# Patient Record
Sex: Male | Born: 1940 | Race: White | Hispanic: No | Marital: Married | Smoking: Former smoker
Health system: Southern US, Community
[De-identification: ages and names within clinical notes are randomized; demographics above are authoritative.]

## PROBLEM LIST (undated history)

## (undated) DIAGNOSIS — E119 Type 2 diabetes mellitus without complications: Secondary | ICD-10-CM

## (undated) DIAGNOSIS — N200 Calculus of kidney: Secondary | ICD-10-CM

## (undated) DIAGNOSIS — Z85828 Personal history of other malignant neoplasm of skin: Secondary | ICD-10-CM

## (undated) DIAGNOSIS — E785 Hyperlipidemia, unspecified: Secondary | ICD-10-CM

## (undated) DIAGNOSIS — G4730 Sleep apnea, unspecified: Secondary | ICD-10-CM

## (undated) DIAGNOSIS — I472 Ventricular tachycardia, unspecified: Secondary | ICD-10-CM

## (undated) DIAGNOSIS — M353 Polymyalgia rheumatica: Secondary | ICD-10-CM

## (undated) DIAGNOSIS — I4729 Other ventricular tachycardia: Secondary | ICD-10-CM

## (undated) DIAGNOSIS — I5022 Chronic systolic (congestive) heart failure: Secondary | ICD-10-CM

## (undated) DIAGNOSIS — Z95810 Presence of automatic (implantable) cardiac defibrillator: Secondary | ICD-10-CM

## (undated) DIAGNOSIS — I2510 Atherosclerotic heart disease of native coronary artery without angina pectoris: Secondary | ICD-10-CM

## (undated) DIAGNOSIS — I255 Ischemic cardiomyopathy: Secondary | ICD-10-CM

## (undated) DIAGNOSIS — I499 Cardiac arrhythmia, unspecified: Secondary | ICD-10-CM

## (undated) DIAGNOSIS — M1990 Unspecified osteoarthritis, unspecified site: Secondary | ICD-10-CM

## (undated) DIAGNOSIS — I4819 Other persistent atrial fibrillation: Secondary | ICD-10-CM

## (undated) DIAGNOSIS — L570 Actinic keratosis: Secondary | ICD-10-CM

## (undated) HISTORY — DX: Hyperlipidemia, unspecified: E78.5

## (undated) HISTORY — DX: Ventricular tachycardia, unspecified: I47.20

## (undated) HISTORY — DX: Personal history of other malignant neoplasm of skin: Z85.828

## (undated) HISTORY — PX: JOINT REPLACEMENT: SHX530

## (undated) HISTORY — DX: Actinic keratosis: L57.0

## (undated) HISTORY — PX: OTHER SURGICAL HISTORY: SHX169

## (undated) HISTORY — DX: Calculus of kidney: N20.0

## (undated) HISTORY — DX: Chronic systolic (congestive) heart failure: I50.22

## (undated) HISTORY — DX: Sleep apnea, unspecified: G47.30

## (undated) HISTORY — DX: Other persistent atrial fibrillation: I48.19

## (undated) HISTORY — DX: Ischemic cardiomyopathy: I25.5

## (undated) HISTORY — DX: Other ventricular tachycardia: I47.29

## (undated) HISTORY — DX: Atherosclerotic heart disease of native coronary artery without angina pectoris: I25.10

## (undated) HISTORY — DX: Polymyalgia rheumatica: M35.3

## (undated) HISTORY — DX: Ventricular tachycardia: I47.2

## (undated) SURGERY — LEFT HEART CATH AND CORONARY ANGIOGRAPHY
Anesthesia: Moderate Sedation

---

## 1969-07-28 DIAGNOSIS — N200 Calculus of kidney: Secondary | ICD-10-CM

## 1969-07-28 HISTORY — DX: Calculus of kidney: N20.0

## 1992-11-27 HISTORY — PX: CORONARY ARTERY BYPASS GRAFT: SHX141

## 2006-03-30 ENCOUNTER — Ambulatory Visit: Payer: Self-pay | Admitting: Unknown Physician Specialty

## 2008-07-29 ENCOUNTER — Ambulatory Visit: Payer: Self-pay

## 2008-08-28 ENCOUNTER — Other Ambulatory Visit: Payer: Self-pay

## 2008-08-28 ENCOUNTER — Ambulatory Visit: Payer: Self-pay | Admitting: General Practice

## 2008-09-02 ENCOUNTER — Ambulatory Visit: Payer: Self-pay | Admitting: General Practice

## 2009-11-27 HISTORY — PX: CATARACT EXTRACTION W/ INTRAOCULAR LENS  IMPLANT, BILATERAL: SHX1307

## 2009-11-27 HISTORY — PX: TOTAL KNEE ARTHROPLASTY: SHX125

## 2009-11-29 ENCOUNTER — Ambulatory Visit: Payer: Self-pay | Admitting: General Practice

## 2009-12-13 ENCOUNTER — Inpatient Hospital Stay: Payer: Self-pay | Admitting: General Practice

## 2010-06-23 ENCOUNTER — Ambulatory Visit: Payer: Self-pay

## 2010-08-08 ENCOUNTER — Ambulatory Visit: Payer: Self-pay | Admitting: General Practice

## 2010-11-27 HISTORY — PX: KNEE ARTHROSCOPY: SHX127

## 2012-02-06 ENCOUNTER — Encounter: Payer: Self-pay | Admitting: *Deleted

## 2012-02-06 ENCOUNTER — Telehealth: Payer: Self-pay | Admitting: Internal Medicine

## 2012-02-06 NOTE — Telephone Encounter (Signed)
New msg Pt called and wanted to know what records he needs to bring to appt on thursday

## 2012-02-06 NOTE — Telephone Encounter (Signed)
I spoke with the patient. He states Dr. Darrold Junker is referring him to Dr. Graciela Husbands. He states he has had stress testing and an echo within the last couple of months. I explained to the patient I would forward this message to Asher Muir to see if she has records on the patient and make sure these are included along with an office note and EKG.

## 2012-02-07 NOTE — Telephone Encounter (Signed)
We have records from [facility] already. He doesn't need to bring anything. If we need something more will get that while he is here.

## 2012-02-08 ENCOUNTER — Encounter: Payer: Self-pay | Admitting: Internal Medicine

## 2012-02-08 ENCOUNTER — Encounter: Payer: Self-pay | Admitting: *Deleted

## 2012-02-08 ENCOUNTER — Ambulatory Visit (INDEPENDENT_AMBULATORY_CARE_PROVIDER_SITE_OTHER): Payer: Medicare Other | Admitting: Internal Medicine

## 2012-02-08 VITALS — BP 110/68 | HR 78 | Ht 70.0 in | Wt 223.0 lb

## 2012-02-08 DIAGNOSIS — I5022 Chronic systolic (congestive) heart failure: Secondary | ICD-10-CM | POA: Insufficient documentation

## 2012-02-08 DIAGNOSIS — I509 Heart failure, unspecified: Secondary | ICD-10-CM

## 2012-02-08 DIAGNOSIS — I5023 Acute on chronic systolic (congestive) heart failure: Secondary | ICD-10-CM | POA: Insufficient documentation

## 2012-02-08 DIAGNOSIS — I255 Ischemic cardiomyopathy: Secondary | ICD-10-CM

## 2012-02-08 DIAGNOSIS — I2510 Atherosclerotic heart disease of native coronary artery without angina pectoris: Secondary | ICD-10-CM

## 2012-02-08 DIAGNOSIS — R002 Palpitations: Secondary | ICD-10-CM | POA: Insufficient documentation

## 2012-02-08 DIAGNOSIS — I2589 Other forms of chronic ischemic heart disease: Secondary | ICD-10-CM

## 2012-02-08 NOTE — Assessment & Plan Note (Signed)
As above   he describes these as regular they are not likely atrial fibrillation but they certainly could be. We will need to follow this.

## 2012-02-08 NOTE — Progress Notes (Signed)
History and Physical  Patient ID: Andres [name] MRN: [id number], SOB: September 25, 1941 71 y.o. Date of Encounter: 02/08/2012, 3:02 PM  Primary Physician: Alonna Buckler, MD, MD Primary Cardiologist: AP Primary Electrophysiologist:  SK  Chief Complaint: ?ICD  History of Present Illness: Andres [name] is a 71 y.o. male with a history of coronary artery disease with bypass surgery undertaken urgently in 1994 following dissection during PCI. He has not had a catheterization since.  He has moderate left ventricular dysfunction and has had ejection fraction of 30-35% over the last year or so. He had a Myoview 2 years ago demonstrating a large scar but no ischemia.  He has class II symptoms. He is short of breath climbing a flight half of stairs; he does not have nocturnal dyspnea orthopnea or peripheral edema. He denies chest pain.  He is to get obstructive sleep apnea.  He has had problems with tachycardia palpitations associated with some shortness of breath but not lightheadedness. These are abrupt in onset as well as offset. He has not had syncope. He does have orthostatic lightheadedness.  He has no history of atrial fibrillation. He denies prior stroke.  QRS duration is short.     Past Medical History  Diagnosis Date  . Hyperlipidemia   . Diabetes mellitus   . Sleep apnea   . Coronary artery disease      Past Surgical History  Procedure Date  . Coronary artery bypass graft     [facility] 1994  . Knee surgery 2011    left knee surgery       Current Outpatient Prescriptions  Medication Sig Dispense Refill  . acetaminophen (TYLENOL) 325 MG tablet Take 325 mg by mouth every 6 (six) hours as needed.      [name] aspirin 325 MG tablet Take 325 mg by mouth daily.      . Cinnamon 500 MG TABS Take by mouth.      . Coenzyme Q10 (CO Q 10 PO) Take 300 mg by mouth daily.      . folic acid (FOLVITE) 1 MG tablet Take 1 mg by mouth daily.      [name] GARLIC PO Take by mouth.      .  Glucosamine-Chondroit-Vit C-Mn (GLUCOSAMINE CHONDR 1500 COMPLX PO) Take by mouth.      [name] ibuprofen (ADVIL,MOTRIN) 200 MG tablet Take 200 mg by mouth every 6 (six) hours as needed.      . insulin regular (NOVOLIN R,HUMULIN R) 100 units/mL injection Inject into the skin 2 (two) times daily before a meal.      . loratadine (CLARITIN) 10 MG tablet Take 10 mg by mouth daily.      . metFORMIN (GLUCOPHAGE) 500 MG tablet Take 1,000 mg by mouth 2 (two) times daily with a meal.       . Multiple Vitamin (MULTIVITAMIN) capsule Take 1 capsule by mouth daily.      . Omega-3 Fatty Acids (FISH OIL) 1000 MG CAPS Take 1,000 mg by mouth 2 (two) times daily.      . predniSONE (STERAPRED UNI-PAK) 10 MG tablet 15 tablet taper      . ramipril (ALTACE) 10 MG capsule Take 10 mg by mouth daily.      . rosuvastatin (CRESTOR) 5 MG tablet Take 5 mg by mouth daily.         Allergies: No Known Allergies   History  Substance Use Topics  . Smoking status: Former Smoker -- 1.0 packs/day for 25 years  Types: Cigarettes    Quit date: 01/09/1988  . Smokeless tobacco: Never Used   Comment: quit 1994  . Alcohol Use: No      Family History  Problem Relation Age of Onset  . Heart attack Father       ROS:  Please see the history of present illness.     All other systems reviewed and negative.   Vital Signs: Blood pressure 110/68, pulse 78, height 5\' 10"  (1.778 m), weight 223 lb (101.152 kg).  PHYSICAL EXAM: General:  Well nourished, obese Caucasian male in no acute distress appearing his stated age  HEENT: normal Lymph: no adenopathy Neck: no JVD Endocrine:  No thryomegaly Vascular: No carotid bruits; FA pulses 2+ bilaterally without bruits Cardiac:  normal S1, S2; RRR; 2/6 murur LLSB Back: without kyphosis/scoliosis, no CVA tenderness Lungs:  clear to auscultation bilaterally, no wheezing, rhonchi or rales Abd: soft, nontender, no hepatomegaly Ext: no edema Musculoskeletal:  No deformities, BUE and BLE  strength normal and equal Skin: warm and dry Neuro:  CNs 2-12 intact, no focal abnormalities noted Psych:  Normal affect   EKG:  Sinus with narrow QRS  Labs:   No results found for this basename: WBC, HGB, HCT, MCV, PLT   No results found for this basename: NA,K,CL,CO2,BUN,CREATININE,CALCIUM,LABALBU,PROT,BILITOT,ALKPHOS,ALT,AST,GLUCOSE in the last 168 hours No results found for this basename: CKTOTAL:4,CKMB:4,TROPONINI:4 in the last 72 hours No results found for this basename: CHOL, HDL, LDLCALC, TRIG   No results found for this basename: DDIMER   BNP No results found for this basename: probnp       ASSESSMENT AND PLAN:

## 2012-02-08 NOTE — Assessment & Plan Note (Signed)
The patient has ischemic cardiomyopathy. He has not undergone catheterization since his bypass with a vein graft 20 years ago and he also had residual 20% disease at that time. I should note that until today, the patient was unaware of prior myocardial infarction as evidenced on his Myoview scan.  However, prior to ICD implantation, there are 3 other issues. The first is he has not been treated with beta blockers or Aldactone. Both of these would be appropriate guidelines directed therapies to see if there is interval improvement in heart muscle performance that might mitigate the need for primary prevention ICD. He knows that this will take 3-6 months wait  .  The second issue is the need to explore revascularization adequacy. To that end we'll pursue catheterization not withstanding a negative Myoview scan 2 years ago. We have discussed risks and benefits. The third issue is more troubling. He has a history of abrupt onset onset palpitations. In the context of his myocardial infarction which is described as a large scar the possibility that this represents ventricular tachycardia must be explored. A little physiological testing to see if he had inducible ventricular tachycardia is appropriate. Will plan to pursue this following catheterization. In the event that it is positive he'll undergo ICD implantation. In the event that it is negative he will continue on medical therapy with reassessment of left ventricular function and to assess later whether he would be appropriately considered for primary prevention ICD. We reviewed the above and extreme detail

## 2012-02-08 NOTE — Patient Instructions (Addendum)
Your physician has requested that you have a cardiac catheterization @ THE [facility] SAME DAY EP STUDY AS WELL PER DR. KLEIN. Cardiac catheterization is used to diagnose and/or treat various heart conditions. Doctors may recommend this procedure for a number of different reasons. The most common reason is to evaluate chest pain. Chest pain can be a symptom of coronary artery disease (CAD), and cardiac catheterization can show whether plaque is narrowing or blocking your heart's arteries. This procedure is also used to evaluate the valves, as well as measure the blood flow and oxygen levels in different parts of your heart. For further information please visit [email or web address] Please follow instruction sheet, as given.  Your physician has recommended you make the following change in your medication: COREG 3.25 MG 1 TABLET TWICE DAILY  Your physician recommends that you schedule a follow-up appointment in: WITH DR. Fidela Juneau    Your physician recommends that you return for lab work in: 02/22/12 FOR PRE CATH LABS TO BE DONE @ [facility] OFFICE

## 2012-02-08 NOTE — Assessment & Plan Note (Signed)
As above; is currently class II.

## 2012-02-12 ENCOUNTER — Telehealth: Payer: Self-pay | Admitting: Internal Medicine

## 2012-02-12 NOTE — Telephone Encounter (Signed)
Pt was to have a heart med , a beta blocker called into [facility] in [place], during office 02-08-12, pls call [phone] when done

## 2012-02-13 ENCOUNTER — Other Ambulatory Visit: Payer: Self-pay | Admitting: *Deleted

## 2012-02-13 MED ORDER — CARVEDILOL 3.125 MG PO TABS: 3.1250 mg | ORAL_TABLET | Freq: Two times a day (BID) | ORAL | Status: DC

## 2012-02-16 ENCOUNTER — Encounter (HOSPITAL_COMMUNITY): Payer: Self-pay | Admitting: Pharmacy Technician

## 2012-02-22 ENCOUNTER — Other Ambulatory Visit: Payer: Medicare Other

## 2012-02-22 DIAGNOSIS — I2510 Atherosclerotic heart disease of native coronary artery without angina pectoris: Secondary | ICD-10-CM

## 2012-02-23 LAB — CBC WITH DIFFERENTIAL/PLATELET
Basos: 1 % (ref 0–3)
Hemoglobin: 15 g/dL (ref 12.6–17.7)
Lymphs: 42 % (ref 14–46)
Monocytes: 8 % (ref 4–13)
Neutrophils Absolute: 4.6 10*3/uL (ref 1.8–7.8)
RBC: 4.8 x10E6/uL (ref 4.14–5.80)

## 2012-02-23 LAB — BASIC METABOLIC PANEL
BUN/Creatinine Ratio: 18 (ref 10–22)
BUN: 22 mg/dL (ref 8–27)
CO2: 20 mmol/L (ref 20–32)
Calcium: 9 mg/dL (ref 8.6–10.2)
Creatinine, Ser: 1.23 mg/dL (ref 0.76–1.27)

## 2012-02-23 LAB — PROTIME-INR
INR: 1 (ref 0.8–1.2)
Prothrombin Time: 10.7 s (ref 9.1–12.0)

## 2012-02-27 ENCOUNTER — Other Ambulatory Visit: Payer: Self-pay | Admitting: Internal Medicine

## 2012-02-27 DIAGNOSIS — I428 Other cardiomyopathies: Secondary | ICD-10-CM

## 2012-02-27 MED ORDER — SODIUM CHLORIDE 0.9 % IR SOLN
80.0000 mg | Status: DC
  Filled 2012-02-27 (×2): qty 2

## 2012-02-27 MED ORDER — CHLORHEXIDINE GLUCONATE 4 % EX LIQD
60.0000 mL | Freq: Once | CUTANEOUS | Status: DC
  Filled 2012-02-27: qty 60

## 2012-02-27 MED ORDER — CEFAZOLIN SODIUM-DEXTROSE 2-3 GM-% IV SOLR
2.0000 g | INTRAVENOUS | Status: DC
  Filled 2012-02-27 (×2): qty 50

## 2012-02-28 ENCOUNTER — Encounter (HOSPITAL_COMMUNITY)
Admission: RE | Disposition: A | Discharge status: Home / Self Care | Payer: Self-pay | Source: Ambulatory Visit | Attending: Cardiovascular Disease

## 2012-02-28 ENCOUNTER — Inpatient Hospital Stay (HOSPITAL_COMMUNITY)
Admission: RE | Admit: 2012-02-28 | Discharge: 2012-03-01 | DRG: 225 | Disposition: A | Discharge status: Home / Self Care | Payer: Medicare Other | Source: Ambulatory Visit | Attending: Cardiovascular Disease | Admitting: Cardiovascular Disease

## 2012-02-28 ENCOUNTER — Encounter (HOSPITAL_COMMUNITY): Payer: Self-pay | Admitting: General Practice

## 2012-02-28 DIAGNOSIS — I472 Ventricular tachycardia, unspecified: Secondary | ICD-10-CM

## 2012-02-28 DIAGNOSIS — I428 Other cardiomyopathies: Secondary | ICD-10-CM

## 2012-02-28 DIAGNOSIS — Z951 Presence of aortocoronary bypass graft: Secondary | ICD-10-CM

## 2012-02-28 DIAGNOSIS — I2510 Atherosclerotic heart disease of native coronary artery without angina pectoris: Secondary | ICD-10-CM

## 2012-02-28 DIAGNOSIS — I4729 Other ventricular tachycardia: Secondary | ICD-10-CM

## 2012-02-28 DIAGNOSIS — Z794 Long term (current) use of insulin: Secondary | ICD-10-CM

## 2012-02-28 DIAGNOSIS — Z8249 Family history of ischemic heart disease and other diseases of the circulatory system: Secondary | ICD-10-CM

## 2012-02-28 DIAGNOSIS — E119 Type 2 diabetes mellitus without complications: Secondary | ICD-10-CM | POA: Diagnosis present

## 2012-02-28 DIAGNOSIS — Z01812 Encounter for preprocedural laboratory examination: Secondary | ICD-10-CM

## 2012-02-28 DIAGNOSIS — Z87891 Personal history of nicotine dependence: Secondary | ICD-10-CM

## 2012-02-28 DIAGNOSIS — I5022 Chronic systolic (congestive) heart failure: Secondary | ICD-10-CM | POA: Diagnosis present

## 2012-02-28 DIAGNOSIS — I2589 Other forms of chronic ischemic heart disease: Principal | ICD-10-CM | POA: Diagnosis present

## 2012-02-28 DIAGNOSIS — G4733 Obstructive sleep apnea (adult) (pediatric): Secondary | ICD-10-CM | POA: Diagnosis present

## 2012-02-28 DIAGNOSIS — I4891 Unspecified atrial fibrillation: Secondary | ICD-10-CM | POA: Diagnosis present

## 2012-02-28 DIAGNOSIS — E785 Hyperlipidemia, unspecified: Secondary | ICD-10-CM | POA: Diagnosis present

## 2012-02-28 DIAGNOSIS — I509 Heart failure, unspecified: Secondary | ICD-10-CM | POA: Diagnosis present

## 2012-02-28 HISTORY — PX: LEFT HEART CATHETERIZATION WITH CORONARY/GRAFT ANGIOGRAM: SHX5450

## 2012-02-28 HISTORY — PX: CARDIAC CATHETERIZATION: SHX172

## 2012-02-28 HISTORY — DX: Presence of automatic (implantable) cardiac defibrillator: Z95.810

## 2012-02-28 HISTORY — PX: ELECTROPHYSIOLOGY STUDY: SHX5467

## 2012-02-28 HISTORY — PX: INSERT / REPLACE / REMOVE PACEMAKER: SUR710

## 2012-02-28 HISTORY — DX: Type 2 diabetes mellitus without complications: E11.9

## 2012-02-28 LAB — CBC
HCT: 42.1 % (ref 39.0–52.0)
RBC: 4.51 MIL/uL (ref 4.22–5.81)
RDW: 14.9 % (ref 11.5–15.5)
WBC: 9.3 10*3/uL (ref 4.0–10.5)

## 2012-02-28 LAB — GLUCOSE, CAPILLARY
Glucose-Capillary: 192 mg/dL — ABNORMAL HIGH (ref 70–99)
Glucose-Capillary: 197 mg/dL — ABNORMAL HIGH (ref 70–99)

## 2012-02-28 LAB — BASIC METABOLIC PANEL
BUN: 19 mg/dL (ref 6–23)
CO2: 25 mEq/L (ref 19–32)
Chloride: 103 mEq/L (ref 96–112)
GFR calc Af Amer: 71 mL/min — ABNORMAL LOW (ref >90)
Potassium: 4.3 mEq/L (ref 3.5–5.1)

## 2012-02-28 LAB — SURGICAL PCR SCREEN: MRSA, PCR: NEGATIVE

## 2012-02-28 SURGERY — ELECTROPHYSIOLOGY STUDY
Anesthesia: LOCAL

## 2012-02-28 SURGERY — LEFT HEART CATHETERIZATION WITH CORONARY/GRAFT ANGIOGRAM
Anesthesia: LOCAL

## 2012-02-28 MED ORDER — EZETIMIBE 10 MG PO TABS
10.0000 mg | ORAL_TABLET | Freq: Every day | ORAL | Status: DC
  Administered 2012-02-28 – 2012-03-01 (×3): 10 mg via ORAL
  Filled 2012-02-28 (×3): qty 1

## 2012-02-28 MED ORDER — FENTANYL CITRATE 0.05 MG/ML IJ SOLN
INTRAMUSCULAR | Status: AC
  Filled 2012-02-28: qty 2

## 2012-02-28 MED ORDER — ATORVASTATIN CALCIUM 40 MG PO TABS
40.0000 mg | ORAL_TABLET | Freq: Every day | ORAL | Status: DC
  Administered 2012-02-28 – 2012-02-29 (×2): 40 mg via ORAL
  Filled 2012-02-28 (×3): qty 1

## 2012-02-28 MED ORDER — LIDOCAINE HCL (PF) 1 % IJ SOLN
INTRAMUSCULAR | Status: AC
  Filled 2012-02-28: qty 30

## 2012-02-28 MED ORDER — INSULIN ASPART 100 UNIT/ML ~~LOC~~ SOLN
17.0000 [IU] | Freq: Every day | SUBCUTANEOUS | Status: DC
  Administered 2012-02-29 – 2012-03-01 (×2): 17 [IU] via SUBCUTANEOUS

## 2012-02-28 MED ORDER — PREDNISONE (PAK) 10 MG PO TABS: 15.0000 mg | ORAL_TABLET | Freq: Every day | ORAL | Status: DC

## 2012-02-28 MED ORDER — LIDOCAINE HCL (PF) 1 % IJ SOLN
INTRAMUSCULAR | Status: AC
  Filled 2012-02-28: qty 60

## 2012-02-28 MED ORDER — SODIUM CHLORIDE 0.9 % IV SOLN
INTRAVENOUS | Status: DC
  Administered 2012-02-28: 1000 mL via INTRAVENOUS

## 2012-02-28 MED ORDER — INSULIN REGULAR HUMAN 100 UNIT/ML IJ SOLN: 7.0000 [IU] | Freq: Two times a day (BID) | INTRAMUSCULAR | Status: DC

## 2012-02-28 MED ORDER — SODIUM CHLORIDE 0.9 % IV SOLN: INTRAVENOUS | Status: DC

## 2012-02-28 MED ORDER — ONDANSETRON HCL 4 MG/2ML IJ SOLN: 4.0000 mg | Freq: Four times a day (QID) | INTRAMUSCULAR | Status: DC | PRN

## 2012-02-28 MED ORDER — PREDNISONE 20 MG PO TABS
20.0000 mg | ORAL_TABLET | Freq: Every day | ORAL | Status: DC
  Filled 2012-02-28 (×2): qty 1

## 2012-02-28 MED ORDER — HEPARIN (PORCINE) IN NACL 2-0.9 UNIT/ML-% IJ SOLN
INTRAMUSCULAR | Status: AC
  Filled 2012-02-28: qty 2000

## 2012-02-28 MED ORDER — MUPIROCIN 2 % EX OINT
TOPICAL_OINTMENT | Freq: Two times a day (BID) | CUTANEOUS | Status: DC
  Administered 2012-02-28: 1 via NASAL
  Administered 2012-02-28: 23:00:00 via NASAL
  Administered 2012-02-29: 1 via NASAL
  Administered 2012-02-29 – 2012-03-01 (×2): via NASAL
  Filled 2012-02-28 (×2): qty 22

## 2012-02-28 MED ORDER — MUPIROCIN 2 % EX OINT
TOPICAL_OINTMENT | CUTANEOUS | Status: AC
  Administered 2012-02-28: 1 via NASAL
  Filled 2012-02-28: qty 22

## 2012-02-28 MED ORDER — CEFAZOLIN SODIUM 1-5 GM-% IV SOLN
1.0000 g | Freq: Four times a day (QID) | INTRAVENOUS | Status: AC
Start: 2012-02-28 — End: 2012-02-29
  Administered 2012-02-28 – 2012-02-29 (×3): 1 g via INTRAVENOUS
  Filled 2012-02-28 (×3): qty 50

## 2012-02-28 MED ORDER — FOLIC ACID 1 MG PO TABS
1.0000 mg | ORAL_TABLET | Freq: Every day | ORAL | Status: DC
  Administered 2012-02-28 – 2012-03-01 (×3): 1 mg via ORAL
  Filled 2012-02-28 (×3): qty 1

## 2012-02-28 MED ORDER — LORATADINE 10 MG PO TABS
10.0000 mg | ORAL_TABLET | Freq: Every day | ORAL | Status: DC
  Administered 2012-02-28 – 2012-03-01 (×3): 10 mg via ORAL
  Filled 2012-02-28 (×3): qty 1

## 2012-02-28 MED ORDER — SODIUM CHLORIDE 0.9 % IV SOLN: 250.0000 mL | INTRAVENOUS | Status: DC | PRN

## 2012-02-28 MED ORDER — MIDAZOLAM HCL 5 MG/5ML IJ SOLN
INTRAMUSCULAR | Status: AC
  Filled 2012-02-28: qty 5

## 2012-02-28 MED ORDER — NITROGLYCERIN 0.2 MG/ML ON CALL CATH LAB
INTRAVENOUS | Status: AC
  Filled 2012-02-28: qty 1

## 2012-02-28 MED ORDER — SODIUM CHLORIDE 0.9 % IJ SOLN: 3.0000 mL | INTRAMUSCULAR | Status: DC | PRN

## 2012-02-28 MED ORDER — CARVEDILOL 3.125 MG PO TABS
3.1250 mg | ORAL_TABLET | Freq: Two times a day (BID) | ORAL | Status: DC
  Administered 2012-02-28 – 2012-02-29 (×2): 3.125 mg via ORAL
  Filled 2012-02-28 (×4): qty 1

## 2012-02-28 MED ORDER — ACETAMINOPHEN 325 MG PO TABS: 650.0000 mg | ORAL_TABLET | ORAL | Status: DC | PRN

## 2012-02-28 MED ORDER — ASPIRIN 325 MG PO TABS
325.0000 mg | ORAL_TABLET | Freq: Every day | ORAL | Status: DC
  Administered 2012-02-28 – 2012-02-29 (×2): 325 mg via ORAL
  Filled 2012-02-28 (×2): qty 1

## 2012-02-28 MED ORDER — SODIUM CHLORIDE 0.9 % IJ SOLN: 3.0000 mL | Freq: Two times a day (BID) | INTRAMUSCULAR | Status: DC

## 2012-02-28 MED ORDER — INSULIN ASPART 100 UNIT/ML ~~LOC~~ SOLN
7.0000 [IU] | Freq: Every day | SUBCUTANEOUS | Status: DC
  Administered 2012-02-28 – 2012-02-29 (×2): 7 [IU] via SUBCUTANEOUS

## 2012-02-28 MED ORDER — PREDNISONE 10 MG PO TABS
10.0000 mg | ORAL_TABLET | Freq: Every day | ORAL | Status: DC
  Administered 2012-02-29 – 2012-03-01 (×2): 10 mg via ORAL
  Filled 2012-02-28 (×3): qty 1

## 2012-02-28 MED ORDER — MIDAZOLAM HCL 2 MG/2ML IJ SOLN
INTRAMUSCULAR | Status: AC
  Filled 2012-02-28: qty 2

## 2012-02-28 MED ORDER — RAMIPRIL 10 MG PO CAPS
10.0000 mg | ORAL_CAPSULE | Freq: Every day | ORAL | Status: DC
  Administered 2012-02-28 – 2012-03-01 (×3): 10 mg via ORAL
  Filled 2012-02-28 (×3): qty 1

## 2012-02-28 MED ORDER — ASPIRIN 81 MG PO CHEW: 324.0000 mg | CHEWABLE_TABLET | ORAL | Status: DC

## 2012-02-28 MED ORDER — ACETAMINOPHEN 325 MG PO TABS: 325.0000 mg | ORAL_TABLET | ORAL | Status: DC | PRN

## 2012-02-28 MED ORDER — SODIUM CHLORIDE 0.9 % IV SOLN
INTRAVENOUS | Status: AC
  Administered 2012-02-28: 15:00:00 via INTRAVENOUS

## 2012-02-28 NOTE — CV Procedure (Signed)
    Cardiac Catheterization Procedure Note  Name: Andres [name] MRN: [id number] DOB: [birth year]  Procedure: Left Heart Cath, Selective Coronary Angiography, LV angiography  Indication: Dyspnea, cardiomyopathy and congestive heart failure.   Medications:  Sedation:  1 mg IV Versed, 25 mcg IV Fentanyl  Contrast:  80 ML Omnipaque  Procedural details: The right groin was prepped, draped, and anesthetized with 1% lidocaine. Using modified Seldinger technique, a 5 French sheath was introduced into the right femoral artery. Standard Judkins catheters were used for coronary angiography and left ventriculography. Catheter exchanges were performed over a guidewire. There were no immediate procedural complications. The patient was transferred to the post catheterization recovery area for further monitoring.   Procedural Findings:  Hemodynamics: AO:  110/59   mmHg LV:  113/7    mmHg LVEDP: 15  mmHg  Coronary angiography: Coronary dominance: Right   Left Main:  Normal in size and short. It is free of significant disease.  Left Anterior Descending (LAD):  Normal in size with minor irregularities throughout its course without evidence of obstructive disease.  1st diagonal (D1):  Large in size and gives a second branch that supplies a second diagonal distribution. In the lower branch there is a 40% ostial lesion.*  Circumflex (LCx):  Normal in size and nondominant. There is mild atherosclerosis proximally. OM1 is normal in size with minor irregularities. Right after OM1 is a 70% stenosis. The distal circumflex appears to be occluded but is supplied by a patent SVG graft.  Right Coronary Artery: Normal in size and dominant. It has minor irregularities without evidence of obstructive disease.  posterior descending artery: Normal in size with minor irregularities.  posterior lateral branch:  No obstructive disease.  SVG to OM 3: Is patent with mild 20% proximal disease. There is a 40%  stenosis in the mid segment of the graft but otherwise no evidence of obstructive disease. This graft also supplies OM 2 and retrograde fashion.  Left ventriculography: Left ventricular systolic function is severely reduced , LVEF is estimated at 25 %, there is no significant mitral regurgitation   Final Conclusions:  1. Significant 1 vessel coronary artery disease with patent SVG to OM. 2. 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 Ejection fraction is estimated to be 25%. 3. Mildly elevated left ventricular end-diastolic pressure.  Recommendations:  Continue medical therapy for heart failure and CAD. Proceed with evaluation for an ICD placement.  Lorine Bears MD, [facility] 02/28/2012, 11:07 AM

## 2012-02-28 NOTE — H&P (View-Only) (Signed)
History and Physical  Patient ID: Andres [name] MRN: [id number], SOB: [birth year] 71 y.o. Date of Encounter: 02/08/2012, 3:02 PM  Primary Physician: Alonna Buckler, MD, MD Primary Cardiologist: AP Primary Electrophysiologist:  SK  Chief Complaint: ?ICD  History of Present Illness: Andres [name] is a 71 y.o. male with a history of coronary artery disease with bypass surgery undertaken urgently in 1994 following dissection during PCI. He has not had a catheterization since.  He has moderate left ventricular dysfunction and has had ejection fraction of 30-35% over the last year or so. He had a Myoview 2 years ago demonstrating a large scar but no ischemia.  He has class II symptoms. He is short of breath climbing a flight half of stairs; he does not have nocturnal dyspnea orthopnea or peripheral edema. He denies chest pain.  He is to get obstructive sleep apnea.  He has had problems with tachycardia palpitations associated with some shortness of breath but not lightheadedness. These are abrupt in onset as well as offset. He has not had syncope. He does have orthostatic lightheadedness.  He has no history of atrial fibrillation. He denies prior stroke.  QRS duration is short.     Past Medical History  Diagnosis Date  . Hyperlipidemia   . Diabetes mellitus   . Sleep apnea   . Coronary artery disease      Past Surgical History  Procedure Date  . Coronary artery bypass graft     [facility] 1994  . Knee surgery 2011    left knee surgery       Current Outpatient Prescriptions  Medication Sig Dispense Refill  . acetaminophen (TYLENOL) 325 MG tablet Take 325 mg by mouth every 6 (six) hours as needed.      [name] aspirin 325 MG tablet Take 325 mg by mouth daily.      . Cinnamon 500 MG TABS Take by mouth.      . Coenzyme Q10 (CO Q 10 PO) Take 300 mg by mouth daily.      . folic acid (FOLVITE) 1 MG tablet Take 1 mg by mouth daily.      [name] GARLIC PO Take by mouth.      .  Glucosamine-Chondroit-Vit C-Mn (GLUCOSAMINE CHONDR 1500 COMPLX PO) Take by mouth.      [name] ibuprofen (ADVIL,MOTRIN) 200 MG tablet Take 200 mg by mouth every 6 (six) hours as needed.      . insulin regular (NOVOLIN R,HUMULIN R) 100 units/mL injection Inject into the skin 2 (two) times daily before a meal.      . loratadine (CLARITIN) 10 MG tablet Take 10 mg by mouth daily.      . metFORMIN (GLUCOPHAGE) 500 MG tablet Take 1,000 mg by mouth 2 (two) times daily with a meal.       . Multiple Vitamin (MULTIVITAMIN) capsule Take 1 capsule by mouth daily.      . Omega-3 Fatty Acids (FISH OIL) 1000 MG CAPS Take 1,000 mg by mouth 2 (two) times daily.      . predniSONE (STERAPRED UNI-PAK) 10 MG tablet 15 tablet taper      . ramipril (ALTACE) 10 MG capsule Take 10 mg by mouth daily.      . rosuvastatin (CRESTOR) 5 MG tablet Take 5 mg by mouth daily.         Allergies: No Known Allergies   History  Substance Use Topics  . Smoking status: Former Smoker -- 1.0 packs/day for 25 years  Types: Cigarettes    Quit date: 01/09/1988  . Smokeless tobacco: Never Used   Comment: quit 1994  . Alcohol Use: No      Family History  Problem Relation Age of Onset  . Heart attack Father       ROS:  Please see the history of present illness.     All other systems reviewed and negative.   Vital Signs: Blood pressure 110/68, pulse 78, height 5\' 10"  (1.778 m), weight 223 lb (101.152 kg).  PHYSICAL EXAM: General:  Well nourished, obese Caucasian male in no acute distress appearing his stated age  HEENT: normal Lymph: no adenopathy Neck: no JVD Endocrine:  No thryomegaly Vascular: No carotid bruits; FA pulses 2+ bilaterally without bruits Cardiac:  normal S1, S2; RRR; 2/6 murur LLSB Back: without kyphosis/scoliosis, no CVA tenderness Lungs:  clear to auscultation bilaterally, no wheezing, rhonchi or rales Abd: soft, nontender, no hepatomegaly Ext: no edema Musculoskeletal:  No deformities, BUE and BLE  strength normal and equal Skin: warm and dry Neuro:  CNs 2-12 intact, no focal abnormalities noted Psych:  Normal affect   EKG:  Sinus with narrow QRS  Labs:   No results found for this basename: WBC, HGB, HCT, MCV, PLT   No results found for this basename: NA,K,CL,CO2,BUN,CREATININE,CALCIUM,LABALBU,PROT,BILITOT,ALKPHOS,ALT,AST,GLUCOSE in the last 168 hours No results found for this basename: CKTOTAL:4,CKMB:4,TROPONINI:4 in the last 72 hours No results found for this basename: CHOL, HDL, LDLCALC, TRIG   No results found for this basename: DDIMER   BNP No results found for this basename: probnp       ASSESSMENT AND PLAN:

## 2012-02-28 NOTE — Interval H&P Note (Signed)
History and Physical Interval Note:  02/28/2012 11:52 AM  Andres [name]  has presented today for surgery, with the diagnosis of cad  The various methods of treatment have been discussed with the patient and family. After consideration of risks, benefits and other options for treatment, the patient has consented to  Procedure(s) (LRB): ELECTROPHYSIOLOGY STUDY (N/A) as a surgical intervention .  The patients' history has been reviewed, patient examined, no change in status, stable for surgery.  I have reviewed the patients' chart and labs.  Questions were answered to the patient's satisfaction.     Sherryl Manges  Cath non obstructive  EF 25 % Foe EPS and +/- ICD

## 2012-02-28 NOTE — Interval H&P Note (Signed)
History and Physical Interval Note:  02/28/2012 10:38 AM  Andres [name]  has presented today for surgery, with the diagnosis of Chest pain  The various methods of treatment have been discussed with the patient and family. After consideration of risks, benefits and other options for treatment, the patient has consented to  Procedure(s) (LRB): LEFT HEART CATHETERIZATION WITH CORONARY/GRAFT ANGIOGRAM (N/A) as a surgical intervention .  The patients' history has been reviewed, patient examined, no change in status, stable for surgery.  I have reviewed the patients' chart and labs.  Questions were answered to the patient's satisfaction.     Lorine Bears

## 2012-02-28 NOTE — CV Procedure (Signed)
EPS dictation number [id number]  ICD dictation number [phone]

## 2012-02-29 ENCOUNTER — Other Ambulatory Visit: Payer: Self-pay

## 2012-02-29 ENCOUNTER — Ambulatory Visit (HOSPITAL_COMMUNITY): Payer: Medicare Other

## 2012-02-29 ENCOUNTER — Encounter (HOSPITAL_COMMUNITY): Payer: Self-pay | Admitting: Internal Medicine

## 2012-02-29 DIAGNOSIS — I428 Other cardiomyopathies: Secondary | ICD-10-CM

## 2012-02-29 LAB — GLUCOSE, CAPILLARY
Glucose-Capillary: 210 mg/dL — ABNORMAL HIGH (ref 70–99)
Glucose-Capillary: 336 mg/dL — ABNORMAL HIGH (ref 70–99)

## 2012-02-29 MED ORDER — ASPIRIN 325 MG PO TABS
162.0000 mg | ORAL_TABLET | Freq: Every day | ORAL | Status: DC
  Administered 2012-03-01: 162 mg via ORAL
  Filled 2012-02-29: qty 0.5

## 2012-02-29 MED ORDER — CARVEDILOL 6.25 MG PO TABS
6.2500 mg | ORAL_TABLET | Freq: Two times a day (BID) | ORAL | Status: DC
  Administered 2012-02-29 – 2012-03-01 (×2): 6.25 mg via ORAL
  Filled 2012-02-29 (×4): qty 1

## 2012-02-29 MED ORDER — METOPROLOL TARTRATE 1 MG/ML IV SOLN
5.0000 mg | INTRAVENOUS | Status: AC
  Administered 2012-02-29 (×2): 5 mg via INTRAVENOUS

## 2012-02-29 MED ORDER — METOPROLOL TARTRATE 1 MG/ML IV SOLN
INTRAVENOUS | Status: AC
  Administered 2012-02-29: 5 mg via INTRAVENOUS
  Filled 2012-02-29: qty 10

## 2012-02-29 NOTE — Op Note (Signed)
NAME:  Andres [name], Andres [name] NO.:  [id number]  MEDICAL RECORD NO.:  [id number]  LOCATION:  2020                         FACILITY:  [facility]  PHYSICIAN:  [name], MD, FACCDATE OF BIRTH:  Oct 28, 1941  DATE OF PROCEDURE: DATE OF DISCHARGE:                              OPERATIVE REPORT   PREOPERATIVE DIAGNOSES:  Ischemic cardiomyopathy.  Symptomatic tachy- palpitations.  POSTOPERATIVE DIAGNOSIS:  Inducible ventricular tachycardia.  PROCEDURE:  Invasive electrophysiological study.  DESCRIPTION OF PROCEDURE:  Following obtaining informed consent, the patient was brought to the electrophysiology laboratory and placed on the fluoroscopic table in a supine position.  After routine prep and drape, cardiac catheterization was performed via the left femoral vein. Following the procedure, the catheters were removed,  sheaths were removed, and the patient was prepared for ICD implantation in stable condition.  CATHETERS:  A 5-French Quadripolar catheter was inserted via the left femoral vein to the AV junction. A 5-French Quadripolar catheter was inserted via the left femoral vein to the right ventricular apex and the high right atrium.  Following insertion of the catheters, a stimulation protocol included: Incremental atrial pacing. Incremental ventricular pacing. Single ventricular extrastimuli at paced cycle length of 400:600 msec. Double and triple extrastimuli at paced cycle length of 550 msec and double extrastimuli paced cycle length of 400 msec from the right ventricular apex.  Surface leads I, aVF, and V1 were monitored continuously throughout the procedure.  RESULTS:  Rhythm:  Sinus.  The basic intervals are not available at the time of this dictation, I do recall that the H interval was 150 msec and the HV interval was 35 msec.  AV Wenckebach was 480 msec.  A-V nodal effective refractory period was 600 msec and 380 msec.  VA conduction was dissociated  at 600 msec.  Ventricular response to programed stimulation:  The ventricular effective refractory period was 550 msec, 640 msec, 400 msec, was 400 msec.  The closest coupling interval at the right ventricular apex of 550 msec was 260:210:210.  Arrhythmias induced the patient was induced in ventricular tachycardia that required shock termination.  It was accomplished at the aforementioned closest coupling interval, had a right bundle northwest axis morphology.  Cycle length was 220-240 msec.  IMPRESSION: 1. Normal sinus function. 2. Normal atrial function. 3. Normal atrioventricular nodal function. 4. Normal His-Purkinje system function. 5. No accessory pathway. 6. Easily inducible ventricular tachycardia in the setting of ischemic     cardiomyopathy.  Based on the fact the patient has symptomatic tachy-palpitations and inducible ventricular tachycardia, he will be admitted for ICD implantation.     [name], MD, [facility]     SCK/MEDQ  D:  02/28/2012  T:  02/29/2012  Job:  [id number]  cc:   Marcina Millard, M.D.

## 2012-02-29 NOTE — Progress Notes (Addendum)
Received a call from RN stating the patient went into atrial fibrillation. Reviewed telemetry and EKG which show A.Fib with rates 90-130s. Currently 100-120 with stable BP. No ICD shocks. Patient denies chest pain, sob, or palpitations, but states he feels somewhat hot. This is similar to what he has felt in the past. Discussed with Dr. Graciela Husbands. Will give 5mg  IV metoprolol now and repeat in 15 min for rate control. If he does not spontaneously convert overnight he will need cardioversion tomorrow. Will make NPO after midnight. Discussed with patient and family who are agreeable to the plan. Updated RN.  Ryleigh Esqueda PA-C 02/29/2012 5:15 PM

## 2012-02-29 NOTE — Op Note (Signed)
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  PROCEDURE:  Implantation of defibrillator with intraoperative defibrillation threshold testing.  Following EP study as dictated under separate cover, the patient was then prepped for ICD implantation given the patient's inducible ventricular tachycardia.  After routine prep and drape of the left upper chest, lidocaine was infiltrated in prepectoral subclavicular region.  An incision was made and carried down to the layer of the prepectoral fascia.  Using electrocautery and sharp dissection, a pocket was formed similarly. Hemostasis was obtained.  Thereafter, attention was turned to gain access to the extrathoracic left subclavian vein, which was accomplished without difficulty without the aspiration or puncture of the artery.  A venipuncture was accomplished.  An 8-French sheath was placed through which was then passed a [employer] Durata [id number] defibrillator lead, serial number [id number].  Under fluoroscopic guidance, it was manipulated to the right ventricular apex where the bipolar R-wave was 12.7 with a pace impedance of 887 and threshold of 1.1 V at 0.5 msec.  Current of threshold was 1.3 mA.  There was no diaphragmatic pacing at 10 V.  The current of injury was moderate.  The lead was then attached to a [employer] Fortify Assura device, model number [id number], serial number [id number].  Through the device, the bipolar R-wave was 11.7 with a pace impedance of 830, a threshold of 0.75 at 0.5 with  high-voltage impedance of 75 ohms.  The device was implanted.  The pocket was copiously irrigated with antibiotic-containing saline solution.  Hemostasis was assured.  The leads and pulse generator were placed in the pocket and secured to the prepectoral fascia.  The wound was closed in 3 layers in normal fashion. The wound was washed, dried, and a benzoin and Steri-Strip dressing was applied.  At this point, I stepped away and we then undertook defibrillation threshold testing.  Ventricular fibrillation was induced via the T-wave shock.  After total duration of 6 seconds, a 16.1 joule shock was delivered through measured resistance of 79 ohms, terminating ventricular fibrillation and restoring sinus rhythm.  The patient tolerated the procedure well without apparent complications.     [name], MD, [facility]     SCK/MEDQ  D:  02/28/2012  T:  02/29/2012  Job:  [id number]

## 2012-02-29 NOTE — Progress Notes (Signed)
   ELECTROPHYSIOLOGY ROUNDING NOTE    Patient Name: Andres [name] Date of Encounter: 02-29-2012    SUBJECTIVE:Patient feels well.  No chest pain or shortness of breath.  No incisional pain.  Status post EPS and ICD implantation 02-28-2012.  TELEMETRY: Reviewed telemetry pt in sinus rhythm Filed Vitals:   02/28/12 1430 02/28/12 1648 02/28/12 2031 02/29/12 0434  BP: 112/68 110/69 117/71 113/75  Pulse: 70 78 98 70  Temp: 98.2 F (36.8 C)  98.2 F (36.8 C) 97.2 F (36.2 C)  TempSrc: Oral  Oral Axillary  Resp: 16  18 18   Height:      Weight:      SpO2: 98%  93% 93%   Well developed and nourished in no acute distress HENT normal Neck supple with JVP-flat Clear Pocket without hematoma Regular rate and rhythm, no murmurs or rubs Abd-soft with active BS No Clubbing cyanosis edema Skin-warm and dry A & Oriented  Grossly normal sensory and motor function   Intake/Output Summary (Last 24 hours) at 02/29/12 0746 Last data filed at 02/28/12 2200  Gross per 24 hour  Intake    720 ml  Output   1201 ml  Net   -481 ml    LABS: Basic Metabolic Panel:  Basename 02/28/12 0728  NA 137  K 4.3  CL 103  CO2 25  GLUCOSE 199*  BUN 19  CREATININE 1.16  CALCIUM 8.9  MG --  PHOS --   CBC:  Basename 02/28/12 0729  WBC 9.3  NEUTROABS --  HGB 14.0  HCT 42.1  MCV 93.3  PLT 240    Radiology/Studies:   Tel: VT NS   CXR not yet done    DEVICE INTERROGATION: Device interrogated by industry.  Lead values including impedence, sensing, threshold within normal values.    Wound care, arm mobility, shock plan reviewed with patient.  Plan wound check and 3 month check with Dr Graciela Husbands in [place].  VTNS will follow for a few hours, may be related to lead position    Discharge plans  Increase coreg>>6.25 Decrease ASA 162

## 2012-02-29 NOTE — Progress Notes (Signed)
Inpatient Diabetes Program Recommendations  AACE/ADA: New Consensus Statement on Inpatient Glycemic Control  Target Ranges:  Prepandial:   less than 140 mg/dL      Peak postprandial:   less than 180 mg/dL (1-2 hours)      Critically ill patients:  140 - 180 mg/dL  Pager:  [phone] Hours:  8 am-10pm   Reason for Visit: Elevated glucose:  Results for Andres [name], Andres [name] (MRN [id number]) as of 02/29/2012 11:00  Ref. Range 02/28/2012 06:44 02/28/2012 11:11 02/29/2012 06:58  Glucose-Capillary Latest Range: 70-99 mg/dL 147 (H) 829 (H) 562 (H)    Inpatient Diabetes Program Recommendations Insulin - Basal: May consider basal insulin if fasting glucose remains elevated Correction (SSI): Add Novolog Correction

## 2012-03-01 ENCOUNTER — Encounter (HOSPITAL_COMMUNITY): Payer: Self-pay | Admitting: Anesthesiology

## 2012-03-01 ENCOUNTER — Encounter (HOSPITAL_COMMUNITY)
Admission: RE | Disposition: A | Discharge status: Home / Self Care | Payer: Self-pay | Source: Ambulatory Visit | Attending: Cardiovascular Disease

## 2012-03-01 ENCOUNTER — Other Ambulatory Visit: Payer: Self-pay

## 2012-03-01 ENCOUNTER — Inpatient Hospital Stay (HOSPITAL_COMMUNITY): Payer: Medicare Other | Admitting: Anesthesiology

## 2012-03-01 DIAGNOSIS — I4891 Unspecified atrial fibrillation: Secondary | ICD-10-CM

## 2012-03-01 HISTORY — PX: CARDIOVERSION: SHX1299

## 2012-03-01 LAB — GLUCOSE, CAPILLARY: Glucose-Capillary: 216 mg/dL — ABNORMAL HIGH (ref 70–99)

## 2012-03-01 SURGERY — CARDIOVERSION
Anesthesia: General | Wound class: Clean

## 2012-03-01 MED ORDER — SODIUM CHLORIDE 0.9 % IJ SOLN
3.0000 mL | Freq: Two times a day (BID) | INTRAMUSCULAR | Status: DC
  Administered 2012-03-01: 3 mL via INTRAVENOUS

## 2012-03-01 MED ORDER — SODIUM CHLORIDE 0.9 % IV SOLN
250.0000 mL | INTRAVENOUS | Status: DC
  Administered 2012-03-01: 250 mL via INTRAVENOUS

## 2012-03-01 MED ORDER — HYDROCORTISONE 1 % EX CREA
1.0000 "application " | TOPICAL_CREAM | Freq: Three times a day (TID) | CUTANEOUS | Status: DC | PRN
  Filled 2012-03-01: qty 28

## 2012-03-01 MED ORDER — CARVEDILOL 3.125 MG PO TABS: 6.2500 mg | ORAL_TABLET | Freq: Two times a day (BID) | ORAL | Status: DC

## 2012-03-01 MED ORDER — METFORMIN HCL 1000 MG PO TABS: 1000.0000 mg | ORAL_TABLET | Freq: Two times a day (BID) | ORAL | Status: DC

## 2012-03-01 MED ORDER — SODIUM CHLORIDE 0.9 % IJ SOLN: 3.0000 mL | INTRAMUSCULAR | Status: DC | PRN

## 2012-03-01 MED ORDER — PROPOFOL 10 MG/ML IV EMUL
INTRAVENOUS | Status: DC | PRN
  Administered 2012-03-01: 50 mg via INTRAVENOUS

## 2012-03-01 MED ORDER — ASPIRIN 81 MG PO TABS: 162.0000 mg | ORAL_TABLET | Freq: Every day | ORAL | Status: DC

## 2012-03-01 MED ORDER — SODIUM CHLORIDE 0.9 % IV SOLN
INTRAVENOUS | Status: DC | PRN
  Administered 2012-03-01 (×2): via INTRAVENOUS

## 2012-03-01 NOTE — Progress Notes (Signed)
   ELECTROPHYSIOLOGY ROUNDING NOTE    Patient Name: Andres [name] Date of Encounter: 03-01-2012    SUBJECTIVE: Patient feels well.  No chest pain or shortness of breath.  Not aware of afib.  Status post ICD implantation 02-28-2012  TELEMETRY: Reviewed telemetry pt in atrial fib with controlled ventricular response.  Filed Vitals:   02/29/12 1607 02/29/12 2020 03/01/12 0416 03/01/12 0638  BP: 123/72 97/60 107/64 103/62  Pulse:  80 72 73  Temp:  98 F (36.7 C) 97.9 F (36.6 C)   TempSrc:  Oral Oral   Resp: 20 20 20    Height:      Weight:      SpO2:  95% 93%     Intake/Output Summary (Last 24 hours) at 03/01/12 0831 Last data filed at 03/01/12 0700  Gross per 24 hour  Intake    960 ml  Output   2550 ml  Net  -1590 ml    LABS: Basic Metabolic Panel:  Basename 02/28/12 0728  NA 137  K 4.3  CL 103  CO2 25  GLUCOSE 199*  BUN 19  CREATININE 1.16  CALCIUM 8.9  MG --  PHOS --   CBC:  Basename 02/28/12 0729  WBC 9.3  NEUTROABS --  HGB 14.0  HCT 42.1  MCV 93.3  PLT 240     PHYSICAL EXAM Left chest without hematoma/ecchymosis.  Well developed and nourished in no acute distress HENT normal Neck supple with JVP-flat Clear Irregularly irregular rate and rhythm, no murmurs or gallops Abd-soft with active BS No Clubbing cyanosis edema Skin-warm and dry A & Oriented  Grossly normal sensory and motor function   A&P  Plan for DCCV  He is unaware of his afib, and so have instructed him in taking his pulse, both he and his wife can distinguish afib from NSR  With CHADSVASc score of 4 (age,vascular disease, dm. Cardiomyopathy) he will need long term anticoagulation  And will begin at outpt wound check  \

## 2012-03-01 NOTE — Anesthesia Postprocedure Evaluation (Signed)
  Anesthesia Post-op Note  Patient: Andres [name]  Procedure(s) Performed: Procedure(s) (LRB): CARDIOVERSION (N/A)  Patient Location: 2020  Anesthesia Type: General  Level of Consciousness: awake, alert , oriented and patient cooperative  Airway and Oxygen Therapy: Patient Spontanous Breathing and Patient connected to nasal cannula oxygen  Post-op Pain: none  Post-op Assessment: Post-op Vital signs reviewed, Patient's Cardiovascular Status Stable, Respiratory Function Stable, Patent Airway, No signs of Nausea or vomiting and Pain level controlled  Post-op Vital Signs: stable  Complications: No apparent anesthesia complications

## 2012-03-01 NOTE — Interval H&P Note (Signed)
History and Physical Interval Note:  03/01/2012 10:43 AM  Andres [name]  has presented today for surgery, with the diagnosis of afib  The various methods of treatment have been discussed with the patient and family. After consideration of risks, benefits and other options for treatment, the patient has consented to  Procedure(s) (LRB): CARDIOVERSION (N/A) as a surgical intervention .  The patients' history has been reviewed, patient examined, no change in status, stable for surgery.  I have reviewed the patients' chart and labs.  Questions were answered to the patient's satisfaction.     Sherryl Manges

## 2012-03-01 NOTE — Progress Notes (Signed)
Pt. Discharged 03/01/2012  1:30 PM Discharge instructions reviewed with patient/family. Patient/family verbalized understanding. All Rx's given. Questions answered as needed. Pt. Discharged to home with family/self.  Andres [name]

## 2012-03-01 NOTE — Progress Notes (Signed)
UR Completed. Simmons, Sun Kihn F [phone]  

## 2012-03-01 NOTE — Anesthesia Preprocedure Evaluation (Signed)
Anesthesia Evaluation Anesthesia Physical Anesthesia Plan  ASA: III  Anesthesia Plan: General   Post-op Pain Management:    Induction: Intravenous  Airway Management Planned: Mask  Additional Equipment:   Intra-op Plan:   Post-operative Plan:   Informed Consent: I have reviewed the patients History and Physical, chart, labs and discussed the procedure including the risks, benefits and alternatives for the proposed anesthesia with the patient or authorized representative who has indicated his/her understanding and acceptance.     Plan Discussed with: CRNA and Anesthesiologist  Anesthesia Plan Comments:         Anesthesia Quick Evaluation

## 2012-03-01 NOTE — Transfer of Care (Signed)
Immediate Anesthesia Transfer of Care Note  Patient: Andres [name]  Procedure(s) Performed: Procedure(s) (LRB): CARDIOVERSION (N/A)  Patient Location: 2020  Anesthesia Type: General  Level of Consciousness: awake, alert , oriented and patient cooperative  Airway & Oxygen Therapy: Patient Spontanous Breathing and Patient connected to nasal cannula oxygen  Post-op Assessment: Report given to PACU RN  Post vital signs: stable  Complications: No apparent anesthesia complications

## 2012-03-01 NOTE — H&P (View-Only) (Signed)
   ELECTROPHYSIOLOGY ROUNDING NOTE    Patient Name: Andres [name] Date of Encounter: 03-01-2012    SUBJECTIVE: Patient feels well.  No chest pain or shortness of breath.  Not aware of afib.  Status post ICD implantation 02-28-2012  TELEMETRY: Reviewed telemetry pt in atrial fib with controlled ventricular response.  Filed Vitals:   02/29/12 1607 02/29/12 2020 03/01/12 0416 03/01/12 0638  BP: 123/72 97/60 107/64 103/62  Pulse:  80 72 73  Temp:  98 F (36.7 C) 97.9 F (36.6 C)   TempSrc:  Oral Oral   Resp: 20 20 20   Height:      Weight:      SpO2:  95% 93%     Intake/Output Summary (Last 24 hours) at 03/01/12 0831 Last data filed at 03/01/12 0700  Gross per 24 hour  Intake    960 ml  Output   2550 ml  Net  -1590 ml    LABS: Basic Metabolic Panel:  Basename 02/28/12 0728  NA 137  K 4.3  CL 103  CO2 25  GLUCOSE 199*  BUN 19  CREATININE 1.16  CALCIUM 8.9  MG --  PHOS --   CBC:  Basename 02/28/12 0729  WBC 9.3  NEUTROABS --  HGB 14.0  HCT 42.1  MCV 93.3  PLT 240     PHYSICAL EXAM Left chest without hematoma/ecchymosis.  Well developed and nourished in no acute distress HENT normal Neck supple with JVP-flat Clear Irregularly irregular rate and rhythm, no murmurs or gallops Abd-soft with active BS No Clubbing cyanosis edema Skin-warm and dry A & Oriented  Grossly normal sensory and motor function   A&P  Plan for DCCV  He is unaware of his afib, and so have instructed him in taking his pulse, both he and his wife can distinguish afib from NSR  With CHADSVASc score of 4 (age,vascular disease, dm. Cardiomyopathy) he will need long term anticoagulation  And will begin at outpt wound check  \      

## 2012-03-01 NOTE — Discharge Instructions (Signed)
   Supplemental Discharge Instructions for  Pacemaker/Defibrillator Patients  Activity No heavy lifting or vigorous activity with your left/right arm for 6 to 8 weeks.  Do not raise your left/right arm above your head for one week.  Gradually raise your affected arm as drawn below.            03/03/12                             03/04/12                          03/05/12                 03/06/12  NO DRIVING for 1 week; you may begin driving on 07/07/90 WOUND CARE   Keep the wound area clean and dry.  Do not get this area wet for one week. No showers for one week; you may shower on 03/06/12   The tape/steri-strips on your wound will fall off; do not pull them off.  No bandage is needed on the site.  DO  NOT apply any creams, oils, or ointments to the wound area.   If you notice any drainage or discharge from the wound, any swelling or bruising at the site, or you develop a fever > 101? F after you are discharged home, call the office at once.  Special Instructions   You are still able to use cellular telephones; use the ear opposite the side where you have your pacemaker/defibrillator.  Avoid carrying your cellular phone near your device.   When traveling through airports, show security personnel your identification card to avoid being screened in the metal detectors.  Ask the security personnel to use the hand wand.   Avoid arc welding equipment, MRI testing (magnetic resonance imaging), TENS units (transcutaneous nerve stimulators).  Call the office for questions about other devices.   Avoid electrical appliances that are in poor condition or are not properly grounded.   Microwave ovens are safe to be near or to operate.  Additional information for defibrillator patients should your device go off:   If your device goes off ONCE and you feel fine afterward, notify the device clinic nurses.   If your device goes off ONCE and you do not feel well afterward, call 911.   If your device goes off TWICE,  call 911.   If your device goes off THREE times in one day, call 911.  DO NOT DRIVE YOURSELF OR A FAMILY MEMBER WITH A DEFIBRILLATOR TO THE HOSPITAL--CALL 911.

## 2012-03-01 NOTE — Discharge Summary (Signed)
ELECTROPHYSIOLOGY PROCEDURE DISCHARGE SUMMARY    Patient ID: Andres [name],  MRN: [id number], DOB/AGE: 12/24/1940 71 y.o.  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   Secondary Discharge Diagnosis:  1.  Atrial fibrillation- new diagnosis this admission 2.  Sleep apnea 3.  Diabetes 4.  CAD- status post CABG in 1994 following dissection during PCI.  Procedures This Admission:  1.  Cardiac catheterization on 4-3 by Dr Kirke Corin which demonstrated 1 vessel disease with patent SVG to OM.  Severely reduced LV function with EF of 25%. 2.  Electrophysiology study on 4-3 by Dr Graciela Husbands which demonstrated inducible ventricular tachycardia 3.  Implantation of a single chamber ICD on 4-3 by Dr Graciela Husbands.  The patient received a STJ model number 1257 ICD with model number 7122Q RV lead.  DFT's at time of implant were less than or equal to 16J.  There were no early apparent complications.  4.  Cardioversion on 4-5 of atrial fibrillation by Dr Graciela Husbands.   Brief HPI: Andres [name] is a 71 y.o. male with a history of coronary artery disease with bypass surgery undertaken urgently in 1994 following dissection during PCI. He has not had a catheterization since.  He has moderate left ventricular dysfunction and has had ejection fraction of 30-35% over the last year or so. He had a Myoview 2 years ago demonstrating a large scar but no ischemia.  He has class II symptoms. He is short of breath climbing a flight half of stairs; he does not have nocturnal dyspnea orthopnea or peripheral edema. He denies chest pain.  He is to get obstructive sleep apnea.  He has had problems with tachycardia palpitations associated with some shortness of breath but not lightheadedness. These are  abrupt in onset as well as offset. He has not had syncope. He does have orthostatic lightheadedness.  He has no history of atrial fibrillation. He denies prior stroke.  QRS duration is short.  Because of these things, he was referred to Dr Graciela Husbands in the outpatient setting for treatment options.  Repeat catheterization was recommended followed by EPS and ICD implantation of positive.  Risks, benefits, and alternatives were reviewed with the patient who wished to proceed.   Hospital Course:  The patient was admitted on 4-3 and underwent catheterization which demonstrated no new blockage disease.  This was followed by EPS which demonstrated inducible VT and therefore he underwent ICD implantation.  He was monitored on telemetry overnight and had one run of polymorphic VT that was self terminating at 6 beats.  Because of the concern for lead proarrythmia, he was kept until the afternoon.  In the afternoon of 4-4, he went into atrial fibrillation which is a new diagnosis for the patient.  He was kept overnight and underwent cardioversion on 4-5 by Dr Graciela Husbands.  After cardioversion, Dr Graciela Husbands considered the patient stable for discharge to home. At discharge, his Coreg dose was increased, and his Aspirin dose was decreased to 162mg  daily.  Plans for oral anti-coagulation will be discussed at office visit on 4-18.    Discharge Vitals: Blood pressure 103/62, pulse 73, temperature 97.9 F (36.6 C), temperature source Oral, resp. rate 20, height 5\' 10"  (1.778 m), weight 219 lb (99.338 kg), SpO2 93.00%.   Labs:   Lab Results  Component Value Date  WBC 9.3 02/28/2012   HGB 14.0 02/28/2012   HCT 42.1 02/28/2012   MCV 93.3 02/28/2012   PLT 240 02/28/2012    Lab 02/28/12 0728  NA 137  K 4.3  CL 103  CO2 25  BUN 19  CREATININE 1.16  CALCIUM 8.9  PROT --  BILITOT --  ALKPHOS --  ALT --  AST --  GLUCOSE 199*     Discharge Medications:  Medication List  As of 03/01/2012 11:21 AM   STOP taking these medications          ibuprofen 200 MG tablet         TAKE these medications         acetaminophen 325 MG tablet   Commonly known as: TYLENOL   Take 325 mg by mouth every 6 (six) hours as needed. For pain      aspirin 81 MG tablet   Take 2 tablets (162 mg total) by mouth daily.      carvedilol 3.125 MG tablet   Commonly known as: COREG   Take 2 tablets (6.25 mg total) by mouth 2 (two) times daily.      Cinnamon 500 MG Tabs   Take 1,000 mg by mouth 2 (two) times daily.      CO Q 10 PO   Take 300 mg by mouth daily.      ezetimibe 10 MG tablet   Commonly known as: ZETIA   Take 10 mg by mouth daily.      Fish Oil 1000 MG Caps   Take 1,000 mg by mouth 2 (two) times daily.      folic acid 1 MG tablet   Commonly known as: FOLVITE   Take 1 mg by mouth daily.      GARLIC PO   Take 1 capsule by mouth 2 (two) times daily.      GLUCOSAMINE CHONDR 1500 COMPLX PO   Take 1,500 mg by mouth daily.      insulin regular 100 units/mL injection   Commonly known as: NOVOLIN R,HUMULIN R   Inject 7-17 Units into the skin 2 (two) times daily before a meal. 17 units in am and 7 units in pm      loratadine 10 MG tablet   Commonly known as: CLARITIN   Take 10 mg by mouth daily.      metFORMIN 1000 MG tablet   Commonly known as: GLUCOPHAGE   Take 1 tablet (1,000 mg total) by mouth 2 (two) times daily with a meal.      multivitamin capsule   Take 1 capsule by mouth daily.      predniSONE 10 MG tablet   Commonly known as: STERAPRED UNI-PAK   Take 15 mg by mouth daily.      ramipril 10 MG capsule   Commonly known as: ALTACE   Take 10 mg by mouth daily.      rosuvastatin 5 MG tablet   Commonly known as: CRESTOR   Take 5 mg by mouth daily.            Disposition:  Discharge Orders    Future Appointments: Provider: Department: Dept Phone: Center:   03/14/2012 1:15 PM [facility] [phone] [facility]   03/18/2012 10:30 AM [name], MD  [facility] [phone] [facility]     Future Orders Please Complete By Expires   Diet - low sodium heart healthy      Comments:   Diabetic Diet   Increase activity slowly  Comments:   See attached sheet regarding activity and wound care. Keep cath site clean & dry. If you notice increased pain, swelling, bleeding or pus, call/return!  No soaking baths/hot tubs/pools for 1 week.       Follow-up Information    Follow up with [facility]. (03/14/12 at 1:15 pm for wound check)    Contact information:   [address] [place] [address] [phone]      Follow up with Lorine Bears, MD. (03/18/12 at 10:30am)    Contact information:   [place] [facility] [phone]          Duration of Discharge Encounter: Greater than 30 minutes including physician time.  Signed, Gypsy Balsam, RN, BSN 03/01/2012, 11:21 AM

## 2012-03-01 NOTE — Progress Notes (Signed)
Pt had successful DCCV at bedside. Pt  Now in SR at 80. Pt. Awake, follows commands, can swallow and cough. Denies pain. Discharge expected later today. Andres [name]

## 2012-03-04 ENCOUNTER — Encounter (HOSPITAL_COMMUNITY): Payer: Self-pay | Admitting: Internal Medicine

## 2012-03-14 ENCOUNTER — Ambulatory Visit (INDEPENDENT_AMBULATORY_CARE_PROVIDER_SITE_OTHER): Payer: Medicare Other | Admitting: *Deleted

## 2012-03-14 ENCOUNTER — Encounter: Payer: Self-pay | Admitting: Internal Medicine

## 2012-03-14 DIAGNOSIS — I5022 Chronic systolic (congestive) heart failure: Secondary | ICD-10-CM

## 2012-03-14 DIAGNOSIS — I509 Heart failure, unspecified: Secondary | ICD-10-CM

## 2012-03-14 DIAGNOSIS — I2589 Other forms of chronic ischemic heart disease: Secondary | ICD-10-CM

## 2012-03-14 DIAGNOSIS — I255 Ischemic cardiomyopathy: Secondary | ICD-10-CM

## 2012-03-14 LAB — ICD DEVICE OBSERVATION
DEVICE MODEL ICD: 7026078
FVT: 0
PACEART VT: 0
RV LEAD IMPEDENCE ICD: 487.5 Ohm
RV LEAD THRESHOLD: 0.5 V
TOT-0008: 0
TZAT-0001SLOWVT: 1
TZAT-0013SLOWVT: 2
TZAT-0019SLOWVT: 7.5 V
TZAT-0020SLOWVT: 1 ms
TZON-0003SLOWVT: 300 ms
TZON-0010SLOWVT: 40 ms
TZST-0001SLOWVT: 2
TZST-0001SLOWVT: 4
TZST-0003SLOWVT: 890 V
TZST-0003SLOWVT: 890 V
VENTRICULAR PACING ICD: 0.08 pct

## 2012-03-14 NOTE — Progress Notes (Signed)
Wound check-ICD 

## 2012-03-18 ENCOUNTER — Ambulatory Visit (INDEPENDENT_AMBULATORY_CARE_PROVIDER_SITE_OTHER): Payer: Medicare Other | Admitting: Cardiovascular Disease

## 2012-03-18 ENCOUNTER — Encounter: Payer: Self-pay | Admitting: Cardiovascular Disease

## 2012-03-18 VITALS — BP 106/68 | HR 68 | Ht 70.0 in | Wt 218.8 lb

## 2012-03-18 DIAGNOSIS — E785 Hyperlipidemia, unspecified: Secondary | ICD-10-CM | POA: Insufficient documentation

## 2012-03-18 DIAGNOSIS — I2510 Atherosclerotic heart disease of native coronary artery without angina pectoris: Secondary | ICD-10-CM | POA: Insufficient documentation

## 2012-03-18 DIAGNOSIS — I5022 Chronic systolic (congestive) heart failure: Secondary | ICD-10-CM

## 2012-03-18 DIAGNOSIS — I509 Heart failure, unspecified: Secondary | ICD-10-CM

## 2012-03-18 MED ORDER — CARVEDILOL 6.25 MG PO TABS: 6.2500 mg | ORAL_TABLET | Freq: Two times a day (BID) | ORAL | Status: DC

## 2012-03-18 NOTE — Progress Notes (Addendum)
HPI  This is a 71 year old male who is here today for a followup visit. He recently underwent evaluation for ICD placement. He had cardiac catheterization done which showed patent SVG to OM with severely reduced LV systolic function which was felt to be due to nonischemic cardiomyopathy. He had an EP study done by Dr. Graciela Husbands which showed induced ventricular tachycardia. He had an ICD placement done without complications. He is doing well. He denies any chest pain or dyspnea. He does have localized tingling sensation around his ICD site. He is able to do his activities of daily living without significant limitations. There is no orthopnea, PND or lower extremity edema. The patient requested transferring his cardiac care to our clinic.  No Known Allergies   Current Outpatient Prescriptions on File Prior to Visit  Medication Sig Dispense Refill  . aspirin 81 MG tablet Take 2 tablets (162 mg total) by mouth daily.      . Cinnamon 500 MG TABS Take 1,000 mg by mouth 2 (two) times daily.       . Coenzyme Q10 (CO Q 10 PO) Take 300 mg by mouth daily.      [name] ezetimibe (ZETIA) 10 MG tablet Take 10 mg by mouth daily.      . folic acid (FOLVITE) 1 MG tablet Take 1 mg by mouth daily.      [name] GARLIC PO Take 1 capsule by mouth 2 (two) times daily.       . Glucosamine-Chondroit-Vit C-Mn (GLUCOSAMINE CHONDR 1500 COMPLX PO) Take 1,500 mg by mouth daily.       . insulin regular (NOVOLIN R,HUMULIN R) 100 units/mL injection Inject 7-17 Units into the skin 2 (two) times daily before a meal. 17 units in am and 7 units in pm      . loratadine (CLARITIN) 10 MG tablet Take 10 mg by mouth daily.      . metFORMIN (GLUCOPHAGE) 1000 MG tablet Take 1 tablet (1,000 mg total) by mouth 2 (two) times daily with a meal.      . Multiple Vitamin (MULTIVITAMIN) capsule Take 1 capsule by mouth daily.      . Omega-3 Fatty Acids (FISH OIL) 1000 MG CAPS Take 1,000 mg by mouth 2 (two) times daily.      . predniSONE (STERAPRED UNI-PAK)  10 MG tablet Take 5 mg by mouth daily.       . ramipril (ALTACE) 10 MG capsule Take 10 mg by mouth daily.      . rosuvastatin (CRESTOR) 5 MG tablet Take 5 mg by mouth daily.         Past Medical History  Diagnosis Date  . Sleep apnea     On CPAP  . Ischemic cardiomyopathy     Status post CABG in 1994; echo/Myoview 2011-12 ejection fraction 30-35%  . Palpitations     inducible VT  . 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 Chronic systolic congestive heart failure   . ICD (implantable cardiac defibrillator) in place   . Pacemaker   . CHF (congestive heart failure)   . Type II diabetes mellitus   . Nephrolithiasis 1970's  . Ventricular tachycardia     inducible at EPS  . Coronary artery disease   . Hyperlipidemia      Past Surgical History  Procedure Date  . Coronary artery bypass graft 1994    [facility] ; CABG X1  . Cataract extraction w/ intraocular lens  implant, bilateral 2011  .  Total knee arthroplasty 2011    left  . Joint replacement   . Knee arthroscopy 2012    right  . Insert / replace / remove pacemaker 02/28/12    "w/defibrillator"  . Cardioversion 03/01/2012    Procedure: CARDIOVERSION;  Surgeon: [name], MD;  Location: [facility] OR;  Service: Cardiovascular;  Laterality: N/A;  . Cardiac catheterization 02/28/2012    Minor disease in LAD and RCA, LCX: 70% mid and occluded distally. Patent SVG to OM3     Family History  Problem Relation Age of Onset  . Heart attack Father      History   Social History  . Marital Status: Married    Spouse Name: N/A    Number of Children: 3  . Years of Education: N/A   Occupational History  . Not on file.   Social History Main Topics  . Smoking status: Former Smoker -- 1.0 packs/day for 25 years    Types: Cigarettes    Quit date: 01/09/1988  . Smokeless tobacco: Never Used  . Alcohol Use: No  . Drug Use: No  . Sexually Active: No   Other Topics Concern  . Not on file   Social History Narrative  . No narrative  on file     PHYSICAL EXAM   BP 106/68  Pulse 68  Ht 5\' 10"  (1.778 m)  Wt 218 lb 12 oz (99.224 kg)  BMI 31.39 kg/m2  Constitutional: He is oriented to person, place, and time. He appears well-developed and well-nourished. No distress.  HENT: No nasal discharge.  Head: Normocephalic and atraumatic.  Eyes: Pupils are equal and round. Right eye exhibits no discharge. Left eye exhibits no discharge.  Neck: Normal range of motion. Neck supple. No JVD present. No thyromegaly present.  Cardiovascular: Normal rate, regular rhythm, normal heart sounds and. Exam reveals no gallop and no friction rub. No murmur heard.  Pulmonary/Chest: Effort normal and breath sounds normal. No stridor. No respiratory distress. He has no wheezes. He has no rales. He exhibits no tenderness.  Abdominal: Soft. Bowel sounds are normal. He exhibits no distension. There is no tenderness. There is no rebound and no guarding.  Musculoskeletal: Normal range of motion. He exhibits no edema and no tenderness.  Neurological: He is alert and oriented to person, place, and time. Coordination normal.  Skin: Skin is warm and dry. No rash noted. He is not diaphoretic. No erythema. No pallor.  Psychiatric: He has a normal mood and affect. His behavior is normal. Judgment and thought content normal.      ASSESSMENT AND PLAN

## 2012-03-18 NOTE — Assessment & Plan Note (Signed)
Patient is not having any symptoms suggestive of angina. His recent cardiac catheterization showed patent SVG graft to OM.

## 2012-03-18 NOTE — Assessment & Plan Note (Signed)
I recommend a target LDL of less than 70. 

## 2012-03-18 NOTE — Patient Instructions (Signed)
-  follow up in 6 months

## 2012-03-18 NOTE — Assessment & Plan Note (Signed)
The patient seems to be doing well at this time. He is in [place] Heart Association class II. There is no evidence of fluid overload. Continue treatment with carvedilol and ramipril. The patient might benefit from treatment with an Aldosterone blocker. This can be considered during subsequent visits.

## 2012-03-28 ENCOUNTER — Encounter: Payer: Self-pay | Admitting: Internal Medicine

## 2012-04-09 ENCOUNTER — Encounter: Payer: Self-pay | Admitting: Internal Medicine

## 2012-06-18 ENCOUNTER — Ambulatory Visit (INDEPENDENT_AMBULATORY_CARE_PROVIDER_SITE_OTHER): Payer: Medicare Other | Admitting: Internal Medicine

## 2012-06-18 ENCOUNTER — Encounter: Payer: Self-pay | Admitting: Internal Medicine

## 2012-06-18 VITALS — BP 106/70 | HR 80 | Ht 70.0 in | Wt 224.2 lb

## 2012-06-18 DIAGNOSIS — I509 Heart failure, unspecified: Secondary | ICD-10-CM

## 2012-06-18 DIAGNOSIS — I4729 Other ventricular tachycardia: Secondary | ICD-10-CM

## 2012-06-18 DIAGNOSIS — I2589 Other forms of chronic ischemic heart disease: Secondary | ICD-10-CM

## 2012-06-18 DIAGNOSIS — I472 Ventricular tachycardia, unspecified: Secondary | ICD-10-CM

## 2012-06-18 DIAGNOSIS — I255 Ischemic cardiomyopathy: Secondary | ICD-10-CM

## 2012-06-18 DIAGNOSIS — Z95810 Presence of automatic (implantable) cardiac defibrillator: Secondary | ICD-10-CM

## 2012-06-18 DIAGNOSIS — I5022 Chronic systolic (congestive) heart failure: Secondary | ICD-10-CM

## 2012-06-18 LAB — ICD DEVICE OBSERVATION
BRDY-0002RV: 40 {beats}/min
DEVICE MODEL ICD: 7026078
RV LEAD AMPLITUDE: 11.7 mv
RV LEAD THRESHOLD: 0.75 V
TZAT-0013SLOWVT: 2
TZAT-0020SLOWVT: 1 ms
TZON-0005SLOWVT: 6
TZON-0010SLOWVT: 40 ms
TZST-0001SLOWVT: 2
TZST-0001SLOWVT: 4
TZST-0001SLOWVT: 5
TZST-0003SLOWVT: 890 V
TZST-0003SLOWVT: 890 V
VENTRICULAR PACING ICD: 1 pct

## 2012-06-18 NOTE — Patient Instructions (Addendum)
Your physician wants you to follow-up in: April with Dr Klein You will receive a reminder letter in the mail two months in advance. If you don't receive a letter, please call our office to schedule the follow-up appointment.  

## 2012-06-18 NOTE — Progress Notes (Signed)
HPI  Andres [name] is a 71 y.o. male Seen in followup for ICD implanted for ischemic cardiomyopathy palpitations and inducible ventricular tachycardia -April 2013  The patient denies chest pain, shortness of breath, nocturnal dyspnea, orthopnea or peripheral edema.  There have been no palpitations, lightheadedness or syncope.    Past Medical History  Diagnosis Date  . Sleep apnea     On CPAP  . Ischemic cardiomyopathy     Status post CABG in 1994; echo/Myoview 2011-12 ejection fraction 30-35%  . Palpitations     inducible VT  . 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 Chronic systolic congestive heart failure   . ICD (implantable cardiac defibrillator) in place   . Pacemaker   . CHF (congestive heart failure)   . Type II diabetes mellitus   . Nephrolithiasis 1970's  . Ventricular tachycardia     inducible at EPS  . Coronary artery disease   . Hyperlipidemia     Past Surgical History  Procedure Date  . Coronary artery bypass graft 1994    [facility] ; CABG X1  . Cataract extraction w/ intraocular lens  implant, bilateral 2011  . Total knee arthroplasty 2011    left  . Joint replacement   . Knee arthroscopy 2012    right  . Insert / replace / remove pacemaker 02/28/12    "w/defibrillator"  . Cardioversion 03/01/2012    Procedure: CARDIOVERSION;  Surgeon: [name], MD;  Location: [facility] OR;  Service: Cardiovascular;  Laterality: N/A;  . Cardiac catheterization 02/28/2012    Minor disease in LAD and RCA, LCX: 70% mid and occluded distally. Patent SVG to OM3    Current Outpatient Prescriptions  Medication Sig Dispense Refill  . aspirin 81 MG tablet Take 2 tablets (162 mg total) by mouth daily.      . carvedilol (COREG) 6.25 MG tablet Take 1 tablet (6.25 mg total) by mouth 2 (two) times daily.  60 tablet  11  . Cinnamon 500 MG TABS Take 1,000 mg by mouth 2 (two) times daily.       . Coenzyme Q10 (CO Q 10 PO) Take 300 mg by mouth daily.      [name] ezetimibe (ZETIA) 10 MG tablet  Take 10 mg by mouth daily.      . folic acid (FOLVITE) 1 MG tablet Take 1 mg by mouth daily.      [name] GARLIC PO Take 1 capsule by mouth 2 (two) times daily.       . Glucosamine-Chondroit-Vit C-Mn (GLUCOSAMINE CHONDR 1500 COMPLX PO) Take 1,500 mg by mouth daily.       . insulin regular (NOVOLIN R,HUMULIN R) 100 units/mL injection Inject 7-17 Units into the skin 2 (two) times daily before a meal. 17 units in am and 7 units in pm      . insulin regular human CONCENTRATED (HUMULIN R) 500 UNIT/ML SOLN injection 8 units in the am & 18 units in the pm      . loratadine (CLARITIN) 10 MG tablet Take 10 mg by mouth daily.      . Metformin HCl 500 MG/5ML SOLN Take 1,000 mg by mouth daily.      . methotrexate (RHEUMATREX) 2.5 MG tablet Take four tablets weekly.      . Multiple Vitamin (MULTIVITAMIN) capsule Take 1 capsule by mouth daily.      . Omega-3 Fatty Acids (FISH OIL) 1000 MG CAPS Take 1,000 mg by mouth 2 (two) times daily.      [name]  predniSONE (STERAPRED UNI-PAK) 10 MG tablet Take 2.5 mg by mouth daily.       . ramipril (ALTACE) 10 MG capsule Take 10 mg by mouth daily.      . rosuvastatin (CRESTOR) 5 MG tablet Take 5 mg by mouth daily.        No Known Allergies  Review of Systems negative except from HPI and PMH  Physical Exam BP 106/70  Pulse 80  Ht 5\' 10"  (1.778 m)  Wt 224 lb 4 oz (101.719 kg)  BMI 32.18 kg/m2 Well developed and well nourished in no acute distress HENT normal E scleral and icterus clear Neck Supple JVP flat; Device pocket well healed; without hematoma or erythema Clear to ausculation Regular rate and rhythm, no murmurs gallops or rub Soft with active bowel sounds No clubbing cyanosis none Edema Alert and oriented, grossly normal motor and sensory function Skin Warm and Dry    Assessment and  Plan

## 2012-06-18 NOTE — Assessment & Plan Note (Signed)
The patient's device was interrogated.  The information was reviewed. No changes were made in the programming.    

## 2012-06-18 NOTE — Assessment & Plan Note (Signed)
stable °

## 2012-06-18 NOTE — Assessment & Plan Note (Signed)
No intercurrent Ventricular tachycardia  

## 2012-09-17 ENCOUNTER — Encounter: Payer: Self-pay | Admitting: Cardiovascular Disease

## 2012-09-17 ENCOUNTER — Ambulatory Visit (INDEPENDENT_AMBULATORY_CARE_PROVIDER_SITE_OTHER): Payer: Medicare Other | Admitting: Cardiovascular Disease

## 2012-09-17 VITALS — BP 112/62 | HR 76 | Ht 70.0 in | Wt 225.0 lb

## 2012-09-17 DIAGNOSIS — I2510 Atherosclerotic heart disease of native coronary artery without angina pectoris: Secondary | ICD-10-CM

## 2012-09-17 DIAGNOSIS — E785 Hyperlipidemia, unspecified: Secondary | ICD-10-CM

## 2012-09-17 DIAGNOSIS — I4821 Permanent atrial fibrillation: Secondary | ICD-10-CM | POA: Insufficient documentation

## 2012-09-17 DIAGNOSIS — I509 Heart failure, unspecified: Secondary | ICD-10-CM

## 2012-09-17 DIAGNOSIS — I5022 Chronic systolic (congestive) heart failure: Secondary | ICD-10-CM

## 2012-09-17 DIAGNOSIS — I4891 Unspecified atrial fibrillation: Secondary | ICD-10-CM | POA: Insufficient documentation

## 2012-09-17 MED ORDER — ROSUVASTATIN CALCIUM 5 MG PO TABS: 10.0000 mg | ORAL_TABLET | Freq: Every day | ORAL | Status: DC

## 2012-09-17 MED ORDER — RIVAROXABAN 20 MG PO TABS: 20.0000 mg | ORAL_TABLET | Freq: Every day | ORAL | Status: DC

## 2012-09-17 NOTE — Assessment & Plan Note (Signed)
In order to simplify his medications, I will stop Zetia and increase Crestor 10 mg daily.

## 2012-09-17 NOTE — Assessment & Plan Note (Signed)
He is not having symptoms suggestive of angina. Given that he had no recent revascularization, I will stop aspirin to decrease bleeding risk given that he would be on Xarelto.

## 2012-09-17 NOTE — Patient Instructions (Addendum)
Labs today.  Stop Aspirin.  Start Zetia.  Start Xarelto 20 mg once daily for A-fib.  Increase Crestor to 10 mg once daily.   Follow up in 2 months.

## 2012-09-17 NOTE — Assessment & Plan Note (Signed)
He seems to be euvolemic. He is currently New [place] Heart Association class II. Continue current treatment.

## 2012-09-17 NOTE — Progress Notes (Signed)
HPI  This is a 71 year old male who is here today for a followup visit. He has known history of coronary artery disease with nonischemic cardiomyopathy.  He had cardiac catheterization done in April of this year which showed patent SVG to OM with severely reduced LV systolic function(EF of 25-30%).  He had an EP study done by Dr. Graciela Husbands which showed induced ventricular tachycardia. He had an ICD placement done. One day after placement, he had atrial fibrillation which was a new diagnosis. He underwent cardioversion.  He is overall doing reasonably well. He denies chest pain, dyspnea, palpitations or dizziness.  No Known Allergies   Current Outpatient Prescriptions on File Prior to Visit  Medication Sig Dispense Refill  . carvedilol (COREG) 6.25 MG tablet Take 1 tablet (6.25 mg total) by mouth 2 (two) times daily.  60 tablet  11  . Cinnamon 500 MG TABS Take 1,000 mg by mouth 2 (two) times daily.       . Coenzyme Q10 (CO Q 10 PO) Take 300 mg by mouth daily.      . folic acid (FOLVITE) 1 MG tablet Take 1 mg by mouth daily.      [name] GARLIC PO Take 1 capsule by mouth 2 (two) times daily.       . Glucosamine-Chondroit-Vit C-Mn (GLUCOSAMINE CHONDR 1500 COMPLX PO) Take 1,500 mg by mouth daily.       . insulin regular (NOVOLIN R,HUMULIN R) 100 units/mL injection Inject 7-17 Units into the skin 2 (two) times daily before a meal. 17 units in am and 7 units in pm      . insulin regular human CONCENTRATED (HUMULIN R) 500 UNIT/ML SOLN injection 8 units in the am & 18 units in the pm      . loratadine (CLARITIN) 10 MG tablet Take 10 mg by mouth daily.      . Metformin HCl 500 MG/5ML SOLN Take 1,000 mg by mouth daily.      . methotrexate (RHEUMATREX) 2.5 MG tablet Take four tablets weekly.      . Multiple Vitamin (MULTIVITAMIN) capsule Take 1 capsule by mouth daily.      . Omega-3 Fatty Acids (FISH OIL) 1000 MG CAPS Take 1,000 mg by mouth 2 (two) times daily.      . predniSONE (STERAPRED UNI-PAK) 10 MG  tablet Take 2.5 mg by mouth daily.       . ramipril (ALTACE) 10 MG capsule Take 10 mg by mouth daily.      [name] DISCONTD: rosuvastatin (CRESTOR) 5 MG tablet Take 5 mg by mouth daily.      . Rivaroxaban (XARELTO) 20 MG TABS Take 1 tablet (20 mg total) by mouth daily.  30 tablet  6     Past Medical History  Diagnosis Date  . Sleep apnea     On CPAP  . Ischemic cardiomyopathy     Status post CABG in 1994; echo/Myoview 2011-12 ejection fraction 30-35%  . Ventricular tachycardia -inducible at EP testing     ppalpitations  . 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 Chronic systolic congestive heart failure   . ICD (implantable cardiac defibrillator) in place   . Type II diabetes mellitus   . Nephrolithiasis 1970's  . Coronary artery disease   . Hyperlipidemia      Past Surgical History  Procedure Date  . Coronary artery bypass graft 1994    [facility] ; CABG X1  . Cataract extraction w/ intraocular lens  implant, bilateral 2011  . Total knee arthroplasty 2011    left  . Joint replacement   . Knee arthroscopy 2012    right  . Insert / replace / remove pacemaker 02/28/12    "w/defibrillator"  . Cardioversion 03/01/2012    Procedure: CARDIOVERSION;  Surgeon: [name], MD;  Location: [facility] OR;  Service: Cardiovascular;  Laterality: N/A;  . Cardiac catheterization 02/28/2012    Minor disease in LAD and RCA, LCX: 70% mid and occluded distally. Patent SVG to OM3  . Basel cell removal     left arm     Family History  Problem Relation Age of Onset  . Heart attack Father      History   Social History  . Marital Status: Married    Spouse Name: N/A    Number of Children: 3  . Years of Education: N/A   Occupational History  . Not on file.   Social History Main Topics  . Smoking status: Former Smoker -- 1.0 packs/day for 25 years    Types: Cigarettes    Quit date: 01/09/1988  . Smokeless tobacco: Never Used  . Alcohol Use: No  . Drug Use: No  . Sexually Active: No   Other  Topics Concern  . Not on file   Social History Narrative  . No narrative on file     PHYSICAL EXAM   BP 112/62  Pulse 76  Ht 5\' 10"  (1.778 m)  Wt 225 lb (102.059 kg)  BMI 32.28 kg/m2  Constitutional: He is oriented to person, place, and time. He appears well-developed and well-nourished. No distress.  HENT: No nasal discharge.  Head: Normocephalic and atraumatic.  Eyes: Pupils are equal and round. Right eye exhibits no discharge. Left eye exhibits no discharge.  Neck: Normal range of motion. Neck supple. No JVD present. No thyromegaly present.  Cardiovascular: Normal rate, irregular rhythm, normal heart sounds and. Exam reveals no gallop and no friction rub. No murmur heard.  Pulmonary/Chest: Effort normal and breath sounds normal. No stridor. No respiratory distress. He has no wheezes. He has no rales. He exhibits no tenderness.  Abdominal: Soft. Bowel sounds are normal. He exhibits no distension. There is no tenderness. There is no rebound and no guarding.  Musculoskeletal: Normal range of motion. He exhibits no edema and no tenderness.  Neurological: He is alert and oriented to person, place, and time. Coordination normal.  Skin: Skin is warm and dry. No rash noted. He is not diaphoretic. No erythema. No pallor.  Psychiatric: He has a normal mood and affect. His behavior is normal. Judgment and thought content normal.    AOZ:HYQMVH fibrillation  Low voltage -possible pulmonary disease.   ABNORMAL   ASSESSMENT AND PLAN

## 2012-09-17 NOTE — Assessment & Plan Note (Signed)
The patient is noted today to be in atrial fibrillation. This is his second documented episode of atrial fibrillation. His [place] VASc score is 3 with an overall low bleeding risk. Thus, he should benefit from long-term oral anticoagulation. I discussed different options with and we decided to start Xarelto 20 mg once daily. Side effects were explained to him. I was check routine labs today. His ventricular rate is controlled and he does not seem to be symptomatic from this.

## 2012-09-18 ENCOUNTER — Telehealth: Payer: Self-pay | Admitting: Cardiovascular Disease

## 2012-09-18 ENCOUNTER — Other Ambulatory Visit: Payer: Self-pay

## 2012-09-18 LAB — HEPATIC FUNCTION PANEL
ALT: 17 IU/L (ref 0–44)
Albumin: 4 g/dL (ref 3.5–4.8)
Bilirubin, Direct: 0.17 mg/dL (ref 0.00–0.40)
Total Bilirubin: 0.5 mg/dL (ref 0.0–1.2)
Total Protein: 6.3 g/dL (ref 6.0–8.5)

## 2012-09-18 LAB — PROTIME-INR
INR: 1.1 (ref 0.8–1.2)
Prothrombin Time: 11.2 s (ref 9.1–12.0)

## 2012-09-18 LAB — BASIC METABOLIC PANEL
BUN/Creatinine Ratio: 16 (ref 10–22)
Calcium: 9.3 mg/dL (ref 8.6–10.2)
Creatinine, Ser: 1.24 mg/dL (ref 0.76–1.27)
GFR calc Af Amer: 67 mL/min/{1.73_m2} (ref >59)
GFR calc non Af Amer: 58 mL/min/{1.73_m2} — ABNORMAL LOW (ref >59)
Sodium: 137 mmol/L (ref 134–144)

## 2012-09-18 LAB — CBC WITH DIFFERENTIAL/PLATELET
Basos: 0 % (ref 0–3)
Eos: 2 % (ref 0–5)
HCT: 42.4 % (ref 37.5–51.0)
Hemoglobin: 13.7 g/dL (ref 12.6–17.7)
Lymphocytes Absolute: 1.6 10*3/uL (ref 0.7–3.1)
Lymphs: 20 % (ref 14–46)
Monocytes: 8 % (ref 4–12)
Neutrophils Absolute: 5.5 10*3/uL (ref 1.4–7.0)
RBC: 4.2 x10E6/uL (ref 4.14–5.80)

## 2012-09-18 MED ORDER — METHOTREXATE (ANTI-RHEUMATIC) 2.5 MG PO TABS: ORAL_TABLET | ORAL | Status: DC

## 2012-09-18 MED ORDER — METFORMIN HCL 500 MG/5ML PO SOLN: 1000.0000 mg | Freq: Two times a day (BID) | ORAL | Status: DC

## 2012-09-18 NOTE — Telephone Encounter (Signed)
Pt wife called stating that after they got home and reviewed his med list there are things on there that shouldn't be from other mds

## 2012-09-18 NOTE — Telephone Encounter (Signed)
updated

## 2012-09-19 ENCOUNTER — Telehealth: Payer: Self-pay

## 2012-09-19 NOTE — Telephone Encounter (Signed)
Notified [name] with [facility] the xarelto has been approved for one year start 09/19/2012 to 09/19/2013 per [place] with [employer].

## 2012-09-23 ENCOUNTER — Encounter: Payer: Medicare Other | Admitting: *Deleted

## 2012-09-24 ENCOUNTER — Encounter: Payer: Self-pay | Admitting: *Deleted

## 2012-10-01 ENCOUNTER — Ambulatory Visit (INDEPENDENT_AMBULATORY_CARE_PROVIDER_SITE_OTHER): Payer: Medicare Other | Admitting: *Deleted

## 2012-10-01 ENCOUNTER — Encounter: Payer: Self-pay | Admitting: Internal Medicine

## 2012-10-01 ENCOUNTER — Telehealth: Payer: Self-pay | Admitting: Internal Medicine

## 2012-10-01 DIAGNOSIS — Z95810 Presence of automatic (implantable) cardiac defibrillator: Secondary | ICD-10-CM

## 2012-10-01 DIAGNOSIS — I255 Ischemic cardiomyopathy: Secondary | ICD-10-CM

## 2012-10-01 DIAGNOSIS — I2589 Other forms of chronic ischemic heart disease: Secondary | ICD-10-CM

## 2012-10-01 NOTE — Telephone Encounter (Signed)
plz return call to pt hm# needs help with remote transmission.

## 2012-10-01 NOTE — Telephone Encounter (Signed)
Pt was out of town when scheduled for remote check. Pt instructed to press white button x 2 and transmission should be sent to [place]. Pt understands and will do this afternoon.

## 2012-10-02 ENCOUNTER — Encounter: Payer: Self-pay | Admitting: *Deleted

## 2012-10-02 LAB — REMOTE ICD DEVICE
DEV-0020ICD: NEGATIVE
RV LEAD IMPEDENCE ICD: 450 Ohm
TZAT-0001SLOWVT: 1
TZAT-0004SLOWVT: 8
TZAT-0012SLOWVT: 200 ms
TZAT-0019SLOWVT: 7.5 V
TZON-0004SLOWVT: 45
TZST-0001SLOWVT: 3
TZST-0001SLOWVT: 5
TZST-0003SLOWVT: 800 V
TZST-0003SLOWVT: 890 V

## 2012-10-29 ENCOUNTER — Encounter: Payer: Self-pay | Admitting: Cardiovascular Disease

## 2012-10-29 ENCOUNTER — Ambulatory Visit (INDEPENDENT_AMBULATORY_CARE_PROVIDER_SITE_OTHER): Payer: Medicare Other | Admitting: Cardiovascular Disease

## 2012-10-29 ENCOUNTER — Telehealth: Payer: Self-pay | Admitting: Cardiovascular Disease

## 2012-10-29 VITALS — BP 128/60 | HR 78 | Ht 70.0 in | Wt 231.0 lb

## 2012-10-29 DIAGNOSIS — R0989 Other specified symptoms and signs involving the circulatory and respiratory systems: Secondary | ICD-10-CM

## 2012-10-29 DIAGNOSIS — I2510 Atherosclerotic heart disease of native coronary artery without angina pectoris: Secondary | ICD-10-CM

## 2012-10-29 DIAGNOSIS — R0609 Other forms of dyspnea: Secondary | ICD-10-CM

## 2012-10-29 DIAGNOSIS — I509 Heart failure, unspecified: Secondary | ICD-10-CM

## 2012-10-29 DIAGNOSIS — I4891 Unspecified atrial fibrillation: Secondary | ICD-10-CM

## 2012-10-29 DIAGNOSIS — I5022 Chronic systolic (congestive) heart failure: Secondary | ICD-10-CM

## 2012-10-29 MED ORDER — FUROSEMIDE 40 MG PO TABS: 40.0000 mg | ORAL_TABLET | Freq: Every day | ORAL | Status: DC

## 2012-10-29 NOTE — Patient Instructions (Addendum)
Labs today.  Start Furosemide (Lasix) 40 mg once daily for 1 week then decrease to 20 mg (1/2 tablet) once daily after that.  Follow low sodium (salt) diet.  Schedule a consult with Dr. Graciela Husbands for A-fib.  Follow up in 3 months.

## 2012-10-29 NOTE — Assessment & Plan Note (Addendum)
The patient is in atrial fibrillation with controlled ventricular rate. He did have cardioversion done once before by Dr. Graciela Husbands after his ICD placement. I will have him followup with Dr. Graciela Husbands for a second opinion regarding the utility of converting him again to sinus rhythm. If we decide to go that route, he will likely require an antiarrhythmic medication such as amiodarone or dofetilide. Continue anticoagulation with Xarelto.

## 2012-10-29 NOTE — Assessment & Plan Note (Signed)
The patient appears to be mildly decompensated with evidence of fluid overload. He has not required diuretics in the past. I suspect that the current presentation is likely triggered by excessive salt intake. It is possible that the atrial fibrillation might be contributing. However, there is less likely given that he was in A. fib in October and was asymptomatic at that time. Also his ventricular rate has been well-controlled. I will request routine labs today including CBC, basic metabolic profile and BNP. I asked him to follow a low-sodium diet. I will start him on furosemide 40 mg once daily for one week and then decrease the dose to 20 mg once daily. I will consider starting him on spironolactone once the acute episode is treated.

## 2012-10-29 NOTE — Telephone Encounter (Signed)
Pt c/o new onset DOE Says this has been going on x 2 days and he states, "I cannot catch my breath" Says he wears a CPAP at hs and sob is "waking him up from sleep" He thought this was associated with CPAP but no adjustments he makes helps He denies any other associated symptoms such as chest discomfort, edema, weight gain or dizziness Sleeps on 1 pillow at hs I had him hold while I discussed with Dr. Kirke Corin  Per Dr. Kirke Corin, pt should come in to be seen appt made for today at 1030 Pt verb. understanding

## 2012-10-29 NOTE — Assessment & Plan Note (Signed)
He has no symptoms suggestive of angina. 

## 2012-10-29 NOTE — Telephone Encounter (Signed)
Pt calling c/o SOB wants to know what he should do

## 2012-10-29 NOTE — Progress Notes (Signed)
Primary care physician: Dr. Randa Lynn Electrophysiologist: Dr. Graciela Husbands.  HPI  This is a 71 year old male who is here today for a followup visit. He has known history of coronary artery disease with nonischemic cardiomyopathy.  He had cardiac catheterization done in April of this year which showed patent SVG to OM with severely reduced LV systolic function(EF of 25-30%).  He had an EP study done by Dr. Graciela Husbands which showed induced ventricular tachycardia. He had an ICD placement done. One day after placement, he had atrial fibrillation which was a new diagnosis at that time. He underwent successful cardioversion. He maintained in sinus rhythm but was noted to be in atrial fibrillation during his most recent visit in October. His ventricular rate was controlled and he was asymptomatic. I started him on anticoagulation with Xarelto. He reports no bleeding side effects with it.  However, he called our office yesterday due to worsening dyspnea over the last few days. The patient had excessive salt intake around Thanksgiving with [place] and ham. He noticed increased dyspnea, dry cough and orthopnea. He denies any recent sick contacts. He also noticed increased lower extremity edema. His weight is up 6 pounds from his baseline. He denies chest pain or palpitations.   No Known Allergies   Current Outpatient Prescriptions on File Prior to Visit  Medication Sig Dispense Refill  . carvedilol (COREG) 6.25 MG tablet Take 1 tablet (6.25 mg total) by mouth 2 (two) times daily.  60 tablet  11  . Cinnamon 500 MG TABS Take 1,000 mg by mouth 2 (two) times daily.       . Coenzyme Q10 (CO Q 10 PO) Take 300 mg by mouth daily.      . folic acid (FOLVITE) 1 MG tablet Take 1 mg by mouth daily.      [name] GARLIC PO Take 1 capsule by mouth 2 (two) times daily.       . Glucosamine-Chondroit-Vit C-Mn (GLUCOSAMINE CHONDR 1500 COMPLX PO) Take 1,500 mg by mouth daily.       . insulin regular human CONCENTRATED (HUMULIN R) 500 UNIT/ML  SOLN injection 8 units in the am & 18 units in the pm      . Metformin HCl 500 MG/5ML SOLN Take 10 mLs (1,000 mg total) by mouth 2 (two) times daily.  120 mL  3  . methotrexate (RHEUMATREX) 2.5 MG tablet Takesixtablets weekly.  12 tablet  0  . Multiple Vitamin (MULTIVITAMIN) capsule Take 1 capsule by mouth daily.      . Omega-3 Fatty Acids (FISH OIL) 1000 MG CAPS Take 1,000 mg by mouth 2 (two) times daily.      . ramipril (ALTACE) 10 MG capsule Take 10 mg by mouth daily.      . Rivaroxaban (XARELTO) 20 MG TABS Take 1 tablet (20 mg total) by mouth daily.  30 tablet  6  . rosuvastatin (CRESTOR) 5 MG tablet Take 2 tablets (10 mg total) by mouth daily.  30 tablet  6  . furosemide (LASIX) 40 MG tablet Take 1 tablet (40 mg total) by mouth daily.  30 tablet  6     Past Medical History  Diagnosis Date  . Sleep apnea     On CPAP  . Ischemic cardiomyopathy     Status post CABG in 1994; echo/Myoview 2011-12 ejection fraction 30-35%  . Ventricular tachycardia -inducible at EP testing     ppalpitations  . 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 Chronic systolic congestive heart  failure   . ICD (implantable cardiac defibrillator) in place   . Type II diabetes mellitus   . Nephrolithiasis 1970's  . Coronary artery disease   . Hyperlipidemia      Past Surgical History  Procedure Date  . Coronary artery bypass graft 1994    [facility] ; CABG X1  . Cataract extraction w/ intraocular lens  implant, bilateral 2011  . Total knee arthroplasty 2011    left  . Joint replacement   . Knee arthroscopy 2012    right  . Insert / replace / remove pacemaker 02/28/12    "w/defibrillator"  . Cardioversion 03/01/2012    Procedure: CARDIOVERSION;  Surgeon: [name], MD;  Location: [facility] OR;  Service: Cardiovascular;  Laterality: N/A;  . Cardiac catheterization 02/28/2012    Minor disease in LAD and RCA, LCX: 70% mid and occluded distally. Patent SVG to OM3  . Basel cell removal     left arm     Family  History  Problem Relation Age of Onset  . Heart attack Father      History   Social History  . Marital Status: Married    Spouse Name: N/A    Number of Children: 3  . Years of Education: N/A   Occupational History  . Not on file.   Social History Main Topics  . Smoking status: Former Smoker -- 1.0 packs/day for 25 years    Types: Cigarettes    Quit date: 01/09/1988  . Smokeless tobacco: Never Used  . Alcohol Use: No  . Drug Use: No  . Sexually Active: No   Other Topics Concern  . Not on file   Social History Narrative  . No narrative on file     PHYSICAL EXAM   BP 128/60  Pulse 78  Ht 5\' 10"  (1.778 m)  Wt 231 lb (104.781 kg)  BMI 33.15 kg/m2  SpO2 90%  Constitutional: He is oriented to person, place, and time. He appears well-developed and well-nourished. No distress.  HENT: No nasal discharge.  Head: Normocephalic and atraumatic.  Eyes: Pupils are equal and round. Right eye exhibits no discharge. Left eye exhibits no discharge.  Neck: Normal range of motion. Neck supple. No JVD present. No thyromegaly present.  Cardiovascular: Normal rate, irregular rhythm, normal heart sounds and. Exam reveals no gallop and no friction rub. No murmur heard.  Pulmonary/Chest: Effort normal. No stridor. No respiratory distress. He has no wheezes. He has bibasilar crackles more at the right base.  Abdominal: Soft. Bowel sounds are normal. He exhibits no distension. There is no tenderness. There is no rebound and no guarding.  Musculoskeletal: Normal range of motion. He exhibits +1 edema and no tenderness.  Neurological: He is alert and oriented to person, place, and time. Coordination normal.  Skin: Skin is warm and dry. No rash noted. He is not diaphoretic. No erythema. No pallor.  Psychiatric: He has a normal mood and affect. His behavior is normal. Judgment and thought content normal.    ZOX:WRUEAV fibrillation with controlled ventricular rate and heart rate of 78 beats per  minute.   ASSESSMENT AND PLAN

## 2012-10-30 LAB — CBC WITH DIFFERENTIAL/PLATELET
Basos: 0 % (ref 0–3)
Eosinophils Absolute: 0.1 10*3/uL (ref 0.0–0.4)
Immature Grans (Abs): 0 10*3/uL (ref 0.0–0.1)
Immature Granulocytes: 0 % (ref 0–2)
MCH: 32.9 pg (ref 26.6–33.0)
MCV: 100 fL — ABNORMAL HIGH (ref 79–97)
Monocytes Absolute: 0.6 10*3/uL (ref 0.1–0.9)
Neutrophils Relative %: 70 % (ref 40–74)
RDW: 16.8 % — ABNORMAL HIGH (ref 12.3–15.4)

## 2012-10-30 LAB — BASIC METABOLIC PANEL
Calcium: 8.9 mg/dL (ref 8.6–10.2)
GFR calc non Af Amer: 52 mL/min/{1.73_m2} — ABNORMAL LOW (ref >59)
Potassium: 5.1 mmol/L (ref 3.5–5.2)
Sodium: 142 mmol/L (ref 134–144)

## 2012-10-31 ENCOUNTER — Other Ambulatory Visit: Payer: Self-pay

## 2012-10-31 DIAGNOSIS — R609 Edema, unspecified: Secondary | ICD-10-CM

## 2012-11-04 ENCOUNTER — Encounter: Payer: Self-pay | Admitting: *Deleted

## 2012-11-05 ENCOUNTER — Ambulatory Visit (INDEPENDENT_AMBULATORY_CARE_PROVIDER_SITE_OTHER): Payer: Medicare Other

## 2012-11-05 DIAGNOSIS — R609 Edema, unspecified: Secondary | ICD-10-CM

## 2012-11-06 LAB — BASIC METABOLIC PANEL
BUN/Creatinine Ratio: 14 (ref 10–22)
CO2: 24 mmol/L (ref 19–28)
Creatinine, Ser: 1.43 mg/dL — ABNORMAL HIGH (ref 0.76–1.27)
Sodium: 140 mmol/L (ref 134–144)

## 2012-11-07 ENCOUNTER — Other Ambulatory Visit: Payer: Self-pay

## 2012-11-07 DIAGNOSIS — I4891 Unspecified atrial fibrillation: Secondary | ICD-10-CM

## 2012-12-03 ENCOUNTER — Encounter: Payer: Medicare Other | Admitting: Internal Medicine

## 2012-12-05 ENCOUNTER — Ambulatory Visit (INDEPENDENT_AMBULATORY_CARE_PROVIDER_SITE_OTHER): Payer: Medicare Other

## 2012-12-05 ENCOUNTER — Ambulatory Visit: Payer: Medicare Other | Admitting: Cardiovascular Disease

## 2012-12-05 DIAGNOSIS — I4891 Unspecified atrial fibrillation: Secondary | ICD-10-CM

## 2012-12-06 LAB — BASIC METABOLIC PANEL
BUN/Creatinine Ratio: 18 (ref 10–22)
CO2: 23 mmol/L (ref 19–28)
Creatinine, Ser: 1.36 mg/dL — ABNORMAL HIGH (ref 0.76–1.27)
GFR calc Af Amer: 60 mL/min/{1.73_m2} (ref >59)
GFR calc non Af Amer: 52 mL/min/{1.73_m2} — ABNORMAL LOW (ref >59)
Sodium: 138 mmol/L (ref 134–144)

## 2012-12-31 ENCOUNTER — Encounter: Payer: Medicare Other | Admitting: Internal Medicine

## 2012-12-31 ENCOUNTER — Ambulatory Visit: Payer: Self-pay | Admitting: General Practice

## 2012-12-31 LAB — URINALYSIS, COMPLETE
Bacteria: NONE SEEN
Glucose,UR: 50 mg/dL (ref 0–75)
Hyaline Cast: 3
Ketone: NEGATIVE
Nitrite: NEGATIVE
Ph: 5 (ref 4.5–8.0)
Specific Gravity: 1.01 (ref 1.003–1.030)
Squamous Epithelial: <1
WBC UR: <1 /HPF (ref 0–5)

## 2012-12-31 LAB — CBC
HGB: 15.8 g/dL (ref 13.0–18.0)
MCH: 32.3 pg (ref 26.0–34.0)
MCV: 98 fL (ref 80–100)
RDW: 17 % — ABNORMAL HIGH (ref 11.5–14.5)
WBC: 10 10*3/uL (ref 3.8–10.6)

## 2012-12-31 LAB — BASIC METABOLIC PANEL
Calcium, Total: 9.4 mg/dL (ref 8.5–10.1)
Chloride: 102 mmol/L (ref 98–107)
Co2: 26 mmol/L (ref 21–32)
Creatinine: 1.4 mg/dL — ABNORMAL HIGH (ref 0.60–1.30)
EGFR (African American): 58 — ABNORMAL LOW
EGFR (Non-African Amer.): 50 — ABNORMAL LOW

## 2012-12-31 LAB — PROTIME-INR: INR: 2

## 2012-12-31 LAB — APTT: Activated PTT: 42.6 secs — ABNORMAL HIGH (ref 23.6–35.9)

## 2013-01-01 ENCOUNTER — Telehealth: Payer: Self-pay | Admitting: *Deleted

## 2013-01-01 NOTE — Telephone Encounter (Signed)
Ca n you schedule this please?

## 2013-01-01 NOTE — Telephone Encounter (Signed)
Pt coming tomorrow for surgical clearance appt/ pt aware

## 2013-01-01 NOTE — Telephone Encounter (Signed)
Needs to be seen in office. Last visit,  he was having issues with heart failure.

## 2013-01-01 NOTE — Telephone Encounter (Signed)
Cleared for surg

## 2013-01-01 NOTE — Telephone Encounter (Signed)
Pt to have procedure on 2/19 and needs clearance for procedure. Fax [phone]

## 2013-01-02 ENCOUNTER — Encounter: Payer: Self-pay | Admitting: Cardiovascular Disease

## 2013-01-02 ENCOUNTER — Ambulatory Visit (INDEPENDENT_AMBULATORY_CARE_PROVIDER_SITE_OTHER): Payer: Medicare Other | Admitting: Cardiovascular Disease

## 2013-01-02 VITALS — BP 108/72 | HR 78 | Ht 70.0 in | Wt 221.8 lb

## 2013-01-02 DIAGNOSIS — Z01810 Encounter for preprocedural cardiovascular examination: Secondary | ICD-10-CM

## 2013-01-02 DIAGNOSIS — I509 Heart failure, unspecified: Secondary | ICD-10-CM

## 2013-01-02 DIAGNOSIS — I4891 Unspecified atrial fibrillation: Secondary | ICD-10-CM

## 2013-01-02 DIAGNOSIS — I5022 Chronic systolic (congestive) heart failure: Secondary | ICD-10-CM

## 2013-01-02 DIAGNOSIS — I2510 Atherosclerotic heart disease of native coronary artery without angina pectoris: Secondary | ICD-10-CM

## 2013-01-02 NOTE — Assessment & Plan Note (Signed)
He appears to be euvolemic on a small dose of Lasix 20 mg daily. He did have slight worsening of renal function which improved after decreasing the dose. Due to that, I don't plan on adding spironolactone at this time especially that his blood pressure is somewhat on the low side. Continue treatment with carvedilol and ramipril.

## 2013-01-02 NOTE — Assessment & Plan Note (Signed)
He has no symptoms suggestive of angina. Cardiac catheterization last year showed no obstructive disease. He does have congestive heart failure and cardiomyopathy with an ejection fraction of 30%. Thus, he is at an overall moderate risk for knee replacement. No further cardiac workup is indicated at this time. I asked him to hold Xarelto 2 days before the surgery. This should be resumed postoperatively when deemed safe. This should help with DVT prophylaxis as well. I asked him to hold Lasix on the day of surgery. Other cardiac medications should be continued.

## 2013-01-02 NOTE — Progress Notes (Signed)
Primary care physician: Dr. Randa Lynn Electrophysiologist: Dr. Graciela Husbands.  HPI  This is a 72 year old male who is here today for a followup visit regarding preoperative cardiovascular evaluation befo a right knee replacement. He has known history of coronary artery disease with nonischemic cardiomyopathy.  He had cardiac catheterization done in April of  last year  which showed patent SVG to OM with severely reduced LV systolic function(EF of 25-30%).  He had an EP study done by Dr. Graciela Husbands which showed induced ventricular tachycardia. He had an ICD placement done. One day after placement, he had atrial fibrillation which was a new diagnosis at that time. He underwent successful cardioversion. He maintained in sinus rhythm but was noted to be in atrial fibrillation in October. His ventricular rate was controlled and he was asymptomatic. I started him on anticoagulation with Xarelto.   during last visit, he was fluid overloaded with increased dyspnea, cough and orthopnea. I started him on Lasix 40 mg once daily which subsequently was decreased to 20 mg once daily. Since then, he reports complete resolution of symptoms. He lost 10 pounds since last visit. He denies any chest pain or dyspnea at this time.   No Known Allergies   Current Outpatient Prescriptions on File Prior to Visit  Medication Sig Dispense Refill  . carvedilol (COREG) 6.25 MG tablet Take 1 tablet (6.25 mg total) by mouth 2 (two) times daily.  60 tablet  11  . Cinnamon 500 MG TABS Take 1,000 mg by mouth 2 (two) times daily.       . Coenzyme Q10 (CO Q 10 PO) Take 300 mg by mouth daily.      . folic acid (FOLVITE) 1 MG tablet Take 1 mg by mouth daily.      . furosemide (LASIX) 40 MG tablet Take 20 mg by mouth daily.      [name] GARLIC PO Take 1 capsule by mouth 2 (two) times daily.       . Glucosamine-Chondroit-Vit C-Mn (GLUCOSAMINE CHONDR 1500 COMPLX PO) Take 1,500 mg by mouth daily.       . insulin regular human CONCENTRATED (HUMULIN R) 500  UNIT/ML SOLN injection 6 Units 3 (three) times daily.       . Metformin HCl 500 MG/5ML SOLN Take 10 mLs (1,000 mg total) by mouth 2 (two) times daily.  120 mL  3  . methotrexate (RHEUMATREX) 2.5 MG tablet Takesixtablets weekly.  12 tablet  0  . Multiple Vitamin (MULTIVITAMIN) capsule Take 1 capsule by mouth daily.      . Omega-3 Fatty Acids (FISH OIL) 1000 MG CAPS Take 1,000 mg by mouth 2 (two) times daily.      . ramipril (ALTACE) 10 MG capsule Take 10 mg by mouth daily.      . Rivaroxaban (XARELTO) 20 MG TABS Take 1 tablet (20 mg total) by mouth daily.  30 tablet  6  . rosuvastatin (CRESTOR) 5 MG tablet Take 2 tablets (10 mg total) by mouth daily.  30 tablet  6     Past Medical History  Diagnosis Date  . Sleep apnea     On CPAP  . Ischemic cardiomyopathy     Status post CABG in 1994; echo/Myoview 2011-12 ejection fraction 30-35%  . Ventricular tachycardia -inducible at EP testing     ppalpitations  . 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 Chronic systolic congestive heart failure   . ICD (implantable cardiac defibrillator) in place   . Type II diabetes  mellitus   . Nephrolithiasis 1970's  . Coronary artery disease   . Hyperlipidemia      Past Surgical History  Procedure Date  . Coronary artery bypass graft 1994    [facility] ; CABG X1  . Cataract extraction w/ intraocular lens  implant, bilateral 2011  . Total knee arthroplasty 2011    left  . Joint replacement   . Knee arthroscopy 2012    right  . Insert / replace / remove pacemaker 02/28/12    "w/defibrillator"  . Cardioversion 03/01/2012    Procedure: CARDIOVERSION;  Surgeon: [name], MD;  Location: [facility] OR;  Service: Cardiovascular;  Laterality: N/A;  . Cardiac catheterization 02/28/2012    Minor disease in LAD and RCA, LCX: 70% mid and occluded distally. Patent SVG to OM3  . Basel cell removal     left arm     Family History  Problem Relation Age of Onset  . Heart attack Father      History   Social  History  . Marital Status: Married    Spouse Name: N/A    Number of Children: 3  . Years of Education: N/A   Occupational History  . Not on file.   Social History Main Topics  . Smoking status: Former Smoker -- 1.0 packs/day for 25 years    Types: Cigarettes    Quit date: 01/09/1988  . Smokeless tobacco: Never Used  . Alcohol Use: No  . Drug Use: No  . Sexually Active: No   Other Topics Concern  . Not on file   Social History Narrative  . No narrative on file     PHYSICAL EXAM   BP 108/72  Pulse 78  Ht 5\' 10"  (1.778 m)  Wt 221 lb 12 oz (100.585 kg)  BMI 31.82 kg/m2  Constitutional: He is oriented to person, place, and time. He appears well-developed and well-nourished. No distress.  HENT: No nasal discharge.  Head: Normocephalic and atraumatic.  Eyes: Pupils are equal and round. Right eye exhibits no discharge. Left eye exhibits no discharge.  Neck: Normal range of motion. Neck supple. No JVD present. No thyromegaly present.  Cardiovascular: Normal rate, irregular rhythm, normal heart sounds and. Exam reveals no gallop and no friction rub. No murmur heard.  Pulmonary/Chest: Effort normal. No stridor. No respiratory distress. He has no wheezes. He has bibasilar crackles more at the right base.  Abdominal: Soft. Bowel sounds are normal. He exhibits no distension. There is no tenderness. There is no rebound and no guarding.  Musculoskeletal: Normal range of motion. He exhibits +1 edema and no tenderness.  Neurological: He is alert and oriented to person, place, and time. Coordination normal.  Skin: Skin is warm and dry. No rash noted. He is not diaphoretic. No erythema. No pallor.  Psychiatric: He has a normal mood and affect. His behavior is normal. Judgment and thought content normal.    EKG: Atrial fibrillation  -RSR(V1) -nondiagnostic.   -  Nonspecific T-abnormality.   Low voltage -possible pulmonary disease.   ABNORMAL    ASSESSMENT AND PLAN

## 2013-01-02 NOTE — Assessment & Plan Note (Signed)
His ventricular rate is controlled and he is overall asymptomatic. Continue current management.

## 2013-01-02 NOTE — Patient Instructions (Addendum)
Continue same medications.  Stop Xarelto 2 days before the surgery.  Do not take Furosemide the day of surgery.  Follow up in 6 months.

## 2013-01-02 NOTE — Assessment & Plan Note (Signed)
No symptoms suggestive of angina. 

## 2013-01-06 ENCOUNTER — Encounter: Payer: Self-pay | Admitting: Internal Medicine

## 2013-01-06 ENCOUNTER — Other Ambulatory Visit: Payer: Self-pay

## 2013-01-06 ENCOUNTER — Ambulatory Visit (INDEPENDENT_AMBULATORY_CARE_PROVIDER_SITE_OTHER): Payer: Medicare Other | Admitting: *Deleted

## 2013-01-06 DIAGNOSIS — I255 Ischemic cardiomyopathy: Secondary | ICD-10-CM

## 2013-01-06 DIAGNOSIS — I2589 Other forms of chronic ischemic heart disease: Secondary | ICD-10-CM

## 2013-01-06 DIAGNOSIS — Z95810 Presence of automatic (implantable) cardiac defibrillator: Secondary | ICD-10-CM

## 2013-01-06 DIAGNOSIS — I5022 Chronic systolic (congestive) heart failure: Secondary | ICD-10-CM

## 2013-01-06 DIAGNOSIS — I509 Heart failure, unspecified: Secondary | ICD-10-CM

## 2013-01-11 LAB — REMOTE ICD DEVICE
BRDY-0002RV: 40 {beats}/min
DEVICE MODEL ICD: 7026078
HV IMPEDENCE: 80 Ohm
TZAT-0001SLOWVT: 1
TZAT-0020SLOWVT: 1 ms
TZON-0005SLOWVT: 6
TZON-0010SLOWVT: 40 ms
TZST-0001SLOWVT: 2
TZST-0001SLOWVT: 4
TZST-0001SLOWVT: 5
TZST-0003SLOWVT: 800 V
TZST-0003SLOWVT: 890 V
TZST-0003SLOWVT: 890 V
VENTRICULAR PACING ICD: 1 pct

## 2013-01-15 ENCOUNTER — Inpatient Hospital Stay: Payer: Self-pay | Admitting: General Practice

## 2013-01-15 LAB — PROTIME-INR: Prothrombin Time: 13.7 secs (ref 11.5–14.7)

## 2013-01-16 ENCOUNTER — Encounter: Payer: Medicare Other | Admitting: Internal Medicine

## 2013-01-16 LAB — BASIC METABOLIC PANEL
Anion Gap: 6 — ABNORMAL LOW (ref 7–16)
BUN: 15 mg/dL (ref 7–18)
Calcium, Total: 7.8 mg/dL — ABNORMAL LOW (ref 8.5–10.1)
Chloride: 104 mmol/L (ref 98–107)
Co2: 27 mmol/L (ref 21–32)
EGFR (African American): 54 — ABNORMAL LOW
EGFR (Non-African Amer.): 47 — ABNORMAL LOW
Osmolality: 284 (ref 275–301)
Potassium: 4.7 mmol/L (ref 3.5–5.1)
Sodium: 137 mmol/L (ref 136–145)

## 2013-01-16 LAB — HEMOGLOBIN: HGB: 12.2 g/dL — ABNORMAL LOW (ref 13.0–18.0)

## 2013-01-16 LAB — PLATELET COUNT: Platelet: 186 10*3/uL (ref 150–440)

## 2013-01-17 LAB — BASIC METABOLIC PANEL
Anion Gap: 10 (ref 7–16)
BUN: 15 mg/dL (ref 7–18)
Chloride: 102 mmol/L (ref 98–107)
EGFR (African American): >60
EGFR (Non-African Amer.): 56 — ABNORMAL LOW
Glucose: 205 mg/dL — ABNORMAL HIGH (ref 65–99)
Osmolality: 281 (ref 275–301)
Potassium: 4.3 mmol/L (ref 3.5–5.1)
Sodium: 137 mmol/L (ref 136–145)

## 2013-01-17 LAB — PLATELET COUNT: Platelet: 205 10*3/uL (ref 150–440)

## 2013-01-17 LAB — HEMOGLOBIN: HGB: 13.4 g/dL (ref 13.0–18.0)

## 2013-01-21 ENCOUNTER — Encounter: Payer: Self-pay | Admitting: *Deleted

## 2013-01-27 ENCOUNTER — Telehealth: Payer: Self-pay | Admitting: *Deleted

## 2013-01-27 ENCOUNTER — Ambulatory Visit: Payer: Medicare Other | Admitting: Cardiovascular Disease

## 2013-01-27 NOTE — Telephone Encounter (Signed)
Please see below what I told pt Is this ok?

## 2013-01-27 NOTE — Telephone Encounter (Signed)
Pt wife calling concerned about pt BP. States that its been running low

## 2013-01-27 NOTE — Telephone Encounter (Signed)
Ok. That's fine.

## 2013-01-27 NOTE — Telephone Encounter (Signed)
Wife says pt had knee replacement 2 weeks ago Says they had to observe him " a little longer than usual" post op d/t hypotension and had to "give him medicine for this" He remains on same medications as pre op Taking lasix 20 mg daily Coreg 6.25 mg BID Ramipril 10 mg daily  C/o low BPs over the last week Is receiving PT in home and PT checks BP each time BP this am=85/51,108/76, unsure of HR Pt c/o dizziness Denies cp or sob Confirms he is drinking plenty of fluids Denies nausea, vomiting or diarrhea Denies bleeding from urine or BM Says knee incision looks well and denies redness, warmth or drainage from incision  I advised wife to have pt decrease coreg to 3.125 mg BID and also may want to try decreasing lasix to 10 mg daily Wife says they have appt with [place] tomm (Dr. Graciela Husbands) for ICD check They will change coreg dose first, will make sure pt stays well hydrated and lay pt flat with legs elevated should BP drop further  Otherwise he will keep appt with [place] to[facility]

## 2013-01-28 ENCOUNTER — Ambulatory Visit (INDEPENDENT_AMBULATORY_CARE_PROVIDER_SITE_OTHER): Payer: Medicare Other | Admitting: Internal Medicine

## 2013-01-28 ENCOUNTER — Encounter: Payer: Self-pay | Admitting: Internal Medicine

## 2013-01-28 VITALS — BP 102/60 | HR 84 | Ht 70.0 in | Wt 210.8 lb

## 2013-01-28 DIAGNOSIS — I255 Ischemic cardiomyopathy: Secondary | ICD-10-CM

## 2013-01-28 DIAGNOSIS — I4819 Other persistent atrial fibrillation: Secondary | ICD-10-CM | POA: Insufficient documentation

## 2013-01-28 DIAGNOSIS — I509 Heart failure, unspecified: Secondary | ICD-10-CM

## 2013-01-28 DIAGNOSIS — I2589 Other forms of chronic ischemic heart disease: Secondary | ICD-10-CM

## 2013-01-28 DIAGNOSIS — I472 Ventricular tachycardia: Secondary | ICD-10-CM

## 2013-01-28 DIAGNOSIS — I4729 Other ventricular tachycardia: Secondary | ICD-10-CM

## 2013-01-28 DIAGNOSIS — I5022 Chronic systolic (congestive) heart failure: Secondary | ICD-10-CM

## 2013-01-28 DIAGNOSIS — I2510 Atherosclerotic heart disease of native coronary artery without angina pectoris: Secondary | ICD-10-CM

## 2013-01-28 DIAGNOSIS — J40 Bronchitis, not specified as acute or chronic: Secondary | ICD-10-CM | POA: Insufficient documentation

## 2013-01-28 LAB — ICD DEVICE OBSERVATION
DEV-0020ICD: NEGATIVE
DEVICE MODEL ICD: 7026078
PACEART VT: 0
RV LEAD IMPEDENCE ICD: 512.5 Ohm
RV LEAD THRESHOLD: 0.75 V
TOT-0008: 0
TOT-0009: 1
TOT-0010: 4
TZAT-0004SLOWVT: 8
TZAT-0012SLOWVT: 200 ms
TZAT-0018SLOWVT: NEGATIVE
TZAT-0019SLOWVT: 7.5 V
TZON-0003SLOWVT: 300 ms
TZON-0004SLOWVT: 45
TZON-0005SLOWVT: 6
TZON-0010SLOWVT: 40 ms
TZST-0001SLOWVT: 3
TZST-0003SLOWVT: 890 V
TZST-0003SLOWVT: 890 V

## 2013-01-28 MED ORDER — AZITHROMYCIN 250 MG PO TABS: ORAL_TABLET | ORAL | Status: DC

## 2013-01-28 MED ORDER — RAMIPRIL 5 MG PO CAPS: 5.0000 mg | ORAL_CAPSULE | Freq: Every day | ORAL | Status: DC

## 2013-01-28 NOTE — Patient Instructions (Addendum)
Your physician wants you to follow-up in: 12 months with Dr. Graciela Husbands. Transmission 04/28/13. You will receive a reminder letter in the mail two months in advance. If you don't receive a letter, please call our office to schedule the follow-up appointment.  Your physician has recommended you make the following change in your medication:  -decrease ramipril to 5 mg every evening -start Zpack

## 2013-01-28 NOTE — Assessment & Plan Note (Signed)
Will treat him with a Z-Pak. I have alerted his PCP

## 2013-01-28 NOTE — Assessment & Plan Note (Signed)
No intercurrent ventricular tachycardia 

## 2013-01-28 NOTE — Assessment & Plan Note (Signed)
Long term. He failed cardioversion about a year ago.left atrial dimension 2012 was 3.9. Consideration could be given to antiarrhythmic therapy if we think this is contributing to his symptoms

## 2013-01-28 NOTE — Assessment & Plan Note (Signed)
Stable in euvolemic

## 2013-01-28 NOTE — Assessment & Plan Note (Signed)
The patient's device was interrogated.  The information was reviewed. No changes were made in the programming.    

## 2013-01-28 NOTE — Progress Notes (Signed)
Patient Care Team: Andres Cagey, MD as PCP - General (Internal Medicine)   HPI  Andres [name] is a 72 y.o. male Seen in followup for ICD implanted for ischemic cardiomyopathy palpitations and inducible ventricular tachycardia -April 2013  The patient denies chest pain, shortness of breath, nocturnal dyspnea, orthopnea or peripheral edema. There have been no palpitations, lightheadedness or syncope.  1  Past Medical History  Diagnosis Date  . Sleep apnea     On CPAP  . Ischemic cardiomyopathy     Status post CABG in 1994; echo/Myoview 2011-12 ejection fraction 30-35%  . Ventricular tachycardia -inducible at EP testing     ppalpitations  . 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 Chronic systolic congestive heart failure   . ICD (implantable cardiac defibrillator) in place   . Type II diabetes mellitus   . Nephrolithiasis 1970's  . Coronary artery disease   . Hyperlipidemia     Past Surgical History  Procedure Laterality Date  . Coronary artery bypass graft  1994    [facility] ; CABG X1  . Cataract extraction w/ intraocular lens  implant, bilateral  2011  . Total knee arthroplasty  2011    left  . Joint replacement    . Knee arthroscopy  2012    right  . Insert / replace / remove pacemaker  02/28/12    "w/defibrillator"  . Cardioversion  03/01/2012    Procedure: CARDIOVERSION;  Surgeon: [name], MD;  Location: [facility] OR;  Service: Cardiovascular;  Laterality: N/A;  . Cardiac catheterization  02/28/2012    Minor disease in LAD and RCA, LCX: 70% mid and occluded distally. Patent SVG to OM3  . Basel cell removal      left arm    Current Outpatient Prescriptions  Medication Sig Dispense Refill  . carvedilol (COREG) 6.25 MG tablet Take 3.125 mg by mouth 2 (two) times daily.      . Cinnamon 500 MG TABS Take 1,000 mg by mouth 2 (two) times daily.       . Coenzyme Q10 (CO Q 10 PO) Take 300 mg by mouth daily.      . folic acid (FOLVITE) 1 MG tablet Take 1 mg by mouth daily.       . furosemide (LASIX) 40 MG tablet Take 20 mg by mouth daily.      [name] GARLIC PO Take 1 capsule by mouth 2 (two) times daily.       . Glucosamine-Chondroit-Vit C-Mn (GLUCOSAMINE CHONDR 1500 COMPLX PO) Take 1,500 mg by mouth daily.       . insulin regular human CONCENTRATED (HUMULIN R) 500 UNIT/ML SOLN injection 6 Units 3 (three) times daily.       . Metformin HCl 500 MG/5ML SOLN Take 10 mLs (1,000 mg total) by mouth 2 (two) times daily.  120 mL  3  . methotrexate (RHEUMATREX) 2.5 MG tablet Takesixtablets weekly.  12 tablet  0  . Multiple Vitamin (MULTIVITAMIN) capsule Take 1 capsule by mouth daily.      . Omega-3 Fatty Acids (FISH OIL) 1000 MG CAPS Take 1,000 mg by mouth 2 (two) times daily.      . ramipril (ALTACE) 10 MG capsule Take 10 mg by mouth daily.      . Rivaroxaban (XARELTO) 20 MG TABS Take 1 tablet (20 mg total) by mouth daily.  30 tablet  6  . rosuvastatin (CRESTOR) 5 MG tablet Take 2 tablets (10 mg total) by mouth daily.  30 tablet  6  . traMADol (ULTRAM) 50 MG tablet Take 50 mg by mouth every 6 (six) hours as needed for pain.      . fluticasone (FLONASE) 50 MCG/ACT nasal spray        No current facility-administered medications for this visit.    No Known Allergies  Review of Systems negative except from HPI and PMH  Physical Exam BP 102/60  Pulse 84  Ht 5\' 10"  (1.778 m)  Wt 210 lb 12 oz (95.596 kg)  BMI 30.24 kg/m2 Well developed and well nourished in no acute distress HENT normal E scleral and icterus clear Neck Supple JVP flat; carotids brisk and full Clear to ausculation Irregularly irregular rate and rhythm, no murmurs gallops or rub Soft with active bowel sounds No clubbing cyanosis none Edema Alert and oriented, grossly normal motor and sensory function Skin Warm and Dry   ECG demonstrates atrial fibrillation 8 Intervals-/10-37 Axis XLII Nonspecific ST changes  Assessment and  Plan  .

## 2013-01-30 ENCOUNTER — Telehealth: Payer: Self-pay

## 2013-01-30 NOTE — Telephone Encounter (Signed)
Pts wife says pt was sitting in recliner and began to c/o "seeing spots".  She checked blood sugar. It was 138. BP=85/54. He was not dizzy, just "seeing spots" He got out of recliner and began to ambulate around home, BP came up to 101/64 Not seeing spots right now  Continues to take coreg 3.125 mg BID, ramipril 5 mg qam and lasix 20 mg qam I advised her to have pt hold ramipril for now and I will discuss with Dr. Kirke Corin and call her back  Denies blood in urine/stool, denies tachycardia

## 2013-01-30 NOTE — Telephone Encounter (Signed)
Pt wife called , [name], and states pt is started seeing spots (just now) bp is 85/54. Blood sugar is 138. Wanted to know if this was coming from the change in medication dosage. Please advise.

## 2013-01-31 NOTE — Telephone Encounter (Signed)
Hold Lasix and Ramipril until BP improves. Check BMP to make sure he is not dehydrated.

## 2013-02-03 NOTE — Telephone Encounter (Signed)
No answer

## 2013-02-05 ENCOUNTER — Telehealth: Payer: Self-pay | Admitting: *Deleted

## 2013-02-05 ENCOUNTER — Other Ambulatory Visit: Payer: Self-pay

## 2013-02-05 DIAGNOSIS — I959 Hypotension, unspecified: Secondary | ICD-10-CM

## 2013-02-05 NOTE — Telephone Encounter (Signed)
Pt wife called stating that pt BP is running low. He has stopped his lopressor

## 2013-02-05 NOTE — Telephone Encounter (Signed)
Wife says pt's BP is running "low" 113/75,110/69, 111/73, 127/83, 81/60, 113/75 Wife is questioning the validity of his wrist BP cuff He continues to hold lisinopril Not holding lopressor I reassured wife these BP readings are actually really good She says he has had no further episodes of "seeing spots"  I informed her of Dr. Jari Sportsman suggestion to get BMP He will come in after PT session Friday 3/14 for BMP and BP check to compare BP machines

## 2013-02-05 NOTE — Telephone Encounter (Signed)
Pt wife called stating that pt BP is running low. He has stopped his lopressor 

## 2013-02-06 ENCOUNTER — Other Ambulatory Visit: Payer: Medicare Other

## 2013-02-07 ENCOUNTER — Ambulatory Visit (INDEPENDENT_AMBULATORY_CARE_PROVIDER_SITE_OTHER): Payer: Medicare Other

## 2013-02-07 VITALS — BP 109/67

## 2013-02-07 DIAGNOSIS — I2510 Atherosclerotic heart disease of native coronary artery without angina pectoris: Secondary | ICD-10-CM

## 2013-02-07 DIAGNOSIS — I4891 Unspecified atrial fibrillation: Secondary | ICD-10-CM

## 2013-02-07 DIAGNOSIS — I4819 Other persistent atrial fibrillation: Secondary | ICD-10-CM

## 2013-02-07 DIAGNOSIS — I959 Hypotension, unspecified: Secondary | ICD-10-CM

## 2013-02-07 NOTE — Progress Notes (Signed)
pts wife informed Understanding verb 

## 2013-02-07 NOTE — Patient Instructions (Addendum)
We will call you with BMP results

## 2013-02-07 NOTE — Progress Notes (Signed)
Pt here for BP check to compare home BP monitor with our manual cuff Still c/o occassional dizziness Remains on lasix 20 mg daily Ramipril was stopped  Home Bp monitor=109/67 Our cuff=100/62  Will check BMP today and have Dr. Kirke Corin review numbers

## 2013-02-08 LAB — BASIC METABOLIC PANEL
Calcium: 9.3 mg/dL (ref 8.6–10.2)
GFR calc non Af Amer: 45 mL/min/{1.73_m2} — ABNORMAL LOW (ref >59)
Potassium: 4.6 mmol/L (ref 3.5–5.2)
Sodium: 134 mmol/L (ref 134–144)

## 2013-02-27 ENCOUNTER — Ambulatory Visit: Payer: Medicare Other | Admitting: Cardiovascular Disease

## 2013-03-03 ENCOUNTER — Ambulatory Visit (INDEPENDENT_AMBULATORY_CARE_PROVIDER_SITE_OTHER): Payer: Medicare Other | Admitting: Cardiovascular Disease

## 2013-03-03 ENCOUNTER — Encounter: Payer: Self-pay | Admitting: Cardiovascular Disease

## 2013-03-03 VITALS — BP 120/70 | HR 88 | Ht 70.0 in | Wt 213.8 lb

## 2013-03-03 DIAGNOSIS — I2510 Atherosclerotic heart disease of native coronary artery without angina pectoris: Secondary | ICD-10-CM

## 2013-03-03 DIAGNOSIS — I509 Heart failure, unspecified: Secondary | ICD-10-CM

## 2013-03-03 DIAGNOSIS — I5022 Chronic systolic (congestive) heart failure: Secondary | ICD-10-CM

## 2013-03-03 DIAGNOSIS — I4819 Other persistent atrial fibrillation: Secondary | ICD-10-CM

## 2013-03-03 DIAGNOSIS — I4891 Unspecified atrial fibrillation: Secondary | ICD-10-CM

## 2013-03-03 NOTE — Assessment & Plan Note (Signed)
He seems to be euvolemic at this time. Hypotension resolved. I asked him to resume taking ramipril and monitor his blood pressure over the next few days. He is supposed to be on small dose Lasix 20 mg once daily. He seems to have some confusion about his medication and he was provided with a list of his medications that he is supposed to be on. He had labs done this morning at [facility] clinic. I will request these results. If renal function has not been checked, he will need basic metabolic profile.

## 2013-03-03 NOTE — Progress Notes (Signed)
Primary care physician: Dr. Randa Lynn Electrophysiologist: Dr. Graciela Husbands.  HPI  This is a 72 year old male who is here today for a followup . He has known history of coronary artery disease with nonischemic cardiomyopathy.  He had cardiac catheterization done in April of 2013 which showed patent SVG to OM with severely reduced LV systolic function(EF of 25-30%).  He had an EP study done by Dr. Graciela Husbands which showed induced ventricular tachycardia. He had an ICD placement done. One day after placement, he had atrial fibrillation which was a new diagnosis at that time. He underwent successful cardioversion. He maintained in sinus rhythm but was noted to be in atrial fibrillation in October. His ventricular rate was controlled and he was asymptomatic. I started him on anticoagulation with Xarelto.  He underwent recent right knee replacement. Postoperatively, he had issues with low blood pressure and fatigue. We decreased the dose of carvedilol and held ramipril. His labs showed slight worsening of renal function. Over the last few weeks, he has been feeling well with no further hypotension. He denies any chest pain or significant dyspnea.  No Known Allergies   Current Outpatient Prescriptions on File Prior to Visit  Medication Sig Dispense Refill  . azithromycin (ZITHROMAX Z-PAK) 250 MG tablet Take 2 tablets first day then 1 tablet daily  6 each  0  . carvedilol (COREG) 6.25 MG tablet Take 3.125 mg by mouth 2 (two) times daily.      . Cinnamon 500 MG TABS Take 1,000 mg by mouth 2 (two) times daily.       . Coenzyme Q10 (CO Q 10 PO) Take 300 mg by mouth daily.      . fluticasone (FLONASE) 50 MCG/ACT nasal spray       . folic acid (FOLVITE) 1 MG tablet Take 1 mg by mouth daily.      . furosemide (LASIX) 40 MG tablet Take 20 mg by mouth daily.      [name] GARLIC PO Take 1 capsule by mouth 2 (two) times daily.       . Glucosamine-Chondroit-Vit C-Mn (GLUCOSAMINE CHONDR 1500 COMPLX PO) Take 1,500 mg by mouth daily.        . insulin regular human CONCENTRATED (HUMULIN R) 500 UNIT/ML SOLN injection 6 Units 3 (three) times daily.       . Metformin HCl 500 MG/5ML SOLN Take 10 mLs (1,000 mg total) by mouth 2 (two) times daily.  120 mL  3  . methotrexate (RHEUMATREX) 2.5 MG tablet Takesixtablets weekly.  12 tablet  0  . Multiple Vitamin (MULTIVITAMIN) capsule Take 1 capsule by mouth daily.      . Omega-3 Fatty Acids (FISH OIL) 1000 MG CAPS Take 1,000 mg by mouth 2 (two) times daily.      . Rivaroxaban (XARELTO) 20 MG TABS Take 1 tablet (20 mg total) by mouth daily.  30 tablet  6  . rosuvastatin (CRESTOR) 5 MG tablet Take 2 tablets (10 mg total) by mouth daily.  30 tablet  6   No current facility-administered medications on file prior to visit.     Past Medical History  Diagnosis Date  . Sleep apnea     On CPAP  . Ischemic cardiomyopathy     Status post CABG in 1994; echo/Myoview 2011-12 ejection fraction 30-35%  . Ventricular tachycardia -inducible at EP testing     ppalpitations  . 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 Chronic systolic congestive heart failure   . ICD (implantable  cardiac defibrillator) in place   . Type II diabetes mellitus   . Nephrolithiasis 1970's  . Coronary artery disease   . Hyperlipidemia   . Atrial fibrillation, persistent-long-term      Past Surgical History  Procedure Laterality Date  . Coronary artery bypass graft  1994    [facility] ; CABG X1  . Cataract extraction w/ intraocular lens  implant, bilateral  2011  . Total knee arthroplasty  2011    left  . Joint replacement    . Knee arthroscopy  2012    right  . Insert / replace / remove pacemaker  02/28/12    "w/defibrillator"  . Cardioversion  03/01/2012    Procedure: CARDIOVERSION;  Surgeon: [name], MD;  Location: [facility] OR;  Service: Cardiovascular;  Laterality: N/A;  . Cardiac catheterization  02/28/2012    Minor disease in LAD and RCA, LCX: 70% mid and occluded distally. Patent SVG to OM3  . Basel cell  removal      left arm     Family History  Problem Relation Age of Onset  . Heart attack Father      History   Social History  . Marital Status: Married    Spouse Name: N/A    Number of Children: 3  . Years of Education: N/A   Occupational History  . Not on file.   Social History Main Topics  . Smoking status: Former Smoker -- 1.00 packs/day for 25 years    Types: Cigarettes    Quit date: 01/09/1988  . Smokeless tobacco: Never Used  . Alcohol Use: No  . Drug Use: No  . Sexually Active: No   Other Topics Concern  . Not on file   Social History Narrative  . No narrative on file     PHYSICAL EXAM   BP 120/70  Pulse 88  Ht 5\' 10"  (1.778 m)  Wt 213 lb 12.8 oz (96.979 kg)  BMI 30.68 kg/m2  Constitutional: He is oriented to person, place, and time. He appears well-developed and well-nourished. No distress.  HENT: No nasal discharge.  Head: Normocephalic and atraumatic.  Eyes: Pupils are equal and round. Right eye exhibits no discharge. Left eye exhibits no discharge.  Neck: Normal range of motion. Neck supple. No JVD present. No thyromegaly present.  Cardiovascular: Normal rate, irregular rhythm, normal heart sounds and. Exam reveals no gallop and no friction rub. No murmur heard.  Pulmonary/Chest: Effort normal. No stridor. No respiratory distress. He has no wheezes. He has bibasilar crackles more at the right base.  Abdominal: Soft. Bowel sounds are normal. He exhibits no distension. There is no tenderness. There is no rebound and no guarding.  Musculoskeletal: Normal range of motion. He exhibits +1 edema and no tenderness.  Neurological: He is alert and oriented to person, place, and time. Coordination normal.  Skin: Skin is warm and dry. No rash noted. He is not diaphoretic. No erythema. No pallor.  Psychiatric: He has a normal mood and affect. His behavior is normal. Judgment and thought content normal.    ASSESSMENT AND PLAN

## 2013-03-03 NOTE — Assessment & Plan Note (Signed)
He has no symptoms of angina. Continue medical therapy. 

## 2013-03-03 NOTE — Assessment & Plan Note (Signed)
Continue long-term anticoagulation and rate control with carvedilol. I might consider adding small dose digoxin if additional rate control is needed given that his blood pressure has been borderline low.

## 2013-03-03 NOTE — Patient Instructions (Addendum)
Obtain results of blood work from [place] [facility] (it was done this morning) Continue medications listed on your list.  Follow up in 3 months.

## 2013-03-10 ENCOUNTER — Other Ambulatory Visit: Payer: Self-pay

## 2013-03-10 ENCOUNTER — Telehealth: Payer: Self-pay

## 2013-03-10 DIAGNOSIS — I255 Ischemic cardiomyopathy: Secondary | ICD-10-CM

## 2013-03-10 NOTE — Telephone Encounter (Signed)
Pt informed He will come in tomm for BMP

## 2013-03-10 NOTE — Telephone Encounter (Signed)
Check BMP .  

## 2013-03-10 NOTE — Telephone Encounter (Signed)
I called to get labs from Dr. Renae Fickle at [facility] from 4/7 I was told only labs he had drawn were HgbA1c and albumin. Do we need to get further lab work?

## 2013-03-11 ENCOUNTER — Ambulatory Visit (INDEPENDENT_AMBULATORY_CARE_PROVIDER_SITE_OTHER): Payer: Medicare Other

## 2013-03-11 DIAGNOSIS — I255 Ischemic cardiomyopathy: Secondary | ICD-10-CM

## 2013-03-11 DIAGNOSIS — I2589 Other forms of chronic ischemic heart disease: Secondary | ICD-10-CM

## 2013-03-12 LAB — BASIC METABOLIC PANEL
BUN/Creatinine Ratio: 11 (ref 10–22)
Calcium: 8.9 mg/dL (ref 8.6–10.2)
GFR calc Af Amer: 53 mL/min/{1.73_m2} — ABNORMAL LOW (ref >59)
GFR calc non Af Amer: 46 mL/min/{1.73_m2} — ABNORMAL LOW (ref >59)
Potassium: 5.3 mmol/L — ABNORMAL HIGH (ref 3.5–5.2)
Sodium: 140 mmol/L (ref 134–144)

## 2013-04-15 ENCOUNTER — Telehealth: Payer: Self-pay

## 2013-04-15 NOTE — Telephone Encounter (Signed)
Needs to confirm CHF dx and ejection fraction. Please call

## 2013-04-16 NOTE — Telephone Encounter (Signed)
Returning you call again, states you can leave info in voicemail.

## 2013-04-16 NOTE — Telephone Encounter (Signed)
lmtcb

## 2013-04-16 NOTE — Telephone Encounter (Signed)
LMOM [id number] requested

## 2013-04-28 ENCOUNTER — Encounter: Payer: Self-pay | Admitting: Internal Medicine

## 2013-04-28 ENCOUNTER — Ambulatory Visit (INDEPENDENT_AMBULATORY_CARE_PROVIDER_SITE_OTHER): Payer: Medicare Other | Admitting: *Deleted

## 2013-04-28 DIAGNOSIS — Z95810 Presence of automatic (implantable) cardiac defibrillator: Secondary | ICD-10-CM

## 2013-04-28 DIAGNOSIS — I2589 Other forms of chronic ischemic heart disease: Secondary | ICD-10-CM

## 2013-04-28 DIAGNOSIS — I255 Ischemic cardiomyopathy: Secondary | ICD-10-CM

## 2013-04-28 DIAGNOSIS — I509 Heart failure, unspecified: Secondary | ICD-10-CM

## 2013-04-28 DIAGNOSIS — I5022 Chronic systolic (congestive) heart failure: Secondary | ICD-10-CM

## 2013-04-28 LAB — REMOTE ICD DEVICE
DEV-0020ICD: NEGATIVE
HV IMPEDENCE: 79 Ohm
RV LEAD IMPEDENCE ICD: 440 Ohm
TZAT-0004SLOWVT: 8
TZAT-0012SLOWVT: 200 ms
TZAT-0013SLOWVT: 2
TZAT-0019SLOWVT: 7.5 V
TZAT-0020SLOWVT: 1 ms
TZON-0003SLOWVT: 300 ms
TZON-0004SLOWVT: 45
TZON-0005SLOWVT: 6
TZST-0001SLOWVT: 3
TZST-0001SLOWVT: 4
TZST-0003SLOWVT: 890 V
TZST-0003SLOWVT: 890 V

## 2013-04-29 ENCOUNTER — Other Ambulatory Visit: Payer: Self-pay | Admitting: *Deleted

## 2013-04-29 MED ORDER — CARVEDILOL 6.25 MG PO TABS: 3.1250 mg | ORAL_TABLET | Freq: Two times a day (BID) | ORAL | Status: DC

## 2013-04-29 NOTE — Telephone Encounter (Signed)
Refilled Carvedilol #60 Refill#3 sent to [facility].

## 2013-05-12 ENCOUNTER — Other Ambulatory Visit: Payer: Self-pay

## 2013-05-12 DIAGNOSIS — I4891 Unspecified atrial fibrillation: Secondary | ICD-10-CM

## 2013-05-12 MED ORDER — RIVAROXABAN 20 MG PO TABS: 20.0000 mg | ORAL_TABLET | Freq: Every day | ORAL | Status: DC

## 2013-05-12 NOTE — Telephone Encounter (Signed)
Refill sent xarelto 20 mg

## 2013-05-19 ENCOUNTER — Encounter: Payer: Self-pay | Admitting: *Deleted

## 2013-06-04 DIAGNOSIS — Z85828 Personal history of other malignant neoplasm of skin: Secondary | ICD-10-CM

## 2013-06-04 DIAGNOSIS — C4492 Squamous cell carcinoma of skin, unspecified: Secondary | ICD-10-CM

## 2013-06-04 HISTORY — DX: Personal history of other malignant neoplasm of skin: Z85.828

## 2013-06-04 HISTORY — DX: Squamous cell carcinoma of skin, unspecified: C44.92

## 2013-08-04 ENCOUNTER — Ambulatory Visit (INDEPENDENT_AMBULATORY_CARE_PROVIDER_SITE_OTHER): Payer: Medicare Other | Admitting: *Deleted

## 2013-08-04 DIAGNOSIS — I5022 Chronic systolic (congestive) heart failure: Secondary | ICD-10-CM

## 2013-08-04 DIAGNOSIS — I2589 Other forms of chronic ischemic heart disease: Secondary | ICD-10-CM

## 2013-08-04 DIAGNOSIS — I509 Heart failure, unspecified: Secondary | ICD-10-CM

## 2013-08-04 DIAGNOSIS — I255 Ischemic cardiomyopathy: Secondary | ICD-10-CM

## 2013-08-05 LAB — REMOTE ICD DEVICE
DEV-0020ICD: NEGATIVE
HV IMPEDENCE: 80 Ohm
RV LEAD IMPEDENCE ICD: 440 Ohm
TZAT-0001SLOWVT: 1
TZAT-0012SLOWVT: 200 ms
TZAT-0013SLOWVT: 2
TZAT-0019SLOWVT: 7.5 V
TZAT-0020SLOWVT: 1 ms
TZON-0004SLOWVT: 45
TZON-0005SLOWVT: 6
TZST-0001SLOWVT: 4
TZST-0003SLOWVT: 890 V
TZST-0003SLOWVT: 890 V

## 2013-08-12 ENCOUNTER — Telehealth: Payer: Self-pay | Admitting: *Deleted

## 2013-08-12 NOTE — Telephone Encounter (Signed)
Lmom to call our office its time to schedule an appt with Dr. Kirke Corin

## 2013-08-28 ENCOUNTER — Ambulatory Visit: Payer: Medicare Other | Admitting: Cardiovascular Disease

## 2013-09-01 ENCOUNTER — Encounter: Payer: Self-pay | Admitting: *Deleted

## 2013-09-04 ENCOUNTER — Encounter: Payer: Self-pay | Admitting: Cardiovascular Disease

## 2013-09-04 ENCOUNTER — Ambulatory Visit (INDEPENDENT_AMBULATORY_CARE_PROVIDER_SITE_OTHER): Payer: Medicare Other | Admitting: Cardiovascular Disease

## 2013-09-04 VITALS — BP 109/73 | HR 78 | Ht 70.0 in | Wt 212.2 lb

## 2013-09-04 DIAGNOSIS — E785 Hyperlipidemia, unspecified: Secondary | ICD-10-CM

## 2013-09-04 DIAGNOSIS — I2510 Atherosclerotic heart disease of native coronary artery without angina pectoris: Secondary | ICD-10-CM

## 2013-09-04 DIAGNOSIS — I4891 Unspecified atrial fibrillation: Secondary | ICD-10-CM

## 2013-09-04 DIAGNOSIS — I4819 Other persistent atrial fibrillation: Secondary | ICD-10-CM

## 2013-09-04 DIAGNOSIS — R000 Tachycardia, unspecified: Secondary | ICD-10-CM

## 2013-09-04 DIAGNOSIS — I5022 Chronic systolic (congestive) heart failure: Secondary | ICD-10-CM

## 2013-09-04 DIAGNOSIS — I509 Heart failure, unspecified: Secondary | ICD-10-CM

## 2013-09-04 NOTE — Assessment & Plan Note (Signed)
Continue medical therapy. No symptoms of angina.

## 2013-09-04 NOTE — Assessment & Plan Note (Signed)
Ventricular rate is controlled. Continue long-term anticoagulation which is currently well-tolerated.

## 2013-09-04 NOTE — Patient Instructions (Signed)
Continue same medications.   Your physician wants you to follow-up in: 6 months.  You will receive a reminder letter in the mail two months in advance. If you don't receive a letter, please call our office to schedule the follow-up appointment.  

## 2013-09-04 NOTE — Progress Notes (Signed)
Electrophysiologist: Dr. Graciela Husbands.  HPI  This is a 72 year old male who is here today for a followup . He has known history of coronary artery disease with nonischemic cardiomyopathy and chronic atrial fibrillation.  He had cardiac catheterization done in April of 2013 which showed patent SVG to OM with severely reduced LV systolic function(EF of 25-30%).  He had an EP study done by Dr. Graciela Husbands which showed induced ventricular tachycardia. He had an ICD placement done. One day after placement, he had atrial fibrillation which was a new diagnosis at that time. He underwent successful cardioversion. He maintained in sinus rhythm but was noted to be in atrial fibrillation in October. His ventricular rate was controlled and he was asymptomatic. I started him on anticoagulation with Xarelto and continued with rate control. He had problems with hypotension after knee replacement but that has resolved. He is back to his baseline. He is feeling very well and denies any chest pain. Dyspnea is stable. He continues to enjoy golf.  No Known Allergies   Current Outpatient Prescriptions on File Prior to Visit  Medication Sig Dispense Refill  . carvedilol (COREG) 6.25 MG tablet Take 0.5 tablets (3.125 mg total) by mouth 2 (two) times daily with a meal.  60 tablet  3  . Cinnamon 500 MG TABS Take 1,000 mg by mouth 2 (two) times daily.       . Coenzyme Q10 (CO Q 10 PO) Take 300 mg by mouth daily.      . fluticasone (FLONASE) 50 MCG/ACT nasal spray       . folic acid (FOLVITE) 1 MG tablet Take 1 mg by mouth daily.      . furosemide (LASIX) 40 MG tablet Take 20 mg by mouth daily.      [name] GARLIC PO Take 1 capsule by mouth 2 (two) times daily.       . Glucosamine-Chondroit-Vit C-Mn (GLUCOSAMINE CHONDR 1500 COMPLX PO) Take 1,500 mg by mouth daily.       . insulin regular human CONCENTRATED (HUMULIN R) 500 UNIT/ML SOLN injection 6 Units 3 (three) times daily.       . Metformin HCl 500 MG/5ML SOLN Take 10 mLs (1,000 mg  total) by mouth 2 (two) times daily.  120 mL  3  . Multiple Vitamin (MULTIVITAMIN) capsule Take 1 capsule by mouth daily.      . Omega-3 Fatty Acids (FISH OIL) 1000 MG CAPS Take 1,000 mg by mouth 2 (two) times daily.      . ramipril (ALTACE) 5 MG capsule Take 1 capsule by mouth daily.      . Rivaroxaban (XARELTO) 20 MG TABS Take 1 tablet (20 mg total) by mouth daily.  30 tablet  6  . rosuvastatin (CRESTOR) 5 MG tablet Take 2 tablets (10 mg total) by mouth daily.  30 tablet  6  . ZETIA 10 MG tablet Take 1 tablet by mouth daily.       No current facility-administered medications on file prior to visit.     Past Medical History  Diagnosis Date  . Sleep apnea     On CPAP  . Ischemic cardiomyopathy     Status post CABG in 1994; echo/Myoview 2011-12 ejection fraction 30-35%  . Ventricular tachycardia -inducible at EP testing     ppalpitations  . 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 Chronic systolic congestive heart failure   . ICD (implantable cardiac defibrillator) in place   . Type II diabetes  mellitus   . Nephrolithiasis 1970's  . Coronary artery disease   . Hyperlipidemia   . Atrial fibrillation, persistent-long-term      Past Surgical History  Procedure Laterality Date  . Coronary artery bypass graft  1994    [facility] ; CABG X1  . Cataract extraction w/ intraocular lens  implant, bilateral  2011  . Total knee arthroplasty  2011    left  . Joint replacement    . Knee arthroscopy  2012    right  . Insert / replace / remove pacemaker  02/28/12    "w/defibrillator"  . Cardioversion  03/01/2012    Procedure: CARDIOVERSION;  Surgeon: [name], MD;  Location: [facility] OR;  Service: Cardiovascular;  Laterality: N/A;  . Cardiac catheterization  02/28/2012    Minor disease in LAD and RCA, LCX: 70% mid and occluded distally. Patent SVG to OM3  . Basel cell removal      left arm     Family History  Problem Relation Age of Onset  . Heart attack Father      History   Social  History  . Marital Status: Married    Spouse Name: N/A    Number of Children: 3  . Years of Education: N/A   Occupational History  . Not on file.   Social History Main Topics  . Smoking status: Former Smoker -- 1.00 packs/day for 25 years    Types: Cigarettes    Quit date: 01/09/1988  . Smokeless tobacco: Never Used  . Alcohol Use: No  . Drug Use: No  . Sexual Activity: No   Other Topics Concern  . Not on file   Social History Narrative  . No narrative on file     PHYSICAL EXAM   BP 109/73  Pulse 78  Ht 5\' 10"  (1.778 m)  Wt 212 lb 4 oz (96.276 kg)  BMI 30.45 kg/m2  Constitutional: He is oriented to person, place, and time. He appears well-developed and well-nourished. No distress.  HENT: No nasal discharge.  Head: Normocephalic and atraumatic.  Eyes: Pupils are equal and round. Right eye exhibits no discharge. Left eye exhibits no discharge.  Neck: Normal range of motion. Neck supple. No JVD present. No thyromegaly present.  Cardiovascular: Normal rate, irregular rhythm, normal heart sounds and. Exam reveals no gallop and no friction rub. No murmur heard.  Pulmonary/Chest: Effort normal. No stridor. No respiratory distress. He has no wheezes. He has bibasilar crackles more at the right base.  Abdominal: Soft. Bowel sounds are normal. He exhibits no distension. There is no tenderness. There is no rebound and no guarding.  Musculoskeletal: Normal range of motion. He exhibits +1 edema and no tenderness.  Neurological: He is alert and oriented to person, place, and time. Coordination normal.  Skin: Skin is warm and dry. No rash noted. He is not diaphoretic. No erythema. No pallor.  Psychiatric: He has a normal mood and affect. His behavior is normal. Judgment and thought content normal.   MWU:XLKGMW flutter-fibrillation  Low voltage in precordial leads.   -Nonspecific QRS widening.   -  Nonspecific T-abnormality.   ABNORMAL     ASSESSMENT AND PLAN

## 2013-09-04 NOTE — Assessment & Plan Note (Signed)
He seems to be euvolemic at this time. Hypotension resolved. Continue treatment with Ramipril and carvedilol. He is on Lasix 20 mg once daily.

## 2013-09-04 NOTE — Assessment & Plan Note (Signed)
Continue treatment with rosuvastatin. This is being managed by his primary care physician. I'm not sure why he is on Zetia.

## 2013-10-06 ENCOUNTER — Other Ambulatory Visit: Payer: Self-pay

## 2013-10-06 ENCOUNTER — Encounter: Payer: Self-pay | Admitting: Internal Medicine

## 2013-10-06 MED ORDER — FUROSEMIDE 40 MG PO TABS: 20.0000 mg | ORAL_TABLET | Freq: Every day | ORAL | Status: DC

## 2013-10-06 NOTE — Telephone Encounter (Signed)
Refill sent for Lasix 20 mg take one tablet.

## 2013-11-03 ENCOUNTER — Encounter: Payer: Self-pay | Admitting: Internal Medicine

## 2013-11-03 ENCOUNTER — Ambulatory Visit (INDEPENDENT_AMBULATORY_CARE_PROVIDER_SITE_OTHER): Payer: Medicare Other | Admitting: *Deleted

## 2013-11-03 DIAGNOSIS — I255 Ischemic cardiomyopathy: Secondary | ICD-10-CM

## 2013-11-03 DIAGNOSIS — I2589 Other forms of chronic ischemic heart disease: Secondary | ICD-10-CM

## 2013-11-03 DIAGNOSIS — I5022 Chronic systolic (congestive) heart failure: Secondary | ICD-10-CM

## 2013-11-03 DIAGNOSIS — I509 Heart failure, unspecified: Secondary | ICD-10-CM

## 2013-11-10 LAB — MDC_IDC_ENUM_SESS_TYPE_REMOTE
Brady Statistic RV Percent Paced: 1 %
HighPow Impedance: 79 Ohm
Lead Channel Impedance Value: 480 Ohm
Lead Channel Impedance Value: 480 Ohm
Lead Channel Setting Pacing Pulse Width: 0.5 ms
Zone Setting Detection Interval: 250 ms

## 2013-11-25 ENCOUNTER — Encounter: Payer: Self-pay | Admitting: *Deleted

## 2013-12-02 ENCOUNTER — Other Ambulatory Visit: Payer: Self-pay | Admitting: *Deleted

## 2013-12-02 DIAGNOSIS — I4891 Unspecified atrial fibrillation: Secondary | ICD-10-CM

## 2013-12-02 MED ORDER — RIVAROXABAN 20 MG PO TABS: 20.0000 mg | ORAL_TABLET | Freq: Every day | ORAL | Status: DC

## 2013-12-02 NOTE — Telephone Encounter (Signed)
Requested Prescriptions   Signed Prescriptions Disp Refills  . Rivaroxaban (XARELTO) 20 MG TABS tablet 30 tablet 6    Sig: Take 1 tablet (20 mg total) by mouth daily.    Authorizing Provider: Kathlyn Sacramento A    Ordering User: Britt Bottom

## 2014-01-02 ENCOUNTER — Other Ambulatory Visit: Payer: Self-pay | Admitting: *Deleted

## 2014-01-02 MED ORDER — CARVEDILOL 6.25 MG PO TABS: 3.1250 mg | ORAL_TABLET | Freq: Two times a day (BID) | ORAL | Status: DC

## 2014-01-02 NOTE — Telephone Encounter (Signed)
Requested Prescriptions   Signed Prescriptions Disp Refills  . carvedilol (COREG) 6.25 MG tablet 60 tablet 3    Sig: Take 0.5 tablets (3.125 mg total) by mouth 2 (two) times daily with a meal.    Authorizing Provider: Kathlyn Sacramento A    Ordering User: Britt Bottom

## 2014-02-03 ENCOUNTER — Other Ambulatory Visit: Payer: Self-pay

## 2014-02-03 MED ORDER — RAMIPRIL 5 MG PO CAPS: 5.0000 mg | ORAL_CAPSULE | Freq: Every day | ORAL | Status: DC

## 2014-02-17 ENCOUNTER — Encounter: Payer: Self-pay | Admitting: Internal Medicine

## 2014-02-17 ENCOUNTER — Ambulatory Visit (INDEPENDENT_AMBULATORY_CARE_PROVIDER_SITE_OTHER): Payer: Medicare Other | Admitting: Internal Medicine

## 2014-02-17 VITALS — BP 120/82 | HR 76 | Ht 70.0 in | Wt 217.0 lb

## 2014-02-17 DIAGNOSIS — R002 Palpitations: Secondary | ICD-10-CM

## 2014-02-17 LAB — MDC_IDC_ENUM_SESS_TYPE_INCLINIC
Brady Statistic RV Percent Paced: 0.58 %
Date Time Interrogation Session: 20150324110104
HighPow Impedance: 87.75 Ohm
Lead Channel Impedance Value: 437.5 Ohm
Lead Channel Setting Pacing Pulse Width: 0.5 ms
Lead Channel Setting Sensing Sensitivity: 0.5 mV
MDC IDC MSMT BATTERY REMAINING LONGEVITY: 87.6 mo
MDC IDC MSMT LEADCHNL RV PACING THRESHOLD AMPLITUDE: 1 V
MDC IDC MSMT LEADCHNL RV PACING THRESHOLD PULSEWIDTH: 0.5 ms
MDC IDC MSMT LEADCHNL RV SENSING INTR AMPL: 11.7 mV
MDC IDC PG SERIAL: 7026078
MDC IDC SET LEADCHNL RV PACING AMPLITUDE: 2.5 V
Zone Setting Detection Interval: 250 ms
Zone Setting Detection Interval: 300 ms

## 2014-02-17 NOTE — Progress Notes (Signed)
skf      Patient Care Team: [name], MD as PCP - General (Family Medicine)   HPI  Andres [name] is a 73 y.o. male Seen in followup for ICD implanted for ischemic/nonischemic cardiomyopathy  and inducible ventricular tachycardia -April 2013 catheterization April 2013 demonstrating patent single vein graft to the OM.bypass was done emergently in 1994  EF was 25%  He has been in atrial fibrillation since April 2013. He has infrequent days are much worse than others. He is largely following days a significant exertion    The patient denies chest pain, shortness of breath, nocturnal dyspnea, orthopnea or peripheral edema. There have been no palpitations, lightheadedness or syncope.  1   Past Medical History  Diagnosis Date  . Sleep apnea     On CPAP  . Ischemic cardiomyopathy     Status post CABG in 1994; echo/Myoview 2011-12 ejection fraction 30-35%  . Ventricular tachycardia -inducible at EP testing     ppalpitations  . 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 Chronic systolic congestive heart failure   . ICD (implantable cardiac defibrillator) in place   . Type II diabetes mellitus   . Nephrolithiasis 1970's  . Coronary artery disease   . Hyperlipidemia   . Atrial fibrillation, persistent-long-term     Past Surgical History  Procedure Laterality Date  . Coronary artery bypass graft  1994    [facility] ; CABG X1  . Cataract extraction w/ intraocular lens  implant, bilateral  2011  . Total knee arthroplasty  2011    left  . Joint replacement    . Knee arthroscopy  2012    right  . Insert / replace / remove pacemaker  02/28/12    "w/defibrillator"  . Cardioversion  03/01/2012    Procedure: CARDIOVERSION;  Surgeon: Deboraha Sprang, MD;  Location: [facility] OR;  Service: Cardiovascular;  Laterality: N/A;  . Cardiac catheterization  02/28/2012    Minor disease in LAD and RCA, LCX: 70% mid and occluded distally. Patent SVG to OM3  . Basel cell removal      left arm     Current Outpatient Prescriptions  Medication Sig Dispense Refill  . carvedilol (COREG) 6.25 MG tablet Take 0.5 tablets (3.125 mg total) by mouth 2 (two) times daily with a meal.  60 tablet  3  . Cinnamon 500 MG TABS Take 1,000 mg by mouth 2 (two) times daily.       . Coenzyme Q10 (CO Q 10 PO) Take 300 mg by mouth daily.      . fluticasone (FLONASE) 50 MCG/ACT nasal spray 1 spray as needed.       . folic acid (FOLVITE) 1 MG tablet Take 1 mg by mouth daily.      . furosemide (LASIX) 40 MG tablet Take 0.5 tablets (20 mg total) by mouth daily.  30 tablet  6  . GARLIC PO Take 1 capsule by mouth 2 (two) times daily.       . Glucosamine-Chondroit-Vit C-Mn (GLUCOSAMINE CHONDR 1500 COMPLX PO) Take 1,500 mg by mouth daily.       . insulin regular human CONCENTRATED (HUMULIN R) 500 UNIT/ML SOLN injection 8 Units 3 (three) times daily.       . metFORMIN (GLUCOPHAGE) 1000 MG tablet Take 1,000 mg by mouth daily with breakfast.      . Multiple Vitamin (MULTIVITAMIN) capsule Take 1 capsule by mouth daily.      . Omega-3 Fatty Acids (FISH OIL)  1000 MG CAPS Take 1,000 mg by mouth 2 (two) times daily.      . ramipril (ALTACE) 5 MG capsule Take 1 capsule (5 mg total) by mouth daily.  30 capsule  5  . Rivaroxaban (XARELTO) 20 MG TABS tablet Take 1 tablet (20 mg total) by mouth daily.  30 tablet  6  . rosuvastatin (CRESTOR) 10 MG tablet Take 10 mg by mouth daily.      [name] ZETIA 10 MG tablet Take 1 tablet by mouth daily.       No current facility-administered medications for this visit.    No Known Allergies  Review of Systems negative except from HPI and PMH  Physical Exam BP 120/82  Pulse 76  Ht 5\' 10"  (1.778 m)  Wt 217 lb (98.431 kg)  BMI 31.14 kg/m2 Well developed and nourished in no acute distress HENT normal Neck supple with JVP-flat Clear Device pocket well healed; without hematoma or erythema.  There is no tethering  Regular rate and rhythm, no murmurs or gallops Abd-soft with active  BS No Clubbing cyanosis edema Skin-warm and dry A & Oriented  Grossly normal sensory and motor function  Atrial fibrillation at 76 Intervals-/10/42 Essentially unchanged from 2014  Assessment and  Plan  Congestive heart failure-chronic-systolic  Cardiomyopathy ischemic/nonischemic  Ventricular tachycardia-inducible  ICD single chamber-[employer] The patient's device was interrogated.  The information was reviewed. No changes were made in the programming.    The patient is doing well. I suspect that the fatigue is related to his heart failure.  It is bothersome to me that his beta blockers have been discontinued. He should be on both beta blockers and Aldactone. I asked that he discuss the former with his PCP with we will be meeting in the next few weeks as it was he can stop it. I've asked that he discuss Aldactone when he follows up with Dr. N/A N/A.

## 2014-02-17 NOTE — Patient Instructions (Signed)
Remote monitoring is used to monitor your Pacemaker of ICD from home. This monitoring reduces the number of office visits required to check your device to one time per year. It allows [place] to keep an eye on the functioning of your device to ensure it is working properly. You are scheduled for a device check from home on 05/21/14. You may send your transmission at any time that day. If you have a wireless device, the transmission will be sent automatically. After your physician reviews your transmission, you will receive a postcard with your next transmission date.   Your physician wants you to follow-up in: 12 months. You will receive a reminder letter in the mail two months in advance. If you don't receive a letter, please call our office to schedule the follow-up appointment.

## 2014-02-27 DIAGNOSIS — M1990 Unspecified osteoarthritis, unspecified site: Secondary | ICD-10-CM | POA: Insufficient documentation

## 2014-02-27 DIAGNOSIS — M353 Polymyalgia rheumatica: Secondary | ICD-10-CM | POA: Insufficient documentation

## 2014-03-05 ENCOUNTER — Ambulatory Visit: Payer: Medicare Other | Admitting: Cardiovascular Disease

## 2014-03-27 ENCOUNTER — Encounter: Payer: Self-pay | Admitting: Cardiovascular Disease

## 2014-03-27 ENCOUNTER — Ambulatory Visit (INDEPENDENT_AMBULATORY_CARE_PROVIDER_SITE_OTHER): Payer: Medicare Other | Admitting: Cardiovascular Disease

## 2014-03-27 VITALS — BP 120/70 | HR 75 | Ht 70.0 in | Wt 221.2 lb

## 2014-03-27 DIAGNOSIS — I2510 Atherosclerotic heart disease of native coronary artery without angina pectoris: Secondary | ICD-10-CM

## 2014-03-27 DIAGNOSIS — E785 Hyperlipidemia, unspecified: Secondary | ICD-10-CM

## 2014-03-27 DIAGNOSIS — I509 Heart failure, unspecified: Secondary | ICD-10-CM

## 2014-03-27 DIAGNOSIS — I4891 Unspecified atrial fibrillation: Secondary | ICD-10-CM

## 2014-03-27 DIAGNOSIS — I4819 Other persistent atrial fibrillation: Secondary | ICD-10-CM

## 2014-03-27 DIAGNOSIS — I5022 Chronic systolic (congestive) heart failure: Secondary | ICD-10-CM

## 2014-03-27 MED ORDER — CARVEDILOL 3.125 MG PO TABS: 3.1250 mg | ORAL_TABLET | Freq: Two times a day (BID) | ORAL | Status: DC

## 2014-03-27 MED ORDER — FUROSEMIDE 20 MG PO TABS
20.0000 mg | ORAL_TABLET | Freq: Every day | ORAL | Status: DC
Start: 2014-03-27 — End: 2015-03-01

## 2014-03-27 NOTE — Assessment & Plan Note (Signed)
He has not tolerated a higher dose statin.

## 2014-03-27 NOTE — Assessment & Plan Note (Signed)
He is doing very well at this time and can't be in [place] class II. He appears to be euvolemic and he has been using only 20 mg once daily of Lasix. There is a strong indication for being on a beta blocker due to reduced ejection fraction. Thus, I presume carvedilol 3.125 mg twice daily. Continue treatment with Ramipril.  He has not been on spironolactone due to chronic kidney disease with hyperkalemia in the past. Also he had issues with no blood pressure. This can be considered if renal function allows and LV systolic function is still reduced. His EF has not been checked since 2013.

## 2014-03-27 NOTE — Patient Instructions (Signed)
Your physician recommends that you have lab work today: CBC  BMP  Your physician has recommended you make the following change in your medication:  Decrease Coreg to 3.125 mg twice daily  Decrease lasix to 20 mg daily   Your physician wants you to follow-up in: 6 months. You will receive a reminder letter in the mail two months in advance. If you don't receive a letter, please call our office to schedule the follow-up appointment.

## 2014-03-27 NOTE — Assessment & Plan Note (Signed)
Continue rate control and long-term anticoagulation. I will check CBC and basic metabolic profile today given that he is on Xarelto.

## 2014-03-27 NOTE — Assessment & Plan Note (Signed)
He has no symptoms of angina. Continue medical therapy. 

## 2014-03-27 NOTE — Progress Notes (Signed)
Electrophysiologist: Dr. Caryl Comes.  HPI  This is a 73 year old male who is here today for a followup . He has known history of coronary artery disease with nonischemic cardiomyopathy and chronic atrial fibrillation.  He had cardiac catheterization done in April of 2013 which showed patent SVG to OM with severely reduced LV systolic function(EF of 40-98%).  He had an EP study done by Dr. Caryl Comes which showed induced ventricular tachycardia. He had an ICD placement done.  He has been doing well since last visit. Carvedilol was discontinued by primary care physician likely due to hypotension. He did not tolerate higher dose of rosuvastatin due to myalgia. He denies chest pain or significant dyspnea.  No Known Allergies   Current Outpatient Prescriptions on File Prior to Visit  Medication Sig Dispense Refill  . Cinnamon 500 MG TABS Take 1,000 mg by mouth 2 (two) times daily.       . Coenzyme Q10 (CO Q 10 PO) Take 300 mg by mouth daily.      . fluticasone (FLONASE) 50 MCG/ACT nasal spray 1 spray as needed.       . folic acid (FOLVITE) 1 MG tablet Take 1 mg by mouth daily.      [name] GARLIC PO Take 1 capsule by mouth 2 (two) times daily.       . Glucosamine-Chondroit-Vit C-Mn (GLUCOSAMINE CHONDR 1500 COMPLX PO) Take 1,500 mg by mouth daily.       . insulin regular human CONCENTRATED (HUMULIN R) 500 UNIT/ML SOLN injection 8 Units 3 (three) times daily.       . metFORMIN (GLUCOPHAGE) 1000 MG tablet Take 1,000 mg by mouth daily with breakfast.      . Multiple Vitamin (MULTIVITAMIN) capsule Take 1 capsule by mouth daily.      . Omega-3 Fatty Acids (FISH OIL) 1000 MG CAPS Take 1,000 mg by mouth 2 (two) times daily.      . ramipril (ALTACE) 5 MG capsule Take 1 capsule (5 mg total) by mouth daily.  30 capsule  5  . Rivaroxaban (XARELTO) 20 MG TABS tablet Take 1 tablet (20 mg total) by mouth daily.  30 tablet  6  . rosuvastatin (CRESTOR) 10 MG tablet Take 10 mg by mouth daily.      [name] ZETIA 10 MG tablet  Take 1 tablet by mouth daily.       No current facility-administered medications on file prior to visit.     Past Medical History  Diagnosis Date  . Sleep apnea     On CPAP  . Ischemic cardiomyopathy     Status post CABG in 1994; echo/Myoview 2011-12 ejection fraction 30-35%  . Ventricular tachycardia -inducible at EP testing     ppalpitations  . 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 Chronic systolic congestive heart failure   . ICD (implantable cardiac defibrillator) in place   . Type II diabetes mellitus   . Nephrolithiasis 1970's  . Coronary artery disease   . Hyperlipidemia   . Atrial fibrillation, persistent-long-term      Past Surgical History  Procedure Laterality Date  . Coronary artery bypass graft  1994    [facility] ; CABG X1  . Cataract extraction w/ intraocular lens  implant, bilateral  2011  . Total knee arthroplasty  2011    left  . Joint replacement    . Knee arthroscopy  2012    right  . Insert / replace / remove pacemaker  02/28/12    "  w/defibrillator"  . Cardioversion  03/01/2012    Procedure: CARDIOVERSION;  Surgeon: Deboraha Sprang, MD;  Location: [facility] OR;  Service: Cardiovascular;  Laterality: N/A;  . Cardiac catheterization  02/28/2012    Minor disease in LAD and RCA, LCX: 70% mid and occluded distally. Patent SVG to OM3  . Basel cell removal      left arm     Family History  Problem Relation Age of Onset  . Heart attack Father      History   Social History  . Marital Status: Married    Spouse Name: N/A    Number of Children: 3  . Years of Education: N/A   Occupational History  . Not on file.   Social History Main Topics  . Smoking status: Former Smoker -- 1.00 packs/day for 25 years    Types: Cigarettes    Quit date: 01/09/1988  . Smokeless tobacco: Never Used  . Alcohol Use: No  . Drug Use: No  . Sexual Activity: No   Other Topics Concern  . Not on file   Social History Narrative  . No narrative on file     PHYSICAL  EXAM   BP 120/70  Pulse 75  Ht 5\' 10"  (1.778 m)  Wt 221 lb 4 oz (100.358 kg)  BMI 31.75 kg/m2  Constitutional: He is oriented to person, place, and time. He appears well-developed and well-nourished. No distress.  HENT: No nasal discharge.  Head: Normocephalic and atraumatic.  Eyes: Pupils are equal and round. Right eye exhibits no discharge. Left eye exhibits no discharge.  Neck: Normal range of motion. Neck supple. No JVD present. No thyromegaly present.  Cardiovascular: Normal rate, irregular rhythm, normal heart sounds and. Exam reveals no gallop and no friction rub. No murmur heard.  Pulmonary/Chest: Effort normal. No stridor. No respiratory distress. He has no wheezes. He has bibasilar crackles more at the right base.  Abdominal: Soft. Bowel sounds are normal. He exhibits no distension. There is no tenderness. There is no rebound and no guarding.  Musculoskeletal: Normal range of motion. He exhibits no edema and no tenderness.  Neurological: He is alert and oriented to person, place, and time. Coordination normal.  Skin: Skin is warm and dry. No rash noted. He is not diaphoretic. No erythema. No pallor.  Psychiatric: He has a normal mood and affect. His behavior is normal. Judgment and thought content normal.   ATF:TDDUKG flutter-fibrillation  Low voltage in precordial leads.   -Nonspecific QRS widening.   -  Nonspecific T-abnormality.   ABNORMAL     ASSESSMENT AND PLAN

## 2014-03-28 LAB — CBC WITH DIFFERENTIAL
Basophils Absolute: 0 10*3/uL (ref 0.0–0.2)
Basos: 0 %
EOS ABS: 0.1 10*3/uL (ref 0.0–0.4)
EOS: 2 %
HCT: 45.1 % (ref 37.5–51.0)
Hemoglobin: 15.1 g/dL (ref 12.6–17.7)
IMMATURE GRANULOCYTES: 0 %
Immature Grans (Abs): 0 10*3/uL (ref 0.0–0.1)
LYMPHS ABS: 2 10*3/uL (ref 0.7–3.1)
Lymphs: 39 %
MCH: 34.6 pg — AB (ref 26.6–33.0)
MCHC: 33.5 g/dL (ref 31.5–35.7)
MCV: 103 fL — AB (ref 79–97)
MONOS ABS: 0.6 10*3/uL (ref 0.1–0.9)
Monocytes: 11 %
NEUTROS PCT: 48 %
Neutrophils Absolute: 2.5 10*3/uL (ref 1.4–7.0)
Platelets: 223 10*3/uL (ref 150–379)
RBC: 4.37 x10E6/uL (ref 4.14–5.80)
RDW: 15.4 % (ref 12.3–15.4)
WBC: 5.2 10*3/uL (ref 3.4–10.8)

## 2014-03-28 LAB — BASIC METABOLIC PANEL
BUN/Creatinine Ratio: 13 (ref 10–22)
BUN: 18 mg/dL (ref 8–27)
CALCIUM: 8.7 mg/dL (ref 8.6–10.2)
CO2: 22 mmol/L (ref 18–29)
CREATININE: 1.41 mg/dL — AB (ref 0.76–1.27)
Chloride: 100 mmol/L (ref 97–108)
GFR calc Af Amer: 57 mL/min/{1.73_m2} — ABNORMAL LOW (ref >59)
GFR calc non Af Amer: 49 mL/min/{1.73_m2} — ABNORMAL LOW (ref >59)
Glucose: 324 mg/dL — ABNORMAL HIGH (ref 65–99)
POTASSIUM: 4.9 mmol/L (ref 3.5–5.2)
SODIUM: 136 mmol/L (ref 134–144)

## 2014-04-22 DIAGNOSIS — I10 Essential (primary) hypertension: Secondary | ICD-10-CM | POA: Insufficient documentation

## 2014-05-21 ENCOUNTER — Encounter: Payer: Medicare Other | Admitting: *Deleted

## 2014-05-21 ENCOUNTER — Telehealth: Payer: Self-pay | Admitting: Cardiology

## 2014-05-21 NOTE — Telephone Encounter (Signed)
Spoke with pt and reminded pt of remote transmission that is due today. Pt verbalized understanding.   

## 2014-05-22 ENCOUNTER — Encounter: Payer: Self-pay | Admitting: Cardiology

## 2014-05-27 NOTE — Telephone Encounter (Signed)
This encounter was created in error - please disregard.

## 2014-06-02 ENCOUNTER — Ambulatory Visit (INDEPENDENT_AMBULATORY_CARE_PROVIDER_SITE_OTHER): Payer: Medicare Other | Admitting: *Deleted

## 2014-06-02 ENCOUNTER — Encounter: Payer: Self-pay | Admitting: Internal Medicine

## 2014-06-02 DIAGNOSIS — I5022 Chronic systolic (congestive) heart failure: Secondary | ICD-10-CM

## 2014-06-02 DIAGNOSIS — I509 Heart failure, unspecified: Secondary | ICD-10-CM

## 2014-06-02 DIAGNOSIS — I255 Ischemic cardiomyopathy: Secondary | ICD-10-CM

## 2014-06-02 DIAGNOSIS — I2589 Other forms of chronic ischemic heart disease: Secondary | ICD-10-CM

## 2014-06-03 NOTE — Progress Notes (Signed)
Remote ICD transmission.   

## 2014-06-05 LAB — MDC_IDC_ENUM_SESS_TYPE_REMOTE
Battery Remaining Longevity: 85 mo
Battery Remaining Percentage: 78 %
Battery Voltage: 2.99 V
Brady Statistic RV Percent Paced: 1.3 %
Date Time Interrogation Session: 20150707203000
HIGH POWER IMPEDANCE MEASURED VALUE: 83 Ohm
HighPow Impedance: 83 Ohm
Implantable Pulse Generator Serial Number: 7026078
Lead Channel Impedance Value: 460 Ohm
Lead Channel Pacing Threshold Pulse Width: 0.5 ms
Lead Channel Sensing Intrinsic Amplitude: 11.7 mV
Lead Channel Setting Pacing Amplitude: 2.5 V
Lead Channel Setting Sensing Sensitivity: 0.5 mV
MDC IDC MSMT LEADCHNL RV PACING THRESHOLD AMPLITUDE: 1 V
MDC IDC SET LEADCHNL RV PACING PULSEWIDTH: 0.5 ms
Zone Setting Detection Interval: 250 ms
Zone Setting Detection Interval: 300 ms

## 2014-07-01 ENCOUNTER — Encounter: Payer: Self-pay | Admitting: Cardiology

## 2014-07-02 DIAGNOSIS — I4729 Other ventricular tachycardia: Secondary | ICD-10-CM | POA: Insufficient documentation

## 2014-07-02 DIAGNOSIS — R809 Proteinuria, unspecified: Secondary | ICD-10-CM | POA: Insufficient documentation

## 2014-07-02 DIAGNOSIS — Z794 Long term (current) use of insulin: Secondary | ICD-10-CM | POA: Insufficient documentation

## 2014-07-02 DIAGNOSIS — I2589 Other forms of chronic ischemic heart disease: Secondary | ICD-10-CM | POA: Insufficient documentation

## 2014-07-02 DIAGNOSIS — I472 Ventricular tachycardia, unspecified: Secondary | ICD-10-CM | POA: Insufficient documentation

## 2014-07-02 DIAGNOSIS — I2510 Atherosclerotic heart disease of native coronary artery without angina pectoris: Secondary | ICD-10-CM | POA: Insufficient documentation

## 2014-07-31 ENCOUNTER — Other Ambulatory Visit: Payer: Self-pay

## 2014-07-31 MED ORDER — RAMIPRIL 5 MG PO CAPS: 5.0000 mg | ORAL_CAPSULE | Freq: Every day | ORAL | Status: DC

## 2014-07-31 NOTE — Telephone Encounter (Signed)
Refill sent for ramipril  

## 2014-08-07 ENCOUNTER — Other Ambulatory Visit: Payer: Self-pay

## 2014-08-07 MED ORDER — RIVAROXABAN 20 MG PO TABS: 20.0000 mg | ORAL_TABLET | Freq: Every day | ORAL | Status: DC

## 2014-08-07 NOTE — Telephone Encounter (Signed)
Refill sent for xarelto  

## 2014-08-20 DIAGNOSIS — E88819 Insulin resistance, unspecified: Secondary | ICD-10-CM | POA: Insufficient documentation

## 2014-09-03 ENCOUNTER — Encounter: Payer: Self-pay | Admitting: Internal Medicine

## 2014-09-03 ENCOUNTER — Ambulatory Visit (INDEPENDENT_AMBULATORY_CARE_PROVIDER_SITE_OTHER): Payer: Medicare Other | Admitting: *Deleted

## 2014-09-03 DIAGNOSIS — I255 Ischemic cardiomyopathy: Secondary | ICD-10-CM

## 2014-09-03 DIAGNOSIS — I5022 Chronic systolic (congestive) heart failure: Secondary | ICD-10-CM

## 2014-09-03 LAB — MDC_IDC_ENUM_SESS_TYPE_REMOTE
Battery Remaining Longevity: 83 mo
Battery Voltage: 2.98 V
Brady Statistic RV Percent Paced: 1.2 %
HighPow Impedance: 81 Ohm
HighPow Impedance: 81 Ohm
Implantable Pulse Generator Serial Number: 7026078
Lead Channel Impedance Value: 460 Ohm
Lead Channel Pacing Threshold Amplitude: 1 V
Lead Channel Pacing Threshold Pulse Width: 0.5 ms
Lead Channel Sensing Intrinsic Amplitude: 11.7 mV
Lead Channel Setting Pacing Amplitude: 2.5 V
Lead Channel Setting Pacing Pulse Width: 0.5 ms
Lead Channel Setting Sensing Sensitivity: 0.5 mV
MDC IDC MSMT BATTERY REMAINING PERCENTAGE: 76 %
MDC IDC SESS DTM: 20151008060018
MDC IDC SET ZONE DETECTION INTERVAL: 250 ms
MDC IDC SET ZONE DETECTION INTERVAL: 300 ms

## 2014-09-03 NOTE — Progress Notes (Signed)
Remote ICD transmission.   

## 2014-09-09 ENCOUNTER — Encounter: Payer: Self-pay | Admitting: Cardiology

## 2014-09-12 ENCOUNTER — Emergency Department: Payer: Self-pay | Admitting: Emergency Medicine

## 2014-09-24 ENCOUNTER — Ambulatory Visit (INDEPENDENT_AMBULATORY_CARE_PROVIDER_SITE_OTHER): Payer: Medicare Other | Admitting: Cardiovascular Disease

## 2014-09-24 ENCOUNTER — Encounter: Payer: Self-pay | Admitting: Cardiovascular Disease

## 2014-09-24 VITALS — BP 123/78 | HR 73 | Ht 70.0 in | Wt 222.2 lb

## 2014-09-24 DIAGNOSIS — E785 Hyperlipidemia, unspecified: Secondary | ICD-10-CM

## 2014-09-24 DIAGNOSIS — I481 Persistent atrial fibrillation: Secondary | ICD-10-CM

## 2014-09-24 DIAGNOSIS — I5022 Chronic systolic (congestive) heart failure: Secondary | ICD-10-CM

## 2014-09-24 DIAGNOSIS — I701 Atherosclerosis of renal artery: Secondary | ICD-10-CM

## 2014-09-24 DIAGNOSIS — I2510 Atherosclerotic heart disease of native coronary artery without angina pectoris: Secondary | ICD-10-CM

## 2014-09-24 DIAGNOSIS — I4819 Other persistent atrial fibrillation: Secondary | ICD-10-CM

## 2014-09-24 NOTE — Assessment & Plan Note (Signed)
Continue treatment with rosuvastatin. He is not able to tolerate higher doses due to myalgia.   

## 2014-09-24 NOTE — Assessment & Plan Note (Signed)
He is doing well with rate control. Continue anticoagulation with Xarelto.

## 2014-09-24 NOTE — Progress Notes (Signed)
Electrophysiologist: Dr. Caryl Comes.  HPI  This is a 73 year old male who is here today for a followup . He has known history of coronary artery disease with nonischemic cardiomyopathy and chronic atrial fibrillation.  He had cardiac catheterization done in April of 2013 which showed patent SVG to OM with severely reduced LV systolic function(EF of 42-68%).  He had an EP study done by Dr. Caryl Comes which showed induced ventricular tachycardia. He had an ICD placement done. He has chronic atrial fibrillation treated with rate control. He has been doing well since last visit. He did not tolerate higher dose of rosuvastatin due to myalgia. He denies chest pain or significant dyspnea. Overall, he has been doing very well.  No Known Allergies   Current Outpatient Prescriptions on File Prior to Visit  Medication Sig Dispense Refill  . carvedilol (COREG) 3.125 MG tablet Take 1 tablet (3.125 mg total) by mouth 2 (two) times daily.  180 tablet  3  . Cinnamon 500 MG TABS Take 1,000 mg by mouth 2 (two) times daily.       . Coenzyme Q10 (CO Q 10 PO) Take 300 mg by mouth daily.      . furosemide (LASIX) 20 MG tablet Take 1 tablet (20 mg total) by mouth daily.  90 tablet  3  . GARLIC PO Take 1 capsule by mouth daily.       . Glucosamine-Chondroit-Vit C-Mn (GLUCOSAMINE CHONDR 1500 COMPLX PO) Take 1,500 mg by mouth 2 (two) times daily.       . Multiple Vitamin (MULTIVITAMIN) capsule Take 1 capsule by mouth daily.      . ramipril (ALTACE) 5 MG capsule Take 1 capsule (5 mg total) by mouth daily.  30 capsule  6  . rivaroxaban (XARELTO) 20 MG TABS tablet Take 1 tablet (20 mg total) by mouth daily.  30 tablet  6  . rosuvastatin (CRESTOR) 10 MG tablet Take 10 mg by mouth daily.       No current facility-administered medications on file prior to visit.     Past Medical History  Diagnosis Date  . Sleep apnea     On CPAP  . Ischemic cardiomyopathy     Status post CABG in 1994; echo/Myoview 2011-12 ejection  fraction 30-35%  . Ventricular tachycardia -inducible at EP testing     ppalpitations  . 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 Chronic systolic congestive heart failure   . ICD (implantable cardiac defibrillator) in place   . Type II diabetes mellitus   . Nephrolithiasis 1970's  . Coronary artery disease   . Hyperlipidemia   . Atrial fibrillation, persistent-long-term      Past Surgical History  Procedure Laterality Date  . Coronary artery bypass graft  1994    [facility] ; CABG X1  . Cataract extraction w/ intraocular lens  implant, bilateral  2011  . Total knee arthroplasty  2011    left  . Joint replacement    . Knee arthroscopy  2012    right  . Insert / replace / remove pacemaker  02/28/12    "w/defibrillator"  . Cardioversion  03/01/2012    Procedure: CARDIOVERSION;  Surgeon: Deboraha Sprang, MD;  Location: [facility] OR;  Service: Cardiovascular;  Laterality: N/A;  . Cardiac catheterization  02/28/2012    Minor disease in LAD and RCA, LCX: 70% mid and occluded distally. Patent SVG to OM3  . Basel cell removal      left arm  Family History  Problem Relation Age of Onset  . Heart attack Father      History   Social History  . Marital Status: Married    Spouse Name: N/A    Number of Children: 3  . Years of Education: N/A   Occupational History  . Not on file.   Social History Main Topics  . Smoking status: Former Smoker -- 1.00 packs/day for 25 years    Types: Cigarettes    Quit date: 01/09/1988  . Smokeless tobacco: Never Used  . Alcohol Use: No  . Drug Use: No  . Sexual Activity: No   Other Topics Concern  . Not on file   Social History Narrative  . No narrative on file     PHYSICAL EXAM   BP 123/78  Pulse 73  Ht 5\' 10"  (1.778 m)  Wt 222 lb 4 oz (100.812 kg)  BMI 31.89 kg/m2  Constitutional: He is oriented to person, place, and time. He appears well-developed and well-nourished. No distress.  HENT: No nasal discharge.  Head: Normocephalic and  atraumatic.  Eyes: Pupils are equal and round. Right eye exhibits no discharge. Left eye exhibits no discharge.  Neck: Normal range of motion. Neck supple. No JVD present. No thyromegaly present.  Cardiovascular: Normal rate, irregular rhythm, normal heart sounds and. Exam reveals no gallop and no friction rub. No murmur heard.  Pulmonary/Chest: Effort normal. No stridor. No respiratory distress. He has no wheezes. He has bibasilar crackles more at the right base.  Abdominal: Soft. Bowel sounds are normal. He exhibits no distension. There is no tenderness. There is no rebound and no guarding.  Musculoskeletal: Normal range of motion. He exhibits no edema and no tenderness.  Neurological: He is alert and oriented to person, place, and time. Coordination normal.  Skin: Skin is warm and dry. No rash noted. He is not diaphoretic. No erythema. No pallor.  Psychiatric: He has a normal mood and affect. His behavior is normal. Judgment and thought content normal.   DBZ:MCEYEM flutter-fibrillation  Low voltage in precordial leads.   -Nonspecific QRS widening.   -Poor R-wave progression -may be secondary to pulmonary disease   consider old anterior infarct.   ABNORMAL       ASSESSMENT AND PLAN

## 2014-09-24 NOTE — Assessment & Plan Note (Signed)
He appears to be euvolemic on current medications. Currently New [place] Heart Association class II.

## 2014-09-24 NOTE — Assessment & Plan Note (Signed)
He has no symptoms of angina. Continue medical therapy. 

## 2014-09-24 NOTE — Patient Instructions (Signed)
Continue same medications.   Your physician wants you to follow-up in: 6 months.  You will receive a reminder letter in the mail two months in advance. If you don't receive a letter, please call our office to schedule the follow-up appointment.  Your next appointment will be scheduled in our new office located at :  [facility]  [address], [address]  [place], [place] [address]  

## 2014-10-01 DIAGNOSIS — E1121 Type 2 diabetes mellitus with diabetic nephropathy: Secondary | ICD-10-CM | POA: Insufficient documentation

## 2014-10-05 DIAGNOSIS — IMO0002 Reserved for concepts with insufficient information to code with codable children: Secondary | ICD-10-CM | POA: Insufficient documentation

## 2014-11-05 ENCOUNTER — Encounter (HOSPITAL_COMMUNITY): Payer: Self-pay | Admitting: Internal Medicine

## 2014-12-07 ENCOUNTER — Encounter: Payer: Medicare Other | Admitting: *Deleted

## 2014-12-31 ENCOUNTER — Telehealth: Payer: Self-pay

## 2014-12-31 NOTE — Telephone Encounter (Signed)
Pt states his BP is low, today, 9:30am 103/61, 1:00pm 105/67

## 2014-12-31 NOTE — Telephone Encounter (Signed)
Pt states his BP is low, today, 9:30am 103/61, 1:00pm 105/67 HR in the 60's Patient says he just does not feel well "he feels like he just cant do anything His average blood pressure is 120/70 No cardiac meds have been changed since his last visit  The only med change has been his night dose of insulin  His blood sugar is currently 150 He has been staying well hydrated and no changes have been made to his diet

## 2015-01-01 NOTE — Telephone Encounter (Signed)
LVM  2/5

## 2015-01-01 NOTE — Telephone Encounter (Signed)
These readings are still in normal range. Continue to monitor.

## 2015-01-15 NOTE — Telephone Encounter (Signed)
LVM 2/19

## 2015-02-23 ENCOUNTER — Encounter: Payer: Medicare Other | Admitting: Internal Medicine

## 2015-03-01 ENCOUNTER — Other Ambulatory Visit: Payer: Self-pay | Admitting: *Deleted

## 2015-03-01 MED ORDER — FUROSEMIDE 20 MG PO TABS: 20.0000 mg | ORAL_TABLET | Freq: Every day | ORAL | Status: DC

## 2015-03-02 ENCOUNTER — Encounter: Payer: Self-pay | Admitting: Internal Medicine

## 2015-03-02 ENCOUNTER — Telehealth: Payer: Self-pay | Admitting: *Deleted

## 2015-03-02 ENCOUNTER — Ambulatory Visit (INDEPENDENT_AMBULATORY_CARE_PROVIDER_SITE_OTHER): Payer: Medicare Other | Admitting: Internal Medicine

## 2015-03-02 ENCOUNTER — Other Ambulatory Visit: Payer: Self-pay

## 2015-03-02 VITALS — BP 110/80 | HR 95 | Ht 70.0 in | Wt 230.5 lb

## 2015-03-02 DIAGNOSIS — I5022 Chronic systolic (congestive) heart failure: Secondary | ICD-10-CM | POA: Diagnosis not present

## 2015-03-02 DIAGNOSIS — Z95810 Presence of automatic (implantable) cardiac defibrillator: Secondary | ICD-10-CM

## 2015-03-02 DIAGNOSIS — I481 Persistent atrial fibrillation: Secondary | ICD-10-CM

## 2015-03-02 DIAGNOSIS — I4819 Other persistent atrial fibrillation: Secondary | ICD-10-CM

## 2015-03-02 DIAGNOSIS — I255 Ischemic cardiomyopathy: Secondary | ICD-10-CM

## 2015-03-02 LAB — MDC_IDC_ENUM_SESS_TYPE_INCLINIC
Brady Statistic RV Percent Paced: 1.5 %
Date Time Interrogation Session: 20160405100711
HighPow Impedance: 80 Ohm
Implantable Pulse Generator Serial Number: 7026078
Lead Channel Impedance Value: 450 Ohm
Lead Channel Pacing Threshold Amplitude: 1 V
Lead Channel Pacing Threshold Pulse Width: 0.5 ms
Lead Channel Pacing Threshold Pulse Width: 0.5 ms
Lead Channel Setting Pacing Amplitude: 2.5 V
MDC IDC MSMT BATTERY REMAINING LONGEVITY: 80.4 mo
MDC IDC MSMT LEADCHNL RV PACING THRESHOLD AMPLITUDE: 1 V
MDC IDC MSMT LEADCHNL RV SENSING INTR AMPL: 11.7 mV
MDC IDC SET LEADCHNL RV PACING PULSEWIDTH: 0.5 ms
MDC IDC SET LEADCHNL RV SENSING SENSITIVITY: 0.5 mV
MDC IDC SET ZONE DETECTION INTERVAL: 250 ms
Zone Setting Detection Interval: 300 ms

## 2015-03-02 MED ORDER — RAMIPRIL 5 MG PO CAPS: 5.0000 mg | ORAL_CAPSULE | Freq: Every day | ORAL | Status: DC

## 2015-03-02 MED ORDER — FUROSEMIDE 20 MG PO TABS
20.0000 mg | ORAL_TABLET | Freq: Every day | ORAL | Status: DC
Start: 2015-03-02 — End: 2015-04-09

## 2015-03-02 NOTE — Patient Instructions (Addendum)
Remote monitoring is used to monitor your Pacemaker of ICD from home. This monitoring reduces the number of office visits required to check your device to one time per year. It allows [place] to keep an eye on the functioning of your device to ensure it is working properly. You are scheduled for a device check from home on July 5h. You may send your transmission at any time that day. If you have a wireless device, the transmission will be sent automatically. After your physician reviews your transmission, you will receive a postcard with your next transmission date.  Your physician wants you to follow-up in: 1 year with Dr. Caryl Comes. You will receive a reminder letter in the mail two months in advance. If you don't receive a letter, please call our office to schedule the follow-up appointment. The number is listed at the top of this page.  [place]  Your caregiver has ordered a Stress Test with nuclear imaging. The purpose of this test is to evaluate the blood supply to your heart muscle. This procedure is referred to as a "Non-Invasive Stress Test." This is because other than having an IV started in your vein, nothing is inserted or "invades" your body. Cardiac stress tests are done to find areas of poor blood flow to the heart by determining the extent of coronary artery disease (CAD). Some patients exercise on a treadmill, which naturally increases the blood flow to your heart, while others who are  unable to walk on a treadmill due to physical limitations have a pharmacologic/chemical stress agent called Lexiscan . This medicine will mimic walking on a treadmill by temporarily increasing your coronary blood flow.   Please note: these test may take anywhere between 2-4 hours to complete  PLEASE REPORT TO [place] AT THE FIRST DESK WILL DIRECT YOU WHERE TO GO  Date of Procedure:_______May __4th____________________________  Arrival Time for Procedure:______7:45  am________________________  Instructions regarding medication:   ___ : Take half dose of  diabetes medication morning of procedure and hold insulin night before     ________________________________________________________________________________________________________________________________________________________________________________________________________________________________________________________________________________________  PLEASE NOTIFY THE OFFICE AT LEAST 24 HOURS IN ADVANCE IF YOU ARE UNABLE TO [place].  [phone] AND  PLEASE NOTIFY NUCLEAR MEDICINE AT [facility] AT LEAST 24 HOURS IN ADVANCE IF YOU ARE UNABLE TO KEEP YOUR APPOINTMENT. [phone]  How to prepare for your Myoview test:  1. Do not eat or drink after midnight 2. No caffeine for 24 hours prior to test 3. No smoking 24 hours prior to test. 4. Your medication may be taken with water.  If your doctor stopped a medication because of this test, do not take that medication. 5. Ladies, please do not wear dresses.  Skirts or pants are appropriate. Please wear a short sleeve shirt. 6. No perfume, cologne or lotion. 7. Wear comfortable walking shoes. No heels!       Your physician has requested that you have an echocardiogram. Echocardiography is a painless test that uses sound waves to create images of your heart. It provides your doctor with information about the size and shape of your heart and how well your heart's chambers and valves are working. This procedure takes approximately one hour. There are no restrictions for this procedure.   Your physician has recommended you make the following change in your medication: Take a extra dose of lasix ( 40 mg ) twice a week till follow up with [name]

## 2015-03-02 NOTE — Progress Notes (Signed)
skf      Patient Care Team: [name], MD as PCP - General (Family Medicine)   HPI  Andres Gandy Sr. is a 74 y.o. male Seen in followup for ICD implanted for ischemic/nonischemic cardiomyopathy  and inducible ventricular tachycardia -April 2013 catheterization April 2013 demonstrating patent single vein graft to the OM. bypass had been  done emergently in 1994  EF was 25%  echo 2011 at [facility] was 14%  He has permanent atrial fibrillation since April 2013. He has infrequent days are much worse than others.   He has increasing dyspnea exertion over the last year. He denies chest discomfort. He has not noted peripheral edema. He does have some orthopnea. He says that his wife has noted that he has stopped bleeding even with CPAP. It has been years since his CPAP card was read.  He has noted an increase in weight.  Past Medical History  Diagnosis Date  . Sleep apnea     On CPAP  . Ischemic cardiomyopathy     Status post CABG in 1994; echo/Myoview 2011-12 ejection fraction 30-35%  . Ventricular tachycardia -inducible at EP testing     ppalpitations  . 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 Chronic systolic congestive heart failure   . ICD (implantable cardiac defibrillator) in place   . Type II diabetes mellitus   . Nephrolithiasis 1970's  . Coronary artery disease   . Hyperlipidemia   . Atrial fibrillation, persistent-long-term     Past Surgical History  Procedure Laterality Date  . Coronary artery bypass graft  1994    [facility] ; CABG X1  . Cataract extraction w/ intraocular lens  implant, bilateral  2011  . Total knee arthroplasty  2011    left  . Joint replacement    . Knee arthroscopy  2012    right  . Insert / replace / remove pacemaker  02/28/12    "w/defibrillator"  . Cardioversion  03/01/2012    Procedure: CARDIOVERSION;  Surgeon: Deboraha Sprang, MD;  Location: [facility] OR;  Service: Cardiovascular;  Laterality: N/A;  . Cardiac catheterization  02/28/2012    Minor  disease in LAD and RCA, LCX: 70% mid and occluded distally. Patent SVG to OM3  . Basel cell removal      left arm  . Electrophysiology study N/A 02/28/2012    Procedure: ELECTROPHYSIOLOGY STUDY;  Surgeon: Deboraha Sprang, MD;  Location: [facility] CATH LAB;  Service: Cardiovascular;  Laterality: N/A;  . Left heart catheterization with coronary/graft angiogram N/A 02/28/2012    Procedure: LEFT HEART CATHETERIZATION WITH [name];  Surgeon: [name], MD;  Location: [facility] CATH LAB;  Service: Cardiovascular;  Laterality: N/A;    Current Outpatient Prescriptions  Medication Sig Dispense Refill  . carvedilol (COREG) 3.125 MG tablet Take 1 tablet (3.125 mg total) by mouth 2 (two) times daily. 180 tablet 3  . Cinnamon 500 MG TABS Take 1,000 mg by mouth 2 (two) times daily.     . Coenzyme Q10 (CO Q 10 PO) Take 300 mg by mouth daily.    . furosemide (LASIX) 20 MG tablet Take 1 tablet (20 mg total) by mouth daily. 90 tablet 3  . GARLIC PO Take 1 capsule by mouth daily.     . Glucosamine-Chondroit-Vit C-Mn (GLUCOSAMINE CHONDR 1500 COMPLX PO) Take 1,500 mg by mouth 2 (two) times daily.     . insulin NPH Human (HUMULIN N,NOVOLIN N) 100 UNIT/ML injection Inject 75 Units into the  skin at bedtime.    . insulin regular (NOVOLIN R,HUMULIN R) 100 units/mL injection Inject 30 Units into the skin 3 (three) times daily before meals.     . metFORMIN (GLUCOPHAGE) 500 MG tablet Take 500 mg by mouth 2 (two) times daily with a meal.    . methotrexate (RHEUMATREX) 2.5 MG tablet Take by mouth as directed. Caution:Chemotherapy. Protect from light.    . Multiple Vitamin (MULTIVITAMIN) capsule Take 1 capsule by mouth daily.    . Omega-3 Fatty Acids (FISH OIL PO) Take by mouth 2 (two) times daily.     . pioglitazone (ACTOS) 15 MG tablet Take 15 mg by mouth daily.    . ramipril (ALTACE) 5 MG capsule Take 1 capsule (5 mg total) by mouth daily. 30 capsule 6  . rivaroxaban (XARELTO) 20 MG TABS tablet Take 1 tablet (20 mg  total) by mouth daily. 30 tablet 6  . rosuvastatin (CRESTOR) 10 MG tablet Take 10 mg by mouth daily.     No current facility-administered medications for this visit.    No Known Allergies  Review of Systems negative except from HPI and PMH  Physical Exam BP 110/80 mmHg  Pulse 95  Ht 5\' 10"  (1.778 m)  Wt 230 lb 8 oz (104.554 kg)  BMI 33.07 kg/m2 Well developed and nourished in no acute distress HENT normal Neck supple with JVP-8-9 Clear Device pocket well healed; without hematoma or erythema.  There is no tethering  Regular rate and rhythm, no murmurs or gallops Abd-soft with active BS No Clubbing cyanosis tr edema Skin-warm and dry A & Oriented  Grossly normal sensory and motor function  Atrial fibrillation at 73 Intervals-/12/42 Essentially unchanged from 2014  Assessment and  Plan  Congestive heart failure-chronic-systolic  Cardiomyopathy ischemic/nonischemic  Orthostatic intolerance  Obstructive sleep apnea  Ventricular tachycardia-inducible  No intercurrent Ventricular tachycardia   ICD single chamber-St. Jude The patient's device was interrogated.  The information was reviewed. No changes were made in the programming.    He has increasing problems with dyspnea on exertion. This is unaccompanied by chest discomfort. However, in the context of his diabetes that is not particularly reassuring. I think the differential includes Progressive LV dysfunction and heart failure given his ejection fraction last measured  2011 was about 15%.  His blood pressure mitigates up titration of his medications as well as precludes almost certainly the addition of Aldactone.  He is having orthostatic intolerance. We will keep him on his afterload his medications, but I have encouraged him to increase the level of the head of his bed 4-6 inches.  His been years since his CPAP has been adjusted. According to him, his wife says that he continues to have apneic episodes. He will  discuss this with his PCP and I will be in touch with him also  We spent more than 50% of our >45 min visit in face to face counseling regarding the above  Need to get outside labs as Cr and K have been elevated

## 2015-03-02 NOTE — Telephone Encounter (Signed)
S/w Santiago Glad at [facility] to schedule a lexi on May 4 @ 7:45. Pt aware. [place] QDIY#[id number] EXP 04-16-15 . Placed in [place].

## 2015-03-03 ENCOUNTER — Telehealth: Payer: Self-pay | Admitting: *Deleted

## 2015-03-03 NOTE — Telephone Encounter (Signed)
Dr. Caryl Comes requested recent lab work be faxed from Dr. Baldemar Lenis office.  S/w [place] @ [phone] and will fax over results in [place] to Dr. Caryl Comes.

## 2015-03-03 NOTE — Telephone Encounter (Signed)
S/w pt to verify insulin dose for stress test, 1/2 dose of insulin the night before test and no insulin the day of test.  Pt stated verbal understanding.

## 2015-03-03 NOTE — Telephone Encounter (Signed)
Also let pt know to hold metformin stated takes medication the night before so pt does not have to hold.  Pt stated verbal understanding.

## 2015-03-12 ENCOUNTER — Other Ambulatory Visit: Payer: Self-pay | Admitting: Internal Medicine

## 2015-03-12 DIAGNOSIS — R0600 Dyspnea, unspecified: Secondary | ICD-10-CM

## 2015-03-19 NOTE — Discharge Summary (Signed)
PATIENT NAME:  Andres [name], Andres [name] MR#:  [id number] DATE OF BIRTH:  [birth year]  DATE OF ADMISSION:  01/15/2013 DATE OF DISCHARGE:  01/18/2013  ADMITTING DIAGNOSIS: Degenerative arthrosis of the right knee.   DISCHARGE DIAGNOSIS: Degenerative arthrosis of the right knee.   HISTORY: The patient is 74 year old white who has been followed at [facility] for progression of right knee pain for quite some time. The patient underwent a right knee arthroscopy for a partial medial meniscectomy as well as chondroplasty in September 2011. 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  He has tried to remain active with exercise program. At the time of surgery, he was not using any ambulatory aid. The patient states that his pain had been gradually increasing in intensity. He had localized most of the pain along the medial aspect of the knee. His pain was aggravated with weight-bearing activities. He was also having some anterior knee pain that was aggravated with ascending and descending stairs as well as walking on an uneven surface. The patient had reported occasional swelling as well as near giving way of the knee, but denied any gross locking. The pain had progressed to the point that it was significantly interfering with his activities of daily living. X-rays taken in [facility] showed narrowing of the medial compartment space with associated varus alignment. He was noted to have osteophyte as well as subchondral sclerosis. After discussion of the risks and benefits of surgical intervention, the patient expressed his understanding of the risks and benefits and agreed with plans for surgical intervention.   PROCEDURE:  Right total knee arthroplasty using computer-assisted navigation.   ANESTHESIA: Femoral nerve block with spinal.   SOFT  TISSUE RELEASED;  Anterior cruciate  ligament, posterior cruciate ligament, deep and superficial medial collateral ligaments, as well as the patellofemoral ligament.   IMPLANTS UTILIZED: DePuy PFC Sigma size 4 posterior stabilized femoral component (cemented), size 5 MBT tibial component (cemented), 38 mm 3-pegged oval dome patella (cemented), and a 10 mm stabilized rotating platform polyethylene insert.   HOSPITAL COURSE: The patient tolerated the procedure very well. He had no complications. He was then taken to PAC-U where he was started on Xarelto 20 mg daily. He was fitted with TED stockings bilaterally. These were allowed to be removed 1 hour per 8 hour shift. The right one was applied on day 2 following removal of the Hemovac and dressing change. The patient's heels were elevated off the bed using rolled towels. No tissue breakdown was noted.   The patient has denied any chest pain or shortness of breath. Vital signs have been stable. He has been afebrile. Hemodynamically he was stable and no transfusions were given other than the Autovac transfusions given the first 6 hours postoperatively.   Physical therapy was initiated on day 1 for gait training and transfers. The patient has done very well. Upon being discharged was ambulating greater than 200 feet. He was able go up and down 4 sets of steps. He was independent with bed to chair transfers. Occupational therapy was also initiated on day 1 for ADL and assistive devices. Overall he has done extremely well and has progressed very nicely, according to his schedule.   The patient's IV, Foley and Hemovac were discontinued on day 2 along with a dressing change. The wound was free of any drainage or signs of infection. There was very minimal swelling.  The Polar Care was reapplied to the surgical leg maintaining a temperature of 40 to 50 degrees Fahrenheit.   DISPOSITION: The patient is being discharged to home in improved stable condition.   DISCHARGE INSTRUCTIONS:  1.  He may weight  bear as tolerated. Continue using a walker until cleared by physical therapy to go to a quad cane.  2.  Home health patient.  3.  Continue TED stockings. These are to be worn during the day, but may be removed at night.  4.  Polar Care to the surgical leg maintaining a temperature of 40 to 50 degrees Fahrenheit.  5.  He is placed on an ADA diet.  6.  Resume regular medication that he was on prior to admission.  7.  A prescription for Roxicodone 5 to 10 mg every 4 to 6 hours p.r.n. for pain was given. 8.  Tramadol 50 to 100 mg q. 4 to 6 hours p.r.n. for pain was given.  9.  He has a followup appointment in the Clinic on March 6th. He is to call the Clinic sooner if any temperatures of 101.5 or greater or excessive bleeding.   PAST MEDICAL HISTORY: 1.  Hyperlipidemia.  2.  Diabetes. 3.  Coronary artery disease.  4.  Sleep apnea.  5.  Prostatitis. ____________________________ Vance Peper, PA jrw:sb D: 01/17/2013 07:34:00 ET T: 01/17/2013 11:22:09 ET JOB#: [id number]  cc: Vance Peper, PA, <Dictator> Mathilda Maguire PA ELECTRONICALLY SIGNED 01/19/2013 18:26

## 2015-03-19 NOTE — Op Note (Signed)
PATIENT NAME:  Andres [name], Andres [name] MR#:  [id number] DATE OF BIRTH:  1941/09/20  DATE OF PROCEDURE:  01/15/2013  PREOPERATIVE DIAGNOSIS:  Degenerative arthrosis of the right knee.   POSTOPERATIVE DIAGNOSIS:  Degenerative arthrosis of the right knee.   PROCEDURE PERFORMED:  Right total knee arthroplasty using computer-assisted navigation.   SURGEON:  Dr. Laurice Record. Hooten.   ASSISTANT:  Vance Peper, PA.   ANESTHESIA:  Femoral nerve block and spinal.   ESTIMATED BLOOD LOSS:  50 mL.   FLUIDS REPLACED:  1000 mL of crystalloid.   TOURNIQUET TIME:  80 minutes.   DRAINS:  Two medium drains to reinfusion system.   SOFT TISSUE RELEASES:  Anterior cruciate ligament, posterior cruciate ligament, deep and superficial medial collateral ligament, and patellofemoral ligament.   IMPLANTS UTILIZED:  DePuy PFC Sigma size 4 posterior stabilized femoral component (cemented), size 5 MBT tibial component (cemented), 38 mm 3 peg oval dome patella (cemented), and a 10 mm stabilized rotating platform polyethylene insert.   INDICATIONS FOR SURGERY:  The patient is a 74 year old gentleman who has been seen for complaints of progressive right knee pain.  X-rays demonstrated severe degenerative changes in tricompartmental fashion with relative varus deformity.  After discussion of risks and benefits of surgical intervention, the patient expressed understanding of the risks and benefits and agreed with plans for surgical intervention.   PROCEDURE IN DETAIL:  The patient was brought into the operating room and, after adequate femoral nerve block and spinal anesthesia was achieved, a tourniquet was placed on the patient's upper right thigh.  The patient's right knee and leg were cleaned and prepped with alcohol and DuraPrep draped in the usual sterile fashion.  A "timeout" was performed as per usual protocol.  The right lower extremity was exsanguinated using an Esmarch, tourniquet was inflated to 300 mmHg.  An anterior  longitudinal incision was made followed by a standard mid vastus approach.  A moderate effusion was evacuated.  The deep fibers of the medial collateral ligament were elevated in a subperiosteal fashion off the medial flare of the tibia so as to maintain a continuous soft tissue sleeve.  The patella was subluxed laterally and the patellofemoral ligament was incised.  Inspection of the knee demonstrated severe degenerative changes in tricompartmental fashion.  Prominent osteophytes were debrided using a rongeur.  Anterior and posterior cruciate ligaments were excised.  Two 4.0 mm Schanz pins were inserted into the femur and into the tibia for attachment of the array of trackers used for computer-assisted navigation.  Hip center was identified using circumduction technique.  Distal landmarks were mapped using the computer.  The distal femur and proximal tibia were mapped using the computer.  Distal femoral cutting guide was positioned using computer-assisted navigation so as to achieve a 5 degree distal valgus cut.  Cut was performed and verified using the computer.  The distal femur was sized and it was felt that a size 4 femoral component was appropriate.  A size 4 cutting guide was positioned and the anterior cut was performed and verified using the computer.  This was followed by completion of posterior and chamfer cuts.  Femoral cutting guide for the central box was then positioned and the central box cut was performed.    Attention was then directed to the proximal tibia.  Medial and lateral menisci were excised.  The extramedullary tibial cutting guide was positioned using computer-assisted navigation so as to achieve 0 degree of varus valgus alignment and 0 degree posterior slope.  Cut  was performed and verified using the computer.  The proximal tibia was sized and it was felt that a size 5 tibial tray was appropriate.  Tibial and femoral trials were inserted followed by insertion of a 10 mm polyethylene  insert.  The knee was felt to be tight medially.  A Cobb elevator was used to elevate the superficial fibers of the medial collateral ligament.  This allowed for excellent mediolateral soft tissue balancing in extension and in flexion.  Finally, the patella was cut and prepared so as to accommodate a 38 mm 3 peg oval dome patella.  Patellar trial was placed and the knee was placed through a range of motion with excellent patellar tracking appreciated.   Femoral trial was removed after debridement of posterior osteophytes.  Central post hole for the tibial component was reamed followed by insertion of a keel punch.  Tibial tray was then removed.  Cut surfaces of bone were irrigated with copious amounts of normal saline with antibiotic solution using pulsatile lavage and then suctioned dry.  Polymethyl methacrylate cement with gentamicin was prepared in the usual fashion using a vacuum mixer.  Cement was applied to the cut surface of the proximal tibia as well as along the undersurface of a size 5 MBT tibial component.  The tibial component was positioned and impacted into place.  Excess cement was removed using freer elevators.  Cement was then applied to the cut surface of the femur as well as along the posterior flanges of a size 4 posterior stabilized femoral component.  Femoral component was positioned and impacted into place.  Excess cement was removed using freer elevators.  A 10 mm polyethylene trial was inserted and the knee was brought in full extension with steady axial compression applied.  Finally, cement was applied to backside of a 38 mm 3 peg oval dome patella and the patellar component was positioned and patellar clamp applied.  Excess cement was removed using freer elevators.   After adequate curing of cement, the tourniquet was deflated after a total tourniquet time of 80 minutes.  Hemostasis was achieved using electrocautery.  The knee was irrigated with copious amounts of normal saline with  antibiotic solution using pulsatile lavage and then suctioned dry.  The knee was inspected for any residual cement debris.  30 mL of 0.25% Marcaine with epinephrine was injected along the posterior capsule.  A 10 mm stabilized rotating platform polyethylene insert was inserted and the knee was placed through a range of motion with excellent patellar tracking appreciated and excellent mediolateral soft tissue balancing both in full extension and in flexion.  Two medium drains were placed in the wound bed and brought out through a separate stab incision to be attached to reinfusion system.  The medial parapatellar portion of the incision was reapproximated using interrupted sutures of #1 Vicryl.  The subcutaneous tissue was approximated in layers using first #0 Vicryl followed by #2-0 Vicryl.  Skin was closed with skin staples.  A sterile dressing was applied.    The patient tolerated procedure well.  He was transported to the recovery room in stable condition.      ____________________________ Laurice Record. Holley Bouche., MD jph:ea D: 01/15/2013 22:36:05 ET T: 01/16/2013 06:58:12 ET JOB#: [id number]  cc: Laurice Record. Holley Bouche., MD, <Dictator> Laurice Record Holley Bouche MD ELECTRONICALLY SIGNED 01/19/2013 15:13

## 2015-03-25 ENCOUNTER — Ambulatory Visit: Payer: Medicare Other | Admitting: Cardiovascular Disease

## 2015-03-29 ENCOUNTER — Encounter: Payer: Self-pay | Admitting: Cardiovascular Disease

## 2015-03-29 ENCOUNTER — Ambulatory Visit (INDEPENDENT_AMBULATORY_CARE_PROVIDER_SITE_OTHER): Payer: Medicare Other | Admitting: Cardiovascular Disease

## 2015-03-29 VITALS — BP 100/68 | HR 76 | Ht 70.0 in | Wt 224.5 lb

## 2015-03-29 DIAGNOSIS — E785 Hyperlipidemia, unspecified: Secondary | ICD-10-CM

## 2015-03-29 DIAGNOSIS — I4819 Other persistent atrial fibrillation: Secondary | ICD-10-CM

## 2015-03-29 DIAGNOSIS — I5022 Chronic systolic (congestive) heart failure: Secondary | ICD-10-CM | POA: Diagnosis not present

## 2015-03-29 DIAGNOSIS — I481 Persistent atrial fibrillation: Secondary | ICD-10-CM

## 2015-03-29 DIAGNOSIS — I25118 Atherosclerotic heart disease of native coronary artery with other forms of angina pectoris: Secondary | ICD-10-CM

## 2015-03-29 NOTE — Assessment & Plan Note (Signed)
Continue treatment with rosuvastatin. He is not able to tolerate higher doses due to myalgia.

## 2015-03-29 NOTE — Assessment & Plan Note (Signed)
He appears to be euvolemic on current medications. Currently New [place] Heart Association class II. He had labs done in March with his primary care physician which showed normal renal function and electrolytes.

## 2015-03-29 NOTE — Progress Notes (Signed)
Electrophysiologist: Dr. Caryl Comes.  HPI  This is a 74 year old male who is here today for a followup . He has known history of coronary artery disease with nonischemic cardiomyopathy and chronic atrial fibrillation.  He had cardiac catheterization done in April of 2013 which showed patent SVG to OM with severely reduced LV systolic function(EF of 19-14%).  He had an EP study done by Dr. Caryl Comes which showed induced ventricular tachycardia. He had an ICD placement done. He has chronic atrial fibrillation treated with rate control. He has been doing well since last visit. He did not tolerate higher dose of rosuvastatin due to myalgia. He denies chest pain or significant dyspnea. Overall, he has been doing very well. He was on vacation in [place] recently and played  golf  for 10 days.  No Known Allergies   Current Outpatient Prescriptions on File Prior to Visit  Medication Sig Dispense Refill  . carvedilol (COREG) 3.125 MG tablet Take 1 tablet (3.125 mg total) by mouth 2 (two) times daily. 180 tablet 3  . Cinnamon 500 MG TABS Take 1,000 mg by mouth 2 (two) times daily.     . Coenzyme Q10 (CO Q 10 PO) Take 300 mg by mouth daily.    . furosemide (LASIX) 20 MG tablet Take 1 tablet (20 mg total) by mouth daily. Take an extra does of lasix ( 20 mg ) twice a week till office follow up 90 tablet 3  . GARLIC PO Take 1 capsule by mouth daily.     . Glucosamine-Chondroit-Vit C-Mn (GLUCOSAMINE CHONDR 1500 COMPLX PO) Take 1,500 mg by mouth 2 (two) times daily.     . insulin NPH Human (HUMULIN N,NOVOLIN N) 100 UNIT/ML injection Inject 75 Units into the skin at bedtime.    . insulin regular (NOVOLIN R,HUMULIN R) 100 units/mL injection Inject 30 Units into the skin 3 (three) times daily before meals.     . metFORMIN (GLUCOPHAGE) 500 MG tablet Take 500 mg by mouth daily with breakfast.     . methotrexate (RHEUMATREX) 2.5 MG tablet Take by mouth as directed. Caution:Chemotherapy. Protect from light.    . Multiple  Vitamin (MULTIVITAMIN) capsule Take 1 capsule by mouth daily.    . Omega-3 Fatty Acids (FISH OIL PO) Take by mouth 2 (two) times daily.     . pioglitazone (ACTOS) 15 MG tablet Take 15 mg by mouth daily.    . ramipril (ALTACE) 5 MG capsule Take 1 capsule (5 mg total) by mouth daily. 30 capsule 6  . rivaroxaban (XARELTO) 20 MG TABS tablet Take 1 tablet (20 mg total) by mouth daily. 30 tablet 6  . rosuvastatin (CRESTOR) 10 MG tablet Take 10 mg by mouth daily.     No current facility-administered medications on file prior to visit.     Past Medical History  Diagnosis Date  . Sleep apnea     On CPAP  . Ischemic cardiomyopathy     Status post CABG in 1994; echo/Myoview 2011-12 ejection fraction 30-35%  . Ventricular tachycardia -inducible at EP testing     ppalpitations  . 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 Chronic systolic congestive heart failure   . ICD (implantable cardiac defibrillator) in place   . Type II diabetes mellitus   . Nephrolithiasis 1970's  . Coronary artery disease   . Hyperlipidemia   . Atrial fibrillation, persistent-long-term      Past Surgical History  Procedure Laterality Date  . Coronary artery bypass graft  [place] ; CABG X1  . Cataract extraction w/ intraocular lens  implant, bilateral  2011  . Total knee arthroplasty  2011    left  . Joint replacement    . Knee arthroscopy  2012    right  . Insert / replace / remove pacemaker  02/28/12    "w/defibrillator"  . Cardioversion  03/01/2012    Procedure: CARDIOVERSION;  Surgeon: Deboraha Sprang, MD;  Location: [facility] OR;  Service: Cardiovascular;  Laterality: N/A;  . Cardiac catheterization  02/28/2012    Minor disease in LAD and RCA, LCX: 70% mid and occluded distally. Patent SVG to OM3  . Basel cell removal      left arm  . Electrophysiology study N/A 02/28/2012    Procedure: ELECTROPHYSIOLOGY STUDY;  Surgeon: Deboraha Sprang, MD;  Location: [facility] CATH LAB;  Service: Cardiovascular;  Laterality: N/A;  .  Left heart catheterization with coronary/graft angiogram N/A 02/28/2012    Procedure: LEFT HEART CATHETERIZATION WITH [name];  Surgeon: [name], MD;  Location: [facility] CATH LAB;  Service: Cardiovascular;  Laterality: N/A;     Family History  Problem Relation Age of Onset  . Heart attack Father      History   Social History  . Marital Status: Married    Spouse Name: N/A  . Number of Children: 3  . Years of Education: N/A   Occupational History  . Not on file.   Social History Main Topics  . Smoking status: Former Smoker -- 1.00 packs/day for 25 years    Types: Cigarettes    Quit date: 01/09/1988  . Smokeless tobacco: Never Used  . Alcohol Use: No  . Drug Use: No  . Sexual Activity: No   Other Topics Concern  . Not on file   Social History Narrative     PHYSICAL EXAM   BP 100/68 mmHg  Pulse 76  Ht 5\' 10"  (1.778 m)  Wt 224 lb 8 oz (101.833 kg)  BMI 32.21 kg/m2  Constitutional: He is oriented to person, place, and time. He appears well-developed and well-nourished. No distress.  HENT: No nasal discharge.  Head: Normocephalic and atraumatic.  Eyes: Pupils are equal and round. Right eye exhibits no discharge. Left eye exhibits no discharge.  Neck: Normal range of motion. Neck supple. No JVD present. No thyromegaly present.  Cardiovascular: Normal rate, irregular rhythm, normal heart sounds and. Exam reveals no gallop and no friction rub. No murmur heard.  Pulmonary/Chest: Effort normal. No stridor. No respiratory distress. He has no wheezes. He has bibasilar crackles more at the right base.  Abdominal: Soft. Bowel sounds are normal. He exhibits no distension. There is no tenderness. There is no rebound and no guarding.  Musculoskeletal: Normal range of motion. He exhibits no edema and no tenderness.  Neurological: He is alert and oriented to person, place, and time. Coordination normal.  Skin: Skin is warm and dry. No rash noted. He is not  diaphoretic. No erythema. No pallor.  Psychiatric: He has a normal mood and affect. His behavior is normal. Judgment and thought content normal.   XVE:ZBMZTA flutter-fibrillation  Low voltage in precordial leads.   -  ABNORMAL       ASSESSMENT AND PLAN

## 2015-03-29 NOTE — Patient Instructions (Signed)
Continue same medications.   Your physician wants you to follow-up in: 6 months.  You will receive a reminder letter in the mail two months in advance. If you don't receive a letter, please call our office to schedule the follow-up appointment.  

## 2015-03-29 NOTE — Assessment & Plan Note (Signed)
Ventricular rate is well controlled. He is tolerating anticoagulation with no side effects.

## 2015-03-29 NOTE — Assessment & Plan Note (Signed)
Due to exertional dyspnea, he was scheduled for a nuclear stress test by Dr. Caryl Comes.

## 2015-03-30 ENCOUNTER — Ambulatory Visit (INDEPENDENT_AMBULATORY_CARE_PROVIDER_SITE_OTHER): Payer: Medicare Other

## 2015-03-30 ENCOUNTER — Other Ambulatory Visit: Payer: Self-pay

## 2015-03-30 ENCOUNTER — Other Ambulatory Visit: Payer: Medicare Other

## 2015-03-30 DIAGNOSIS — I255 Ischemic cardiomyopathy: Secondary | ICD-10-CM

## 2015-03-30 DIAGNOSIS — I481 Persistent atrial fibrillation: Secondary | ICD-10-CM | POA: Diagnosis not present

## 2015-03-30 DIAGNOSIS — I4819 Other persistent atrial fibrillation: Secondary | ICD-10-CM

## 2015-03-30 DIAGNOSIS — Z95810 Presence of automatic (implantable) cardiac defibrillator: Secondary | ICD-10-CM | POA: Diagnosis not present

## 2015-03-30 DIAGNOSIS — I5022 Chronic systolic (congestive) heart failure: Secondary | ICD-10-CM | POA: Diagnosis not present

## 2015-03-31 ENCOUNTER — Encounter
Admission: RE | Admit: 2015-03-31 | Discharge: 2015-03-31 | Disposition: A | Discharge status: Home / Self Care | Payer: Medicare Other | Source: Ambulatory Visit | Attending: Internal Medicine | Admitting: Internal Medicine

## 2015-03-31 ENCOUNTER — Encounter: Admission: RE | Admit: 2015-03-31 | Payer: Medicare Other | Source: Ambulatory Visit

## 2015-03-31 ENCOUNTER — Encounter: Payer: Self-pay | Admitting: Physician Assistant

## 2015-03-31 DIAGNOSIS — R0600 Dyspnea, unspecified: Secondary | ICD-10-CM | POA: Insufficient documentation

## 2015-03-31 MED ORDER — TECHNETIUM TC 99M SESTAMIBI - CARDIOLITE
13.9500 | Freq: Once | INTRAVENOUS | Status: AC | PRN
  Administered 2015-03-31: 13.95 via INTRAVENOUS

## 2015-03-31 MED ORDER — REGADENOSON 0.4 MG/5ML IV SOLN
0.4000 mg | Freq: Once | INTRAVENOUS | Status: AC
  Administered 2015-03-31: 0.4 mg via INTRAVENOUS

## 2015-03-31 MED ORDER — TECHNETIUM TC 99M SESTAMIBI - CARDIOLITE
30.0000 | Freq: Once | INTRAVENOUS | Status: AC | PRN
  Administered 2015-03-31: 32.35 via INTRAVENOUS

## 2015-03-31 NOTE — CV Procedure (Signed)
    Ordering MD: Dr. Caryl Comes, MD  Test type: Lexiscan Myoview  EKG: a-fib, 74, rare PVC, <1 mm st depression II, aVF, V4-V5  Pulse: 75  BP: 122/82  Max heart rate: 85  Max BP: 122/82  %HR: 58%  Symptoms: None  EKG changes: no changes  Myoview Images to follow.    Christell Faith, PA-C 03/31/2015 9:52 AM

## 2015-04-01 LAB — NM MYOCAR MULTI W/SPECT W/WALL MOTION / EF
CHL CUP NUCLEAR SDS: 5
CHL CUP STRESS STAGE 3 HR: 63 {beats}/min
CHL CUP STRESS STAGE 4 SPEED: 0 mph
CHL CUP STRESS STAGE 5 DBP: 64 mmHg
CHL CUP STRESS STAGE 5 GRADE: 0 %
CSEPEW: 1 METS
CSEPPMHR: 50 %
LVDIAVOL: 172 mL
LVSYSVOL: 116 mL
NUC STRESS EF: 29 %
Peak HR: 73 {beats}/min
SRS: 22
SSS: 24
Stage 1 HR: 67 {beats}/min
Stage 2 Grade: 0 %
Stage 2 HR: 64 {beats}/min
Stage 2 Speed: 0 mph
Stage 3 Grade: 0 %
Stage 3 Speed: 0 mph
Stage 4 Grade: 0 %
Stage 4 HR: 73 {beats}/min
Stage 5 HR: 79 {beats}/min
Stage 5 SBP: 107 mmHg
Stage 5 Speed: 0 mph
Stage 6 DBP: 64 mmHg
Stage 6 Grade: 0 %
Stage 6 HR: 77 {beats}/min
Stage 6 SBP: 107 mmHg
Stage 6 Speed: 0 mph
TID: 1.16

## 2015-04-05 ENCOUNTER — Other Ambulatory Visit: Payer: Self-pay

## 2015-04-05 MED ORDER — RIVAROXABAN 20 MG PO TABS: 20.0000 mg | ORAL_TABLET | Freq: Every day | ORAL | Status: DC

## 2015-04-05 MED ORDER — CARVEDILOL 3.125 MG PO TABS: 3.1250 mg | ORAL_TABLET | Freq: Two times a day (BID) | ORAL | Status: DC

## 2015-04-05 NOTE — Telephone Encounter (Signed)
Refill sent for xarelto.

## 2015-04-05 NOTE — Telephone Encounter (Signed)
Refill sent for coreg 3.125 mg one tablet twice a day.

## 2015-04-07 ENCOUNTER — Other Ambulatory Visit (INDEPENDENT_AMBULATORY_CARE_PROVIDER_SITE_OTHER): Payer: Medicare Other | Admitting: *Deleted

## 2015-04-07 DIAGNOSIS — I5022 Chronic systolic (congestive) heart failure: Secondary | ICD-10-CM | POA: Diagnosis not present

## 2015-04-08 ENCOUNTER — Other Ambulatory Visit: Payer: Self-pay

## 2015-04-08 ENCOUNTER — Other Ambulatory Visit: Payer: Medicare Other

## 2015-04-08 ENCOUNTER — Telehealth: Payer: Self-pay

## 2015-04-08 DIAGNOSIS — Z95810 Presence of automatic (implantable) cardiac defibrillator: Secondary | ICD-10-CM

## 2015-04-08 DIAGNOSIS — I5022 Chronic systolic (congestive) heart failure: Secondary | ICD-10-CM

## 2015-04-08 DIAGNOSIS — I255 Ischemic cardiomyopathy: Secondary | ICD-10-CM

## 2015-04-08 DIAGNOSIS — I4819 Other persistent atrial fibrillation: Secondary | ICD-10-CM

## 2015-04-08 LAB — BASIC METABOLIC PANEL
BUN/Creatinine Ratio: 13 (ref 10–22)
BUN: 18 mg/dL (ref 8–27)
CALCIUM: 9.2 mg/dL (ref 8.6–10.2)
CHLORIDE: 97 mmol/L (ref 97–108)
CO2: 21 mmol/L (ref 18–29)
CREATININE: 1.37 mg/dL — AB (ref 0.76–1.27)
GFR calc Af Amer: 58 mL/min/{1.73_m2} — ABNORMAL LOW (ref >59)
GFR calc non Af Amer: 50 mL/min/{1.73_m2} — ABNORMAL LOW (ref >59)
Glucose: 237 mg/dL — ABNORMAL HIGH (ref 65–99)
Potassium: 5.2 mmol/L (ref 3.5–5.2)
Sodium: 137 mmol/L (ref 134–144)

## 2015-04-08 NOTE — Telephone Encounter (Signed)
Patient's wife [name] is confused on instructions for the Lasix (furosemide) 20 mg. Looking in the patient's chart under last office note and lab there is several instructions, if you could please review and verify so I can send a new Rx.

## 2015-04-09 ENCOUNTER — Telehealth: Payer: Self-pay

## 2015-04-09 DIAGNOSIS — I509 Heart failure, unspecified: Secondary | ICD-10-CM

## 2015-04-09 MED ORDER — SPIRONOLACTONE 25 MG PO TABS: 12.5000 mg | ORAL_TABLET | Freq: Every day | ORAL | Status: DC

## 2015-04-09 NOTE — Telephone Encounter (Signed)
Notes Recorded by [name], MD on 04/08/2015 at 4:52 PM Start spironolactone 12.5 mg once daily. Check basic metabolic profile in one week and again in one month. We can keep him that on the same dose of Lasix for now. It seems that he has been on Actos for a while but he should discuss with his primary care physician about alternatives given that he has congestive heart failure.  S/W pt wife regarding Dr. Tyrell Antonio recommendations. Verbalized understanding with no further questions.

## 2015-04-09 NOTE — Telephone Encounter (Signed)
See telephone calls from Lysle Dingwall.

## 2015-04-09 NOTE — Telephone Encounter (Signed)
There was some confusion on what dose of lasix the patient was supposed to be taking. I reviewed the patient's last echo and labs with [name]. Per [name], since the patient was doing well at his last office visit and since spironolactone is being started, the patient should take lasix 20 mg once daily. He is due for a repeat BMP on 04/16/15.

## 2015-04-09 NOTE — Addendum Note (Signed)
Addended by: Alvis Lemmings C on: 04/09/2015 12:50 PM   Modules accepted: Orders, Medications

## 2015-04-12 NOTE — Progress Notes (Signed)
Pt aware of lab result copy sent to pt and pcp.

## 2015-04-14 ENCOUNTER — Other Ambulatory Visit (INDEPENDENT_AMBULATORY_CARE_PROVIDER_SITE_OTHER): Payer: Medicare Other

## 2015-04-14 DIAGNOSIS — I509 Heart failure, unspecified: Secondary | ICD-10-CM | POA: Diagnosis not present

## 2015-04-15 LAB — BASIC METABOLIC PANEL
BUN/Creatinine Ratio: 19 (ref 10–22)
BUN: 25 mg/dL (ref 8–27)
CHLORIDE: 97 mmol/L (ref 97–108)
CO2: 22 mmol/L (ref 18–29)
CREATININE: 1.33 mg/dL — AB (ref 0.76–1.27)
Calcium: 9.2 mg/dL (ref 8.6–10.2)
GFR calc Af Amer: 60 mL/min/{1.73_m2} (ref >59)
GFR calc non Af Amer: 52 mL/min/{1.73_m2} — ABNORMAL LOW (ref >59)
Glucose: 296 mg/dL — ABNORMAL HIGH (ref 65–99)
Potassium: 4.9 mmol/L (ref 3.5–5.2)
Sodium: 137 mmol/L (ref 134–144)

## 2015-04-16 ENCOUNTER — Other Ambulatory Visit: Payer: Medicare Other

## 2015-05-12 ENCOUNTER — Other Ambulatory Visit (INDEPENDENT_AMBULATORY_CARE_PROVIDER_SITE_OTHER): Payer: Medicare Other | Admitting: *Deleted

## 2015-05-12 DIAGNOSIS — I509 Heart failure, unspecified: Secondary | ICD-10-CM | POA: Diagnosis not present

## 2015-05-13 LAB — BASIC METABOLIC PANEL
BUN/Creatinine Ratio: 14 (ref 10–22)
BUN: 21 mg/dL (ref 8–27)
CALCIUM: 9.2 mg/dL (ref 8.6–10.2)
CHLORIDE: 96 mmol/L — AB (ref 97–108)
CO2: 22 mmol/L (ref 18–29)
CREATININE: 1.45 mg/dL — AB (ref 0.76–1.27)
GFR calc Af Amer: 54 mL/min/{1.73_m2} — ABNORMAL LOW (ref >59)
GFR calc non Af Amer: 47 mL/min/{1.73_m2} — ABNORMAL LOW (ref >59)
Glucose: 245 mg/dL — ABNORMAL HIGH (ref 65–99)
Potassium: 5.1 mmol/L (ref 3.5–5.2)
Sodium: 135 mmol/L (ref 134–144)

## 2015-05-14 ENCOUNTER — Other Ambulatory Visit: Payer: Medicare Other

## 2015-06-01 ENCOUNTER — Ambulatory Visit (INDEPENDENT_AMBULATORY_CARE_PROVIDER_SITE_OTHER): Payer: Medicare Other | Admitting: *Deleted

## 2015-06-01 DIAGNOSIS — I509 Heart failure, unspecified: Secondary | ICD-10-CM | POA: Diagnosis not present

## 2015-06-01 DIAGNOSIS — I255 Ischemic cardiomyopathy: Secondary | ICD-10-CM

## 2015-06-01 NOTE — Progress Notes (Signed)
Remote ICD transmission.   

## 2015-06-04 ENCOUNTER — Encounter: Payer: Self-pay | Admitting: Internal Medicine

## 2015-06-06 LAB — CUP PACEART REMOTE DEVICE CHECK
Battery Remaining Percentage: 71 %
Brady Statistic RV Percent Paced: 1.5 %
Date Time Interrogation Session: 20160705060018
HighPow Impedance: 90 Ohm
HighPow Impedance: 90 Ohm
Lead Channel Impedance Value: 480 Ohm
Lead Channel Pacing Threshold Amplitude: 1 V
Lead Channel Pacing Threshold Pulse Width: 0.5 ms
Lead Channel Sensing Intrinsic Amplitude: 11.7 mV
Lead Channel Setting Pacing Pulse Width: 0.5 ms
MDC IDC MSMT BATTERY REMAINING LONGEVITY: 77 mo
MDC IDC MSMT BATTERY VOLTAGE: 2.98 V
MDC IDC PG SERIAL: 7026078
MDC IDC SET LEADCHNL RV PACING AMPLITUDE: 2.5 V
MDC IDC SET LEADCHNL RV SENSING SENSITIVITY: 0.5 mV
Zone Setting Detection Interval: 250 ms
Zone Setting Detection Interval: 300 ms

## 2015-07-02 ENCOUNTER — Encounter: Payer: Self-pay | Admitting: Cardiology

## 2015-07-08 ENCOUNTER — Other Ambulatory Visit: Payer: Self-pay | Admitting: *Deleted

## 2015-07-08 ENCOUNTER — Telehealth: Payer: Self-pay | Admitting: Cardiovascular Disease

## 2015-07-08 DIAGNOSIS — I509 Heart failure, unspecified: Secondary | ICD-10-CM

## 2015-07-08 MED ORDER — SPIRONOLACTONE 25 MG PO TABS: 12.5000 mg | ORAL_TABLET | Freq: Every day | ORAL | Status: DC

## 2015-07-08 NOTE — Telephone Encounter (Signed)
Notified patient the spironolactone 12.5 mg daily.

## 2015-07-08 NOTE — Telephone Encounter (Signed)
Please clarify with pharmacy correct dosage of Spironolactone 25 mg .  Patient thinks dosage changed and rx fax sent did not reflect.

## 2015-07-09 ENCOUNTER — Encounter: Payer: Self-pay | Admitting: Internal Medicine

## 2015-07-19 ENCOUNTER — Other Ambulatory Visit: Payer: Self-pay | Admitting: *Deleted

## 2015-07-19 DIAGNOSIS — I509 Heart failure, unspecified: Secondary | ICD-10-CM

## 2015-07-19 MED ORDER — SPIRONOLACTONE 25 MG PO TABS: 12.5000 mg | ORAL_TABLET | Freq: Every day | ORAL | Status: DC

## 2015-09-02 ENCOUNTER — Ambulatory Visit (INDEPENDENT_AMBULATORY_CARE_PROVIDER_SITE_OTHER): Payer: Medicare Other | Admitting: *Deleted

## 2015-09-02 ENCOUNTER — Encounter: Payer: Self-pay | Admitting: Internal Medicine

## 2015-09-02 DIAGNOSIS — I255 Ischemic cardiomyopathy: Secondary | ICD-10-CM | POA: Diagnosis not present

## 2015-09-02 DIAGNOSIS — I509 Heart failure, unspecified: Secondary | ICD-10-CM | POA: Diagnosis not present

## 2015-09-02 NOTE — Progress Notes (Signed)
Remote ICD transmission.   

## 2015-09-03 LAB — CUP PACEART REMOTE DEVICE CHECK
Battery Remaining Longevity: 74 mo
Battery Remaining Percentage: 69 %
Battery Voltage: 2.98 V
HIGH POWER IMPEDANCE MEASURED VALUE: 86 Ohm
HighPow Impedance: 86 Ohm
Lead Channel Impedance Value: 430 Ohm
Lead Channel Pacing Threshold Amplitude: 1 V
Lead Channel Sensing Intrinsic Amplitude: 11.7 mV
Lead Channel Setting Pacing Amplitude: 2.5 V
Lead Channel Setting Sensing Sensitivity: 0.5 mV
MDC IDC MSMT LEADCHNL RV PACING THRESHOLD PULSEWIDTH: 0.5 ms
MDC IDC PG SERIAL: 7026078
MDC IDC SESS DTM: 20161006060016
MDC IDC SET LEADCHNL RV PACING PULSEWIDTH: 0.5 ms
MDC IDC SET ZONE DETECTION INTERVAL: 250 ms
MDC IDC STAT BRADY RV PERCENT PACED: 1.6 %
Zone Setting Detection Interval: 300 ms

## 2015-10-06 ENCOUNTER — Encounter: Payer: Self-pay | Admitting: Cardiology

## 2015-10-29 ENCOUNTER — Other Ambulatory Visit: Payer: Self-pay

## 2015-10-29 DIAGNOSIS — I5020 Unspecified systolic (congestive) heart failure: Secondary | ICD-10-CM | POA: Insufficient documentation

## 2015-10-29 MED ORDER — RIVAROXABAN 20 MG PO TABS: 20.0000 mg | ORAL_TABLET | Freq: Every day | ORAL | Status: DC

## 2015-10-29 NOTE — Telephone Encounter (Signed)
Refill sent for xarelto  

## 2015-11-01 ENCOUNTER — Ambulatory Visit (INDEPENDENT_AMBULATORY_CARE_PROVIDER_SITE_OTHER): Payer: Medicare Other | Admitting: Physician Assistant

## 2015-11-01 ENCOUNTER — Ambulatory Visit
Admission: RE | Admit: 2015-11-01 | Discharge: 2015-11-01 | Disposition: A | Discharge status: Home / Self Care | Payer: Medicare Other | Source: Ambulatory Visit | Attending: Physician Assistant | Admitting: Physician Assistant

## 2015-11-01 ENCOUNTER — Other Ambulatory Visit
Admission: RE | Admit: 2015-11-01 | Discharge: 2015-11-01 | Disposition: A | Discharge status: Home / Self Care | Payer: Medicare Other | Source: Ambulatory Visit | Attending: Physician Assistant | Admitting: Physician Assistant

## 2015-11-01 ENCOUNTER — Encounter: Payer: Self-pay | Admitting: Physician Assistant

## 2015-11-01 VITALS — BP 118/70 | HR 79 | Ht 70.0 in | Wt 229.8 lb

## 2015-11-01 DIAGNOSIS — I4819 Other persistent atrial fibrillation: Secondary | ICD-10-CM

## 2015-11-01 DIAGNOSIS — I5022 Chronic systolic (congestive) heart failure: Secondary | ICD-10-CM

## 2015-11-01 DIAGNOSIS — I2510 Atherosclerotic heart disease of native coronary artery without angina pectoris: Secondary | ICD-10-CM | POA: Diagnosis not present

## 2015-11-01 DIAGNOSIS — I4891 Unspecified atrial fibrillation: Secondary | ICD-10-CM | POA: Diagnosis not present

## 2015-11-01 DIAGNOSIS — R0602 Shortness of breath: Secondary | ICD-10-CM | POA: Diagnosis not present

## 2015-11-01 DIAGNOSIS — Z951 Presence of aortocoronary bypass graft: Secondary | ICD-10-CM | POA: Diagnosis not present

## 2015-11-01 DIAGNOSIS — Z95810 Presence of automatic (implantable) cardiac defibrillator: Secondary | ICD-10-CM

## 2015-11-01 DIAGNOSIS — I481 Persistent atrial fibrillation: Secondary | ICD-10-CM

## 2015-11-01 DIAGNOSIS — Z01812 Encounter for preprocedural laboratory examination: Secondary | ICD-10-CM

## 2015-11-01 DIAGNOSIS — Z950 Presence of cardiac pacemaker: Secondary | ICD-10-CM | POA: Insufficient documentation

## 2015-11-01 DIAGNOSIS — I4729 Other ventricular tachycardia: Secondary | ICD-10-CM

## 2015-11-01 DIAGNOSIS — I255 Ischemic cardiomyopathy: Secondary | ICD-10-CM

## 2015-11-01 DIAGNOSIS — E785 Hyperlipidemia, unspecified: Secondary | ICD-10-CM

## 2015-11-01 DIAGNOSIS — I472 Ventricular tachycardia: Secondary | ICD-10-CM

## 2015-11-01 LAB — BASIC METABOLIC PANEL
Anion gap: 6 (ref 5–15)
BUN: 25 mg/dL — ABNORMAL HIGH (ref 6–20)
CO2: 27 mmol/L (ref 22–32)
CREATININE: 1.37 mg/dL — AB (ref 0.61–1.24)
Calcium: 9.3 mg/dL (ref 8.9–10.3)
Chloride: 104 mmol/L (ref 101–111)
GFR calc Af Amer: 57 mL/min — ABNORMAL LOW (ref >60)
GFR calc non Af Amer: 49 mL/min — ABNORMAL LOW (ref >60)
Glucose, Bld: 113 mg/dL — ABNORMAL HIGH (ref 65–99)
Potassium: 4.2 mmol/L (ref 3.5–5.1)
SODIUM: 137 mmol/L (ref 135–145)

## 2015-11-01 LAB — CBC
HCT: 43.3 % (ref 40.0–52.0)
Hemoglobin: 14.2 g/dL (ref 13.0–18.0)
MCH: 33.4 pg (ref 26.0–34.0)
MCHC: 32.9 g/dL (ref 32.0–36.0)
MCV: 101.8 fL — ABNORMAL HIGH (ref 80.0–100.0)
PLATELETS: 204 10*3/uL (ref 150–440)
RBC: 4.25 MIL/uL — AB (ref 4.40–5.90)
RDW: 15.9 % — AB (ref 11.5–14.5)
WBC: 8.5 10*3/uL (ref 3.8–10.6)

## 2015-11-01 LAB — PROTIME-INR
INR: 1.89
Prothrombin Time: 21.6 seconds — ABNORMAL HIGH (ref 11.4–15.0)

## 2015-11-01 NOTE — Progress Notes (Signed)
Cardiology Office Note:  Date of Encounter: 11/01/2015  ID: Andres Gandy Sr., DOB 1941/05/29, MRN [id number]  PCP:  [name], MD Primary Cardiologist:  [name], MD  Chief Complaint  Patient presents with  . other    6 month follow up. Meds reviewed by the patient verbally. 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     He had cardiac catheterization done in April of 2013 which showed patent SVG to OM with severely reduced LV systolic function (EF of [id number]). He had an EP study done by Dr. Caryl Comes which showed induced ventricular tachycardia. He had an ICD placement done 02/2012. Back in April 2016 he was noting exertional dyspnea. Because of this he underwent nuclear stress testing that showed no ischemia. There was evidence of prior infarct with peri-infarct ischemia. EF <30%. This was a high risk study. He also underwent an echo that showed his EF had improved to 30-35%, diffuse HK, not technically sufficient to allow for diastolic function, mild MR, moderately dilated LA, mildly dilated RA, mild TR, PASP was normal. He was started on spironolactone 12.5 mg daily and contineud on Lasix 20 mg daily. He has continued to deal with exertional dyspnea causing him to have to take frequent breaks when he feels like to should not have to. When he gets to the top of a flight of stairs he is very winded. He does not feel like he is wheezing. His weight has been stable and he has not bee dealing with any lower extremity edema, orthopnea, PND, or increased coughing. His PCP has recently increased his Crestor to 20 mg every other day and 10 mg in between.  So far he is tolerating this dose.       Past Medical History  Diagnosis Date  . Sleep apnea     On CPAP  . Ischemic cardiomyopathy     Status post CABG in 1994; echo/Myoview 2011-12 ejection fraction 30-35%  . Ventricular tachycardia -inducible at EP testing     ppalpitations  . Polymyalgia rheumatica (HCC)     On steroids  . Chronic systolic congestive heart failure ([place])   . ICD (implantable cardiac defibrillator) in place   . Type II diabetes mellitus ([place])   . Nephrolithiasis 1970's  . Coronary artery disease   . Hyperlipidemia   . Atrial fibrillation, persistent-long-term   :  Past Surgical History  Procedure Laterality Date  . Coronary artery bypass graft  1994    [facility] ; CABG X1  . Cataract extraction w/ intraocular lens  implant, bilateral  2011  . Total knee arthroplasty  2011    left  . Joint replacement    . Knee arthroscopy  2012    right  . Insert / replace / remove pacemaker  02/28/12    "w/defibrillator"  . Cardioversion  03/01/2012    Procedure: CARDIOVERSION;  Surgeon: Deboraha Sprang, MD;  Location: [facility] OR;  Service: Cardiovascular;  Laterality: N/A;  . Cardiac catheterization  02/28/2012    Minor disease in LAD and RCA, LCX: 70% mid and occluded distally. Patent SVG to OM3  . Basel cell removal  left arm  . Electrophysiology study N/A 02/28/2012    Procedure: ELECTROPHYSIOLOGY STUDY;  Surgeon: Deboraha Sprang, MD;  Location: [facility] CATH LAB;  Service: Cardiovascular;  Laterality: N/A;  . Left heart catheterization with coronary/graft angiogram N/A 02/28/2012    Procedure: LEFT HEART CATHETERIZATION WITH [name];  Surgeon: [name], MD;  Location: [facility] CATH LAB;  Service: Cardiovascular;  Laterality: N/A;  :  Social History:  The patient  reports that he quit smoking about 27 years ago. His smoking use included Cigarettes. He has a 25 pack-year smoking history. He has never used smokeless tobacco. He reports that he does not drink alcohol or  use illicit drugs.   Family History  Problem Relation Age of Onset  . Heart attack Father   . Heart attack Brother      Allergies:  No Known Allergies   Home Medications:  Current Outpatient Prescriptions  Medication Sig Dispense Refill  . carvedilol (COREG) 3.125 MG tablet Take 1 tablet (3.125 mg total) by mouth 2 (two) times daily. 180 tablet 3  . Cinnamon 500 MG TABS Take 1,000 mg by mouth 2 (two) times daily.     . Coenzyme Q10 (CO Q 10 PO) Take 300 mg by mouth daily.    . furosemide (LASIX) 20 MG tablet Take 20 mg by mouth.    [name] GARLIC PO Take 1 capsule by mouth daily.     . Glucosamine-Chondroit-Vit C-Mn (GLUCOSAMINE CHONDR 1500 COMPLX PO) Take 1,500 mg by mouth 2 (two) times daily.     . insulin NPH Human (HUMULIN N,NOVOLIN N) 100 UNIT/ML injection Inject 75 Units into the skin at bedtime.    . insulin regular (NOVOLIN R,HUMULIN R) 100 units/mL injection Inject 30 Units into the skin 3 (three) times daily before meals.     . metFORMIN (GLUCOPHAGE) 500 MG tablet Take 500 mg by mouth daily with breakfast.     . methotrexate (RHEUMATREX) 2.5 MG tablet Take by mouth as directed. Caution:Chemotherapy. Protect from light.    . Multiple Vitamin (MULTIVITAMIN) capsule Take 1 capsule by mouth daily.    . Omega-3 Fatty Acids (FISH OIL PO) Take by mouth 2 (two) times daily.     . pioglitazone (ACTOS) 15 MG tablet Take 15 mg by mouth daily.    . ramipril (ALTACE) 5 MG capsule Take 1 capsule (5 mg total) by mouth daily. 30 capsule 6  . rivaroxaban (XARELTO) 20 MG TABS tablet Take 1 tablet (20 mg total) by mouth daily. 30 tablet 6  . rosuvastatin (CRESTOR) 10 MG tablet Take 10 mg by mouth daily.    [name] spironolactone (ALDACTONE) 25 MG tablet Take 0.5 tablets (12.5 mg total) by mouth daily. 30 tablet 3   No current facility-administered medications for this visit.     Review of Systems:  Review of Systems  Constitutional: Positive for malaise/fatigue. Negative for fever, chills,  weight loss and diaphoresis.  HENT: Negative for congestion.   Eyes: Negative for discharge and redness.  Respiratory: Positive for shortness of breath. Negative for cough, hemoptysis, sputum production and wheezing.   Cardiovascular: Negative for chest pain, palpitations, orthopnea, claudication, leg swelling and PND.  Gastrointestinal: Negative for nausea and vomiting.  Musculoskeletal: Negative for myalgias and falls.  Skin: Negative for rash.  Neurological: Positive for weakness. Negative for dizziness, sensory change, speech change, focal weakness and loss of consciousness.  Endo/Heme/Allergies: Does not bruise/bleed easily.  Psychiatric/Behavioral: The patient is not nervous/anxious.   All other systems reviewed and  are negative.    Physical Exam:  Blood pressure 118/70, pulse 79, height 5\' 10"  (1.778 m), weight 229 lb 12 oz (104.214 kg). BMI: Body mass index is 32.97 kg/(m^2). General: Pleasant, NAD. Psych: Normal affect. Responds to questions with normal affect.  Neuro: Alert and oriented X 3. Moves all extremities spontaneously. HEENT: Normocephalic, atraumatic. EOM intact. Sclera anicteric.  Neck: Trachea midline. Supple without bruits or JVD. Lungs:  Respirations regular and unlabored. CTA bilaterally without wheezing, crackles, or rhonchi.  Heart: irregularly-irregular, normal s3, s4. No murmurs, rubs, or gallops.  Abdomen: Soft, non-tender, non-distended, BS + x 4.  Extremities: No clubbing, cyanosis or edema. DP/PT/Radials 2+ and equal bilaterally.   Accessory Clinical Findings:  EKG: Afib with occasional PVCs, 79 bpm, low voltage, nonspecific lateral st/t changes   Recent Labs: 05/12/2015: BUN 21; Creatinine, Ser 1.45*; Potassium 5.1; Sodium 135  No results found for requested labs within last 365 days.  CrCl cannot be calculated (Patient has no serum creatinine result on file.).  Weights: Wt Readings from Last 3 Encounters:  11/01/15 229 lb 12 oz (104.214 kg)    03/29/15 224 lb 8 oz (101.833 kg)  03/02/15 230 lb 8 oz (104.554 kg)    Other studies Reviewed: Additional studies/ records that were reviewed today include: prior clinic notes.  Assessment & Plan:  1. CAD s/p CABG as above: -He continues to deal with exertional dyspnea as above -High risk nuclear stress test in May of 2016, though without ischemia -Schedule cardiac cath given his continuation of exertional dyspnea in the setting of lack of volume overload and no signs of a pulmonary component  -Continue Coreg 3.125 mg bid -On Xarelto 20 mg q supper in place of aspirin (most recent CrCl 65.67 mL/hr from bmet 05/12/2015) -He will need to hold Lasix, metformin, Actos, ramipril, Xarelto, and spironolactone all pre-cath -Risks and benefits of cardiac catheterization have been discussed with the patient including risks of bleeding, bruising, infection, kidney damage, stroke, heart attack, and death. The patient understands these risks and is willing to proceed with the procedure. All questions have been answered and concerns listened to.   2. Ischemic/nonischemic cardiomyopathy: -He does not appear to be volume overloaded on exam today. Lung are clear bilaterally, there is no LEE, and JVD is not elevated -Continue Coreg 3.125 mg bid, Lasix, 20 mg daily, ramipril 5 mg daily, and spironolactone 12.5 mg daily  3. Chronic Afib: -Currently rate controlled with heart rate in the 70's -Continue Coreg 3.125 mg bid -Continue Xarelto 20 mg q supper (CrCl 65.67 mL/hr) -CHADS2VASc at least 5 (CHF, HTN, age x 1, vascular disease)  4. History of inducible ventricular tachycardia s/p SJM ICD: -No evidence of ventricular arrhythmias noted on most recent remote device check 09/02/2015 -Followed by EP  5. HLD: -On Crestor 20 mg every other day now per PCP, tolerating thus far   Dispo: -Follow up post cardiac cath  Current medicines are reviewed at length with the patient today.  The patient did not  have any concerns regarding medicines.   Christell Faith, PA-C [address] [place], [place] [address] [phone] [place] 11/01/2015, 4:04 PM

## 2015-11-01 NOTE — Patient Instructions (Addendum)
Medication Instructions:  Your physician recommends that you continue on your current medications as directed. Please refer to the Current Medication list given to you today.   Labwork: BMET, CBC, PT/INR  Testing/Procedures: A chest x-ray takes a picture of the organs and structures inside the chest, including the heart, lungs, and blood vessels. This test can show several things, including, whether the heart is enlarges; whether fluid is building up in the lungs; and whether pacemaker / defibrillator leads are still in place.  Your physician has requested that you have a cardiac catheterization. Cardiac catheterization is used to diagnose and/or treat various heart conditions. Doctors may recommend this procedure for a number of different reasons. The most common reason is to evaluate chest pain. Chest pain can be a symptom of coronary artery disease (CAD), and cardiac catheterization can show whether plaque is narrowing or blocking your heart's arteries. This procedure is also used to evaluate the valves, as well as measure the blood flow and oxygen levels in different parts of your heart. For further information please visit [employer]tn. Please follow instruction sheet, as given.  [facility] Cardiac Cath Instructions   You are scheduled for a Cardiac Cath on: Monday, Dec 12, 7:30am  Please arrive at  6:30am on the day of your procedure  You will need to pre-register prior to the day of your procedure.  Enter through the [employer] at [facility].  Registration is the first desk on your right.  Please take the procedure order we have given you in order to be registered appropriately  Do not eat/drink anything after midnight  Someone will need to drive you home  It is recommended someone be with you for the first 24 hours after your procedure  Wear clothes that are easy to get on/off and wear slip on shoes if possible   Medications bring a current list of all medications with  you    xx___ Do not take these medications before your procedure: Hold xarelto 48 hours before procedure Hold actos and ramipril 24 hours before procedure Hold metformin 48 hours before and 48 hours after procedure Hold lasix and spironalactone the morning of your procedure     Day of your procedure: Arrive at the [place] entrance.  Free valet service is available.  After entering the [place] please check-in at the registration desk (1st desk on your right) to receive your armband. After receiving your armband someone will escort you to the cardiac cath/special procedures waiting area.  The usual length of stay after your procedure is about 2 to 3 hours.  This can vary.  If you have any questions, please call our office at [phone], or you may call the cardiac cath lab at [facility] directly at [phone]   Follow-Up: Your physician recommends that you schedule a follow-up appointment with [name] or Christell Faith, PA-C after cath   Any Other Special Instructions Will Be Listed Below (If Applicable).     If you need a refill on your cardiac medications before your next appointment, please call your pharmacy.  Angiogram An angiogram, also called angiography, is a procedure used to look at the blood vessels. In this procedure, dye is injected through a long, thin tube (catheter) into an artery. X-rays are then taken. The X-rays will show if there is a blockage or problem in a blood vessel.  LET [facility] CARE PROVIDER KNOW ABOUT:  Any allergies you have, including allergies to shellfish or contrast dye.   All medicines  you are taking, including vitamins, herbs, eye drops, creams, and over-the-counter medicines.   Previous problems you or members of your family have had with the use of anesthetics.   Any blood disorders you have.   Previous surgeries you have had.  Any previous kidney problems or failure you have had.  Medical conditions you have.    Possibility of pregnancy, if this applies. RISKS AND COMPLICATIONS Generally, an angiogram is a safe procedure. However, as with any procedure, problems can occur. Possible problems include:  Injury to the blood vessels, including rupture or bleeding.  Infection or bruising at the catheter site.  Allergic reaction to the dye or contrast used.  Kidney damage from the dye or contrast used.  Blood clots that can lead to a stroke or heart attack. BEFORE THE PROCEDURE  Do not eat or drink after midnight on the night before the procedure, or as directed by your health care provider.   Ask your health care provider if you may drink enough water to take any needed medicines the morning of the procedure.  PROCEDURE  You may be given a medicine to help you relax (sedative) before and during the procedure. This medicine is given through an IV access tube that is inserted into one of your veins.   The area where the catheter will be inserted will be washed and shaved. This is usually done in the groin but may be done in the fold of your arm (near your elbow) or in the wrist.  A medicine will be given to numb the area where the catheter will be inserted (local anesthetic).  The catheter will be inserted with a guide wire into an artery. The catheter is guided by using a type of X-ray (fluoroscopy) to the blood vessel being examined.   Dye is then injected into the catheter, and X-rays are taken. The dye helps to show where any narrowing or blockages are located.  AFTER THE PROCEDURE   If the procedure is done through the leg, you will be kept in bed lying flat for several hours. You will be instructed to not bend or cross your legs.  The insertion site will be checked frequently.  The pulse in your feet or wrist will be checked frequently.  Additional blood tests, X-rays, and electrocardiography may be done.   You may need to stay in the hospital overnight for observation.     This information is not intended to replace advice given to you by your health care provider. Make sure you discuss any questions you have with your health care provider.   Document Released: 08/23/2005 Document Revised: 12/04/2014 Document Reviewed: 04/16/2013 Elsevier Interactive Patient Education 2016 [place] After Refer to this sheet in the next few weeks. These instructions provide you with information about caring for yourself after your procedure. Your health care provider may also give you more specific instructions. Your treatment has been planned according to current medical practices, but problems sometimes occur. Call your health care provider if you have any problems or questions after your procedure. WHAT TO EXPECT AFTER THE PROCEDURE After your procedure, it is typical to have the following:  Bruising at the catheter insertion site that usually fades within 1-2 weeks.  Blood collecting in the tissue (hematoma) that may be painful to the touch. It should usually decrease in size and tenderness within 1-2 weeks. HOME CARE INSTRUCTIONS  Take medicines only as directed by your health care provider.  You may shower 24-48  hours after the procedure or as directed by your health care provider. Remove the bandage (dressing) and gently wash the site with plain soap and water. Pat the area dry with a clean towel. Do not rub the site, because this may cause bleeding.  Do not take baths, swim, or use a hot tub until your health care provider approves.  Check your insertion site every day for redness, swelling, or drainage.  Do not apply powder or lotion to the site.  Do not lift over 10 lb (4.5 kg) for 5 days after your procedure or as directed by your health care provider.  Ask your health care provider when it is okay to:  Return to work or school.  Resume usual physical activities or sports.  Resume sexual activity.  Do not drive home if you are  discharged the same day as the procedure. Have someone else drive you.  You may drive 24 hours after the procedure unless otherwise instructed by your health care provider.  Do not operate machinery or power tools for 24 hours after the procedure or as directed by your health care provider.  If your procedure was done as an outpatient procedure, which means that you went home the same day as your procedure, a responsible adult should be with you for the first 24 hours after you arrive home.  Keep all follow-up visits as directed by your health care provider. This is important. SEEK MEDICAL CARE IF:  You have a fever.  You have chills.  You have increased bleeding from the catheter insertion site. Hold pressure on the site. SEEK IMMEDIATE MEDICAL CARE IF:  You have unusual pain at the catheter insertion site.  You have redness, warmth, or swelling at the catheter insertion site.  You have drainage (other than a small amount of blood on the dressing) from the catheter insertion site.  The catheter insertion site is bleeding, and the bleeding does not stop after 30 minutes of holding steady pressure on the site.  The area near or just beyond the catheter insertion site becomes pale, cool, tingly, or numb.   This information is not intended to replace advice given to you by your health care provider. Make sure you discuss any questions you have with your health care provider.   Document Released: 06/01/2005 Document Revised: 12/04/2014 Document Reviewed: 04/16/2013 Elsevier Interactive Patient Education [employer].

## 2015-11-05 ENCOUNTER — Telehealth: Payer: Self-pay | Admitting: *Deleted

## 2015-11-05 NOTE — Telephone Encounter (Signed)
Received call from cath lab. Time for 12/12 cath needs to be changed. [name] agreeable to 11:30am cath. S/w wife regarding new time and to arrive at 10:30am. Reviewed cath instructions including medications to hold. She states ramipril recently d/c'd and enalapril added. Per [name], have pt hold enalapril before procedure. Wife verbalized understanding and is agreeable w/plan

## 2015-11-05 NOTE — Telephone Encounter (Signed)
Please call patient's wife as they have questions regarding his cath on Monday and his medications.

## 2015-11-08 ENCOUNTER — Encounter
Admission: RE | Disposition: A | Discharge status: Home / Self Care | Payer: Self-pay | Source: Ambulatory Visit | Attending: Cardiovascular Disease

## 2015-11-08 ENCOUNTER — Encounter: Payer: Self-pay | Admitting: *Deleted

## 2015-11-08 ENCOUNTER — Ambulatory Visit
Admission: RE | Admit: 2015-11-08 | Discharge: 2015-11-08 | Disposition: A | Discharge status: Home / Self Care | Payer: Medicare Other | Source: Ambulatory Visit | Attending: Cardiovascular Disease | Admitting: Cardiovascular Disease

## 2015-11-08 DIAGNOSIS — E785 Hyperlipidemia, unspecified: Secondary | ICD-10-CM | POA: Insufficient documentation

## 2015-11-08 DIAGNOSIS — I5022 Chronic systolic (congestive) heart failure: Secondary | ICD-10-CM | POA: Diagnosis not present

## 2015-11-08 DIAGNOSIS — I482 Chronic atrial fibrillation: Secondary | ICD-10-CM | POA: Insufficient documentation

## 2015-11-08 DIAGNOSIS — E119 Type 2 diabetes mellitus without complications: Secondary | ICD-10-CM | POA: Insufficient documentation

## 2015-11-08 DIAGNOSIS — Z950 Presence of cardiac pacemaker: Secondary | ICD-10-CM | POA: Diagnosis not present

## 2015-11-08 DIAGNOSIS — I2510 Atherosclerotic heart disease of native coronary artery without angina pectoris: Secondary | ICD-10-CM | POA: Diagnosis present

## 2015-11-08 DIAGNOSIS — I472 Ventricular tachycardia: Secondary | ICD-10-CM | POA: Insufficient documentation

## 2015-11-08 DIAGNOSIS — R0600 Dyspnea, unspecified: Secondary | ICD-10-CM | POA: Diagnosis not present

## 2015-11-08 DIAGNOSIS — Z8249 Family history of ischemic heart disease and other diseases of the circulatory system: Secondary | ICD-10-CM | POA: Insufficient documentation

## 2015-11-08 DIAGNOSIS — Z95810 Presence of automatic (implantable) cardiac defibrillator: Secondary | ICD-10-CM | POA: Insufficient documentation

## 2015-11-08 DIAGNOSIS — Z794 Long term (current) use of insulin: Secondary | ICD-10-CM | POA: Insufficient documentation

## 2015-11-08 DIAGNOSIS — Z87442 Personal history of urinary calculi: Secondary | ICD-10-CM | POA: Diagnosis not present

## 2015-11-08 DIAGNOSIS — I25118 Atherosclerotic heart disease of native coronary artery with other forms of angina pectoris: Secondary | ICD-10-CM | POA: Diagnosis not present

## 2015-11-08 DIAGNOSIS — I25709 Atherosclerosis of coronary artery bypass graft(s), unspecified, with unspecified angina pectoris: Secondary | ICD-10-CM | POA: Diagnosis not present

## 2015-11-08 DIAGNOSIS — M353 Polymyalgia rheumatica: Secondary | ICD-10-CM | POA: Insufficient documentation

## 2015-11-08 DIAGNOSIS — Z7901 Long term (current) use of anticoagulants: Secondary | ICD-10-CM | POA: Diagnosis not present

## 2015-11-08 DIAGNOSIS — I25119 Atherosclerotic heart disease of native coronary artery with unspecified angina pectoris: Secondary | ICD-10-CM | POA: Diagnosis not present

## 2015-11-08 DIAGNOSIS — Z79899 Other long term (current) drug therapy: Secondary | ICD-10-CM | POA: Diagnosis not present

## 2015-11-08 DIAGNOSIS — Z96652 Presence of left artificial knee joint: Secondary | ICD-10-CM | POA: Insufficient documentation

## 2015-11-08 DIAGNOSIS — I10 Essential (primary) hypertension: Secondary | ICD-10-CM | POA: Diagnosis not present

## 2015-11-08 DIAGNOSIS — Z87891 Personal history of nicotine dependence: Secondary | ICD-10-CM | POA: Diagnosis not present

## 2015-11-08 DIAGNOSIS — I481 Persistent atrial fibrillation: Secondary | ICD-10-CM | POA: Diagnosis not present

## 2015-11-08 DIAGNOSIS — I255 Ischemic cardiomyopathy: Secondary | ICD-10-CM | POA: Insufficient documentation

## 2015-11-08 DIAGNOSIS — G4733 Obstructive sleep apnea (adult) (pediatric): Secondary | ICD-10-CM | POA: Insufficient documentation

## 2015-11-08 HISTORY — PX: CARDIAC CATHETERIZATION: SHX172

## 2015-11-08 HISTORY — DX: Unspecified osteoarthritis, unspecified site: M19.90

## 2015-11-08 LAB — GLUCOSE, CAPILLARY: Glucose-Capillary: 184 mg/dL — ABNORMAL HIGH (ref 65–99)

## 2015-11-08 SURGERY — LEFT HEART CATH AND CORS/GRAFTS ANGIOGRAPHY

## 2015-11-08 MED ORDER — IOHEXOL 300 MG/ML  SOLN
INTRAMUSCULAR | Status: DC | PRN
  Administered 2015-11-08: 85 mL via INTRAVENOUS

## 2015-11-08 MED ORDER — MIDAZOLAM HCL 2 MG/2ML IJ SOLN
INTRAMUSCULAR | Status: DC | PRN
  Administered 2015-11-08: 0.5 mg via INTRAVENOUS
  Administered 2015-11-08: 1 mg via INTRAVENOUS

## 2015-11-08 MED ORDER — FENTANYL CITRATE (PF) 100 MCG/2ML IJ SOLN
INTRAMUSCULAR | Status: DC | PRN
  Administered 2015-11-08: 50 ug via INTRAVENOUS
  Administered 2015-11-08: 25 ug via INTRAVENOUS

## 2015-11-08 MED ORDER — FENTANYL CITRATE (PF) 100 MCG/2ML IJ SOLN
INTRAMUSCULAR | Status: AC
  Filled 2015-11-08: qty 2

## 2015-11-08 MED ORDER — HEPARIN SODIUM (PORCINE) 1000 UNIT/ML IJ SOLN
INTRAMUSCULAR | Status: DC | PRN
  Administered 2015-11-08: 5000 [IU] via INTRAVENOUS

## 2015-11-08 MED ORDER — SODIUM CHLORIDE 0.9 % IV SOLN: INTRAVENOUS | Status: DC

## 2015-11-08 MED ORDER — SODIUM CHLORIDE 0.9 % IJ SOLN: 3.0000 mL | INTRAMUSCULAR | Status: DC | PRN

## 2015-11-08 MED ORDER — SODIUM CHLORIDE 0.9 % IJ SOLN: 3.0000 mL | Freq: Two times a day (BID) | INTRAMUSCULAR | Status: DC

## 2015-11-08 MED ORDER — LIDOCAINE HCL (PF) 1 % IJ SOLN
INTRAMUSCULAR | Status: DC | PRN
  Administered 2015-11-08: 12:00:00

## 2015-11-08 MED ORDER — VERAPAMIL HCL 2.5 MG/ML IV SOLN
INTRAVENOUS | Status: DC | PRN
  Administered 2015-11-08: 2.5 mg via INTRA_ARTERIAL

## 2015-11-08 MED ORDER — MIDAZOLAM HCL 2 MG/2ML IJ SOLN
INTRAMUSCULAR | Status: AC
  Filled 2015-11-08: qty 2

## 2015-11-08 MED ORDER — HEPARIN SODIUM (PORCINE) 1000 UNIT/ML IJ SOLN
INTRAMUSCULAR | Status: AC
  Filled 2015-11-08: qty 1

## 2015-11-08 MED ORDER — SODIUM CHLORIDE 0.9 % IV SOLN: 250.0000 mL | INTRAVENOUS | Status: DC | PRN

## 2015-11-08 MED ORDER — SODIUM CHLORIDE 0.9 % IV SOLN
INTRAVENOUS | Status: DC
  Administered 2015-11-08: 11:00:00 via INTRAVENOUS

## 2015-11-08 MED ORDER — HEPARIN (PORCINE) IN NACL 2-0.9 UNIT/ML-% IJ SOLN
INTRAMUSCULAR | Status: AC
  Filled 2015-11-08: qty 1000

## 2015-11-08 MED ORDER — SODIUM CHLORIDE 0.9 % IJ SOLN
3.0000 mL | Freq: Three times a day (TID) | INTRAMUSCULAR | Status: DC
  Administered 2015-11-08: 3 mL via INTRAVENOUS

## 2015-11-08 MED ORDER — VERAPAMIL HCL 2.5 MG/ML IV SOLN
INTRAVENOUS | Status: AC
  Filled 2015-11-08: qty 2

## 2015-11-08 SURGICAL SUPPLY — 9 items
CATH INFINITI 5FR ANG PIGTAIL (CATHETERS) ×3 IMPLANT
CATH OPTITORQUE JACKY 4.0 5F (CATHETERS) ×3 IMPLANT
DEVICE RAD COMP TR BAND LRG (VASCULAR PRODUCTS) ×3 IMPLANT
DEVICE RAD TR BAND REGULAR (VASCULAR PRODUCTS) IMPLANT
GLIDESHEATH SLEND SS 6F .021 (SHEATH) ×3 IMPLANT
KIT MANI 3VAL PERCEP (MISCELLANEOUS) ×3 IMPLANT
PACK CARDIAC CATH (CUSTOM PROCEDURE TRAY) ×3 IMPLANT
WIRE RUNTHROUGH .014X180CM (WIRE) ×3 IMPLANT
WIRE SAFE-T 1.5MM-J .035X260CM (WIRE) ×3 IMPLANT

## 2015-11-08 NOTE — H&P (View-Only) (Signed)
Cardiology Office Note:  Date of Encounter: 11/01/2015  ID: Andres Gandy Sr., DOB February 19, 1941, MRN [id number]  PCP:  [name], MD Primary Cardiologist:  [name], MD  Chief Complaint  Patient presents with  . other    6 month follow up. Meds reviewed by the patient verbally. 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     He had cardiac catheterization done in April of 2013 which showed patent SVG to OM with severely reduced LV systolic function (EF of [id number]). He had an EP study done by Dr. Caryl Comes which showed induced ventricular tachycardia. He had an ICD placement done 02/2012. Back in April 2016 he was noting exertional dyspnea. Because of this he underwent nuclear stress testing that showed no ischemia. There was evidence of prior infarct with peri-infarct ischemia. EF <30%. This was a high risk study. He also underwent an echo that showed his EF had improved to 30-35%, diffuse HK, not technically sufficient to allow for diastolic function, mild MR, moderately dilated LA, mildly dilated RA, mild TR, PASP was normal. He was started on spironolactone 12.5 mg daily and contineud on Lasix 20 mg daily. He has continued to deal with exertional dyspnea causing him to have to take frequent breaks when he feels like to should not have to. When he gets to the top of a flight of stairs he is very winded. He does not feel like he is wheezing. His weight has been stable and he has not bee dealing with any lower extremity edema, orthopnea, PND, or increased coughing. His PCP has recently increased his Crestor to 20 mg every other day and 10 mg in between.  So far he is tolerating this dose.       Past Medical History  Diagnosis Date  . Sleep apnea     On CPAP  . Ischemic cardiomyopathy     Status post CABG in 1994; echo/Myoview 2011-12 ejection fraction 30-35%  . Ventricular tachycardia -inducible at EP testing     ppalpitations  . Polymyalgia rheumatica (HCC)     On steroids  . Chronic systolic congestive heart failure ([place])   . ICD (implantable cardiac defibrillator) in place   . Type II diabetes mellitus ([place])   . Nephrolithiasis 1970's  . Coronary artery disease   . Hyperlipidemia   . Atrial fibrillation, persistent-long-term   :  Past Surgical History  Procedure Laterality Date  . Coronary artery bypass graft  1994    [facility] ; CABG X1  . Cataract extraction w/ intraocular lens  implant, bilateral  2011  . Total knee arthroplasty  2011    left  . Joint replacement    . Knee arthroscopy  2012    right  . Insert / replace / remove pacemaker  02/28/12    "w/defibrillator"  . Cardioversion  03/01/2012    Procedure: CARDIOVERSION;  Surgeon: Deboraha Sprang, MD;  Location: [facility] OR;  Service: Cardiovascular;  Laterality: N/A;  . Cardiac catheterization  02/28/2012    Minor disease in LAD and RCA, LCX: 70% mid and occluded distally. Patent SVG to OM3  . Basel cell removal  left arm  . Electrophysiology study N/A 02/28/2012    Procedure: ELECTROPHYSIOLOGY STUDY;  Surgeon: Deboraha Sprang, MD;  Location: [facility] CATH LAB;  Service: Cardiovascular;  Laterality: N/A;  . Left heart catheterization with coronary/graft angiogram N/A 02/28/2012    Procedure: LEFT HEART CATHETERIZATION WITH [name];  Surgeon: [name], MD;  Location: [facility] CATH LAB;  Service: Cardiovascular;  Laterality: N/A;  :  Social History:  The patient  reports that he quit smoking about 27 years ago. His smoking use included Cigarettes. He has a 25 pack-year smoking history. He has never used smokeless tobacco. He reports that he does not drink alcohol or  use illicit drugs.   Family History  Problem Relation Age of Onset  . Heart attack Father   . Heart attack Brother      Allergies:  No Known Allergies   Home Medications:  Current Outpatient Prescriptions  Medication Sig Dispense Refill  . carvedilol (COREG) 3.125 MG tablet Take 1 tablet (3.125 mg total) by mouth 2 (two) times daily. 180 tablet 3  . Cinnamon 500 MG TABS Take 1,000 mg by mouth 2 (two) times daily.     . Coenzyme Q10 (CO Q 10 PO) Take 300 mg by mouth daily.    . furosemide (LASIX) 20 MG tablet Take 20 mg by mouth.    [name] GARLIC PO Take 1 capsule by mouth daily.     . Glucosamine-Chondroit-Vit C-Mn (GLUCOSAMINE CHONDR 1500 COMPLX PO) Take 1,500 mg by mouth 2 (two) times daily.     . insulin NPH Human (HUMULIN N,NOVOLIN N) 100 UNIT/ML injection Inject 75 Units into the skin at bedtime.    . insulin regular (NOVOLIN R,HUMULIN R) 100 units/mL injection Inject 30 Units into the skin 3 (three) times daily before meals.     . metFORMIN (GLUCOPHAGE) 500 MG tablet Take 500 mg by mouth daily with breakfast.     . methotrexate (RHEUMATREX) 2.5 MG tablet Take by mouth as directed. Caution:Chemotherapy. Protect from light.    . Multiple Vitamin (MULTIVITAMIN) capsule Take 1 capsule by mouth daily.    . Omega-3 Fatty Acids (FISH OIL PO) Take by mouth 2 (two) times daily.     . pioglitazone (ACTOS) 15 MG tablet Take 15 mg by mouth daily.    . ramipril (ALTACE) 5 MG capsule Take 1 capsule (5 mg total) by mouth daily. 30 capsule 6  . rivaroxaban (XARELTO) 20 MG TABS tablet Take 1 tablet (20 mg total) by mouth daily. 30 tablet 6  . rosuvastatin (CRESTOR) 10 MG tablet Take 10 mg by mouth daily.    [name] spironolactone (ALDACTONE) 25 MG tablet Take 0.5 tablets (12.5 mg total) by mouth daily. 30 tablet 3   No current facility-administered medications for this visit.     Review of Systems:  Review of Systems  Constitutional: Positive for malaise/fatigue. Negative for fever, chills,  weight loss and diaphoresis.  HENT: Negative for congestion.   Eyes: Negative for discharge and redness.  Respiratory: Positive for shortness of breath. Negative for cough, hemoptysis, sputum production and wheezing.   Cardiovascular: Negative for chest pain, palpitations, orthopnea, claudication, leg swelling and PND.  Gastrointestinal: Negative for nausea and vomiting.  Musculoskeletal: Negative for myalgias and falls.  Skin: Negative for rash.  Neurological: Positive for weakness. Negative for dizziness, sensory change, speech change, focal weakness and loss of consciousness.  Endo/Heme/Allergies: Does not bruise/bleed easily.  Psychiatric/Behavioral: The patient is not nervous/anxious.   All other systems reviewed and  are negative.    Physical Exam:  Blood pressure 118/70, pulse 79, height 5\' 10"  (1.778 m), weight 229 lb 12 oz (104.214 kg). BMI: Body mass index is 32.97 kg/(m^2). General: Pleasant, NAD. Psych: Normal affect. Responds to questions with normal affect.  Neuro: Alert and oriented X 3. Moves all extremities spontaneously. HEENT: Normocephalic, atraumatic. EOM intact. Sclera anicteric.  Neck: Trachea midline. Supple without bruits or JVD. Lungs:  Respirations regular and unlabored. CTA bilaterally without wheezing, crackles, or rhonchi.  Heart: irregularly-irregular, normal s3, s4. No murmurs, rubs, or gallops.  Abdomen: Soft, non-tender, non-distended, BS + x 4.  Extremities: No clubbing, cyanosis or edema. DP/PT/Radials 2+ and equal bilaterally.   Accessory Clinical Findings:  EKG: Afib with occasional PVCs, 79 bpm, low voltage, nonspecific lateral st/t changes   Recent Labs: 05/12/2015: BUN 21; Creatinine, Ser 1.45*; Potassium 5.1; Sodium 135  No results found for requested labs within last 365 days.  CrCl cannot be calculated (Patient has no serum creatinine result on file.).  Weights: Wt Readings from Last 3 Encounters:  11/01/15 229 lb 12 oz (104.214 kg)    03/29/15 224 lb 8 oz (101.833 kg)  03/02/15 230 lb 8 oz (104.554 kg)    Other studies Reviewed: Additional studies/ records that were reviewed today include: prior clinic notes.  Assessment & Plan:  1. CAD s/p CABG as above: -He continues to deal with exertional dyspnea as above -High risk nuclear stress test in May of 2016, though without ischemia -Schedule cardiac cath given his continuation of exertional dyspnea in the setting of lack of volume overload and no signs of a pulmonary component  -Continue Coreg 3.125 mg bid -On Xarelto 20 mg q supper in place of aspirin (most recent CrCl 65.67 mL/hr from bmet 05/12/2015) -He will need to hold Lasix, metformin, Actos, ramipril, Xarelto, and spironolactone all pre-cath -Risks and benefits of cardiac catheterization have been discussed with the patient including risks of bleeding, bruising, infection, kidney damage, stroke, heart attack, and death. The patient understands these risks and is willing to proceed with the procedure. All questions have been answered and concerns listened to.   2. Ischemic/nonischemic cardiomyopathy: -He does not appear to be volume overloaded on exam today. Lung are clear bilaterally, there is no LEE, and JVD is not elevated -Continue Coreg 3.125 mg bid, Lasix, 20 mg daily, ramipril 5 mg daily, and spironolactone 12.5 mg daily  3. Chronic Afib: -Currently rate controlled with heart rate in the 70's -Continue Coreg 3.125 mg bid -Continue Xarelto 20 mg q supper (CrCl 65.67 mL/hr) -CHADS2VASc at least 5 (CHF, HTN, age x 1, vascular disease)  4. History of inducible ventricular tachycardia s/p SJM ICD: -No evidence of ventricular arrhythmias noted on most recent remote device check 09/02/2015 -Followed by EP  5. HLD: -On Crestor 20 mg every other day now per PCP, tolerating thus far   Dispo: -Follow up post cardiac cath  Current medicines are reviewed at length with the patient today.  The patient did not  have any concerns regarding medicines.   Christell Faith, PA-C [address] [address] [place] [address] [place], [place] [address] [phone] [place] 11/01/2015, 4:04 PM

## 2015-11-08 NOTE — Interval H&P Note (Signed)
History and Physical Interval Note:  11/08/2015 11:56 AM  Andres Judith Part Sr.  has presented today for surgery, with the diagnosis of shortness of breath  Left heart cath  The various methods of treatment have been discussed with the patient and family. After consideration of risks, benefits and other options for treatment, the patient has consented to  Procedure(s): Left Heart Cath and Coronary Angiography (Left) as a surgical intervention .  The patient's history has been reviewed, patient examined, no change in status, stable for surgery.  I have reviewed the patient's chart and labs.  Questions were answered to the patient's satisfaction.     Kathlyn [place]

## 2015-11-08 NOTE — OR Nursing (Signed)
Initial BP low  Recheck 107/63 Pt asym. With BP.

## 2015-11-08 NOTE — Discharge Instructions (Signed)
Resume Metformin in 2 days.  Resume Xarelto tomorrow. Keep right wrist elevated on pillow above the heart for today.  Watch right wrist for evidence of bleeding or hematoma.. If bleeding or hematoma noted, hold pressure over the site for at least 15 minutes and notify the physician.  No blending or flexing of the wrist--no lifting for the remainder of the day or for 2 weeks after your procedure. Notify the physician for evidence of infection or if you get a temperature.

## 2015-12-02 ENCOUNTER — Ambulatory Visit (INDEPENDENT_AMBULATORY_CARE_PROVIDER_SITE_OTHER): Payer: Medicare Other | Admitting: *Deleted

## 2015-12-02 DIAGNOSIS — I5022 Chronic systolic (congestive) heart failure: Secondary | ICD-10-CM | POA: Diagnosis not present

## 2015-12-02 DIAGNOSIS — I255 Ischemic cardiomyopathy: Secondary | ICD-10-CM | POA: Diagnosis not present

## 2015-12-03 NOTE — Progress Notes (Signed)
Remote ICD transmission.   

## 2015-12-10 ENCOUNTER — Ambulatory Visit (INDEPENDENT_AMBULATORY_CARE_PROVIDER_SITE_OTHER): Payer: Medicare Other | Admitting: Nurse Practitioner

## 2015-12-10 ENCOUNTER — Encounter: Payer: Self-pay | Admitting: Nurse Practitioner

## 2015-12-10 VITALS — BP 120/90 | HR 64 | Ht 70.0 in | Wt 230.4 lb

## 2015-12-10 DIAGNOSIS — I5022 Chronic systolic (congestive) heart failure: Secondary | ICD-10-CM

## 2015-12-10 DIAGNOSIS — I429 Cardiomyopathy, unspecified: Secondary | ICD-10-CM | POA: Diagnosis not present

## 2015-12-10 DIAGNOSIS — E785 Hyperlipidemia, unspecified: Secondary | ICD-10-CM

## 2015-12-10 DIAGNOSIS — I4821 Permanent atrial fibrillation: Secondary | ICD-10-CM

## 2015-12-10 DIAGNOSIS — I482 Chronic atrial fibrillation: Secondary | ICD-10-CM

## 2015-12-10 DIAGNOSIS — I25709 Atherosclerosis of coronary artery bypass graft(s), unspecified, with unspecified angina pectoris: Secondary | ICD-10-CM

## 2015-12-10 DIAGNOSIS — I110 Hypertensive heart disease with heart failure: Secondary | ICD-10-CM

## 2015-12-10 DIAGNOSIS — I428 Other cardiomyopathies: Secondary | ICD-10-CM

## 2015-12-10 MED ORDER — ISOSORBIDE MONONITRATE ER 30 MG PO TB24: 15.0000 mg | ORAL_TABLET | Freq: Every day | ORAL | Status: DC

## 2015-12-10 NOTE — Patient Instructions (Addendum)
Medication Instructions:  Please start Imdur 15 mg once daily (1/2 pill)  Labwork: None  Testing/Procedures: None  Follow-Up: 3 months w/ [name]  If you need a refill on your cardiac medications before your next appointment, please call your pharmacy.  Isosorbide Mononitrate extended-release tablets What is this medicine? ISOSORBIDE MONONITRATE (eye soe SOR bide mon oh NYE trate) is a vasodilator. It relaxes blood vessels, increasing the blood and oxygen supply to your heart. This medicine is used to prevent chest pain caused by angina. It will not help to stop an episode of chest pain. This medicine may be used for other purposes; ask your health care provider or pharmacist if you have questions. What should I tell my health care provider before I take this medicine? They need to know if you have any of these conditions: -previous heart attack or heart failure -an unusual or allergic reaction to isosorbide mononitrate, nitrates, other medicines, foods, dyes, or preservatives -pregnant or trying to get pregnant -breast-feeding How should I use this medicine? Take this medicine by mouth with a glass of water. Follow the directions on the prescription label. Do not crush or chew. Take your medicine at regular intervals. Do not take your medicine more often than directed. Do not stop taking this medicine except on the advice of your doctor or health care professional. Talk to your pediatrician regarding the use of this medicine in children. Special care may be needed. Overdosage: If you think you have taken too much of this medicine contact a poison control center or emergency room at once. NOTE: This medicine is only for you. Do not share this medicine with others. What if I miss a dose? If you miss a dose, take it as soon as you can. If it is almost time for your next dose, take only that dose. Do not take double or extra doses. What may interact with this medicine? Do not take this  medicine with any of the following medications: -medicines used to treat erectile dysfunction (ED) like avanafil, sildenafil, tadalafil, and vardenafil -riociguat This medicine may also interact with the following medications: -medicines for high blood pressure -other medicines for angina or heart failure This list may not describe all possible interactions. Give your health care provider a list of all the medicines, herbs, non-prescription drugs, or dietary supplements you use. Also tell them if you smoke, drink alcohol, or use illegal drugs. Some items may interact with your medicine. What should I watch for while using this medicine? Check your heart rate and blood pressure regularly while you are taking this medicine. Ask your doctor or health care professional what your heart rate and blood pressure should be and when you should contact him or her. Tell your doctor or health care professional if you feel your medicine is no longer working. You may get dizzy. Do not drive, use machinery, or do anything that needs mental alertness until you know how this medicine affects you. To reduce the risk of dizzy or fainting spells, do not sit or stand up quickly, especially if you are an older patient. Alcohol can make you more dizzy, and increase flushing and rapid heartbeats. Avoid alcoholic drinks. Do not treat yourself for coughs, colds, or pain while you are taking this medicine without asking your doctor or health care professional for advice. Some ingredients may increase your blood pressure. What side effects may I notice from receiving this medicine? Side effects that you should report to your doctor or health care  professional as soon as possible: -bluish discoloration of lips, fingernails, or palms of hands -irregular heartbeat, palpitations -low blood pressure -nausea, vomiting -persistent headache -unusually weak or tired Side effects that usually do not require medical attention (report to  your doctor or health care professional if they continue or are bothersome): -flushing of the face or neck -rash This list may not describe all possible side effects. Call your doctor for medical advice about side effects. You may report side effects to FDA at 1-800-FDA-1088. Where should I keep my medicine? Keep out of the reach of children. Store between 15 and 30 degrees C (59 and 86 degrees F). Keep container tightly closed. Throw away any unused medicine after the expiration date. NOTE: This sheet is a summary. It may not cover all possible information. If you have questions about this medicine, talk to your doctor, pharmacist, or health care provider.    2016, [employer] Standard. (2013-09-12 14:48:19)

## 2015-12-10 NOTE — Progress Notes (Signed)
Office Visit    Patient Name: Andres GILLELAND Sr. Date of Encounter: 12/10/2015  Primary Care Provider:  [name], MD Primary Cardiologist:  Jerilynn Mages. Fletcher Anon, MD / S. 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  Past Medical History    Past Medical History  Diagnosis Date  . Sleep apnea     On CPAP  . Mixed Ischemic and Non-ischemic Cardiomyopathy     a. 07/2012 s/p SJM AICD (RV lead only);  b. 03/2015 Echo: EF 30-35%, diff HK;  c. EF 25% by LV gram.  . Ventricular tachycardia -inducible at EP testing     a. 07/2012 s/ SJM AICD.  [name] Polymyalgia rheumatica (HCC)     On steroids  . Chronic systolic congestive heart failure ([place])     a. 03/2015 Echo: EF 30-35%, diff HK, mild MR, mod dil LA, mildly dil RA, mild TR.  . ICD (implantable cardiac defibrillator) in place     a. 02/2012 s/p SJM 1257-40Q Fortify Assurance single lead AICD.  [name] Type II diabetes mellitus ([place])   . Nephrolithiasis 1970's  . Coronary artery disease     a. 1994 s/p CABG x 1 (VG->OM3);  b. 10/2015 Cath: LM nl, LAD min irregs, D1 nl, D2 min irregs, LCX 59m, OM2 nl, OM3 90 (small territory), VG->OM3 100, RCA min irregs, RPDA/RPL1/RPL2/RPL3 nl, EF 25%-->Med Rx.  [name] Hyperlipidemia   . Atrial fibrillation, persistent-long-term   . Arthritis    Past Surgical History  Procedure Laterality Date  . Coronary artery bypass graft  1994    [facility] ; CABG X1  . Cataract extraction w/ intraocular lens  implant, bilateral  2011  . Total knee arthroplasty  2011    left  . Joint replacement    . Knee arthroscopy  2012    right  . Insert / replace / remove pacemaker  02/28/12    "w/defibrillator"  . Cardioversion  03/01/2012    Procedure: CARDIOVERSION;  Surgeon: Deboraha Sprang, MD;  Location: [facility] OR;  Service: Cardiovascular;  Laterality: N/A;  . Cardiac  catheterization  02/28/2012    Minor disease in LAD and RCA, LCX: 70% mid and occluded distally. Patent SVG to OM3  . Basel cell removal      left arm  . Electrophysiology study N/A 02/28/2012    Procedure: ELECTROPHYSIOLOGY STUDY;  Surgeon: Deboraha Sprang, MD;  Location: [facility] CATH LAB;  Service: Cardiovascular;  Laterality: N/A;  . Left heart catheterization with coronary/graft angiogram N/A 02/28/2012    Procedure: LEFT HEART CATHETERIZATION WITH [name];  Surgeon: [name], MD;  Location: [facility] CATH LAB;  Service: Cardiovascular;  Laterality: N/A;  . Cardiac catheterization N/A 11/08/2015    Procedure: Left Heart Cath and Cors/Grafts Angiography;  Surgeon: [name], MD;  Location: [place] CV LAB;  Service: Cardiovascular;  Laterality: N/A;    Allergies  No Known Allergies  History of Present Illness     75 year old male with the above complex past medical history. He is status post remote coronary artery bypass grafting 1 in 1994. He also has a history of mixed ischemic and nonischemic myopathy as well as ventricular tachycardia and is status post ICD placement in September 2013. He has been expressing dyspnea on exertion and was evaluated in clinic in December. Decision was  made to pursue diagnostic catheterization, especially in light of a high risk stress test over the summer. Catheterization revealed an occlusion of the vein graft to the OM 3 with native distal left circumflex and OM 3 disease. The LAD and right coronary artery were widely patent. Medical therapy was felt to be warranted as the OM 3 territory was small in nature. Since his catheterization, he is continued to have dyspnea exertion though denies angina. He further denies PND, orthopnea, dizziness, syncope, edema, or early satiety. He does not weigh himself daily.  Home Medications    Prior to Admission medications   Medication Sig Start Date End Date Taking? Authorizing Provider  carvedilol  (COREG) 3.125 MG tablet Take 1 tablet (3.125 mg total) by mouth 2 (two) times daily. 04/05/15  Yes [name], MD  Cinnamon 500 MG TABS Take 1,000 mg by mouth 2 (two) times daily.    Yes Historical Provider, MD  Coenzyme Q10 (CO Q 10 PO) Take 300 mg by mouth daily.   Yes Historical Provider, MD  enalapril (VASOTEC) 2.5 MG tablet Take 2.5 mg by mouth daily.   Yes Historical Provider, MD  folic acid (FOLVITE) Q000111Q MCG tablet Take 400 mcg by mouth daily.   Yes Historical Provider, MD  furosemide (LASIX) 20 MG tablet Take 20 mg by mouth.   Yes Historical Provider, MD  GARLIC PO Take 1 capsule by mouth daily.    Yes Historical Provider, MD  Glucosamine-Chondroit-Vit C-Mn (GLUCOSAMINE CHONDR 1500 COMPLX PO) Take 1,500 mg by mouth 2 (two) times daily.    Yes Historical Provider, MD  insulin NPH Human (HUMULIN N,NOVOLIN N) 100 UNIT/ML injection Inject 75 Units into the skin at bedtime.   Yes Historical Provider, MD  insulin regular (NOVOLIN R,HUMULIN R) 100 units/mL injection Inject 30 Units into the skin 3 (three) times daily before meals.    Yes Historical Provider, MD  metFORMIN (GLUCOPHAGE) 500 MG tablet Take 500 mg by mouth daily with breakfast.    Yes Historical Provider, MD  methotrexate (RHEUMATREX) 2.5 MG tablet Take by mouth as directed. Caution:Chemotherapy. Protect from light.   Yes Historical Provider, MD  Multiple Vitamin (MULTIVITAMIN) capsule Take 1 capsule by mouth daily.   Yes Historical Provider, MD  Omega-3 Fatty Acids (FISH OIL PO) Take by mouth 2 (two) times daily.    Yes Historical Provider, MD  pioglitazone (ACTOS) 15 MG tablet Take 15 mg by mouth daily.   Yes Historical Provider, MD  ramipril (ALTACE) 5 MG capsule Take 5 mg by mouth daily. 03/02/15  Yes Historical Provider, MD  rivaroxaban (XARELTO) 20 MG TABS tablet Take 1 tablet (20 mg total) by mouth daily. 10/29/15  Yes Minna Merritts, MD  rosuvastatin (CRESTOR) 10 MG tablet Take 10 mg by mouth daily.   Yes Historical  Provider, MD  spironolactone (ALDACTONE) 25 MG tablet Take 0.5 tablets (12.5 mg total) by mouth daily. 07/19/15  Yes [name], MD  isosorbide mononitrate (IMDUR) 30 MG 24 hr tablet Take 0.5 tablets (15 mg total) by mouth daily. 12/10/15   Rogelia Mire, NP    Review of Systems     as above, he has ongoing dyspnea on exertion but is otherwise fairly well compensated. He denies chest pain, PND, orthopnea, dizziness, syncope, edema or early satiety..  All other systems reviewed and are otherwise negative except as noted above.  Physical Exam    VS:  BP 120/90 mmHg  Pulse 64  Ht 5\' 10"  (1.778 m)  Wt 230 lb 6.4 oz (104.509 kg)  BMI 33.06 kg/m2 , BMI Body mass index is 33.06 kg/(m^2). GEN: Well nourished, well developed, in no acute distress. HEENT: normal. Neck: Supple, no JVD, carotid bruits, or masses. Cardiac:  Irregularly irregular, no murmurs, rubs, or gallops. No clubbing, cyanosis, edema.  Radials/DP/PT 2+ and equal bilaterally.  Respiratory:  Respirations regular and unlabored, clear to auscultation bilaterally. GI: Soft, nontender, nondistended, BS + x 4. MS: no deformity or atrophy. Skin: warm and dry, no rash. Neuro:  Strength and sensation are intact. Psych: Normal affect.  Accessory Clinical Findings     catheterization reviewed in detail with patient and wife.  Assessment & Plan    1.   Coronary artery disease status post coronary artery bypass grafting: patient has been having dyspnea on exertion, which likely represents an anginal equivalent. Recent catheterization revealed occlusion of the vein graft to the OM 3 with native left circumflex and OM 3 disease. This area was felt to be a small territory and thus medical therapy was recommended. He continues to have dyspnea and exertion despite stable weight and euvolemia on exam. I will initiate isosorbide mononitrate 15 mg daily and otherwise continue beta blocker, ACE inhibitor ,and statin therapy. I recommended  that he follow his symptoms and contact us if he continues to have dyspnea as we may be able to titrate his Imdur further.   2. Chronic systolic congestive heart failure/mixed ischemic and nonischemic cardiopathy: as above, he does have dyspnea on exertion but is otherwise euvolemic on exam. He is not very careful with his salt intake. Heart rate and blood pressure are stable.  We discussed the importance of daily weights, sodium restriction, medication compliance, and symptom reporting and he verbalizes understanding. Continue beta blocker, ACE inhibitor, spironolactone , and Lasix therapy.   3. Permanent A.  Fibrillation: rate controlled on beta blocker. He is anticoagulated on xarelto.    4. Hypertensive heart disease: stable on beta blocker, ACE inhibitor, Lasix, and spironolactone.   5. Hyperlipidemia: This is followed by primary care. He is on Crestor 10 mg.   6. Disposition: follow-up in 3 months or sooner if necessary.  Murray Hodgkins, NP 12/10/2015, 1:07 PM

## 2015-12-21 LAB — CUP PACEART REMOTE DEVICE CHECK
Battery Remaining Longevity: 72 mo
Date Time Interrogation Session: 20170105070017
HIGH POWER IMPEDANCE MEASURED VALUE: 84 Ohm
HighPow Impedance: 84 Ohm
Lead Channel Impedance Value: 450 Ohm
Lead Channel Setting Pacing Amplitude: 2.5 V
Lead Channel Setting Pacing Pulse Width: 0.5 ms
Lead Channel Setting Sensing Sensitivity: 0.5 mV
MDC IDC LEAD IMPLANT DT: 20130403
MDC IDC LEAD LOCATION: 753860
MDC IDC MSMT BATTERY REMAINING PERCENTAGE: 67 %
MDC IDC MSMT BATTERY VOLTAGE: 2.96 V
MDC IDC MSMT LEADCHNL RV PACING THRESHOLD AMPLITUDE: 1 V
MDC IDC MSMT LEADCHNL RV PACING THRESHOLD PULSEWIDTH: 0.5 ms
MDC IDC MSMT LEADCHNL RV SENSING INTR AMPL: 11.7 mV
MDC IDC PG SERIAL: 7026078
MDC IDC STAT BRADY RV PERCENT PACED: 1.6 %

## 2015-12-24 ENCOUNTER — Encounter: Payer: Self-pay | Admitting: Cardiology

## 2016-02-01 ENCOUNTER — Other Ambulatory Visit: Payer: Self-pay | Admitting: Cardiovascular Disease

## 2016-03-09 ENCOUNTER — Ambulatory Visit (INDEPENDENT_AMBULATORY_CARE_PROVIDER_SITE_OTHER): Payer: Medicare Other | Admitting: Cardiovascular Disease

## 2016-03-09 ENCOUNTER — Encounter: Payer: Self-pay | Admitting: Cardiovascular Disease

## 2016-03-09 ENCOUNTER — Telehealth: Payer: Self-pay | Admitting: Cardiovascular Disease

## 2016-03-09 VITALS — BP 88/50 | HR 58 | Ht 70.0 in | Wt 229.0 lb

## 2016-03-09 DIAGNOSIS — I5022 Chronic systolic (congestive) heart failure: Secondary | ICD-10-CM

## 2016-03-09 DIAGNOSIS — I509 Heart failure, unspecified: Secondary | ICD-10-CM | POA: Diagnosis not present

## 2016-03-09 MED ORDER — SPIRONOLACTONE 25 MG PO TABS: 12.5000 mg | ORAL_TABLET | Freq: Every day | ORAL | Status: DC

## 2016-03-09 MED ORDER — CARVEDILOL 3.125 MG PO TABS: 3.1250 mg | ORAL_TABLET | Freq: Two times a day (BID) | ORAL | Status: DC

## 2016-03-09 NOTE — Progress Notes (Signed)
Cardiology Office Note   Date:  03/09/2016   ID:  Andres Gandy Sr., DOB Feb 18, 1941, MRN [id number]  PCP:  [name], MD  Cardiologist:   Kathlyn Sacramento, MD   Chief Complaint  Patient presents with  . Congestive Heart Failure    no sx      History of Present Illness: Andres DENNEN Sr. is a 75 y.o. male who presents for a follow up regarding chronic systolic heart failure, chronic atrial fibrillation and coronary artery disease.   He had previous one-vessel CABG in 1994 for angioplasty in the left circumflex complicated by dissection. Cardiac catheterization in December 2016 showed occluded SVG to OM 3. The mid left circumflex had diffuse 90% disease into small OM3 which was also diffusely diseased and relatively small in size. I recommended medical therapy. His symptoms include exertional dyspnea. Small dose Imdur was added upon follow-up but it made no difference in his symptoms. He continues to be active and does do yard work on a regular basis. No orthopnea, PND or leg edema. I reviewed the most recent cardiac catheterization with him today.  Past Medical History  Diagnosis Date  . Sleep apnea     On CPAP  . Mixed Ischemic and Non-ischemic Cardiomyopathy     a. 07/2012 s/p SJM AICD (RV lead only);  b. 03/2015 Echo: EF 30-35%, diff HK;  c. EF 25% by LV gram.  . Ventricular tachycardia -inducible at EP testing     a. 07/2012 s/ SJM AICD.  [name] Polymyalgia rheumatica (HCC)     On steroids  . Chronic systolic congestive heart failure ([place])     a. 03/2015 Echo: EF 30-35%, diff HK, mild MR, mod dil LA, mildly dil RA, mild TR.  . ICD (implantable cardiac defibrillator) in place     a. 02/2012 s/p SJM 1257-40Q Fortify Assurance single lead AICD.  [name] Type II diabetes mellitus ([place])   . Nephrolithiasis 1970's  . Coronary artery disease     a. 1994 s/p CABG x 1 (VG->OM3);  b. 10/2015 Cath: LM nl, LAD min irregs, D1 nl, D2 min irregs, LCX 38m, OM2 nl, OM3 90 (small territory),  VG->OM3 100, RCA min irregs, RPDA/RPL1/RPL2/RPL3 nl, EF 25%-->Med Rx.  [name] Hyperlipidemia   . Atrial fibrillation, persistent-long-term   . Arthritis     Past Surgical History  Procedure Laterality Date  . Coronary artery bypass graft  1994    [facility] ; CABG X1  . Cataract extraction w/ intraocular lens  implant, bilateral  2011  . Total knee arthroplasty  2011    left  . Joint replacement    . Knee arthroscopy  2012    right  . Insert / replace / remove pacemaker  02/28/12    "w/defibrillator"  . Cardioversion  03/01/2012    Procedure: CARDIOVERSION;  Surgeon: Deboraha Sprang, MD;  Location: [facility] OR;  Service: Cardiovascular;  Laterality: N/A;  . Cardiac catheterization  02/28/2012    Minor disease in LAD and RCA, LCX: 70% mid and occluded distally. Patent SVG to OM3  . Basel cell removal      left arm  . Electrophysiology study N/A 02/28/2012    Procedure: ELECTROPHYSIOLOGY STUDY;  Surgeon: Deboraha Sprang, MD;  Location: [facility] CATH LAB;  Service: Cardiovascular;  Laterality: N/A;  . Left heart catheterization with coronary/graft angiogram N/A 02/28/2012    Procedure: LEFT HEART CATHETERIZATION WITH [name];  Surgeon: [name], MD;  Location: [place] CATH LAB;  Service: Cardiovascular;  Laterality: N/A;  . Cardiac catheterization N/A 11/08/2015    Procedure: Left Heart Cath and Cors/Grafts Angiography;  Surgeon: [name], MD;  Location: [place] CV LAB;  Service: Cardiovascular;  Laterality: N/A;     Current Outpatient Prescriptions  Medication Sig Dispense Refill  . carvedilol (COREG) 3.125 MG tablet Take 1 tablet (3.125 mg total) by mouth 2 (two) times daily. 180 tablet 3  . Cinnamon 500 MG TABS Take 1,000 mg by mouth 2 (two) times daily.     . Coenzyme Q10 (CO Q 10 PO) Take 300 mg by mouth daily.    . enalapril (VASOTEC) 2.5 MG tablet Take 2.5 mg by mouth daily.    . folic acid (FOLVITE) Q000111Q MCG tablet Take 400 mcg by mouth daily.    . furosemide (LASIX) 20 MG  tablet TAKE ONE (1) TABLET BY MOUTH EVERY DAY 90 tablet 3  . GARLIC PO Take 1 capsule by mouth daily.     . Glucosamine-Chondroit-Vit C-Mn (GLUCOSAMINE CHONDR 1500 COMPLX PO) Take 1,500 mg by mouth 2 (two) times daily.     . insulin NPH Human (HUMULIN N,NOVOLIN N) 100 UNIT/ML injection Inject 75 Units into the skin at bedtime.    . insulin regular (NOVOLIN R,HUMULIN R) 100 units/mL injection Inject 30 Units into the skin 3 (three) times daily before meals.     . isosorbide mononitrate (IMDUR) 30 MG 24 hr tablet Take 0.5 tablets (15 mg total) by mouth daily. 30 tablet 6  . metFORMIN (GLUCOPHAGE) 500 MG tablet Take 500 mg by mouth daily with breakfast.     . methotrexate (RHEUMATREX) 2.5 MG tablet Take by mouth as directed. Caution:Chemotherapy. Protect from light.    . Multiple Vitamin (MULTIVITAMIN) capsule Take 1 capsule by mouth daily.    . Omega-3 Fatty Acids (FISH OIL PO) Take by mouth 2 (two) times daily.     . pioglitazone (ACTOS) 15 MG tablet Take 15 mg by mouth daily.    . rivaroxaban (XARELTO) 20 MG TABS tablet Take 1 tablet (20 mg total) by mouth daily. 30 tablet 6  . rosuvastatin (CRESTOR) 10 MG tablet Take 10 mg by mouth daily.    [name] spironolactone (ALDACTONE) 25 MG tablet Take 0.5 tablets (12.5 mg total) by mouth daily. 30 tablet 3   No current facility-administered medications for this visit.    Allergies:   Review of patient's allergies indicates no known allergies.    Social History:  The patient  reports that he quit smoking about 28 years ago. His smoking use included Cigarettes. He has a 25 pack-year smoking history. He has never used smokeless tobacco. He reports that he does not drink alcohol or use illicit drugs.   Family History:  The patient's family history includes Heart attack in his brother and father.    ROS:  Please see the history of present illness.   Otherwise, review of systems are positive for none.   All other systems are reviewed and negative.     PHYSICAL EXAM: VS:  BP 88/50 mmHg  Pulse 58  Ht 5\' 10"  (1.778 m)  Wt 229 lb (103.874 kg)  BMI 32.86 kg/m2  SpO2 92% , BMI Body mass index is 32.86 kg/(m^2). GEN: Well nourished, well developed, in no acute distress HEENT: normal Neck: no JVD, carotid bruits, or masses Cardiac: Irregularly irregular; no murmurs, rubs, or gallops,no edema  Respiratory:  clear to auscultation bilaterally, normal work of breathing GI: soft, nontender, nondistended, + BS  MS: no deformity or atrophy Skin: warm and dry, no rash Neuro:  Strength and sensation are intact Psych: euthymic mood, full affect   EKG:  EKG is ordered today. The ekg ordered today demonstrates atrial fibrillation with PVCs, incomplete right bundle branch block.   Recent Labs: 11/01/2015: BUN 25*; Creatinine, Ser 1.37*; Hemoglobin 14.2; Platelets 204; Potassium 4.2; Sodium 137    Lipid Panel No results found for: CHOL, TRIG, HDL, CHOLHDL, VLDL, LDLCALC, LDLDIRECT    Wt Readings from Last 3 Encounters:  03/09/16 229 lb (103.874 kg)  12/10/15 230 lb 6.4 oz (104.509 kg)  11/08/15 229 lb (103.874 kg)      ASSESSMENT AND PLAN:  1.  Chronic systolic heart failure: Most recent ejection fraction was 15%. He fluctuates between [place] heart Association class II and class III. He appears to be euvolemic. Not able to uptitrate his heart failure medications due to hypotension.  2. Coronary artery disease: Mainly affecting the left circumflex. I have recommended medical therapy as outlined above. Imdur made no change in his symptoms. Given that his blood pressure is low, I elected to discontinue the medication.  3. Chronic atrial fibrillation: Ventricular rate is well controlled. I reviewed his labs from March which showed stable creatinine and electrolytes. Continue anticoagulation with Xarelto.  4. Diabetes mellitus: Given his known history of systolic heart failure, consider stopping Actos. I asked him to discuss this with  his primary care physician.  5. Hyperlipidemia: Continue treatment with rosuvastatin with a target LDL of less than 70.    Disposition:   FU with me in 4 months  Signed,  Kathlyn Sacramento, MD  03/09/2016 9:52 AM    [place]

## 2016-03-09 NOTE — Patient Instructions (Signed)
Medication Instructions:  Your physician has recommended you make the following change in your medication:  STOP taking Imdur   Labwork: none  Testing/Procedures: none  Follow-Up: Your physician wants you to follow-up in: 4 months with [name].  You will receive a reminder letter in the mail two months in advance. If you don't receive a letter, please call our office to schedule the follow-up appointment.   Any Other Special Instructions Will Be Listed Below (If Applicable).     If you need a refill on your cardiac medications before your next appointment, please call your pharmacy.

## 2016-03-09 NOTE — Telephone Encounter (Signed)
Lmov for patient to call back and make 46M fu apt from 03/09/16 with Dr Fletcher Anon

## 2016-04-07 ENCOUNTER — Telehealth: Payer: Self-pay | Admitting: Cardiology

## 2016-04-07 NOTE — Telephone Encounter (Signed)
Attempted to call pt and request that he send remote transmission b/c his home monitor has not updated in at least 8 days. No answer. Number was busy. Unable to leave a message.

## 2016-04-11 ENCOUNTER — Ambulatory Visit (INDEPENDENT_AMBULATORY_CARE_PROVIDER_SITE_OTHER): Payer: Medicare Other | Admitting: Internal Medicine

## 2016-04-11 ENCOUNTER — Encounter (INDEPENDENT_AMBULATORY_CARE_PROVIDER_SITE_OTHER): Payer: Self-pay

## 2016-04-11 ENCOUNTER — Encounter: Payer: Self-pay | Admitting: Internal Medicine

## 2016-04-11 VITALS — BP 116/70 | HR 68 | Ht 70.0 in | Wt 232.8 lb

## 2016-04-11 DIAGNOSIS — I255 Ischemic cardiomyopathy: Secondary | ICD-10-CM

## 2016-04-11 DIAGNOSIS — I481 Persistent atrial fibrillation: Secondary | ICD-10-CM | POA: Diagnosis not present

## 2016-04-11 DIAGNOSIS — I25709 Atherosclerosis of coronary artery bypass graft(s), unspecified, with unspecified angina pectoris: Secondary | ICD-10-CM

## 2016-04-11 DIAGNOSIS — I4819 Other persistent atrial fibrillation: Secondary | ICD-10-CM

## 2016-04-11 DIAGNOSIS — I5022 Chronic systolic (congestive) heart failure: Secondary | ICD-10-CM

## 2016-04-11 LAB — CUP PACEART INCLINIC DEVICE CHECK
Brady Statistic RV Percent Paced: 1.6 %
HIGH POWER IMPEDANCE MEASURED VALUE: 79.875
Implantable Lead Location: 753860
Lead Channel Impedance Value: 450 Ohm
Lead Channel Pacing Threshold Pulse Width: 0.5 ms
Lead Channel Sensing Intrinsic Amplitude: 11.7 mV
Lead Channel Setting Pacing Amplitude: 2.5 V
Lead Channel Setting Sensing Sensitivity: 0.5 mV
MDC IDC LEAD IMPLANT DT: 20130403
MDC IDC MSMT BATTERY REMAINING LONGEVITY: 69.6
MDC IDC MSMT LEADCHNL RV PACING THRESHOLD AMPLITUDE: 1 V
MDC IDC SESS DTM: 20170516125123
MDC IDC SET LEADCHNL RV PACING PULSEWIDTH: 0.5 ms
Pulse Gen Serial Number: 7026078

## 2016-04-11 NOTE — Progress Notes (Signed)
skf      Patient Care Team: [name], MD as PCP - General (Family Medicine)   HPI  Andres Gandy Sr. is a 75 y.o. male Seen in followup for ICD implanted for ischemic/nonischemic cardiomyopathy  and inducible ventricular tachycardia -April 2013 catheterization April 2013 demonstrating patent single vein graft to the OM. bypass had been  done emergently in 1994  EF was 25%  echo 2011 at [facility] was 14%  Repeat catheterization 12/16>>> occluded SVG to OM 3. The mid left circumflex had diffuse 90% disease into small OM3 which was also diffusely diseased and relatively small in size. Medical therapy was recommended   The patient denies chest pain, shortness of breath, nocturnal dyspnea, orthopnea or peripheral edema.  There have been no palpitations, lightheadedness or syncope. 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   On Rivaroxaban without bleeding issues Past Medical History  Diagnosis Date  . Sleep apnea     On CPAP  . Mixed Ischemic and Non-ischemic Cardiomyopathy     a. 07/2012 s/p SJM AICD (RV lead only);  b. 03/2015 Echo: EF 30-35%, diff HK;  c. EF 25% by LV gram.  . Ventricular tachycardia -inducible at EP testing     a. 07/2012 s/ SJM AICD.  [name] Polymyalgia rheumatica (HCC)     On steroids  . Chronic systolic congestive heart failure ([place])     a. 03/2015 Echo: EF 30-35%, diff HK, mild MR, mod dil LA, mildly dil RA, mild TR.  . ICD (implantable cardiac defibrillator) in place     a. 02/2012 s/p SJM 1257-40Q Fortify Assurance single lead AICD.  [name] Type II diabetes mellitus ([place])   . Nephrolithiasis 1970's  . Coronary artery disease     a. 1994 s/p CABG x 1 (VG->OM3);  b. 10/2015 Cath: LM nl, LAD min irregs, D1 nl, D2 min irregs, LCX 48m, OM2 nl, OM3 90 (small territory), VG->OM3 100, RCA min irregs, RPDA/RPL1/RPL2/RPL3 nl, EF 25%-->Med Rx.  [name] Hyperlipidemia   . Atrial fibrillation, persistent-long-term   . Arthritis     Past  Surgical History  Procedure Laterality Date  . Coronary artery bypass graft  1994    [facility] ; CABG X1  . Cataract extraction w/ intraocular lens  implant, bilateral  2011  . Total knee arthroplasty  2011    left  . Joint replacement    . Knee arthroscopy  2012    right  . Insert / replace / remove pacemaker  02/28/12    "w/defibrillator"  . Cardioversion  03/01/2012    Procedure: CARDIOVERSION;  Surgeon: Deboraha Sprang, MD;  Location: [facility] OR;  Service: Cardiovascular;  Laterality: N/A;  . Cardiac catheterization  02/28/2012    Minor disease in LAD and RCA, LCX: 70% mid and occluded distally. Patent SVG to OM3  . Basel cell removal      left arm  . Electrophysiology study N/A 02/28/2012    Procedure: ELECTROPHYSIOLOGY STUDY;  Surgeon: Deboraha Sprang, MD;  Location: [facility] CATH LAB;  Service: Cardiovascular;  Laterality: N/A;  . Left heart catheterization with coronary/graft angiogram N/A 02/28/2012    Procedure: LEFT HEART CATHETERIZATION WITH [name];  Surgeon: [name], MD;  Location: [place] CATH LAB;  Service: Cardiovascular;  Laterality: N/A;  . Cardiac catheterization N/A 11/08/2015    Procedure: Left Heart Cath and Cors/Grafts Angiography;  Surgeon: [name], MD;  Location: [place] CV LAB;  Service: Cardiovascular;  Laterality: N/A;    Current Outpatient Prescriptions  Medication Sig Dispense Refill  . carvedilol (COREG) 3.125 MG tablet Take 1 tablet (3.125 mg total) by mouth 2 (two) times daily. 60 tablet 3  . Cinnamon 500 MG TABS Take 1,000 mg by mouth 2 (two) times daily.     . Coenzyme Q10 (CO Q 10 PO) Take 300 mg by mouth daily.    . enalapril (VASOTEC) 2.5 MG tablet Take 2.5 mg by mouth daily.    . folic acid (FOLVITE) Q000111Q MCG tablet Take 400 mcg by mouth daily.    . furosemide (LASIX) 20 MG tablet TAKE ONE (1) TABLET BY MOUTH EVERY DAY 90 tablet 3  . GARLIC PO Take 1 capsule by mouth daily.     . Glucosamine-Chondroit-Vit C-Mn (GLUCOSAMINE CHONDR 1500  COMPLX PO) Take 1,500 mg by mouth 2 (two) times daily.     . insulin NPH Human (HUMULIN N,NOVOLIN N) 100 UNIT/ML injection Inject 75 Units into the skin at bedtime.    . insulin regular (NOVOLIN R,HUMULIN R) 100 units/mL injection Inject 30 Units into the skin 3 (three) times daily before meals.     . metFORMIN (GLUCOPHAGE) 500 MG tablet Take 500 mg by mouth daily with breakfast.     . methotrexate (RHEUMATREX) 2.5 MG tablet Take by mouth as directed. Caution:Chemotherapy. Protect from light.    . Multiple Vitamin (MULTIVITAMIN) capsule Take 1 capsule by mouth daily.    . Omega-3 Fatty Acids (FISH OIL PO) Take by mouth 2 (two) times daily.     . pioglitazone (ACTOS) 15 MG tablet Take 15 mg by mouth daily.    . rivaroxaban (XARELTO) 20 MG TABS tablet Take 1 tablet (20 mg total) by mouth daily. 30 tablet 6  . rosuvastatin (CRESTOR) 10 MG tablet Take 10 mg by mouth daily.    [name] spironolactone (ALDACTONE) 25 MG tablet Take 0.5 tablets (12.5 mg total) by mouth daily. 30 tablet 3   No current facility-administered medications for this visit.    No Known Allergies  Review of Systems negative except from HPI and PMH  Physical Exam BP 116/70 mmHg  Ht 5\' 10"  (1.778 m)  Wt 232 lb 12 oz (105.575 kg)  BMI 33.40 kg/m2 Well developed and nourished in no acute distress HENT normal Neck supple  Clear Device pocket well healed; without hematoma or erythema.  There is no tethering  Regular rate and rhythm, no murmurs or gallops Abd-soft with active BS No Clubbing cyanosis tr edema Skin-warm and dry A & Oriented  Grossly normal sensory and motor function  Atrial fibrillation at 73 Intervals-/12/42 Essentially unchanged from 2014  Assessment and  Plan  Congestive heart failure-chronic-systolic  Atrial fibrillation-permanent  Cardiomyopathy ischemic/nonischemic  Orthostatic intolerance-improved  High Risk Medication Surveillance  Obstructive sleep apnea treaetd  Ventricular  tachycardia-inducible  No intercurrent Ventricular tachycardia  ICD single chamber-[employer] The patient's device was interrogated.  The information was reviewed. No changes were made in the programming.     Euvolemic continue current meds  Without symptoms of ischemia  Continue current meds  Check K on high risk medications

## 2016-04-11 NOTE — Patient Instructions (Signed)
Medication Instructions: - Your physician recommends that you continue on your current medications as directed. Please refer to the Current Medication list given to you today.  Labwork: - Your physician recommends that you have lab work today: [employer]  Procedures/Testing: - none  Follow-Up: - Remote monitoring is used to monitor your Pacemaker of ICD from home. This monitoring reduces the number of office visits required to check your device to one time per year. It allows [place] to keep an eye on the functioning of your device to ensure it is working properly. You are scheduled for a device check from home on 07/11/16. You may send your transmission at any time that day. If you have a wireless device, the transmission will be sent automatically. After your physician reviews your transmission, you will receive a postcard with your next transmission date.  - Your physician wants you to follow-up in: 1 year with Dr. Caryl Comes. You will receive a reminder letter in the mail two months in advance. If you don't receive a letter, please call our office to schedule the follow-up appointment.  Any Additional Special Instructions Will Be Listed Below (If Applicable).     If you need a refill on your cardiac medications before your next appointment, please call your pharmacy.

## 2016-04-12 LAB — BASIC METABOLIC PANEL
BUN/Creatinine Ratio: 16 (ref 10–24)
BUN: 21 mg/dL (ref 8–27)
CO2: 15 mmol/L — AB (ref 18–29)
Calcium: 9.6 mg/dL (ref 8.6–10.2)
Chloride: 97 mmol/L (ref 96–106)
Creatinine, Ser: 1.33 mg/dL — ABNORMAL HIGH (ref 0.76–1.27)
GFR calc Af Amer: 60 mL/min/{1.73_m2} (ref >59)
GFR, EST NON AFRICAN AMERICAN: 52 mL/min/{1.73_m2} — AB (ref >59)
GLUCOSE: 266 mg/dL — AB (ref 65–99)
POTASSIUM: 5.2 mmol/L (ref 3.5–5.2)
SODIUM: 136 mmol/L (ref 134–144)

## 2016-04-18 ENCOUNTER — Other Ambulatory Visit: Payer: Self-pay | Admitting: *Deleted

## 2016-04-18 DIAGNOSIS — E878 Other disorders of electrolyte and fluid balance, not elsewhere classified: Secondary | ICD-10-CM

## 2016-04-18 DIAGNOSIS — I5022 Chronic systolic (congestive) heart failure: Secondary | ICD-10-CM

## 2016-04-18 DIAGNOSIS — I255 Ischemic cardiomyopathy: Secondary | ICD-10-CM

## 2016-05-03 ENCOUNTER — Encounter: Payer: Self-pay | Admitting: Internal Medicine

## 2016-05-22 ENCOUNTER — Other Ambulatory Visit: Payer: Self-pay | Admitting: Cardiovascular Disease

## 2016-06-16 ENCOUNTER — Other Ambulatory Visit: Payer: Self-pay | Admitting: *Deleted

## 2016-06-16 MED ORDER — CARVEDILOL 3.125 MG PO TABS: 3.1250 mg | ORAL_TABLET | Freq: Two times a day (BID) | ORAL | Status: DC

## 2016-06-16 NOTE — Telephone Encounter (Signed)
Requested Prescriptions   Signed Prescriptions Disp Refills  . carvedilol (COREG) 3.125 MG tablet 60 tablet 3    Sig: Take 1 tablet (3.125 mg total) by mouth 2 (two) times daily.    Authorizing Provider: Kathlyn Sacramento A    Ordering User: Britt Bottom

## 2016-07-11 ENCOUNTER — Ambulatory Visit (INDEPENDENT_AMBULATORY_CARE_PROVIDER_SITE_OTHER): Payer: Medicare Other | Admitting: *Deleted

## 2016-07-11 DIAGNOSIS — Z95810 Presence of automatic (implantable) cardiac defibrillator: Secondary | ICD-10-CM

## 2016-07-11 DIAGNOSIS — I255 Ischemic cardiomyopathy: Secondary | ICD-10-CM

## 2016-07-11 DIAGNOSIS — I5022 Chronic systolic (congestive) heart failure: Secondary | ICD-10-CM

## 2016-07-11 NOTE — Progress Notes (Signed)
Remote ICD transmission.   

## 2016-07-12 ENCOUNTER — Encounter: Payer: Self-pay | Admitting: Cardiology

## 2016-07-14 LAB — CUP PACEART REMOTE DEVICE CHECK
Battery Remaining Percentage: 62 %
Battery Voltage: 2.96 V
Date Time Interrogation Session: 20170815073819
HIGH POWER IMPEDANCE MEASURED VALUE: 84 Ohm
HIGH POWER IMPEDANCE MEASURED VALUE: 84 Ohm
Implantable Lead Implant Date: 20130403
Lead Channel Impedance Value: 440 Ohm
Lead Channel Pacing Threshold Amplitude: 1 V
Lead Channel Pacing Threshold Pulse Width: 0.5 ms
Lead Channel Sensing Intrinsic Amplitude: 11.7 mV
Lead Channel Setting Pacing Pulse Width: 0.5 ms
MDC IDC LEAD LOCATION: 753860
MDC IDC MSMT BATTERY REMAINING LONGEVITY: 67 mo
MDC IDC PG SERIAL: 7026078
MDC IDC SET LEADCHNL RV PACING AMPLITUDE: 2.5 V
MDC IDC SET LEADCHNL RV SENSING SENSITIVITY: 0.5 mV
MDC IDC STAT BRADY RV PERCENT PACED: 1.5 %

## 2016-07-17 ENCOUNTER — Encounter: Payer: Self-pay | Admitting: Cardiovascular Disease

## 2016-07-17 ENCOUNTER — Ambulatory Visit (INDEPENDENT_AMBULATORY_CARE_PROVIDER_SITE_OTHER): Payer: Medicare Other | Admitting: Cardiovascular Disease

## 2016-07-17 VITALS — BP 130/78 | HR 65 | Ht 70.0 in | Wt 234.0 lb

## 2016-07-17 DIAGNOSIS — E785 Hyperlipidemia, unspecified: Secondary | ICD-10-CM

## 2016-07-17 DIAGNOSIS — I5022 Chronic systolic (congestive) heart failure: Secondary | ICD-10-CM | POA: Diagnosis not present

## 2016-07-17 DIAGNOSIS — I2510 Atherosclerotic heart disease of native coronary artery without angina pectoris: Secondary | ICD-10-CM | POA: Diagnosis not present

## 2016-07-17 DIAGNOSIS — I4891 Unspecified atrial fibrillation: Secondary | ICD-10-CM

## 2016-07-17 MED ORDER — EPLERENONE 25 MG PO TABS: 25.0000 mg | ORAL_TABLET | Freq: Every day | ORAL | 5 refills | Status: DC

## 2016-07-17 NOTE — Progress Notes (Signed)
Cardiology Office Note   Date:  07/17/2016   ID:  Andres Gandy Sr., DOB 06-Aug-1941, MRN [id number]  PCP:  [name], MD  Cardiologist:   Kathlyn Sacramento, MD   Chief Complaint  Patient presents with  . Other    4 month follow up. Meds reviewed by the patient verbally. "doing well."       History of Present Illness: Andres DOMANSKI Sr. is a 75 y.o. male who presents for a follow up regarding chronic systolic heart failure, chronic atrial fibrillation and coronary artery disease.   He had previous one-vessel CABG in 1994 for angioplasty in the left circumflex complicated by dissection. Cardiac catheterization in December 2016 showed occluded SVG to OM 3. The mid left circumflex had diffuse 90% disease into small OM3 which was also diffusely diseased and relatively small in size. I recommended medical therapy. His symptoms include exertional dyspnea. Small dose Imdur was added upon follow-up but it made no difference in his symptoms. He has been doing well overall with no chest pain.He reports that his exertional dyspnea is stable and he continues to be active in golf very frequently. His main issue is left nipple tenderness with minimal swelling.   Past Medical History:  Diagnosis Date  . Arthritis   . Atrial fibrillation, persistent-long-term   . Chronic systolic congestive heart failure ([place])    a. 03/2015 Echo: EF 30-35%, diff HK, mild MR, mod dil LA, mildly dil RA, mild TR.  [name] Coronary artery disease    a. 1994 s/p CABG x 1 (VG->OM3);  b. 10/2015 Cath: LM nl, LAD min irregs, D1 nl, D2 min irregs, LCX 28m, OM2 nl, OM3 90 (small territory), VG->OM3 100, RCA min irregs, RPDA/RPL1/RPL2/RPL3 nl, EF 25%-->Med Rx.  [name] Hyperlipidemia   . ICD (implantable cardiac defibrillator) in place    a. 02/2012 s/p SJM 1257-40Q Fortify Assurance single lead AICD.  [name] Mixed Ischemic and Non-ischemic Cardiomyopathy    a. 07/2012 s/p SJM AICD (RV lead only);  b. 03/2015 Echo: EF 30-35%, diff  HK;  c. EF 25% by LV gram.  . Nephrolithiasis 1970's  . Polymyalgia rheumatica (HCC)    On steroids  . Sleep apnea    On CPAP  . Type II diabetes mellitus ([place])   . Ventricular tachycardia -inducible at EP testing    a. 07/2012 s/ SJM AICD.    Past Surgical History:  Procedure Laterality Date  . basel cell removal     left arm  . CARDIAC CATHETERIZATION  02/28/2012   Minor disease in LAD and RCA, LCX: 70% mid and occluded distally. Patent SVG to OM3  . CARDIAC CATHETERIZATION N/A 11/08/2015   Procedure: Left Heart Cath and Cors/Grafts Angiography;  Surgeon: [name], MD;  Location: [place] CV LAB;  Service: Cardiovascular;  Laterality: N/A;  . CARDIOVERSION  03/01/2012   Procedure: CARDIOVERSION;  Surgeon: Deboraha Sprang, MD;  Location: [place];  Service: Cardiovascular;  Laterality: N/A;  . CATARACT EXTRACTION W/ INTRAOCULAR LENS  IMPLANT, BILATERAL  2011  . CORONARY ARTERY BYPASS GRAFT  1994   [place] ; CABG X1  . ELECTROPHYSIOLOGY STUDY N/A 02/28/2012   Procedure: ELECTROPHYSIOLOGY STUDY;  Surgeon: Deboraha Sprang, MD;  Location: [facility] CATH LAB;  Service: Cardiovascular;  Laterality: N/A;  . INSERT / REPLACE / REMOVE PACEMAKER  02/28/12   "w/defibrillator"  . JOINT REPLACEMENT    . KNEE ARTHROSCOPY  2012   right  . LEFT HEART CATHETERIZATION WITH  CORONARY/GRAFT ANGIOGRAM N/A 02/28/2012   Procedure: LEFT HEART CATHETERIZATION WITH [name];  Surgeon: [name], MD;  Location: [place] CATH LAB;  Service: Cardiovascular;  Laterality: N/A;  . TOTAL KNEE ARTHROPLASTY  2011   left     Current Outpatient Prescriptions  Medication Sig Dispense Refill  . carvedilol (COREG) 3.125 MG tablet Take 1 tablet (3.125 mg total) by mouth 2 (two) times daily. 60 tablet 3  . Cinnamon 500 MG TABS Take 1,000 mg by mouth 2 (two) times daily.     . Coenzyme Q10 (CO Q 10 PO) Take 300 mg by mouth daily.    . enalapril (VASOTEC) 2.5 MG tablet Take 2.5 mg by mouth daily.    . folic acid  (FOLVITE) Q000111Q MCG tablet Take 400 mcg by mouth daily.    . furosemide (LASIX) 20 MG tablet TAKE ONE (1) TABLET BY MOUTH EVERY DAY 90 tablet 3  . GARLIC PO Take 1 capsule by mouth daily.     . Glucosamine-Chondroit-Vit C-Mn (GLUCOSAMINE CHONDR 1500 COMPLX PO) Take 1,500 mg by mouth 2 (two) times daily.     . insulin NPH Human (HUMULIN N,NOVOLIN N) 100 UNIT/ML injection Inject 75 Units into the skin at bedtime.    . insulin regular (NOVOLIN R,HUMULIN R) 100 units/mL injection Inject 30 Units into the skin 3 (three) times daily before meals.     . metFORMIN (GLUCOPHAGE) 500 MG tablet Take 0.5 mg tablet in the am with 500 mg in the pm.    . methotrexate (RHEUMATREX) 2.5 MG tablet Take by mouth as directed. Caution:Chemotherapy. Protect from light.    . Multiple Vitamin (MULTIVITAMIN) capsule Take 1 capsule by mouth daily.    . Omega-3 Fatty Acids (FISH OIL PO) Take by mouth 2 (two) times daily.     . pioglitazone (ACTOS) 15 MG tablet Take 15 mg by mouth daily.    . rosuvastatin (CRESTOR) 10 MG tablet Take 10 mg by mouth daily.    [name] spironolactone (ALDACTONE) 25 MG tablet Take 0.5 tablets (12.5 mg total) by mouth daily. 30 tablet 3  . XARELTO 20 MG TABS tablet TAKE ONE (1) TABLET EACH DAY 30 tablet 3   No current facility-administered medications for this visit.     Allergies:   Review of patient's allergies indicates no known allergies.    Social History:  The patient  reports that he quit smoking about 28 years ago. His smoking use included Cigarettes. He has a 25.00 pack-year smoking history. He has never used smokeless tobacco. He reports that he does not drink alcohol or use drugs.   Family History:  The patient's family history includes Heart attack in his brother and father.    ROS:  Please see the history of present illness.   Otherwise, review of systems are positive for none.   All other systems are reviewed and negative.    PHYSICAL EXAM: VS:  BP 130/78 (BP Location: Left Arm,  Patient Position: Sitting, Cuff Size: Normal)   Pulse 65   Ht 5\' 10"  (1.778 m)   Wt 234 lb (106.1 kg)   BMI 33.58 kg/m  , BMI Body mass index is 33.58 kg/m. GEN: Well nourished, well developed, in no acute distress  HEENT: normal  Neck: no JVD, carotid bruits, or masses Cardiac: Irregularly irregular; no murmurs, rubs, or gallops,no edema  Respiratory:  clear to auscultation bilaterally, normal work of breathing GI: soft, nontender, nondistended, + BS MS: no deformity or atrophy  Skin: warm  and dry, no rash Neuro:  Strength and sensation are intact Psych: euthymic mood, full affect   EKG:  EKG is ordered today. The ekg ordered today demonstrates atrial fibrillation with PVCs, incomplete right bundle branch block. Low voltage  Recent Labs: 11/01/2015: Hemoglobin 14.2; Platelets 204 04/11/2016: BUN 21; Creatinine, Ser 1.33; Potassium 5.2; Sodium 136    Lipid Panel No results found for: CHOL, TRIG, HDL, CHOLHDL, VLDL, LDLCALC, LDLDIRECT    Wt Readings from Last 3 Encounters:  07/17/16 234 lb (106.1 kg)  04/11/16 232 lb 12 oz (105.6 kg)  03/09/16 229 lb (103.9 kg)      ASSESSMENT AND PLAN:  1.  Chronic systolic heart failure: Most recent ejection fraction was 15%. He fluctuates between [place] heart Association class II and class III. He appears to be euvolemic.  Left nipple tenderness could be due to spironolactone. Thus, I elected to change this to eplerenone 25 mg once daily. Check basic metabolic profile in one week.  2. Coronary artery disease: Mainly affecting the left circumflex. I have recommended medical therapy as outlined above. Imdur made no change in his symptoms.  No change in symptoms after stopping longer acting nitroglycerin.  3. Chronic atrial fibrillation: Ventricular rate is well controlled. I reviewed his labs from May which showed stable creatinine and electrolytes. Continue anticoagulation with Xarelto.  4. Diabetes mellitus:  Managed by primary care  physician  5. Hyperlipidemia: Continue treatment with rosuvastatin with a target LDL of less than 70.  6. S/p ICD placement: Continue to follow-up with Dr. Caryl Comes.    Disposition:   FU with me in 6 months  Signed,  Kathlyn Sacramento, MD  07/17/2016 1:26 PM    [place]

## 2016-07-17 NOTE — Patient Instructions (Signed)
Medication Instructions:  Your physician has recommended you make the following change in your medication:  STOP taking spironolactone START taking Inspra 25mg  once daily  Labwork: BMET in one week  Testing/Procedures: none  Follow-Up: Your physician wants you to follow-up in: six months with [name].  You will receive a reminder letter in the mail two months in advance. If you don't receive a letter, please call our office to schedule the follow-up appointment.   Any Other Special Instructions Will Be Listed Below (If Applicable).     If you need a refill on your cardiac medications before your next appointment, please call your pharmacy.

## 2016-07-26 ENCOUNTER — Other Ambulatory Visit (INDEPENDENT_AMBULATORY_CARE_PROVIDER_SITE_OTHER): Payer: Medicare Other

## 2016-07-26 DIAGNOSIS — I4891 Unspecified atrial fibrillation: Secondary | ICD-10-CM

## 2016-07-27 LAB — BASIC METABOLIC PANEL
BUN/Creatinine Ratio: 18 (ref 10–24)
BUN: 24 mg/dL (ref 8–27)
CALCIUM: 9.5 mg/dL (ref 8.6–10.2)
CHLORIDE: 100 mmol/L (ref 96–106)
CO2: 20 mmol/L (ref 18–29)
Creatinine, Ser: 1.31 mg/dL — ABNORMAL HIGH (ref 0.76–1.27)
GFR calc Af Amer: 61 mL/min/{1.73_m2} (ref >59)
GFR calc non Af Amer: 53 mL/min/{1.73_m2} — ABNORMAL LOW (ref >59)
GLUCOSE: 153 mg/dL — AB (ref 65–99)
POTASSIUM: 5.1 mmol/L (ref 3.5–5.2)
SODIUM: 139 mmol/L (ref 134–144)

## 2016-09-11 ENCOUNTER — Other Ambulatory Visit: Payer: Self-pay

## 2016-09-11 MED ORDER — RIVAROXABAN 20 MG PO TABS: ORAL_TABLET | ORAL | 3 refills | Status: DC

## 2016-10-10 ENCOUNTER — Ambulatory Visit (INDEPENDENT_AMBULATORY_CARE_PROVIDER_SITE_OTHER): Payer: Medicare Other | Admitting: *Deleted

## 2016-10-10 DIAGNOSIS — I255 Ischemic cardiomyopathy: Secondary | ICD-10-CM

## 2016-10-10 DIAGNOSIS — I5022 Chronic systolic (congestive) heart failure: Secondary | ICD-10-CM

## 2016-10-11 NOTE — Progress Notes (Signed)
Remote ICD transmission.   

## 2016-10-21 LAB — CUP PACEART REMOTE DEVICE CHECK
Battery Remaining Longevity: 65 mo
Date Time Interrogation Session: 20171114090018
HIGH POWER IMPEDANCE MEASURED VALUE: 83 Ohm
HighPow Impedance: 83 Ohm
Lead Channel Setting Pacing Amplitude: 2.5 V
Lead Channel Setting Pacing Pulse Width: 0.5 ms
Lead Channel Setting Sensing Sensitivity: 0.5 mV
MDC IDC LEAD IMPLANT DT: 20130403
MDC IDC LEAD LOCATION: 753860
MDC IDC MSMT BATTERY REMAINING PERCENTAGE: 60 %
MDC IDC MSMT BATTERY VOLTAGE: 2.95 V
MDC IDC MSMT LEADCHNL RV IMPEDANCE VALUE: 390 Ohm
MDC IDC MSMT LEADCHNL RV PACING THRESHOLD AMPLITUDE: 1 V
MDC IDC MSMT LEADCHNL RV PACING THRESHOLD PULSEWIDTH: 0.5 ms
MDC IDC MSMT LEADCHNL RV SENSING INTR AMPL: 11.7 mV
MDC IDC PG IMPLANT DT: 20130403
MDC IDC PG SERIAL: 7026078
MDC IDC STAT BRADY RV PERCENT PACED: 1.8 %

## 2016-11-14 ENCOUNTER — Other Ambulatory Visit: Payer: Self-pay | Admitting: *Deleted

## 2016-11-14 MED ORDER — CARVEDILOL 3.125 MG PO TABS: 3.1250 mg | ORAL_TABLET | Freq: Two times a day (BID) | ORAL | 3 refills | Status: DC

## 2017-01-10 ENCOUNTER — Ambulatory Visit (INDEPENDENT_AMBULATORY_CARE_PROVIDER_SITE_OTHER): Payer: Medicare Other | Admitting: *Deleted

## 2017-01-10 DIAGNOSIS — I5022 Chronic systolic (congestive) heart failure: Secondary | ICD-10-CM | POA: Diagnosis not present

## 2017-01-10 DIAGNOSIS — I255 Ischemic cardiomyopathy: Secondary | ICD-10-CM

## 2017-01-10 NOTE — Progress Notes (Signed)
Remote ICD transmission.   

## 2017-01-11 ENCOUNTER — Encounter: Payer: Self-pay | Admitting: Cardiology

## 2017-01-12 ENCOUNTER — Other Ambulatory Visit: Payer: Self-pay | Admitting: *Deleted

## 2017-01-12 MED ORDER — EPLERENONE 25 MG PO TABS: 25.0000 mg | ORAL_TABLET | Freq: Every day | ORAL | 3 refills | Status: DC

## 2017-01-12 MED ORDER — RIVAROXABAN 20 MG PO TABS: ORAL_TABLET | ORAL | 1 refills | Status: DC

## 2017-01-18 ENCOUNTER — Encounter: Payer: Self-pay | Admitting: Cardiovascular Disease

## 2017-01-18 ENCOUNTER — Ambulatory Visit (INDEPENDENT_AMBULATORY_CARE_PROVIDER_SITE_OTHER): Payer: Medicare Other | Admitting: Cardiovascular Disease

## 2017-01-18 VITALS — BP 104/60 | HR 70 | Ht 70.0 in | Wt 235.0 lb

## 2017-01-18 DIAGNOSIS — I482 Chronic atrial fibrillation, unspecified: Secondary | ICD-10-CM

## 2017-01-18 DIAGNOSIS — I2510 Atherosclerotic heart disease of native coronary artery without angina pectoris: Secondary | ICD-10-CM

## 2017-01-18 DIAGNOSIS — I5022 Chronic systolic (congestive) heart failure: Secondary | ICD-10-CM | POA: Diagnosis not present

## 2017-01-18 NOTE — Progress Notes (Signed)
Cardiology Office Note   Date:  01/18/2017   ID:  Andres Gandy Sr., DOB 04/26/1941, MRN [id number]  PCP:  [name], MD  Cardiologist:   Kathlyn Sacramento, MD   Chief Complaint  Patient presents with  . other    6 month f/u. Meds reviewed verbally with pt.      History of Present Illness: Andres ALDER Sr. is a 76 y.o. male who presents for a follow up regarding chronic systolic heart failure, chronic atrial fibrillation and coronary artery disease.   He had previous one-vessel CABG in 1994 for angioplasty in the left circumflex complicated by dissection. Cardiac catheterization in December 2016 showed occluded SVG to OM 3. The mid left circumflex had diffuse 90% disease into small OM3 which was also diffusely diseased and relatively small in size. EF was 15% with mildly elevated LVEDP. I recommended medical therapy. His blood pressure tends to be on the low side which has limited up titration of antihypertensive medications. He has been doing reasonably well with stable exertional dyspnea and no chest pain. He had an episode about 3 weeks ago while he was walking in the parking lot of his church. He felt dizzy with poor balance and he thought he passed out for a few seconds. However, he reports that he did not fall and thus unlikely that he completely passed out. No recurrent episodes since then.   Past Medical History:  Diagnosis Date  . Arthritis   . Atrial fibrillation, persistent-long-term   . Chronic systolic congestive heart failure ([place])    a. 03/2015 Echo: EF 30-35%, diff HK, mild MR, mod dil LA, mildly dil RA, mild TR.  [name] Coronary artery disease    a. 1994 s/p CABG x 1 (VG->OM3);  b. 10/2015 Cath: LM nl, LAD min irregs, D1 nl, D2 min irregs, LCX 31m, OM2 nl, OM3 90 (small territory), VG->OM3 100, RCA min irregs, RPDA/RPL1/RPL2/RPL3 nl, EF 25%-->Med Rx.  [name] Hyperlipidemia   . ICD (implantable cardiac defibrillator) in place    a. 02/2012 s/p SJM 1257-40Q  Fort[employer] Assurance single lead AICD.  [name] Mixed Ischemic and Non-ischemic Cardiomyopathy    a. 07/2012 s/p SJM AICD (RV lead only);  b. 03/2015 Echo: EF 30-35%, diff HK;  c. EF 25% by LV gram.  . Nephrolithiasis 1970's  . Polymyalgia rheumatica (HCC)    On steroids  . Sleep apnea    On CPAP  . Type II diabetes mellitus ([place])   . Ventricular tachycardia -inducible at EP testing    a. 07/2012 s/ SJM AICD.    Past Surgical History:  Procedure Laterality Date  . basel cell removal     left arm  . CARDIAC CATHETERIZATION  02/28/2012   Minor disease in LAD and RCA, LCX: 70% mid and occluded distally. Patent SVG to OM3  . CARDIAC CATHETERIZATION N/A 11/08/2015   Procedure: Left Heart Cath and Cors/Grafts Angiography;  Surgeon: [name], MD;  Location: [place] CV LAB;  Service: Cardiovascular;  Laterality: N/A;  . CARDIOVERSION  03/01/2012   Procedure: CARDIOVERSION;  Surgeon: Deboraha Sprang, MD;  Location: [facility] [place];  Service: Cardiovascular;  Laterality: N/A;  . CATARACT EXTRACTION W/ INTRAOCULAR LENS  IMPLANT, BILATERAL  2011  . CORONARY ARTERY BYPASS GRAFT  1994   [place] ; CABG X1  . ELECTROPHYSIOLOGY STUDY N/A 02/28/2012   Procedure: ELECTROPHYSIOLOGY STUDY;  Surgeon: Deboraha Sprang, MD;  Location: [facility] C[facility] LAB;  Service: Cardiovascular;  Laterality: N/A;  .  INSERT / REPLACE / REMOVE PACEMAKER  02/28/12   "w/defibrillator"  . JOINT REPLACEMENT    . KNEE ARTHROSCOPY  2012   right  . LEFT HEART CATHETERIZATION WITH CORONARY/GRAFT ANGIOGRAM N/A 02/28/2012   Procedure: LEFT HEART CATHETERIZATION WITH [name];  Surgeon: [name], MD;  Location: [place] CATH LAB;  Service: Cardiovascular;  Laterality: N/A;  . TOTAL KNEE ARTHROPLASTY  2011   left     Current Outpatient Prescriptions  Medication Sig Dispense Refill  . carvedilol (COREG) 3.125 MG tablet Take 1 tablet (3.125 mg total) by mouth 2 (two) times daily. 60 tablet 3  . Cinnamon 500 MG TABS Take 1,000 mg by mouth 2  (two) times daily.     . Coenzyme Q10 (CO Q 10 PO) Take 300 mg by mouth daily.    . enalapril (VASOTEC) 2.5 MG tablet Take 2.5 mg by mouth daily.    [name] eplerenone (INSPRA) 25 MG tablet Take 1 tablet (25 mg total) by mouth daily. 30 tablet 3  . folic acid (FOLVITE) [id number] MCG tablet Take 400 mcg by mouth daily.    . furosemide (LASIX) 20 MG tablet TAKE ONE (1) TABLET BY MOUTH EVERY DAY 90 tablet 3  . GARLIC PO Take 1 capsule by mouth daily.     . Glucosamine-Chondroit-Vit C-Mn (GLUCOSAMINE CHONDR 1500 COMPLX PO) Take 1,500 mg by mouth 2 (two) times daily.     . insulin NPH Human (HUMULIN N,NOVOLIN N) 100 UNIT/ML injection Inject 75 Units into the skin at bedtime.    . insulin regular (NOVOLIN R,HUMULIN R) 100 units/mL injection Inject 30 Units into the skin 3 (three) times daily before meals.     . metFORMIN (GLUCOPHAGE) 500 MG tablet Take 0.5 mg tablet in the am with 500 mg in the pm.    . methotrexate (RHEUMATREX) 2.5 MG tablet Take by mouth as directed. Caution:Chemotherapy. Protect from light.    . Multiple Vitamin (MULTIVITAMIN) capsule Take 1 capsule by mouth daily.    . Omega-3 Fatty Acids (FISH OIL PO) Take by mouth 2 (two) times daily.     . pioglitazone (ACTOS) 15 MG tablet Take 15 mg by mouth daily.    . rivaroxaban (XARELTO) 20 MG TABS tablet TAKE ONE (1) TABLET EACH DAY 30 tablet 1  . rosuvastatin (CRESTOR) 10 MG tablet Take 10 mg by mouth daily.     No current facility-administered medications for this visit.     Allergies:   Patient has no known allergies.    Social History:  The patient  reports that he quit smoking about 29 years ago. His smoking use included Cigarettes. He has a 25.00 pack-year smoking history. He has never used smokeless tobacco. He reports that he does not drink alcohol or use drugs.   Family History:  The patient's family history includes Heart attack in his brother and father.    ROS:  Please see the history of present illness.   Otherwise, review of  systems are positive for none.   All other systems are reviewed and negative.    PHYSICAL EXAM: VS:  BP 104/60 (BP Location: Left Arm, Patient Position: Sitting, Cuff Size: Normal)   Pulse 70   Ht 5\' 10"  (1.778 m)   Wt 235 lb (106.6 kg)   BMI 33.72 kg/m  , BMI Body mass index is 33.72 kg/m. GEN: Well nourished, well developed, in no acute distress  HEENT: normal  Neck: no JVD, carotid bruits, or masses Cardiac: Irregularly irregular; no  murmurs, rubs, or gallops,no edema  Respiratory:  clear to auscultation bilaterally, normal work of breathing GI: soft, nontender, nondistended, + BS MS: no deformity or atrophy  Skin: warm and dry, no rash Neuro:  Strength and sensation are intact Psych: euthymic mood, full affect   EKG:  EKG is ordered today. The ekg ordered today demonstrates atrial fibrillation with  Low voltage  Recent Labs: 07/26/2016: BUN 24; Creatinine, Ser 1.31; Potassium 5.1; Sodium 139    Lipid Panel No results found for: CHOL, TRIG, HDL, CHOLHDL, VLDL, LDLCALC, LDLDIRECT    Wt Readings from Last 3 Encounters:  01/18/17 235 lb (106.6 kg)  07/17/16 234 lb (106.1 kg)  04/11/16 232 lb 12 oz (105.6 kg)      ASSESSMENT AND PLAN:  1.  Chronic systolic heart failure: Most recent ejection fraction was 15%. He fluctuates between [place] heart Association class II and class III. He appears to be euvolemic.  Continue treatment with small dose carvedilol, enalapril and eplerenone. His blood pressure is too low to allow Entresto.  2. Coronary artery disease: Mainly affecting the left circumflex. I have recommended medical therapy as outlined above. Imdur made no change in his symptoms.  No change in symptoms after stopping longer acting nitroglycerin.  3. Chronic atrial fibrillation: Ventricular rate is well controlled. Continue anticoagulation with Xarelto.  4. Diabetes mellitus:  Managed by primary care physician  5. Hyperlipidemia: Continue treatment with  rosuvastatin with a target LDL of less than 70.  6. S/p ICD placement: Continue to follow-up with Dr. Caryl Comes.  The patient had what seems to be a presyncopal episode a few weeks ago. His device has been interrogated since then and am going to check with the device clinic to see if there was any arrhythmic events. If he develops recurrent symptoms with no evidence of arrhythmia, he might require neurology evaluation.    Disposition:   FU with me in 6 months  Signed,  Kathlyn Sacramento, MD  01/18/2017 6:18 PM    [place]

## 2017-01-18 NOTE — Patient Instructions (Signed)
Medication Instructions:  Your physician recommends that you continue on your current medications as directed. Please refer to the Current Medication list given to you today.   Labwork: none  Testing/Procedures: none  Follow-Up: Your physician wants you to follow-up in: six months with Dr. Arida.  You will receive a reminder letter in the mail two months in advance. If you don't receive a letter, please call our office to schedule the follow-up appointment.   Any Other Special Instructions Will Be Listed Below (If Applicable).     If you need a refill on your cardiac medications before your next appointment, please call your pharmacy.   

## 2017-01-19 LAB — CUP PACEART REMOTE DEVICE CHECK
Battery Remaining Percentage: 58 %
Battery Voltage: 2.95 V
Brady Statistic RV Percent Paced: 2.2 %
Date Time Interrogation Session: 20180213103944
HIGH POWER IMPEDANCE MEASURED VALUE: 83 Ohm
HIGH POWER IMPEDANCE MEASURED VALUE: 83 Ohm
Implantable Lead Implant Date: 20130403
Implantable Pulse Generator Implant Date: 20130403
Lead Channel Impedance Value: 410 Ohm
Lead Channel Pacing Threshold Amplitude: 1 V
Lead Channel Pacing Threshold Pulse Width: 0.5 ms
Lead Channel Sensing Intrinsic Amplitude: 11.7 mV
MDC IDC LEAD LOCATION: 753860
MDC IDC MSMT BATTERY REMAINING LONGEVITY: 62 mo
MDC IDC PG SERIAL: 7026078
MDC IDC SET LEADCHNL RV PACING AMPLITUDE: 2.5 V
MDC IDC SET LEADCHNL RV PACING PULSEWIDTH: 0.5 ms
MDC IDC SET LEADCHNL RV SENSING SENSITIVITY: 0.5 mV

## 2017-03-23 ENCOUNTER — Other Ambulatory Visit: Payer: Self-pay | Admitting: Cardiovascular Disease

## 2017-03-23 MED ORDER — RIVAROXABAN 20 MG PO TABS: ORAL_TABLET | ORAL | 3 refills | Status: DC

## 2017-04-09 ENCOUNTER — Other Ambulatory Visit: Payer: Self-pay

## 2017-04-09 MED ORDER — FUROSEMIDE 20 MG PO TABS: ORAL_TABLET | ORAL | 3 refills | Status: DC

## 2017-04-09 NOTE — Telephone Encounter (Signed)
Refill sent for Furosemide 20 mg  

## 2017-04-16 ENCOUNTER — Telehealth: Payer: Self-pay

## 2017-04-17 ENCOUNTER — Encounter: Payer: Self-pay | Admitting: Internal Medicine

## 2017-04-17 ENCOUNTER — Telehealth: Payer: Self-pay | Admitting: *Deleted

## 2017-04-17 ENCOUNTER — Ambulatory Visit (INDEPENDENT_AMBULATORY_CARE_PROVIDER_SITE_OTHER): Payer: Medicare Other | Admitting: Internal Medicine

## 2017-04-17 VITALS — BP 112/70 | HR 77 | Ht 70.0 in | Wt 234.8 lb

## 2017-04-17 DIAGNOSIS — Z95810 Presence of automatic (implantable) cardiac defibrillator: Secondary | ICD-10-CM | POA: Diagnosis not present

## 2017-04-17 DIAGNOSIS — I5022 Chronic systolic (congestive) heart failure: Secondary | ICD-10-CM | POA: Diagnosis not present

## 2017-04-17 DIAGNOSIS — I472 Ventricular tachycardia, unspecified: Secondary | ICD-10-CM

## 2017-04-17 DIAGNOSIS — I255 Ischemic cardiomyopathy: Secondary | ICD-10-CM | POA: Diagnosis not present

## 2017-04-17 DIAGNOSIS — I428 Other cardiomyopathies: Secondary | ICD-10-CM | POA: Diagnosis not present

## 2017-04-17 DIAGNOSIS — I482 Chronic atrial fibrillation: Secondary | ICD-10-CM

## 2017-04-17 DIAGNOSIS — I4821 Permanent atrial fibrillation: Secondary | ICD-10-CM

## 2017-04-17 LAB — CUP PACEART INCLINIC DEVICE CHECK
Date Time Interrogation Session: 20180522140202
Lead Channel Pacing Threshold Amplitude: 1 V
Lead Channel Pacing Threshold Pulse Width: 0.5 ms
Lead Channel Setting Pacing Pulse Width: 0.5 ms
MDC IDC LEAD IMPLANT DT: 20130403
MDC IDC LEAD LOCATION: 753860
MDC IDC MSMT LEADCHNL RV SENSING INTR AMPL: 11.7 mV
MDC IDC PG IMPLANT DT: 20130403
MDC IDC PG SERIAL: 7026078
MDC IDC SET LEADCHNL RV PACING AMPLITUDE: 2.5 V
MDC IDC SET LEADCHNL RV SENSING SENSITIVITY: 0.5 mV
MDC IDC STAT BRADY RV PERCENT PACED: 0.68 %

## 2017-04-17 MED ORDER — CARVEDILOL 6.25 MG PO TABS: 6.2500 mg | ORAL_TABLET | Freq: Two times a day (BID) | ORAL | Status: DC

## 2017-04-17 MED ORDER — EPLERENONE 25 MG PO TABS: 12.5000 mg | ORAL_TABLET | Freq: Every day | ORAL | Status: DC

## 2017-04-17 NOTE — Progress Notes (Signed)
skf      Patient Care Team: [name], MD as PCP - General (Family Medicine)   HPI  Andres Gandy Sr. is a 76 y.o. male Seen in followup for ICD implanted for ischemic/nonischemic cardiomyopathy  and inducible ventricular tachycardia -April 2013 catheterization April 2013 demonstrating patent single vein graft to the OM. bypass had been  done emergently in 1994   EF was 25%  echo 2011 at [facility] was 14%  Repeat catheterization 12/16>>> occluded SVG to OM 3. The mid left circumflex had diffuse 90% disease into small OM3 which was also diffusely diseased and relatively small in size  EF 15%. Medical therapy was recommended   He was mowing the yard. He lost consciousness. No associated chest pain. No worsening shortness of breath prior to the event. It was hot humid.  He has modest exercise intolerance but without chest pain.  Appropriate Therapy yes  VT monomorphic 5/18 Cl 220--240     Date Cr K Mg  4/18  1.4 5.2     Other labs normal  He has permanent atrial fibrillation since April 2013.   On Rivaroxaban without bleeding issues Past Medical History:  Diagnosis Date  . Arthritis   . Atrial fibrillation, persistent-long-term   . Chronic systolic congestive heart failure ([place])    a. 03/2015 Echo: EF 30-35%, diff HK, mild MR, mod dil LA, mildly dil RA, mild TR.  [name] Coronary artery disease    a. 1994 s/p CABG x 1 (VG->OM3);  b. 10/2015 Cath: LM nl, LAD min irregs, D1 nl, D2 min irregs, LCX 15m, OM2 nl, OM3 90 (small territory), VG->OM3 100, RCA min irregs, RPDA/RPL1/RPL2/RPL3 nl, EF 25%-->Med Rx.  [name] Hyperlipidemia   . ICD (implantable cardiac defibrillator) in place    a. 02/2012 s/p SJM 1257-40Q Fortify Assurance single lead AICD.  [name] Mixed Ischemic and Non-ischemic Cardiomyopathy    a. 07/2012 s/p SJM AICD (RV lead only);  b. 03/2015 Echo: EF 30-35%, diff HK;  c. EF 25% by LV gram.  . Nephrolithiasis 1970's  . Polymyalgia rheumatica (HCC)    On steroids  . Sleep apnea    On CPAP  . Type II diabetes mellitus ([place])   . Ventricular tachycardia -inducible at EP testing    a. 07/2012 s/ SJM AICD.    Past Surgical History:  Procedure Laterality Date  . basel cell removal     left arm  . CARDIAC CATHETERIZATION  02/28/2012   Minor disease in LAD and RCA, LCX: 70% mid and occluded distally. Patent SVG to OM3  . CARDIAC CATHETERIZATION N/A 11/08/2015   Procedure: Left Heart Cath and Cors/Grafts Angiography;  Surgeon: [name], MD;  Location: [place] CV LAB;  Service: Cardiovascular;  Laterality: N/A;  . CARDIOVERSION  03/01/2012   Procedure: CARDIOVERSION;  Surgeon: Deboraha Sprang, MD;  Location: [place];  Service: Cardiovascular;  Laterality: N/A;  . CATARACT EXTRACTION W/ INTRAOCULAR LENS  IMPLANT, BILATERAL  2011  . CORONARY ARTERY BYPASS GRAFT  1994   [place] ; CABG X1  . ELECTROPHYSIOLOGY STUDY N/A 02/28/2012   Procedure: ELECTROPHYSIOLOGY STUDY;  Surgeon: Deboraha Sprang, MD;  Location[facility] CATH LAB;  Service: Cardiovascular;  Laterality: N/A;  . INSERT / REPLACE / REMOVE PACEMAKER  02/28/12   "w/defibrillator"  . JOINT REPLACEMENT    . KNEE ARTHROSCOPY  2012   right  . LEFT HEART CATHETERIZATION WITH CORONARY/GRAFT ANGIOGRAM N/A 02/28/2012   Procedure: LEFT HEART CATHETERIZATION WITH CORONARY/GRAFT ANGIOGRAM;  Surgeon:  [name], MD;  Location: [place] CATH LAB;  Service: Cardiovascular;  Laterality: N/A;  . TOTAL KNEE ARTHROPLASTY  2011   left    Current Outpatient Prescriptions  Medication Sig Dispense Refill  . carvedilol (COREG) 3.125 MG tablet Take 1 tablet (3.125 mg total) by mouth 2 (two) times daily. 60 tablet 3  . Cinnamon 500 MG TABS Take 1,000 mg by mouth 2 (two) times daily.     . Coenzyme Q10 (CO Q 10 PO) Take 300 mg by mouth daily.    . enalapril (VASOTEC) 2.5 MG tablet Take 2.5 mg by mouth daily.    [name] eplerenone (INSPRA) 25 MG tablet Take 1 tablet (25 mg total) by mouth daily. 30 tablet 3  . folic acid (FOLVITE) 063 MCG tablet Take  400 mcg by mouth daily.    . furosemide (LASIX) 20 MG tablet TAKE ONE (1) TABLET BY MOUTH EVERY DAY 90 tablet 3  . GARLIC PO Take 1 capsule by mouth daily.     . Glucosamine-Chondroit-Vit C-Mn (GLUCOSAMINE CHONDR 1500 COMPLX PO) Take 1,500 mg by mouth 2 (two) times daily.     . insulin NPH Human (HUMULIN N,NOVOLIN N) 100 UNIT/ML injection Inject 75 Units into the skin at bedtime.    . insulin regular (NOVOLIN R,HUMULIN R) 100 units/mL injection Inject 30 Units into the skin 3 (three) times daily before meals.     [name] levothyroxine (SYNTHROID, LEVOTHROID) 50 MCG tablet Take 50 mcg by mouth daily before breakfast.     . metFORMIN (GLUCOPHAGE) 500 MG tablet Take 0.5 mg tablet in the am with 500 mg in the pm.    . methotrexate (RHEUMATREX) 2.5 MG tablet Take by mouth as directed. Caution:Chemotherapy. Protect from light.    . Multiple Vitamin (MULTIVITAMIN) capsule Take 1 capsule by mouth daily.    . Omega-3 Fatty Acids (FISH OIL PO) Take by mouth 2 (two) times daily.     . pioglitazone (ACTOS) 15 MG tablet Take 15 mg by mouth daily.    . rivaroxaban (XARELTO) 20 MG TABS tablet TAKE ONE (1) TABLET EACH DAY 30 tablet 3  . rosuvastatin (CRESTOR) 10 MG tablet Take 10 mg by mouth daily.     No current facility-administered medications for this visit.     No Known Allergies  Review of Systems negative except from HPI and PMH  Physical Exam BP 112/70 (BP Location: Left Arm, Patient Position: Sitting, Cuff Size: Normal)   Pulse 77   Ht 5\' 10"  (1.778 m)   Wt 234 lb 12 oz (106.5 kg)   BMI 33.68 kg/m  Well developed and nourished in no acute distress HENT normal Neck supple with JVP-flat Clear Regular rate and rhythm, no murmurs or gallops Abd-soft with active BS No Clubbing cyanosis edema Skin-warm and dry A & Oriented  Grossly normal sensory and motor function   ECG personally reviewed  Atrial fibrillation at 63   Assessment and  Plan  Congestive heart failure-chronic-systolic  Atrial  fibrillation-permanent  Cardiomyopathy ischemic/nonischemic  Orthostatic intolerance-improved  High Risk Medication Surveillance   Ventricular tachycardia -monomorphic-rapid  ICD single chamber-[employer] Jude The patient's device was interrogated.  The information was reviewed. No changes were made in the programming.     Intercurrent ventricular tachycardia associated with syncope.  Interestingly, there is 100% template match. The device was reprogrammed to deliver ATP at this very rapid rate (260-70 bpm)  in addition, given the fact that it occurred with exertion, I suspect some degree of ischemia  was induced. We will decrease his Inspra 25--12.5 and increase his carvedilol 3.125--6.25. Augmented anti-ischemic therapy with ranolazine may also be of some value. I will defer this decision to when he sees Dr. Gaylyn Cheers . He is advised about [place] driving restrictions  Euvolemic continue current meds W ADjustments as above  K level high but not too high  we will plan to recheck his metabolic profile in about 2 weeks time; decrease HIS eplerenone should result in a lower potassium level  On Anticoagulation;  No bleeding issues   Continue current meds  Orthostatic intolerance is better   More than 50% of 40 min was spent in counseling related to the above

## 2017-04-17 NOTE — Patient Instructions (Addendum)
Medication Instructions: - Your physician has recommended you make the following change in your medication: 1) Increase coreg (carvedilol) to 6.25 mg- take 1 tablet by mouth twice daily 2) Decrease inspra 25 mg- take 1/2 tablet (12.5 mg) by by mouth once daily  Labwork: - Your physician recommends that you return for lab work in: 3-4 weeks- BMP  Procedures/Testing: - none ordered  Follow-Up: - Your physician recommends that you schedule a follow-up appointment in: 1 month with [name].  - Remote monitoring is used to monitor your Pacemaker of ICD from home. This monitoring reduces the number of office visits required to check your device to one time per year. It allows [place] to keep an eye on the functioning of your device to ensure it is working properly. You are scheduled for a device check from home on 07/17/17. You may send your transmission at any time that day. If you have a wireless device, the transmission will be sent automatically. After your physician reviews your transmission, you will receive a postcard with your next transmission date.  - Your physician wants you to follow-up in: 1 year with Dr. Caryl Comes. You will receive a reminder letter in the mail two months in advance. If you don't receive a letter, please call our office to schedule the follow-up appointment.    Any Additional Special Instructions Will Be Listed Below (If Applicable).     If you need a refill on your cardiac medications before your next appointment, please call your pharmacy.

## 2017-04-17 NOTE — Telephone Encounter (Signed)
After further review of VT episode in VF zone from 04/15/17, Dr. Caryl Comes recommended that morphology discriminator be turned off.  Patient is agreeable to appointment in [facility] on 04/24/17 at 4:00pm for reprogramming.  He is appreciative of call and denies any questions or concerns at this time.  Routed to scheduler to add patient to schedule.

## 2017-04-19 ENCOUNTER — Other Ambulatory Visit: Payer: Self-pay | Admitting: *Deleted

## 2017-04-19 MED ORDER — CARVEDILOL 6.25 MG PO TABS: 6.2500 mg | ORAL_TABLET | Freq: Two times a day (BID) | ORAL | 6 refills | Status: DC

## 2017-04-24 ENCOUNTER — Ambulatory Visit (INDEPENDENT_AMBULATORY_CARE_PROVIDER_SITE_OTHER): Payer: Medicare Other | Admitting: *Deleted

## 2017-04-24 DIAGNOSIS — Z95810 Presence of automatic (implantable) cardiac defibrillator: Secondary | ICD-10-CM

## 2017-04-24 DIAGNOSIS — I472 Ventricular tachycardia, unspecified: Secondary | ICD-10-CM

## 2017-04-24 LAB — CUP PACEART INCLINIC DEVICE CHECK
Brady Statistic RV Percent Paced: 2.1 %
HIGH POWER IMPEDANCE MEASURED VALUE: 79.875
Implantable Pulse Generator Implant Date: 20130403
Lead Channel Impedance Value: 412.5 Ohm
Lead Channel Pacing Threshold Amplitude: 1 V
Lead Channel Pacing Threshold Pulse Width: 0.5 ms
Lead Channel Setting Pacing Pulse Width: 0.5 ms
MDC IDC LEAD IMPLANT DT: 20130403
MDC IDC LEAD LOCATION: 753860
MDC IDC SESS DTM: 20180529174950
MDC IDC SET LEADCHNL RV PACING AMPLITUDE: 2.5 V
MDC IDC SET LEADCHNL RV SENSING SENSITIVITY: 0.5 mV
Pulse Gen Serial Number: 7026078

## 2017-04-24 NOTE — Progress Notes (Signed)
Device check in clinic by industry for morphology reprogramming (see 04/17/17 [employer] note). Morphology reprogrammed to passive collection, far field configuration. Merlin on 07/17/17 and ROV with SK/B in 12 months as scheduled.

## 2017-04-24 NOTE — Telephone Encounter (Signed)
See OV on 5/22 notes

## 2017-04-30 ENCOUNTER — Other Ambulatory Visit: Payer: Self-pay | Admitting: Internal Medicine

## 2017-05-09 ENCOUNTER — Other Ambulatory Visit: Payer: Medicare Other

## 2017-05-09 ENCOUNTER — Other Ambulatory Visit: Payer: Self-pay | Admitting: *Deleted

## 2017-05-09 ENCOUNTER — Other Ambulatory Visit
Admission: RE | Admit: 2017-05-09 | Discharge: 2017-05-09 | Disposition: A | Discharge status: Home / Self Care | Payer: Medicare Other | Source: Ambulatory Visit | Attending: Internal Medicine | Admitting: Internal Medicine

## 2017-05-09 DIAGNOSIS — I472 Ventricular tachycardia, unspecified: Secondary | ICD-10-CM

## 2017-05-09 LAB — BASIC METABOLIC PANEL
Anion gap: 7 (ref 5–15)
BUN: 26 mg/dL — AB (ref 6–20)
CHLORIDE: 104 mmol/L (ref 101–111)
CO2: 26 mmol/L (ref 22–32)
CREATININE: 1.26 mg/dL — AB (ref 0.61–1.24)
Calcium: 9.3 mg/dL (ref 8.9–10.3)
GFR calc non Af Amer: 54 mL/min — ABNORMAL LOW (ref >60)
GLUCOSE: 180 mg/dL — AB (ref 65–99)
Potassium: 4.7 mmol/L (ref 3.5–5.1)
Sodium: 137 mmol/L (ref 135–145)

## 2017-05-25 ENCOUNTER — Encounter: Payer: Self-pay | Admitting: Cardiovascular Disease

## 2017-05-25 ENCOUNTER — Ambulatory Visit (INDEPENDENT_AMBULATORY_CARE_PROVIDER_SITE_OTHER): Payer: Medicare Other | Admitting: Cardiovascular Disease

## 2017-05-25 VITALS — BP 118/74 | HR 78 | Ht 70.0 in | Wt 234.0 lb

## 2017-05-25 DIAGNOSIS — I2510 Atherosclerotic heart disease of native coronary artery without angina pectoris: Secondary | ICD-10-CM

## 2017-05-25 DIAGNOSIS — I5022 Chronic systolic (congestive) heart failure: Secondary | ICD-10-CM

## 2017-05-25 DIAGNOSIS — I482 Chronic atrial fibrillation, unspecified: Secondary | ICD-10-CM

## 2017-05-25 DIAGNOSIS — E785 Hyperlipidemia, unspecified: Secondary | ICD-10-CM | POA: Diagnosis not present

## 2017-05-25 NOTE — Progress Notes (Signed)
Cardiology Office Note   Date:  05/25/2017   ID:  Andres Gandy Sr., DOB 1941/06/17, MRN [id number]  PCP:  [name], MD  Cardiologist:   Kathlyn Sacramento, MD   Chief Complaint  Patient presents with  . OTHER    1 month f/u no complaints today. Meds reviewed verbally with pt.      History of Present Illness: Andres PROBERT Sr. is a 76 y.o. male who presents for a follow up regarding chronic systolic heart failure, chronic atrial fibrillation and coronary artery disease.   He had previous one-vessel CABG in 1994 for angioplasty in the left circumflex complicated by dissection. Cardiac catheterization in December 2016 showed occluded SVG to OM 3. The mid left circumflex had diffuse 90% disease into small OM3 which was also diffusely diseased and relatively small in size. EF was 15% with mildly elevated LVEDP. I recommended medical therapy. His blood pressure tends to be on the low side which has limited up titration of antihypertensive medications. He had a syncopal episode in May which was due to ventricular tachycardia with ICD shock. He was seen by Dr. Caryl Comes. His device was adjusted to deliver ATP and the dose of carvedilol was increased to 6.25 mg twice daily. Eplerenone was decreased by half. He has been doing reasonably well with no recurrent episodes. No chest pain. He continues to have stable exertional dyspnea. No orthopnea or PND.   Past Medical History:  Diagnosis Date  . Arthritis   . Atrial fibrillation, persistent-long-term   . Chronic systolic congestive heart failure ([place])    a. 03/2015 Echo: EF 30-35%, diff HK, mild MR, mod dil LA, mildly dil RA, mild TR.  [name] Coronary artery disease    a. 1994 s/p CABG x 1 (VG->OM3);  b. 10/2015 Cath: LM nl, LAD min irregs, D1 nl, D2 min irregs, LCX 61m, OM2 nl, OM3 90 (small territory), VG->OM3 100, RCA min irregs, RPDA/RPL1/RPL2/RPL3 nl, EF 25%-->Med Rx.  [name] Hyperlipidemia   . ICD (implantable cardiac defibrillator) in  place    a. 02/2012 s/p SJM [id number] Fortify Assurance single lead AICD.  [name] Mixed Ischemic and Non-ischemic Cardiomyopathy    a. 07/2012 s/p SJM AICD (RV lead only);  b. 03/2015 Echo: EF 30-35%, diff HK;  c. EF 25% by LV gram.  . Nephrolithiasis 1970's  . Polymyalgia rheumatica ([facility])    On steroids  . Sleep apnea    On CPAP  . Type II diabetes mellitus ([place])   . Ventricular tachycardia -inducible at EP testing    a. 07/2012 s/ SJM AICD.    Past Surgical History:  Procedure Laterality Date  . basel cell removal     left arm  . CARDIAC CATHETERIZATION  02/28/2012   Minor disease in LAD and RCA, LCX: 70% mid and occluded distally. Patent SVG to OM3  . CARDIAC CATHETERIZATION N/A 11/08/2015   Procedure: Left Heart Cath and Cors/Grafts Angiography;  Surgeon: [name], MD;  Location: [facility];  Service: Cardiovascular;  Laterality: N/A;  . CARDIOVERSION  03/01/2012   Procedure: CARDIOVERSION;  Surgeon: Deboraha Sprang, MD;  Location: [facility];  Service: Cardiovascular;  Laterality: N/A;  . CATARACT EXTRACTION W/ INTRAOCULAR LENS  IMPLANT, BILATERAL  2011  . CORONARY ARTERY BYPASS GRAFT  1994   Echo ; CABG X1  . ELECTROPHYSIOLOGY STUDY N/A 02/28/2012   Procedure: ELECTROPHYSIOLOGY STUDY;  Surgeon: Deboraha Sprang, MD;  Location: [facility];  Service: Cardiovascular;  Laterality:  N/A;  . INSERT / REPLACE / REMOVE PACEMAKER  02/28/12   "w/defibrillator"  . JOINT REPLACEMENT    . KNEE ARTHROSCOPY  2012   right  . LEFT HEART CATHETERIZATION WITH CORONARY/GRAFT ANGIOGRAM N/A 02/28/2012   Procedure: LEFT HEART CATHETERIZATION WITH [name];  Surgeon: [name], MD;  Location: [facility];  Service: Cardiovascular;  Laterality: N/A;  . TOTAL KNEE ARTHROPLASTY  2011   left     Current Outpatient Prescriptions  Medication Sig Dispense Refill  . carvedilol (COREG) 6.25 MG tablet Take 1 tablet (6.25 mg total) by mouth 2 (two) times daily. 60 tablet 6  . Cinnamon 500  MG TABS Take 1,000 mg by mouth 2 (two) times daily.     . Coenzyme Q10 (CO Q 10 PO) Take 300 mg by mouth daily.    . enalapril (VASOTEC) 2.5 MG tablet Take 2.5 mg by mouth daily.    [name] eplerenone (INSPRA) 25 MG tablet Take 0.5 tablets (12.5 mg total) by mouth daily.    . folic acid (FOLVITE) 158 MCG tablet Take 400 mcg by mouth daily.    . furosemide (LASIX) 20 MG tablet TAKE ONE (1) TABLET BY MOUTH EVERY DAY 90 tablet 3  . GARLIC PO Take 1 capsule by mouth daily.     . Glucosamine-Chondroit-Vit C-Mn (GLUCOSAMINE CHONDR 1500 COMPLX PO) Take 1,500 mg by mouth 2 (two) times daily.     . insulin NPH Human (HUMULIN N,NOVOLIN N) 100 UNIT/ML injection Inject 75 Units into the skin at bedtime.    . insulin regular (NOVOLIN R,HUMULIN R) 100 units/mL injection Inject 30 Units into the skin 3 (three) times daily before meals.     [name] levothyroxine (SYNTHROID, LEVOTHROID) 50 MCG tablet Take 50 mcg by mouth daily before breakfast.     . metFORMIN (GLUCOPHAGE) 500 MG tablet Take 0.5 mg tablet in the am with 500 mg in the pm.    . methotrexate (RHEUMATREX) 2.5 MG tablet Take by mouth as directed. Caution:Chemotherapy. Protect from light.    . Multiple Vitamin (MULTIVITAMIN) capsule Take 1 capsule by mouth daily.    . Omega-3 Fatty Acids (FISH OIL PO) Take by mouth 2 (two) times daily.     . pioglitazone (ACTOS) 15 MG tablet Take 15 mg by mouth daily.    . rivaroxaban (XARELTO) 20 MG TABS tablet TAKE ONE (1) TABLET EACH DAY 30 tablet 3  . rosuvastatin (CRESTOR) 10 MG tablet Take 10 mg by mouth daily.     No current facility-administered medications for this visit.     Allergies:   Patient has no known allergies.    Social History:  The patient  reports that he quit smoking about 29 years ago. His smoking use included Cigarettes. He has a 25.00 pack-year smoking history. He has never used smokeless tobacco. He reports that he does not drink alcohol or use drugs.   Family History:  The patient's family  history includes Heart attack in his brother and father.    ROS:  Please see the history of present illness.   Otherwise, review of systems are positive for none.   All other systems are reviewed and negative.    PHYSICAL EXAM: VS:  BP 118/74 (BP Location: Left Arm, Patient Position: Sitting, Cuff Size: Normal)   Pulse 78   Ht 5\' 10"  (1.778 m)   Wt 234 lb (106.1 kg)   BMI 33.58 kg/m  , BMI Body mass index is 33.58 kg/m. GEN: Well nourished,  well developed, in no acute distress  HEENT: normal  Neck: no JVD, carotid bruits, or masses Cardiac: Irregularly irregular; no murmurs, rubs, or gallops,no edema  Respiratory:  clear to auscultation bilaterally, normal work of breathing GI: soft, nontender, nondistended, + BS MS: no deformity or atrophy  Skin: warm and dry, no rash Neuro:  Strength and sensation are intact Psych: euthymic mood, full affect   EKG:  EKG is ordered today. The ekg ordered today demonstrates atrial fibrillation with  Low voltage  Recent Labs: 05/09/2017: BUN 26; Creatinine, Ser 1.26; Potassium 4.7; Sodium 137    Lipid Panel No results found for: CHOL, TRIG, HDL, CHOLHDL, VLDL, LDLCALC, LDLDIRECT    Wt Readings from Last 3 Encounters:  05/25/17 234 lb (106.1 kg)  04/17/17 234 lb 12 oz (106.5 kg)  01/18/17 235 lb (106.6 kg)      ASSESSMENT AND PLAN:  1.  Chronic systolic heart failure: Most recent ejection fraction was 15%.  He appears to be euvolemic.  Continue treatment with carvedilol, enalapril and eplerenone. His blood pressure is too low to allow Entresto. He continues to have intermittent episodes of hypotension.  2. Coronary artery disease: Mainly affecting the left circumflex. I have recommended medical therapy as outlined above. No clear anginal symptoms. Imdur did not make a difference in his symptoms. I agree that Ranexa might be a good option especially with its antiarrhythmic properties. However, the patient is not able to afford the  medication. This can be considered if he has recurrent ventricular arrhythmia.  3. Chronic atrial fibrillation: Ventricular rate is well controlled. Continue anticoagulation with Xarelto.  4. Diabetes mellitus:  Managed by primary care physician  5. Hyperlipidemia: Continue treatment with rosuvastatin with a target LDL of less than 70. Most recent LDL was 110. He reports myalgia with higher doses of rosuvastatin.   Disposition:   FU with me in 6 months  Signed,  Kathlyn Sacramento, MD  05/25/2017 2:21 PM    [place]

## 2017-05-25 NOTE — Patient Instructions (Signed)
Medication Instructions: Continue same medications.   Labwork: None.   Procedures/Testing: None.   Follow-Up: 6 months with Dr. Eddi Hymes.   Any Additional Special Instructions Will Be Listed Below (If Applicable).     If you need a refill on your cardiac medications before your next appointment, please call your pharmacy.   

## 2017-06-26 ENCOUNTER — Other Ambulatory Visit: Payer: Self-pay

## 2017-06-26 MED ORDER — EPLERENONE 25 MG PO TABS: 12.5000 mg | ORAL_TABLET | Freq: Every day | ORAL | 3 refills | Status: DC

## 2017-07-17 ENCOUNTER — Ambulatory Visit (INDEPENDENT_AMBULATORY_CARE_PROVIDER_SITE_OTHER): Payer: Medicare Other | Admitting: *Deleted

## 2017-07-17 DIAGNOSIS — I255 Ischemic cardiomyopathy: Secondary | ICD-10-CM

## 2017-07-18 NOTE — Progress Notes (Signed)
Remote ICD transmission.   

## 2017-07-25 LAB — CUP PACEART REMOTE DEVICE CHECK
Battery Remaining Longevity: 56 mo
Date Time Interrogation Session: 20180821080018
HighPow Impedance: 79 Ohm
HighPow Impedance: 79 Ohm
Implantable Lead Location: 753860
Implantable Pulse Generator Implant Date: 20130403
Lead Channel Setting Pacing Amplitude: 2.5 V
Lead Channel Setting Pacing Pulse Width: 0.5 ms
Lead Channel Setting Sensing Sensitivity: 0.5 mV
MDC IDC LEAD IMPLANT DT: 20130403
MDC IDC MSMT BATTERY REMAINING PERCENTAGE: 52 %
MDC IDC MSMT BATTERY VOLTAGE: 2.93 V
MDC IDC MSMT LEADCHNL RV IMPEDANCE VALUE: 400 Ohm
MDC IDC MSMT LEADCHNL RV PACING THRESHOLD AMPLITUDE: 1 V
MDC IDC MSMT LEADCHNL RV PACING THRESHOLD PULSEWIDTH: 0.5 ms
MDC IDC MSMT LEADCHNL RV SENSING INTR AMPL: 11.7 mV
MDC IDC STAT BRADY RV PERCENT PACED: 2.9 %
Pulse Gen Serial Number: 7026078

## 2017-07-26 ENCOUNTER — Other Ambulatory Visit: Payer: Self-pay | Admitting: Cardiovascular Disease

## 2017-07-27 ENCOUNTER — Encounter: Payer: Self-pay | Admitting: Cardiology

## 2017-10-16 ENCOUNTER — Ambulatory Visit (INDEPENDENT_AMBULATORY_CARE_PROVIDER_SITE_OTHER): Payer: Medicare Other | Admitting: *Deleted

## 2017-10-16 DIAGNOSIS — I255 Ischemic cardiomyopathy: Secondary | ICD-10-CM

## 2017-10-17 NOTE — Progress Notes (Signed)
Remote ICD transmission.   

## 2017-10-23 LAB — CUP PACEART REMOTE DEVICE CHECK
Battery Remaining Percentage: 51 %
Battery Voltage: 2.9 V
Brady Statistic RV Percent Paced: 3.2 %
HIGH POWER IMPEDANCE MEASURED VALUE: 78 Ohm
HighPow Impedance: 78 Ohm
Implantable Lead Implant Date: 20130403
Implantable Lead Location: 753860
Implantable Pulse Generator Implant Date: 20130403
Lead Channel Impedance Value: 400 Ohm
Lead Channel Pacing Threshold Amplitude: 1 V
Lead Channel Pacing Threshold Pulse Width: 0.5 ms
Lead Channel Sensing Intrinsic Amplitude: 11.7 mV
Lead Channel Setting Pacing Pulse Width: 0.5 ms
Lead Channel Setting Sensing Sensitivity: 0.5 mV
MDC IDC MSMT BATTERY REMAINING LONGEVITY: 55 mo
MDC IDC SESS DTM: 20181120101900
MDC IDC SET LEADCHNL RV PACING AMPLITUDE: 2.5 V
Pulse Gen Serial Number: 7026078

## 2017-10-25 ENCOUNTER — Encounter: Payer: Self-pay | Admitting: Cardiology

## 2017-11-28 ENCOUNTER — Other Ambulatory Visit: Payer: Self-pay | Admitting: Cardiovascular Disease

## 2017-11-28 ENCOUNTER — Other Ambulatory Visit: Payer: Self-pay | Admitting: Internal Medicine

## 2017-11-28 NOTE — Telephone Encounter (Signed)
Please review for refill, Thanks !  

## 2018-01-15 ENCOUNTER — Ambulatory Visit (INDEPENDENT_AMBULATORY_CARE_PROVIDER_SITE_OTHER): Payer: Medicare Other | Admitting: *Deleted

## 2018-01-15 DIAGNOSIS — I255 Ischemic cardiomyopathy: Secondary | ICD-10-CM | POA: Diagnosis not present

## 2018-01-15 NOTE — Progress Notes (Signed)
Remote ICD transmission.   

## 2018-01-16 LAB — CUP PACEART REMOTE DEVICE CHECK
Battery Voltage: 2.84 V
HIGH POWER IMPEDANCE MEASURED VALUE: 88 Ohm
HighPow Impedance: 88 Ohm
Implantable Lead Location: 753860
Implantable Pulse Generator Implant Date: 20130403
Lead Channel Impedance Value: 410 Ohm
Lead Channel Sensing Intrinsic Amplitude: 11.7 mV
Lead Channel Setting Pacing Amplitude: 2.5 V
Lead Channel Setting Pacing Pulse Width: 0.5 ms
MDC IDC LEAD IMPLANT DT: 20130403
MDC IDC MSMT BATTERY REMAINING LONGEVITY: 42 mo
MDC IDC MSMT BATTERY REMAINING PERCENTAGE: 39 %
MDC IDC MSMT LEADCHNL RV PACING THRESHOLD AMPLITUDE: 1 V
MDC IDC MSMT LEADCHNL RV PACING THRESHOLD PULSEWIDTH: 0.5 ms
MDC IDC SESS DTM: 20190219090026
MDC IDC SET LEADCHNL RV SENSING SENSITIVITY: 0.5 mV
MDC IDC STAT BRADY RV PERCENT PACED: 3.4 %
Pulse Gen Serial Number: 7026078

## 2018-01-17 ENCOUNTER — Encounter: Payer: Self-pay | Admitting: Cardiology

## 2018-02-11 ENCOUNTER — Other Ambulatory Visit: Payer: Self-pay | Admitting: Internal Medicine

## 2018-03-18 ENCOUNTER — Ambulatory Visit (HOSPITAL_COMMUNITY)
Admission: AD | Admit: 2018-03-18 | Discharge: 2018-03-18 | Disposition: A | Discharge status: Home / Self Care | Payer: Medicare Other | Source: Ambulatory Visit | Attending: Internal Medicine | Admitting: Internal Medicine

## 2018-03-18 ENCOUNTER — Telehealth: Payer: Self-pay

## 2018-03-18 ENCOUNTER — Encounter (HOSPITAL_COMMUNITY)
Admission: AD | Disposition: A | Discharge status: Home / Self Care | Payer: Self-pay | Source: Ambulatory Visit | Attending: Internal Medicine

## 2018-03-18 DIAGNOSIS — Z4502 Encounter for adjustment and management of automatic implantable cardiac defibrillator: Secondary | ICD-10-CM | POA: Diagnosis not present

## 2018-03-18 DIAGNOSIS — Z794 Long term (current) use of insulin: Secondary | ICD-10-CM | POA: Diagnosis not present

## 2018-03-18 DIAGNOSIS — M1990 Unspecified osteoarthritis, unspecified site: Secondary | ICD-10-CM | POA: Diagnosis not present

## 2018-03-18 DIAGNOSIS — E119 Type 2 diabetes mellitus without complications: Secondary | ICD-10-CM | POA: Diagnosis not present

## 2018-03-18 DIAGNOSIS — I255 Ischemic cardiomyopathy: Secondary | ICD-10-CM | POA: Diagnosis not present

## 2018-03-18 DIAGNOSIS — Z951 Presence of aortocoronary bypass graft: Secondary | ICD-10-CM | POA: Insufficient documentation

## 2018-03-18 DIAGNOSIS — T82111A Breakdown (mechanical) of cardiac pulse generator (battery), initial encounter: Secondary | ICD-10-CM

## 2018-03-18 DIAGNOSIS — I5022 Chronic systolic (congestive) heart failure: Secondary | ICD-10-CM | POA: Insufficient documentation

## 2018-03-18 DIAGNOSIS — Z87891 Personal history of nicotine dependence: Secondary | ICD-10-CM | POA: Insufficient documentation

## 2018-03-18 DIAGNOSIS — G4733 Obstructive sleep apnea (adult) (pediatric): Secondary | ICD-10-CM | POA: Diagnosis not present

## 2018-03-18 DIAGNOSIS — Z8249 Family history of ischemic heart disease and other diseases of the circulatory system: Secondary | ICD-10-CM | POA: Insufficient documentation

## 2018-03-18 DIAGNOSIS — I482 Chronic atrial fibrillation: Secondary | ICD-10-CM | POA: Insufficient documentation

## 2018-03-18 DIAGNOSIS — M353 Polymyalgia rheumatica: Secondary | ICD-10-CM | POA: Insufficient documentation

## 2018-03-18 DIAGNOSIS — E785 Hyperlipidemia, unspecified: Secondary | ICD-10-CM | POA: Diagnosis not present

## 2018-03-18 DIAGNOSIS — I25810 Atherosclerosis of coronary artery bypass graft(s) without angina pectoris: Secondary | ICD-10-CM | POA: Diagnosis not present

## 2018-03-18 DIAGNOSIS — Z7901 Long term (current) use of anticoagulants: Secondary | ICD-10-CM | POA: Diagnosis not present

## 2018-03-18 DIAGNOSIS — I472 Ventricular tachycardia: Secondary | ICD-10-CM

## 2018-03-18 HISTORY — PX: ICD GENERATOR CHANGEOUT: EP1231

## 2018-03-18 LAB — CBC
HEMATOCRIT: 43.2 % (ref 39.0–52.0)
HEMOGLOBIN: 14.3 g/dL (ref 13.0–17.0)
MCH: 34 pg (ref 26.0–34.0)
MCHC: 33.1 g/dL (ref 30.0–36.0)
MCV: 102.6 fL — ABNORMAL HIGH (ref 78.0–100.0)
Platelets: 202 10*3/uL (ref 150–400)
RBC: 4.21 MIL/uL — ABNORMAL LOW (ref 4.22–5.81)
RDW: 16.5 % — AB (ref 11.5–15.5)
WBC: 6.3 10*3/uL (ref 4.0–10.5)

## 2018-03-18 LAB — BASIC METABOLIC PANEL
Anion gap: 10 (ref 5–15)
BUN: 21 mg/dL — AB (ref 6–20)
CALCIUM: 8.9 mg/dL (ref 8.9–10.3)
CHLORIDE: 104 mmol/L (ref 101–111)
CO2: 23 mmol/L (ref 22–32)
CREATININE: 1.36 mg/dL — AB (ref 0.61–1.24)
GFR calc non Af Amer: 49 mL/min — ABNORMAL LOW (ref >60)
GFR, EST AFRICAN AMERICAN: 56 mL/min — AB (ref >60)
Glucose, Bld: 226 mg/dL — ABNORMAL HIGH (ref 65–99)
Potassium: 4.2 mmol/L (ref 3.5–5.1)
Sodium: 137 mmol/L (ref 135–145)

## 2018-03-18 LAB — SURGICAL PCR SCREEN
MRSA, PCR: NEGATIVE
STAPHYLOCOCCUS AUREUS: NEGATIVE

## 2018-03-18 LAB — GLUCOSE, CAPILLARY: Glucose-Capillary: 220 mg/dL — ABNORMAL HIGH (ref 65–99)

## 2018-03-18 SURGERY — ICD GENERATOR CHANGEOUT

## 2018-03-18 MED ORDER — FENTANYL CITRATE (PF) 100 MCG/2ML IJ SOLN
INTRAMUSCULAR | Status: DC | PRN
  Administered 2018-03-18 (×2): 25 ug via INTRAVENOUS

## 2018-03-18 MED ORDER — CEFAZOLIN SODIUM-DEXTROSE 2-4 GM/100ML-% IV SOLN
2.0000 g | INTRAVENOUS | Status: AC
  Administered 2018-03-18: 2 g via INTRAVENOUS

## 2018-03-18 MED ORDER — ACETAMINOPHEN 325 MG PO TABS: 325.0000 mg | ORAL_TABLET | ORAL | Status: DC | PRN

## 2018-03-18 MED ORDER — MUPIROCIN 2 % EX OINT
TOPICAL_OINTMENT | CUTANEOUS | Status: AC
  Administered 2018-03-18: 1 via TOPICAL
  Filled 2018-03-18: qty 22

## 2018-03-18 MED ORDER — SODIUM CHLORIDE 0.9 % IV SOLN
INTRAVENOUS | Status: AC
  Filled 2018-03-18: qty 2

## 2018-03-18 MED ORDER — SODIUM CHLORIDE 0.9 % IV SOLN
80.0000 mg | INTRAVENOUS | Status: AC
  Administered 2018-03-18: 80 mg

## 2018-03-18 MED ORDER — MIDAZOLAM HCL 5 MG/5ML IJ SOLN
INTRAMUSCULAR | Status: DC | PRN
  Administered 2018-03-18 (×2): 2 mg via INTRAVENOUS

## 2018-03-18 MED ORDER — FENTANYL CITRATE (PF) 100 MCG/2ML IJ SOLN
INTRAMUSCULAR | Status: AC
  Filled 2018-03-18: qty 2

## 2018-03-18 MED ORDER — SODIUM CHLORIDE 0.9 % IV SOLN
INTRAVENOUS | Status: DC
  Administered 2018-03-18: 12:00:00 via INTRAVENOUS

## 2018-03-18 MED ORDER — LIDOCAINE HCL 1 % IJ SOLN
INTRAMUSCULAR | Status: AC
  Filled 2018-03-18: qty 60

## 2018-03-18 MED ORDER — MIDAZOLAM HCL 5 MG/5ML IJ SOLN
INTRAMUSCULAR | Status: AC
  Filled 2018-03-18: qty 5

## 2018-03-18 MED ORDER — CHLORHEXIDINE GLUCONATE 4 % EX LIQD: 60.0000 mL | Freq: Once | CUTANEOUS | Status: DC

## 2018-03-18 MED ORDER — MUPIROCIN 2 % EX OINT
1.0000 "application " | TOPICAL_OINTMENT | Freq: Once | CUTANEOUS | Status: AC
  Administered 2018-03-18: 1 via TOPICAL

## 2018-03-18 MED ORDER — ONDANSETRON HCL 4 MG/2ML IJ SOLN: 4.0000 mg | Freq: Four times a day (QID) | INTRAMUSCULAR | Status: DC | PRN

## 2018-03-18 MED ORDER — CEFAZOLIN SODIUM-DEXTROSE 2-4 GM/100ML-% IV SOLN
INTRAVENOUS | Status: AC
  Filled 2018-03-18: qty 100

## 2018-03-18 MED ORDER — LIDOCAINE HCL (PF) 1 % IJ SOLN
INTRAMUSCULAR | Status: DC | PRN
  Administered 2018-03-18: 60 mL

## 2018-03-18 SURGICAL SUPPLY — 4 items
CABLE SURGICAL S-101-97-12 (CABLE) ×2 IMPLANT
ICD ELLIPSE VR CD1411-36Q (ICD Generator) ×2 IMPLANT
PAD DEFIB LIFELINK (PAD) ×2 IMPLANT
TRAY PACEMAKER INSERTION (PACKS) ×2 IMPLANT

## 2018-03-18 NOTE — Discharge Instructions (Signed)
**Note -identified via Obfuscation** Post procedure care instructions °Keep incision clean and dry for 10 days. °No driving for 2 days.  °You can remove outer dressing tomorrow. °Leave steri-strips (little pieces of tape) on until seen in the office for wound check appointment. °Call the office ([phone]) for redness, drainage, swelling, or fever. ° ° °Pacemaker Battery Change, Care After °This sheet gives you information about how to care for yourself after your procedure. Your health care provider may also give you more specific instructions. If you have problems or questions, contact your health care provider. °What can I expect after the procedure? °After your procedure, it is common to have: °· Pain or soreness at the site where the pacemaker was inserted. °· Swelling at the site where the pacemaker was inserted. ° °Follow these instructions at home: °Incision care °· Keep the incision clean and dry. °? Do not take baths, swim, or use a hot tub until your health care provider approves. °? You may shower the day after your procedure, or as directed by your health care provider. °? Pat the area dry with a clean towel. Do not rub the area. This may cause bleeding. °· Follow instructions from your health care provider about how to take care of your incision. Make sure you: °? Wash your hands with soap and water before you change your bandage (dressing). If soap and water are not available, use hand sanitizer. °? Change your dressing as told by your health care provider. °? Leave stitches (sutures), skin glue, or adhesive strips in place. These skin closures may need to stay in place for 2 weeks or longer. If adhesive strip edges start to loosen and curl up, you may trim the loose edges. Do not remove adhesive strips completely unless your health care provider tells you to do that. °· Check your incision area every day for signs of infection. Check for: °? More redness, swelling, or pain. °? More fluid or blood. °? Warmth. °? Pus or a bad  smell. °Activity °· Do not lift anything that is heavier than 10 lb (4.5 kg) until your health care provider says it is okay to do so. °· For the first 2 weeks, or as long as told by your health care provider: °? Avoid lifting your left arm higher than your shoulder. °? Be gentle when you move your arms over your head. It is okay to raise your arm to comb your hair. °? Avoid strenuous exercise. °· Ask your health care provider when it is okay to: °? Resume your normal activities. °? Return to work or school. °? Resume sexual activity. °Eating and drinking °· Eat a heart-healthy diet. This should include plenty of fresh fruits and vegetables, whole grains, low-fat dairy products, and lean protein like chicken and fish. °· Limit alcohol intake to no more than 1 drink a day for non-pregnant women and 2 drinks a day for men. One drink equals 12 oz of beer, 5 oz of wine, or 1½ oz of hard liquor. °· Check ingredients and nutrition facts on packaged foods and beverages. Avoid the following types of food: °? Food that is high in salt (sodium). °? Food that is high in saturated fat, like full-fat dairy or red meat. °? Food that is high in trans fat, like fried food. °? Food and drinks that are high in sugar. °Lifestyle °· Do not use any products that contain nicotine or tobacco, such as cigarettes and e-cigarettes. If you need help quitting, ask your health care provider. °·  **Note -identified via Obfuscation** Take steps to manage and control your weight. °· Get regular exercise. Aim for 150 minutes of moderate-intensity exercise (such as walking or yoga) or 75 minutes of vigorous exercise (such as running or swimming) each week. °· Manage other health problems, such as diabetes or high blood pressure. Ask your health care provider how you can manage these conditions. °General instructions °· Do not drive for 24 hours after your procedure if you were given a medicine to help you relax (sedative). °· Take over-the-counter and prescription medicines only as told  by your health care provider. °· Avoid putting pressure on the area where the pacemaker was placed. °· If you need an MRI after your pacemaker has been placed, be sure to tell the health care provider who orders the MRI that you have a pacemaker. °· Avoid close and prolonged exposure to electrical devices that have strong magnetic fields. These include: °? Cell phones. Avoid keeping them in a pocket near the pacemaker, and try using the ear opposite the pacemaker. °? MP3 players. °? Household appliances, like microwaves. °? Metal detectors. °? Electric generators. °? High-tension wires. °· Keep all follow-up visits as directed by your health care provider. This is important. °Contact a health care provider if: °· You have pain at the incision site that is not relieved by over-the-counter or prescription medicines. °· You have any of these around your incision site or coming from it: °? More redness, swelling, or pain. °? Fluid or blood. °? Warmth to the touch. °? Pus or a bad smell. °· You have a fever. °· You feel brief, occasional palpitations, light-headedness, or any symptoms that you think might be related to your heart. °Get help right away if: °· You experience chest pain that is different from the pain at the pacemaker site. °· You develop a red streak that extends above or below the incision site. °· You experience shortness of breath. °· You have palpitations or an irregular heartbeat. °· You have light-headedness that does not go away quickly. °· You faint or have dizzy spells. °· Your pulse suddenly drops or increases rapidly and does not return to normal. °· You begin to gain weight and your legs and ankles swell. °Summary °· After your procedure, it is common to have pain, soreness, and some swelling where the pacemaker was inserted. °· Make sure to keep your incision clean and dry. Follow instructions from your health care provider about how to take care of your incision. °· Check your incision every  day for signs of infection, such as more pain or swelling, pus or a bad smell, warmth, or leaking fluid and blood. °· Avoid strenuous exercise and lifting your left arm higher than your shoulder for 2 weeks, or as long as told by your health care provider. °This information is not intended to replace advice given to you by your health care provider. Make sure you discuss any questions you have with your health care provider. °Document Released: 09/03/2013 Document Revised: 10/05/2016 Document Reviewed: 10/05/2016 °Elsevier Interactive Patient Education © 2017 [employer] ° ° °

## 2018-03-18 NOTE — Telephone Encounter (Signed)
Spoke with Andres [name] and informed him of the battery performance alert on his ICD explained to Andres [name] that he had an ICD that the battery depleted early and that also did not know how long the battery would last with this type of alert, informed Andres [name] that I would discuss with Dr. Caryl Comes and call back with recommendations. Andres [name] voiced understanding, explained to Andres [name] also that we would need to get the device changed out as soon as possible due to the fact that we don't know exactly how long the battery will last once it hits this depletion. Informed Andres [name] that usually if the we surgery is scheduled for the morning and that everything goes ok then patients could go home late the same evening.

## 2018-03-18 NOTE — Telephone Encounter (Signed)
LVM for pt to call device clinic back ( ICD battery performance alert)

## 2018-03-18 NOTE — H&P (Addendum)
Cardiology Admission History and Physical:   Patient ID: Andres [name].; MRN: [id number]; DOB: 1941/09/12   Admission date: (Not on file)  Primary Care Provider: [name], MD Primary Cardiologist: Dr. Fletcher [name] Primary Electrophysiologist:  Dr. Caryl [name]  Chief Complaint:  ICD battery depletion  Patient Profile:   Andres Gandy Sr. is a 77 y.o. male with a history of CAD (remote CABG), ICM, chronic CHF (systolic), w/ICD, hx of appropriate tx for VT in 2018, DM, PMR, arthritis (on methotrexate), HLD, OSA w/CPAP, and permanent AFib.  History of Present Illness:   Andres [name] has a St. Jude Fortify Assura device, today the device clinic received an alert for early depletion.  He was called and notified by the office, and referred to the hospital for generator change.  He has been feeling well, no symptoms of illness, no fever.  He has not had any syncope or near syncope.  He has been trouble of [name] with R hamstring muscle spasms and taking cyclobenzaprine and gabapentin for this, otherwise has been on his usual state of health and feeling well.  last dose of Xarelto was this AM and last ate this AM, breakfast about 0800  Device information: SJM single chamber ICD (Fortify Assura) implanted 2013 with ICM and inducable VT noted by Dr.Klein's note + hx of appropriate therapy for VT associated with syncope in 2018  Past Medical History:  Diagnosis Date  . Arthritis   . Atrial fibrillation, persistent-long-term   . Chronic systolic congestive heart failure ([place])    a. 03/2015 Echo: EF 30-35%, diff HK, mild MR, mod dil LA, mildly dil RA, mild TR.  [name] Coronary artery disease    a. 1994 s/p CABG x 1 (VG->OM3);  b. 10/2015 Cath: LM nl, LAD min irregs, D1 nl, D2 min irregs, LCX 81m, OM2 nl, OM3 90 (small territory), VG->OM3 100, RCA min irregs, RPDA/RPL1/RPL2/RPL3 nl, EF 25%-->Med Rx.  [name] Hyperlipidemia   . ICD (implantable cardiac defibrillator) in place    a. 02/2012 s/p SJM  [id number] Fortify Assurance single lead AICD.  [name] Mixed Ischemic and Non-ischemic Cardiomyopathy    a. 07/2012 s/p SJM AICD (RV lead only);  b. 03/2015 Echo: EF 30-35%, diff HK;  c. EF 25% by LV gram.  . Nephrolithiasis 1970's  . Polymyalgia rheumatica (HCC)    On steroids  . Sleep apnea    On CPAP  . Type II diabetes mellitus ([place])   . Ventricular tachycardia -inducible at EP testing    a. 07/2012 s/ SJM AICD.    Past Surgical History:  Procedure Laterality Date  . basel cell removal     left arm  . CARDIAC CATHETERIZATION  02/28/2012   Minor disease in LAD and RCA, LCX: 70% mid and occluded distally. Patent SVG to OM3  . CARDIAC CATHETERIZATION N/A 11/08/2015   Procedure: Left Heart Cath and Cors/Grafts Angiography;  Surgeon: [name], MD;  Location: [facility];  Service: Cardiovascular;  Laterality: N/A;  . CARDIOVERSION  03/01/2012   Procedure: CARDIOVERSION;  Surgeon: Deboraha Sprang, MD;  Location: [facility];  Service: Cardiovascular;  Laterality: N/A;  . CATARACT EXTRACTION W/ INTRAOCULAR LENS  IMPLANT, BILATERAL  2011  . CORONARY ARTERY BYPASS GRAFT  1994   [place] ; CABG X1  . ELECTROPHYSIOLOGY STUDY N/A 02/28/2012   Procedure: ELECTROPHYSIOLOGY STUDY;  Surgeon: Deboraha Sprang, MD;  Location: [facility];  Service: Cardiovascular;  Laterality: N/A;  . INSERT / REPLACE / REMOVE PACEMAKER  02/28/12   "w/defibrillator"  . JOINT REPLACEMENT    . KNEE ARTHROSCOPY  2012   right  . LEFT HEART CATHETERIZATION WITH CORONARY/GRAFT ANGIOGRAM N/A 02/28/2012   Procedure: LEFT HEART CATHETERIZATION WITH [name];  Surgeon: [name], MD;  Location: [facility];  Service: Cardiovascular;  Laterality: N/A;  . TOTAL KNEE ARTHROPLASTY  2011   left     Medications Prior to Admission: Prior to Admission medications   Medication Sig Start Date End Date Taking? Authorizing Provider  carvedilol (COREG) 6.25 MG tablet TAKE ONE TABLET TWICE DAILY 11/29/17   [name], MD  Cinnamon 500 MG TABS Take 1,000 mg by mouth 2 (two) times daily.     [provider]  Coenzyme Q10 (CO Q 10 PO) Take 300 mg by mouth daily.    [provider]  enalapril (VASOTEC) 2.5 MG tablet Take 2.5 mg by mouth daily. 03/07/16   [provider]  eplerenone (INSPRA) 25 MG tablet TAKE 1/2 TABLET EVERY DAY 02/12/18   Deboraha Sprang, MD  folic acid (FOLVITE) 350 MCG tablet Take 400 mcg by mouth daily.    [provider]  furosemide (LASIX) 20 MG tablet TAKE ONE (1) TABLET BY MOUTH EVERY DAY 04/09/17   [name], MD  GARLIC PO Take 1 capsule by mouth daily.     [provider]  Glucosamine-Chondroit-Vit C-Mn (GLUCOSAMINE CHONDR 1500 COMPLX PO) Take 1,500 mg by mouth 2 (two) times daily.     [provider]  insulin NPH Human (HUMULIN N,NOVOLIN N) 100 UNIT/ML injection Inject 75 Units into the skin at bedtime.    [provider]  insulin regular (NOVOLIN R,HUMULIN R) 100 units/mL injection Inject 30 Units into the skin 3 (three) times daily before meals.     [provider]  levothyroxine (SYNTHROID, LEVOTHROID) 50 MCG tablet Take 50 mcg by mouth daily before breakfast.  04/05/17   [provider]  metFORMIN (GLUCOPHAGE) 500 MG tablet Take 0.5 mg tablet in the am with 500 mg in the pm.    [provider]  methotrexate (RHEUMATREX) 2.5 MG tablet Take by mouth as directed. Caution:Chemotherapy. Protect from light.    [provider]  Multiple Vitamin (MULTIVITAMIN) capsule Take 1 capsule by mouth daily.    [provider]  Omega-3 Fatty Acids (FISH OIL PO) Take by mouth 2 (two) times daily.     [provider]  pioglitazone (ACTOS) 15 MG tablet Take 15 mg by mouth daily.    [provider]  rosuvastatin (CRESTOR) 10 MG tablet Take 10 mg by mouth daily.    [provider]  XARELTO 20 MG TABS tablet TAKE ONE (1) TABLET EACH DAY 11/28/17   [name], MD       Allergies:   No Known Allergies  Social History:   Social History   Socioeconomic History  . Marital status: Married    Spouse name: Not on file  . Number of children: 3  . Years of education: Not on file  . Highest education level: Not on file  Occupational History  . Not on file  Social Needs  . Financial resource strain: Not on file  . Food insecurity:    Worry: Not on file    Inability: Not on file  . Transportation needs:    Medical: Not on file    Non-medical: Not on file  Tobacco Use  . Smoking status: Former Smoker  Packs/day: 1.00    Years: 25.00    Pack years: 25.00    Types: Cigarettes    Last attempt to quit: 01/09/1988    Years since quitting: 30.2  . Smokeless tobacco: Never Used  Substance and Sexual Activity  . Alcohol use: No  . Drug use: No  . Sexual activity: Never  Lifestyle  . Physical activity:    Days per week: Not on file    Minutes per session: Not on file  . Stress: Not on file  Relationships  . Social connections:    Talks on phone: Not on file    Gets together: Not on file    Attends religious service: Not on file    Active member of club or organization: Not on file    Attends meetings of clubs or organizations: Not on file    Relationship status: Not on file  . Intimate partner violence:    Fear of current or ex partner: Not on file    Emotionally abused: Not on file    Physically abused: Not on file    Forced sexual activity: Not on file  Other Topics Concern  . Not on file  Social History Narrative  . Not on file    Family History:   The patient's family history includes Heart attack in his brother and father.    ROS:  Please see the history of present illness.  All other ROS reviewed and negative.     Physical Exam/Data:    General:  Well nourished, well developed, in no acute distress HEENT: normal Lymph: no adenopathy Neck: no JVD Endocrine:  No thryomegaly Vascular: No carotid bruits  Cardiac:  iRRR;  no murmurs, gallops or rubs are appreciated Lungs:  CTA b/l, no wheezing, rhonchi or rales  Abd: soft, nontender  Ext: no edema Musculoskeletal:  No deformities Skin: warm and dry  Neuro:  No gross focal abnormalities noted Psych:  Normal affect    EKG: pending  Relevant CV Studies:  03/30/15: TTE Study Conclusions - Left ventricle: The cavity size was severely dilated. Wall   thickness was normal. Systolic function was moderately to   severely reduced. The estimated ejection fraction was in the   range of 30% to 35%. Diffuse hypokinesis. The study is not   technically sufficient to allow evaluation of LV diastolic   function. - Mitral valve: There was mild regurgitation. - Left atrium: The atrium was moderately dilated. - Right atrium: The atrium was mildly dilated. - Tricuspid valve: There was mild regurgitation. - Pulmonary arteries: Systolic pressure was within the normal   range.   11/08/15: LHC  Mid Cx lesion, 90% stenosed.  Ost 3rd Mrg to 3rd Mrg lesion, 90% stenosed.  SVG was injected .  Origin lesion, 100% stenosed.  There is severe left ventricular systolic dysfunction.   1. Significant one-vessel coronary artery disease involving distal left circumflex into OM 3. No obstructive disease involving the LAD/RCA. Occluded SVG to OM 3 which is new since most recent cardiac catheterization in 2013. 2. Severely reduced LV systolic function with an ejection fraction of 15% with global hypokinesis. 3. Mildly elevated left ventricular end-diastolic pressure. Recommendations: Continue aggressive medical therapy for coronary artery disease and heart failure. Cardiomyopathy is out of proportion to coronary artery disease.  I doubt there would be much benefit from revascularization of the distal left circumflex and OM 3 given that it supplies a relatively small territory and the area is diffusely diseased.  Laboratory Data:  ChemistryNo results for input(s): NA, K, CL, CO2,  GLUCOSE, BUN, CREATININE, CALCIUM, GFRNONAA, GFRAA, ANIONGAP in the last 168 hours.  No results for input(s): PROT, ALBUMIN, AST, ALT, ALKPHOS, BILITOT in the last 168 hours. HematologyNo results for input(s): WBC, RBC, HGB, HCT, MCV, MCH, MCHC, RDW, PLT in the last 168 hours. Cardiac EnzymesNo results for input(s): TROPONINI in the last 168 hours. No results for input(s): TROPIPOC in the last 168 hours.  BNPNo results for input(s): BNP, PROBNP in the last 168 hours.  DDimer No results for input(s): DDIMER in the last 168 hours.  Radiology/Studies:  No results found.  Assessment and Plan:   1. SJM Fortify Assura single chamber ICD     Early depletion battery warning alert received today     + hx of appropriate therapy in 2016 for VT   Planned for ICD generator change  will get labs/ekg done  Discussed the procedure, it's risks and benefits, he would like to proceed. He took his Xarelto this AM, will d/w Dr. Lovena Le though anticipate moving forward with pressure dressing post-procedure, and holding his xarelto for a couple days  Dr. Lovena Le to see   For questions or updates, please contact [facility] Please consult [email or web address] for contact info under Cardiology/STEMI.    Signed, Baldwin Jamaica, PA-C  03/18/2018 10:01 AM   EP Attending  Patient seen and examined. Agree with above. The patient's ICD prematurely depleted and present today for ICD generator removal and insertion of a new device. I have discussed the indications/risks/benefits/goals/expectations and he wishes to proceed.  Mikle Bosworth.D.

## 2018-03-18 NOTE — Telephone Encounter (Signed)
Spoke with pt informed him that Dr. Caryl Comes recommended pt go to the ED to have the device changed out, pt voiced understanding. Also called the cards on call to let them know that pt was on the way.

## 2018-03-19 ENCOUNTER — Encounter (HOSPITAL_COMMUNITY): Payer: Self-pay | Admitting: Internal Medicine

## 2018-03-19 MED FILL — Lidocaine HCl Local Inj 1%: INTRAMUSCULAR | Qty: 60 | Status: AC

## 2018-03-27 NOTE — H&P (Signed)
ICD Criteria  Current LVEF:30%. Within 12 months prior to implant: No   Heart failure history: Yes, Class II  Cardiomyopathy history: Yes, Ischemic Cardiomyopathy - Prior MI.  Atrial Fibrillation/Atrial Flutter: Yes, Persistent (> 7 Days).  Ventricular tachycardia history: Yes, Hemodynamic instability present. VT Type: Sustained Ventricular Tachycardia - Monomorphic.  Cardiac arrest history: No.  History of syndromes with risk of sudden death: No.  Previous ICD: Yes, Reason for ICD:  Secondary prevention.  Current ICD indication: Secondary  PPM indication: No.   Class I or II Bradycardia indication present: No  Beta Blocker therapy for 3 or more months: Yes, prescribed.   Ace Inhibitor/ARB therapy for 3 or more months: Yes, prescribed.

## 2018-03-28 ENCOUNTER — Ambulatory Visit (INDEPENDENT_AMBULATORY_CARE_PROVIDER_SITE_OTHER): Payer: Medicare Other | Admitting: *Deleted

## 2018-03-28 ENCOUNTER — Other Ambulatory Visit: Payer: Self-pay | Admitting: Cardiovascular Disease

## 2018-03-28 DIAGNOSIS — I472 Ventricular tachycardia, unspecified: Secondary | ICD-10-CM

## 2018-03-28 DIAGNOSIS — Z95810 Presence of automatic (implantable) cardiac defibrillator: Secondary | ICD-10-CM | POA: Diagnosis not present

## 2018-03-28 DIAGNOSIS — I255 Ischemic cardiomyopathy: Secondary | ICD-10-CM

## 2018-03-28 LAB — CUP PACEART INCLINIC DEVICE CHECK
Battery Remaining Longevity: 99 mo
Brady Statistic RV Percent Paced: 1.5 %
HighPow Impedance: 73.125
Implantable Lead Implant Date: 20130403
Implantable Lead Location: 753860
Implantable Pulse Generator Implant Date: 20190422
Lead Channel Pacing Threshold Amplitude: 1 V
Lead Channel Pacing Threshold Pulse Width: 0.5 ms
Lead Channel Setting Pacing Pulse Width: 0.5 ms
MDC IDC MSMT LEADCHNL RV IMPEDANCE VALUE: 400 Ohm
MDC IDC MSMT LEADCHNL RV SENSING INTR AMPL: 11.7 mV
MDC IDC PG SERIAL: 9811176
MDC IDC SESS DTM: 20190502112527
MDC IDC SET LEADCHNL RV PACING AMPLITUDE: 2.5 V
MDC IDC SET LEADCHNL RV SENSING SENSITIVITY: 0.5 mV

## 2018-03-28 NOTE — Progress Notes (Signed)
Wound check appointment. Steri-strips removed. Wound without redness or edema. Incision edges approximated, wound well healed. Normal device function. Thresholds, sensing, and impedances consistent with implant measurements. Device programmed at chronic output. Histogram distribution appropriate for patient and level of activity. Permanent AF, +Xarelto. No ventricular arrhythmias noted. Patient educated about wound care, arm mobility, lifting restrictions, shock plan. ROV with SK/B on 06/18/18.

## 2018-04-01 ENCOUNTER — Other Ambulatory Visit: Payer: Self-pay | Admitting: Cardiovascular Disease

## 2018-04-01 ENCOUNTER — Other Ambulatory Visit: Payer: Self-pay | Admitting: Internal Medicine

## 2018-04-01 NOTE — Telephone Encounter (Signed)
Please review for refill. Thanks!  

## 2018-04-29 DIAGNOSIS — Z96652 Presence of left artificial knee joint: Secondary | ICD-10-CM | POA: Insufficient documentation

## 2018-06-18 ENCOUNTER — Encounter: Payer: Self-pay | Admitting: Internal Medicine

## 2018-06-18 ENCOUNTER — Ambulatory Visit: Payer: Medicare Other | Admitting: Internal Medicine

## 2018-06-18 VITALS — BP 94/70 | HR 75 | Ht 70.0 in | Wt 224.5 lb

## 2018-06-18 DIAGNOSIS — I482 Chronic atrial fibrillation: Secondary | ICD-10-CM

## 2018-06-18 DIAGNOSIS — I5022 Chronic systolic (congestive) heart failure: Secondary | ICD-10-CM | POA: Diagnosis not present

## 2018-06-18 DIAGNOSIS — I472 Ventricular tachycardia, unspecified: Secondary | ICD-10-CM

## 2018-06-18 DIAGNOSIS — I428 Other cardiomyopathies: Secondary | ICD-10-CM

## 2018-06-18 DIAGNOSIS — I255 Ischemic cardiomyopathy: Secondary | ICD-10-CM | POA: Diagnosis not present

## 2018-06-18 DIAGNOSIS — Z95810 Presence of automatic (implantable) cardiac defibrillator: Secondary | ICD-10-CM | POA: Diagnosis not present

## 2018-06-18 DIAGNOSIS — I4821 Permanent atrial fibrillation: Secondary | ICD-10-CM

## 2018-06-18 LAB — CUP PACEART INCLINIC DEVICE CHECK
Battery Remaining Longevity: 98 mo
Date Time Interrogation Session: 20190723154401
HighPow Impedance: 85.5 Ohm
Implantable Lead Implant Date: 20130403
Implantable Lead Location: 753860
Implantable Pulse Generator Implant Date: 20190422
Lead Channel Pacing Threshold Amplitude: 1 V
Lead Channel Setting Pacing Pulse Width: 0.5 ms
Lead Channel Setting Sensing Sensitivity: 0.5 mV
MDC IDC MSMT LEADCHNL RV IMPEDANCE VALUE: 450 Ohm
MDC IDC MSMT LEADCHNL RV PACING THRESHOLD PULSEWIDTH: 0.5 ms
MDC IDC MSMT LEADCHNL RV SENSING INTR AMPL: 11.7 mV
MDC IDC PG SERIAL: 9811176
MDC IDC SET LEADCHNL RV PACING AMPLITUDE: 2.5 V
MDC IDC STAT BRADY RV PERCENT PACED: 2.3 %

## 2018-06-18 NOTE — Patient Instructions (Signed)
Medication Instructions: - Your physician recommends that you continue on your current medications as directed. Please refer to the Current Medication list given to you today.  Labwork: - none ordered  Procedures/Testing: - none ordered  Follow-Up: - Remote monitoring is used to monitor your Pacemaker of ICD from home. This monitoring reduces the number of office visits required to check your device to one time per year. It allows [place] to keep an eye on the functioning of your device to ensure it is working properly. You are scheduled for a device check from home on 09/17/18. You may send your transmission at any time that day. If you have a wireless device, the transmission will be sent automatically. After your physician reviews your transmission, you will receive a postcard with your next transmission date.  - Your physician wants you to follow-up in: 1 year with Dr. Caryl Comes. You will receive a reminder letter in the mail two months in advance. If you don't receive a letter, please call our office to schedule the follow-up appointment.   Any Additional Special Instructions Will Be Listed Below (If Applicable).     If you need a refill on your cardiac medications before your next appointment, please call your pharmacy.

## 2018-06-18 NOTE — Progress Notes (Signed)
Patient Care Team: [name], MD as PCP - General (Family Medicine)   HPI  Andres Gandy Sr. is a 77 y.o. male Seen in followup for ICD implanted for ischemic/nonischemic cardiomyopathy  and inducible ventricular tachycardia -April 2013 catheterization April 2013 demonstrating patent single vein graft to the OM. bypass had been  done emergently in 1994   EF was 25%  echo 2011 at [facility] was 14%  Repeat catheterization 12/16>>> occluded SVG to OM 3. The mid left circumflex had diffuse 90% disease into small OM3 which was also diffusely diseased and relatively small in size  EF 15%. Medical therapy was recommended    Appropriate Therapy yes  VT monomorphic 5/18 Cl 220--240   No chest pain.  Shortness of breath is stable.  He has had an intercurrent viral URI complicated by a long standing persistent but now gradually improving cough.  No edema.  No bleeding.   Date Cr K Hgb  4/18  1.4 5.2    4/19 0.36 4.2 14.3          He has permanent atrial fibrillation since April 2013.   On Rivaroxaban without bleeding issues Past Medical History:  Diagnosis Date  . Arthritis   . Atrial fibrillation, persistent-long-term   . Chronic systolic congestive heart failure ([place])    a. 03/2015 Echo: EF 30-35%, diff HK, mild MR, mod dil LA, mildly dil RA, mild TR.  [name] Coronary artery disease    a. 1994 s/p CABG x 1 (VG->OM3);  b. 10/2015 Cath: LM nl, LAD min irregs, D1 nl, D2 min irregs, LCX 24m, OM2 nl, OM3 90 (small territory), VG->OM3 100, RCA min irregs, RPDA/RPL1/RPL2/RPL3 nl, EF 25%-->Med Rx.  [name] Hyperlipidemia   . ICD (implantable cardiac defibrillator) in place    a. 02/2012 s/p SJM [id number] Fortify Assurance single lead AICD.  [name] Mixed Ischemic and Non-ischemic Cardiomyopathy    a. 07/2012 s/p SJM AICD (RV lead only);  b. 03/2015 Echo: EF 30-35%, diff HK;  c. EF 25% by LV gram.  . Nephrolithiasis 1970's  . Polymyalgia rheumatica (HCC)    On steroids  . Sleep apnea    On CPAP  .  Type II diabetes mellitus ([place])   . Ventricular tachycardia -inducible at EP testing    a. 07/2012 s/ SJM AICD.    Past Surgical History:  Procedure Laterality Date  . basel cell removal     left arm  . CARDIAC CATHETERIZATION  02/28/2012   Minor disease in LAD and RCA, LCX: 70% mid and occluded distally. Patent SVG to OM3  . CARDIAC CATHETERIZATION N/A 11/08/2015   Procedure: Left Heart Cath and Cors/Grafts Angiography;  Surgeon: [name], MD;  Location: [facility];  Service: Cardiovascular;  Laterality: N/A;  . CARDIOVERSION  03/01/2012   Procedure: CARDIOVERSION;  Surgeon: Deboraha Sprang, MD;  Location: [facility];  Service: Cardiovascular;  Laterality: N/A;  . CATARACT EXTRACTION W/ INTRAOCULAR LENS  IMPLANT, BILATERAL  2011  . CORONARY ARTERY BYPASS GRAFT  1994   [place] ; CABG X1  . ELECTROPHYSIOLOGY STUDY N/A 02/28/2012   Procedure: ELECTROPHYSIOLOGY STUDY;  Surgeon: Deboraha Sprang, MD;  Location: [facility] CATH LAB;  Service: Cardiovascular;  Laterality: N/A;  . ICD GENERATOR CHANGEOUT N/A 03/18/2018   Procedure: [place];  Surgeon: Evans Lance, MD;  Location: [facility];  Service: Cardiovascular;  Laterality: N/A;  . INSERT / REPLACE / REMOVE PACEMAKER  02/28/12   "  w/defibrillator"  . JOINT REPLACEMENT    . KNEE ARTHROSCOPY  2012   right  . LEFT HEART CATHETERIZATION WITH CORONARY/GRAFT ANGIOGRAM N/A 02/28/2012   Procedure: LEFT HEART CATHETERIZATION WITH [name];  Surgeon: [name], MD;  Location: [facility] CATH LAB;  Service: Cardiovascular;  Laterality: N/A;  . TOTAL KNEE ARTHROPLASTY  2011   left    Current Outpatient Medications  Medication Sig Dispense Refill  . carvedilol (COREG) 6.25 MG tablet TAKE ONE TABLET TWICE DAILY 60 tablet 6  . Cinnamon 500 MG TABS Take 1,000 mg by mouth 2 (two) times daily.     . Coenzyme Q10 (CO Q 10 PO) Take 300 mg by mouth daily.    . cyclobenzaprine (FLEXERIL) 5 MG tablet Take 5 mg by mouth 3  (three) times daily as needed for muscle spasms.    . diphenhydramine-acetaminophen (TYLENOL PM) 25-500 MG TABS tablet Take 2 tablets by mouth at bedtime as needed.    . enalapril (VASOTEC) 2.5 MG tablet Take 2.5 mg by mouth daily.    [name] eplerenone (INSPRA) 25 MG tablet TAKE 1/2 TABLET EVERY DAY 30 tablet 3  . fluticasone (FLONASE) 50 MCG/ACT nasal spray Place into the nose daily.    . folic acid (FOLVITE) 681 MCG tablet Take 400 mcg by mouth daily.    . furosemide (LASIX) 20 MG tablet TAKE ONE TABLET EVERY DAY 90 tablet 3  . GARLIC PO Take 1 capsule by mouth daily.     . Glucosamine-Chondroit-Vit C-Mn (GLUCOSAMINE CHONDR 1500 COMPLX PO) Take 1,500 mg by mouth 2 (two) times daily.     . insulin NPH Human (HUMULIN N,NOVOLIN N) 100 UNIT/ML injection Inject 95 Units into the skin at bedtime.     . insulin regular (NOVOLIN R,HUMULIN R) 100 units/mL injection Inject 30 Units into the skin 3 (three) times daily before meals.     [name] levothyroxine (SYNTHROID, LEVOTHROID) 50 MCG tablet Take 50 mcg by mouth daily before breakfast.     . metFORMIN (GLUCOPHAGE) 500 MG tablet Take 500 mg tablet in the am with 0.5 tablet in the pm.    . methotrexate (RHEUMATREX) 2.5 MG tablet Take by mouth as directed. Caution:Chemotherapy. Protect from light.    . Omega-3 Fatty Acids (FISH OIL PO) Take by mouth 2 (two) times daily.     [name] omeprazole (PRILOSEC) 20 MG capsule Take by mouth daily.    . pioglitazone (ACTOS) 15 MG tablet Take 15 mg by mouth daily.    . rosuvastatin (CRESTOR) 10 MG tablet Take 10 mg by mouth daily.    Alveda Reasons 20 MG TABS tablet TAKE ONE TABLET EVERY DAY 30 tablet 3  . predniSONE (DELTASONE) 10 MG tablet daily.     No current facility-administered medications for this visit.     No Known Allergies  Review of Systems negative except from HPI and PMH  Physical Exam BP 94/70 (BP Location: Left Arm, Patient Position: Sitting, Cuff Size: Normal)   Pulse 75   Ht 5\' 10"  (1.778 m)   Wt 224 lb 8 oz  (101.8 kg)   BMI 32.21 kg/m  Well developed and nourished in no acute distress HENT normal Neck supple with JVP-flat Carotids brisk and full without bruits Clear Irregularly irregular rate and rhythm with controlled ventricular response, no murmurs or gallops Abd-soft with active BS without hepatomegaly No Clubbing cyanosis edema Skin-warm and dry A & Oriented  Grossly normal sensory and motor function    ECG personally reviewed atrial  fibrillation at 16 Intervals-/11/41 Low voltage  Assessment and  Plan  Congestive heart failure-chronic-systolic  Atrial fibrillation-permanent  Cardiomyopathy ischemic/nonischemic  Orthostatic intolerance-improved  High Risk Medication Surveillance   Ventricular tachycardia -monomorphic-rapid  ICD single chamber-St. Jude The patient's device was interrogated.  The information was reviewed. No changes were made in the programming.      Has had interval lightheadedness without demonstrable arrhythmia; baseline low blood pressure.  If he has symptomatic lightheadedness will decrease carvedilol 6.25--3.  125 and consider the addition of ranolazine as his ventricular tachycardia was thought to be ischemically triggered  No interval arrhythmias.  No interval ventricular tachycardia.  On anticoagulation without bleeding. . We spent more than 50% of our >25 min visit in face to face counseling regarding the above

## 2018-09-17 ENCOUNTER — Telehealth: Payer: Self-pay | Admitting: Cardiology

## 2018-09-17 ENCOUNTER — Ambulatory Visit: Payer: Medicare Other | Admitting: *Deleted

## 2018-09-17 NOTE — Telephone Encounter (Signed)
LMOVM reminding pt to send remote transmission.   

## 2018-09-17 NOTE — Progress Notes (Signed)
Remote ICD transmission.   

## 2018-09-18 ENCOUNTER — Encounter: Payer: Self-pay | Admitting: Cardiology

## 2018-09-19 ENCOUNTER — Ambulatory Visit (INDEPENDENT_AMBULATORY_CARE_PROVIDER_SITE_OTHER): Payer: Medicare Other | Admitting: *Deleted

## 2018-09-19 DIAGNOSIS — I255 Ischemic cardiomyopathy: Secondary | ICD-10-CM

## 2018-09-19 DIAGNOSIS — I5022 Chronic systolic (congestive) heart failure: Secondary | ICD-10-CM

## 2018-09-19 NOTE — Progress Notes (Signed)
Not received. Encounter closed.

## 2018-09-20 NOTE — Progress Notes (Signed)
Remote ICD transmission.   

## 2018-10-11 LAB — CUP PACEART REMOTE DEVICE CHECK
Battery Remaining Longevity: 94 mo
Battery Voltage: 3.17 V
Brady Statistic RV Percent Paced: 2.2 %
HighPow Impedance: 82 Ohm
HighPow Impedance: 82 Ohm
Implantable Lead Implant Date: 20130403
Implantable Lead Location: 753860
Lead Channel Pacing Threshold Amplitude: 1 V
Lead Channel Pacing Threshold Pulse Width: 0.5 ms
Lead Channel Setting Sensing Sensitivity: 0.5 mV
MDC IDC MSMT BATTERY REMAINING PERCENTAGE: 92 %
MDC IDC MSMT LEADCHNL RV IMPEDANCE VALUE: 400 Ohm
MDC IDC MSMT LEADCHNL RV SENSING INTR AMPL: 11.7 mV
MDC IDC PG IMPLANT DT: 20190422
MDC IDC PG SERIAL: 9811176
MDC IDC SESS DTM: 20191025023836
MDC IDC SET LEADCHNL RV PACING AMPLITUDE: 2.5 V
MDC IDC SET LEADCHNL RV PACING PULSEWIDTH: 0.5 ms

## 2018-12-19 ENCOUNTER — Ambulatory Visit (INDEPENDENT_AMBULATORY_CARE_PROVIDER_SITE_OTHER): Payer: Medicare Other

## 2018-12-19 DIAGNOSIS — I5022 Chronic systolic (congestive) heart failure: Secondary | ICD-10-CM

## 2018-12-19 DIAGNOSIS — I255 Ischemic cardiomyopathy: Secondary | ICD-10-CM

## 2018-12-20 ENCOUNTER — Encounter: Payer: Self-pay | Admitting: Cardiology

## 2018-12-20 NOTE — Progress Notes (Signed)
Remote ICD transmission.   

## 2018-12-22 LAB — CUP PACEART REMOTE DEVICE CHECK
Battery Remaining Longevity: 92 mo
Battery Remaining Percentage: 91 %
HighPow Impedance: 82 Ohm
HighPow Impedance: 82 Ohm
Implantable Pulse Generator Implant Date: 20190422
Lead Channel Impedance Value: 410 Ohm
Lead Channel Setting Pacing Pulse Width: 0.5 ms
Lead Channel Setting Sensing Sensitivity: 0.5 mV
MDC IDC LEAD IMPLANT DT: 20130403
MDC IDC LEAD LOCATION: 753860
MDC IDC MSMT BATTERY VOLTAGE: 3.11 V
MDC IDC MSMT LEADCHNL RV PACING THRESHOLD AMPLITUDE: 1 V
MDC IDC MSMT LEADCHNL RV PACING THRESHOLD PULSEWIDTH: 0.5 ms
MDC IDC MSMT LEADCHNL RV SENSING INTR AMPL: 11.7 mV
MDC IDC PG SERIAL: 9811176
MDC IDC SESS DTM: 20200121070016
MDC IDC SET LEADCHNL RV PACING AMPLITUDE: 2.5 V
MDC IDC STAT BRADY RV PERCENT PACED: 2.6 %

## 2019-03-12 DIAGNOSIS — Z85828 Personal history of other malignant neoplasm of skin: Secondary | ICD-10-CM

## 2019-03-20 ENCOUNTER — Ambulatory Visit (INDEPENDENT_AMBULATORY_CARE_PROVIDER_SITE_OTHER): Payer: Medicare Other | Admitting: *Deleted

## 2019-03-20 ENCOUNTER — Other Ambulatory Visit: Payer: Self-pay

## 2019-03-20 DIAGNOSIS — I5022 Chronic systolic (congestive) heart failure: Secondary | ICD-10-CM

## 2019-03-20 DIAGNOSIS — I255 Ischemic cardiomyopathy: Secondary | ICD-10-CM

## 2019-03-20 LAB — CUP PACEART REMOTE DEVICE CHECK
Battery Remaining Longevity: 91 mo
Battery Remaining Percentage: 89 %
Battery Voltage: 3.08 V
Brady Statistic RV Percent Paced: 3 %
Date Time Interrogation Session: 20200421060016
HighPow Impedance: 80 Ohm
HighPow Impedance: 80 Ohm
Implantable Lead Implant Date: 20130403
Implantable Lead Location: 753860
Implantable Pulse Generator Implant Date: 20190422
Lead Channel Impedance Value: 410 Ohm
Lead Channel Sensing Intrinsic Amplitude: 11.7 mV
Lead Channel Setting Pacing Amplitude: 2.5 V
Lead Channel Setting Pacing Pulse Width: 0.5 ms
Lead Channel Setting Sensing Sensitivity: 0.5 mV
Pulse Gen Serial Number: 9811176

## 2019-03-21 ENCOUNTER — Other Ambulatory Visit: Payer: Self-pay | Admitting: Cardiovascular Disease

## 2019-03-27 ENCOUNTER — Other Ambulatory Visit: Payer: Self-pay | Admitting: Internal Medicine

## 2019-03-28 ENCOUNTER — Encounter: Payer: Self-pay | Admitting: Cardiology

## 2019-03-28 NOTE — Progress Notes (Signed)
Remote ICD transmission.   

## 2019-04-22 ENCOUNTER — Telehealth: Payer: Self-pay

## 2019-04-22 NOTE — Telephone Encounter (Signed)
Called patient from recall.  Patient would like to wait for an in office appointment.  Patient states he is having no symptoms at this time.  New recall placed for Aug.

## 2019-06-23 ENCOUNTER — Other Ambulatory Visit: Payer: Self-pay | Admitting: Cardiovascular Disease

## 2019-06-24 ENCOUNTER — Encounter: Payer: Medicare Other | Admitting: Internal Medicine

## 2019-06-25 ENCOUNTER — Encounter: Payer: Medicare Other | Admitting: *Deleted

## 2019-06-26 ENCOUNTER — Telehealth: Payer: Self-pay

## 2019-06-26 NOTE — Telephone Encounter (Signed)
Left message for patient to remind of missed remote transmission.  

## 2019-07-02 ENCOUNTER — Other Ambulatory Visit: Payer: Self-pay | Admitting: Internal Medicine

## 2019-07-03 ENCOUNTER — Encounter: Payer: Self-pay | Admitting: Cardiovascular Disease

## 2019-07-03 ENCOUNTER — Other Ambulatory Visit: Payer: Self-pay

## 2019-07-03 ENCOUNTER — Ambulatory Visit (INDEPENDENT_AMBULATORY_CARE_PROVIDER_SITE_OTHER): Payer: Medicare Other | Admitting: Cardiovascular Disease

## 2019-07-03 VITALS — BP 98/62 | HR 73 | Ht 70.0 in | Wt 220.0 lb

## 2019-07-03 DIAGNOSIS — E785 Hyperlipidemia, unspecified: Secondary | ICD-10-CM

## 2019-07-03 DIAGNOSIS — I482 Chronic atrial fibrillation, unspecified: Secondary | ICD-10-CM

## 2019-07-03 DIAGNOSIS — I5022 Chronic systolic (congestive) heart failure: Secondary | ICD-10-CM | POA: Diagnosis not present

## 2019-07-03 DIAGNOSIS — I2510 Atherosclerotic heart disease of native coronary artery without angina pectoris: Secondary | ICD-10-CM

## 2019-07-03 NOTE — Progress Notes (Signed)
Cardiology Office Note   Date:  07/03/2019   ID:  Andres Gandy Sr., DOB May 21, 1941, MRN [id number]  PCP:  [name], MD  Cardiologist:   Kathlyn Sacramento, MD   Chief Complaint  Patient presents with  . other    OD 6 month f/u c/o when talking voice tends to give out. Meds reviewed verbally with pt.      History of Present Illness: Andres MCEUEN Sr. is a 78 y.o. male who presents for a follow up regarding chronic systolic heart failure, chronic atrial fibrillation and coronary artery disease.   He had previous one-vessel CABG in 1994 for angioplasty in the left circumflex complicated by dissection. Cardiac catheterization in December 2016 showed occluded SVG to OM 3. The mid left circumflex had diffuse 90% disease into small OM3 which was also diffusely diseased and relatively small in size. EF was 15% with mildly elevated LVEDP. I recommended medical therapy. His blood pressure tends to be on the low side which has limited up titration of antihypertensive medications. He had previous ventricular tachycardia and syncope that was successfully treated with ICD shock with subsequent adjustment of his device to deliver ATP.  No recent EF evaluation. He had ICD generator replacement in April 2019.  He has been doing reasonably well and denies any chest pain.  He has stable exertional dyspnea with fatigue but no orthopnea, PND or leg edema.  Past Medical History:  Diagnosis Date  . Arthritis   . Atrial fibrillation, persistent-long-term   . Chronic systolic congestive heart failure ([place])    a. 03/2015 Echo: EF 30-35%, diff HK, mild MR, mod dil LA, mildly dil RA, mild TR.  [name] Coronary artery disease    a. 1994 s/p CABG x 1 (VG->OM3);  b. 10/2015 Cath: LM nl, LAD min irregs, D1 nl, D2 min irregs, LCX 47m, OM2 nl, OM3 90 (small territory), VG->OM3 100, RCA min irregs, RPDA/RPL1/RPL2/RPL3 nl, EF 25%-->Med Rx.  [name] Hx of squamous cell carcinoma of skin 06/04/2013   L upper arm  .  Hyperlipidemia   . ICD (implantable cardiac defibrillator) in place    a. 02/2012 s/p SJM 1257-40Q Fortify Assurance single lead AICD.  [name] Mixed Ischemic and Non-ischemic Cardiomyopathy    a. 07/2012 s/p SJM AICD (RV lead only);  b. 03/2015 Echo: EF 30-35%, diff HK;  c. EF 25% by LV gram.  . Nephrolithiasis 1970's  . Polymyalgia rheumatica (HCC)    On steroids  . Sleep apnea    On CPAP  . Type II diabetes mellitus ([place])   . Ventricular tachycardia -inducible at EP testing    a. 07/2012 s/ SJM AICD.    Past Surgical History:  Procedure Laterality Date  . basel cell removal     left arm  . CARDIAC CATHETERIZATION  02/28/2012   Minor disease in LAD and RCA, LCX: 70% mid and occluded distally. Patent SVG to OM3  . CARDIAC CATHETERIZATION N/A 11/08/2015   Procedure: Left Heart Cath and Cors/Grafts Angiography;  Surgeon: [name], MD;  Location: [place] [facility];  Service: Cardiovascular;  Laterality: N/A;  . CARDIOVERSION  03/01/2012   Procedure: CARDIOVERSION;  Surgeon: Deboraha Sprang, MD;  Location: [place];  Service: Cardiovascular;  Laterality: N/A;  . CATARACT EXTRACTION W/ INTRAOCULAR LENS  IMPLANT, BILATERAL  2011  . CORONARY ARTERY BYPASS GRAFT  1994   [place] ; CABG X1  . ELECTROPHYSIOLOGY STUDY N/A 02/28/2012   Procedure: ELECTROPHYSIOLOGY STUDY;  Surgeon: Remo Lipps  Peterson Lombard, MD;  Location[facility];  Service: Cardiovascular;  Laterality: N/A;  . ICD GENERATOR CHANGEOUT N/A 03/18/2018   Procedure: [place];  Surgeon: Evans Lance, MD;  Location: [place] [facility];  Service: Cardiovascular;  Laterality: N/A;  . INSERT / REPLACE / REMOVE PACEMAKER  02/28/12   "w/defibrillator"  . JOINT REPLACEMENT    . KNEE ARTHROSCOPY  2012   right  . LEFT HEART CATHETERIZATION WITH CORONARY/GRAFT ANGIOGRAM N/A 02/28/2012   Procedure: LEFT HEART CATHETERIZATION WITH [name];  Surgeon: [name], MD;  Location: [place] CATH LAB;  Service: Cardiovascular;   Laterality: N/A;  . TOTAL KNEE ARTHROPLASTY  2011   left     Current Outpatient Medications  Medication Sig Dispense Refill  . carvedilol (COREG) 6.25 MG tablet TAKE ONE TABLET TWICE DAILY 60 tablet 6  . Cinnamon 500 MG TABS Take 1,000 mg by mouth 2 (two) times daily.     . Coenzyme Q10 (CO Q 10 PO) Take 300 mg by mouth daily.    . cyclobenzaprine (FLEXERIL) 5 MG tablet Take 5 mg by mouth 3 (three) times daily as needed for muscle spasms.    . diphenhydramine-acetaminophen (TYLENOL PM) 25-500 MG TABS tablet Take 2 tablets by mouth at bedtime as needed.    . enalapril (VASOTEC) 2.5 MG tablet Take 2.5 mg by mouth daily.    [name] eplerenone (INSPRA) 25 MG tablet ONE-HALF TABLET BY MOUTH EVERY DAY 45 tablet 0  . fluticasone (FLONASE) 50 MCG/ACT nasal spray Place into the nose daily.    . folic acid (FOLVITE) 833 MCG tablet Take 400 mcg by mouth daily.    . furosemide (LASIX) 20 MG tablet TAKE ONE TABLET BY MOUTH EVERY DAY 30 tablet 0  . GARLIC PO Take 1 capsule by mouth daily.     . Glucosamine-Chondroit-Vit C-Mn (GLUCOSAMINE CHONDR 1500 COMPLX PO) Take 1,500 mg by mouth 2 (two) times daily.     . insulin NPH Human (HUMULIN N,NOVOLIN N) 100 UNIT/ML injection Inject 95 Units into the skin at bedtime.     . insulin regular (NOVOLIN R,HUMULIN R) 100 units/mL injection Inject 30 Units into the skin 3 (three) times daily before meals.     [name] levothyroxine (SYNTHROID, LEVOTHROID) 50 MCG tablet Take 50 mcg by mouth daily before breakfast.     . metFORMIN (GLUCOPHAGE) 500 MG tablet Take 500 mg tablet in the am with 0.5 tablet in the pm.    . methotrexate (RHEUMATREX) 2.5 MG tablet Take by mouth as directed. Caution:Chemotherapy. Protect from light.    . Omega-3 Fatty Acids (FISH OIL PO) Take by mouth 2 (two) times daily.     [name] omeprazole (PRILOSEC) 20 MG capsule Take by mouth daily.    . pioglitazone (ACTOS) 15 MG tablet Take 15 mg by mouth daily.    . rosuvastatin (CRESTOR) 10 MG tablet Take 10 mg by  mouth daily.    Alveda Reasons 20 MG TABS tablet TAKE ONE TABLET EVERY DAY 30 tablet 3   No current facility-administered medications for this visit.     Allergies:   Patient has no known allergies.    Social History:  The patient  reports that he quit smoking about 31 years ago. His smoking use included cigarettes. He has a 25.00 pack-year smoking history. He has never used smokeless tobacco. He reports that he does not drink alcohol or use drugs.   Family History:  The patient's family history includes Heart attack  in his brother and father.    ROS:  Please see the history of present illness.   Otherwise, review of systems are positive for none.   All other systems are reviewed and negative.    PHYSICAL EXAM: VS:  BP 98/62 (BP Location: Right Arm, Patient Position: Sitting, Cuff Size: Normal)   Pulse 73   Ht 5\' 10"  (1.778 m)   Wt 220 lb (99.8 kg)   BMI 31.57 kg/m  , BMI Body mass index is 31.57 kg/m. GEN: Well nourished, well developed, in no acute distress  HEENT: normal  Neck: no JVD, carotid bruits, or masses Cardiac: Irregularly irregular; no murmurs, rubs, or gallops,no edema  Respiratory:  clear to auscultation bilaterally, normal work of breathing GI: soft, nontender, nondistended, + BS MS: no deformity or atrophy  Skin: warm and dry, no rash Neuro:  Strength and sensation are intact Psych: euthymic mood, full affect   EKG:  EKG is ordered today. The ekg ordered today demonstrates atrial fibrillation with  Low voltage.  Poor R wave progression in the precordial leads.  Recent Labs: No results found for requested labs within last 8760 hours.    Lipid Panel No results found for: CHOL, TRIG, HDL, CHOLHDL, VLDL, LDLCALC, LDLDIRECT    Wt Readings from Last 3 Encounters:  07/03/19 220 lb (99.8 kg)  06/18/18 224 lb 8 oz (101.8 kg)  03/18/18 230 lb (104.3 kg)      ASSESSMENT AND PLAN:  1.  Chronic systolic heart failure: Most recent ejection fraction was 15%.  He  appears to be euvolemic.  Continue treatment with carvedilol, enalapril and eplerenone. His blood pressure is too low to allow Entresto. No recent EF evaluation in the last 4 years.  I requested an echocardiogram.  2. Coronary artery disease: Mainly affecting the left circumflex supplying a small territory.  Continue medical therapy.  3. Chronic atrial fibrillation: Ventricular rate is well controlled. Continue anticoagulation with Xarelto.  I reviewed his labs done in April which showed stable renal function and CBC.  4. Diabetes mellitus:  Managed by primary care physician  5. Hyperlipidemia: He is not able to tolerate higher doses of statins but is able to take small dose rosuvastatin.   Disposition:   FU with me in 6 months  Signed,  Kathlyn Sacramento, MD  07/03/2019 9:25 AM    [place]

## 2019-07-03 NOTE — Patient Instructions (Signed)
Medication Instructions:  Your physician recommends that you continue on your current medications as directed. Please refer to the Current Medication list given to you today.  If you need a refill on your cardiac medications before your next appointment, please call your pharmacy.   Lab work: None ordered If you have labs (blood work) drawn today and your tests are completely normal, you will receive your results only by: [name] MyChart Message (if you have MyChart) OR . A paper copy in the mail If you have any lab test that is abnormal or we need to change your treatment, we will call you to review the results.  Testing/Procedures: Your physician has requested that you have an echocardiogram. Echocardiography is a painless test that uses sound waves to create images of your heart. It provides your doctor with information about the size and shape of your heart and how well your heart's chambers and valves are working. This procedure takes approximately one hour. There are no restrictions for this procedure.    Follow-Up: At [facility], you and your health needs are our priority.  As part of our continuing mission to provide you with exceptional heart care, we have created designated Provider Care Teams.  These Care Teams include your primary Cardiologist (physician) and Advanced Practice Providers (APPs -  Physician Assistants and Nurse Practitioners) who all work together to provide you with the care you need, when you need it. You will need a follow up appointment in 6 months.  Please call our office 2 months in advance to schedule this appointment.  You may see  Dr.Arida or one of the following Advanced Practice Providers on your designated Care Team:   Murray Hodgkins, NP Christell Faith, PA-C . Marrianne Mood, PA-C

## 2019-07-18 ENCOUNTER — Ambulatory Visit (INDEPENDENT_AMBULATORY_CARE_PROVIDER_SITE_OTHER): Payer: Medicare Other | Admitting: *Deleted

## 2019-07-18 DIAGNOSIS — I4819 Other persistent atrial fibrillation: Secondary | ICD-10-CM

## 2019-07-18 DIAGNOSIS — I5022 Chronic systolic (congestive) heart failure: Secondary | ICD-10-CM

## 2019-07-22 ENCOUNTER — Ambulatory Visit (INDEPENDENT_AMBULATORY_CARE_PROVIDER_SITE_OTHER): Payer: Medicare Other | Admitting: Internal Medicine

## 2019-07-22 ENCOUNTER — Other Ambulatory Visit: Payer: Self-pay | Admitting: Cardiovascular Disease

## 2019-07-22 ENCOUNTER — Encounter: Payer: Self-pay | Admitting: Internal Medicine

## 2019-07-22 ENCOUNTER — Other Ambulatory Visit: Payer: Self-pay

## 2019-07-22 VITALS — BP 118/72 | HR 74 | Ht 70.0 in | Wt 218.0 lb

## 2019-07-22 DIAGNOSIS — I472 Ventricular tachycardia, unspecified: Secondary | ICD-10-CM

## 2019-07-22 DIAGNOSIS — Z95810 Presence of automatic (implantable) cardiac defibrillator: Secondary | ICD-10-CM

## 2019-07-22 DIAGNOSIS — I428 Other cardiomyopathies: Secondary | ICD-10-CM

## 2019-07-22 DIAGNOSIS — I5022 Chronic systolic (congestive) heart failure: Secondary | ICD-10-CM | POA: Diagnosis not present

## 2019-07-22 DIAGNOSIS — I255 Ischemic cardiomyopathy: Secondary | ICD-10-CM | POA: Diagnosis not present

## 2019-07-22 DIAGNOSIS — I4821 Permanent atrial fibrillation: Secondary | ICD-10-CM

## 2019-07-22 LAB — CUP PACEART REMOTE DEVICE CHECK
Battery Remaining Longevity: 89 mo
Battery Remaining Percentage: 87 %
Battery Voltage: 3.02 V
Brady Statistic RV Percent Paced: 3.7 %
Date Time Interrogation Session: 20200822115840
HighPow Impedance: 84 Ohm
HighPow Impedance: 84 Ohm
Implantable Lead Implant Date: 20130403
Implantable Lead Location: 753860
Implantable Pulse Generator Implant Date: 20190422
Lead Channel Impedance Value: 440 Ohm
Lead Channel Pacing Threshold Amplitude: 1 V
Lead Channel Pacing Threshold Pulse Width: 0.5 ms
Lead Channel Sensing Intrinsic Amplitude: 11.7 mV
Lead Channel Setting Pacing Amplitude: 2.5 V
Lead Channel Setting Pacing Pulse Width: 0.5 ms
Lead Channel Setting Sensing Sensitivity: 0.5 mV
Pulse Gen Serial Number: 9811176

## 2019-07-22 NOTE — Progress Notes (Signed)
Patient Care Team: [name], MD as PCP - General (Family Medicine)   HPI  Andres Gandy Sr. is a 78 y.o. male Seen in followup for ICD implanted for ischemic/nonischemic cardiomyopathy  and inducible ventricular tachycardia -April 2013 catheterization April 2013 demonstrating patent single vein graft to the OM. bypass had been  done emergently in [place] generator replacement 4/19 (GT)  EF was 25%  echo 2011 [facility] was 14%  Repeat catheterization 12/16>>> occluded SVG to OM 3. The mid left circumflex had diffuse 90% disease into small OM3 which was also diffusely diseased and relatively small in size  EF 15%. Medical therapy was recommended Repeat Echo pending next week   Appropriate Therapy yes  VT monomorphic 5/18 Cl 220--240  No interval arrhythmias of which he is aware.  Minimal dyspnea with significant exertion.  No chest discomfort.  No orthopnea nocturnal dyspnea or peripheral edema.  Orthostatic intolerance.  Some type of presyncope.  Longstanding hypertension and diabetes.  Elevated MCV.  B12 normal 11/19     Date Cr K Hgb  4/18  1.4 5.2    4/19 0.36 4.2 14.3  5/20 1.3 4.8 (2/20) 15.6     He has permanent atrial fibrillation since April 2013.   On Rivaroxaban without bleeding issues Past Medical History:  Diagnosis Date  . Arthritis   . Atrial fibrillation, persistent-long-term   . Chronic systolic congestive heart failure ([place])    a. 03/2015 Echo: EF 30-35%, diff HK, mild MR, mod dil LA, mildly dil RA, mild TR.  [name] Coronary artery disease    a. 1994 s/p CABG x 1 (VG->OM3);  b. 10/2015 Cath: LM nl, LAD min irregs, D1 nl, D2 min irregs, LCX 10m, OM2 nl, OM3 90 (small territory), VG->OM3 100, RCA min irregs, RPDA/RPL1/RPL2/RPL3 nl, EF 25%-->Med Rx.  [name] Hx of squamous cell carcinoma of skin 06/04/2013   L upper arm  . Hyperlipidemia   . ICD (implantable cardiac defibrillator) in place    a. 02/2012 s/p SJM 1257-40Q Fortify Assurance single  lead AICD.  [name] Mixed Ischemic and Non-ischemic Cardiomyopathy    a. 07/2012 s/p SJM AICD (RV lead only);  b. 03/2015 Echo: EF 30-35%, diff HK;  c. EF 25% by LV gram.  . Nephrolithiasis 1970's  . Polymyalgia rheumatica (HCC)    On steroids  . Sleep apnea    On CPAP  . Type II diabetes mellitus ([place])   . Ventricular tachycardia -inducible at EP testing    a. 07/2012 s/ SJM AICD.    Past Surgical History:  Procedure Laterality Date  . basel cell removal     left arm  . CARDIAC CATHETERIZATION  02/28/2012   Minor disease in LAD and RCA, LCX: 70% mid and occluded distally. Patent SVG to OM3  . CARDIAC CATHETERIZATION N/A 11/08/2015   Procedure: Left Heart Cath and Cors/Grafts Angiography;  Surgeon: [name], MD;  Location: [place] [facility];  Service: Cardiovascular;  Laterality: N/A;  . CARDIOVERSION  03/01/2012   Procedure: CARDIOVERSION;  Surgeon: Deboraha Sprang, MD;  Location: [place];  Service: Cardiovascular;  Laterality: N/A;  . CATARACT EXTRACTION W/ INTRAOCULAR LENS  IMPLANT, BILATERAL  2011  . CORONARY ARTERY BYPASS GRAFT  1994   [place] ; CABG X1  . ELECTROPHYSIOLOGY STUDY N/A 02/28/2012   Procedure: ELECTROPHYSIOLOGY STUDY;  Surgeon: Deboraha Sprang, MD;  Location: [facility] CATH LAB;  Service: Cardiovascular;  Laterality: N/A;  . ICD  GENERATOR CHANGEOUT N/A 03/18/2018   Procedure: ICD GENERATOR CHANGEOUT;  Surgeon: Evans Lance, MD;  Location: [place] CV LAB;  Service: Cardiovascular;  Laterality: N/A;  . INSERT / REPLACE / REMOVE PACEMAKER  02/28/12   "w/defibrillator"  . JOINT REPLACEMENT    . KNEE ARTHROSCOPY  2012   right  . LEFT HEART CATHETERIZATION WITH CORONARY/GRAFT ANGIOGRAM N/A 02/28/2012   Procedure: LEFT HEART CATHETERIZATION WITH [name];  Surgeon: [name], MD;  Location: [place] CATH LAB;  Service: Cardiovascular;  Laterality: N/A;  . TOTAL KNEE ARTHROPLASTY  2011   left    Current Outpatient Medications  Medication Sig Dispense Refill  .  carvedilol (COREG) 6.25 MG tablet TAKE ONE TABLET TWICE DAILY 60 tablet 6  . Cinnamon 500 MG TABS Take 1,000 mg by mouth 2 (two) times daily.     . Coenzyme Q10 (CO Q 10 PO) Take 300 mg by mouth daily.    . cyclobenzaprine (FLEXERIL) 5 MG tablet Take 5 mg by mouth 3 (three) times daily as needed for muscle spasms.    . diphenhydramine-acetaminophen (TYLENOL PM) 25-500 MG TABS tablet Take 2 tablets by mouth at bedtime as needed.    . enalapril (VASOTEC) 2.5 MG tablet Take 2.5 mg by mouth daily.    [name] eplerenone (INSPRA) 25 MG tablet ONE-HALF TABLET BY MOUTH EVERY DAY 45 tablet 1  . fluticasone (FLONASE) 50 MCG/ACT nasal spray Place into the nose daily.    . folic acid (FOLVITE) [id number] MCG tablet Take 400 mcg by mouth daily.    . furosemide (LASIX) 20 MG tablet TAKE ONE TABLET BY MOUTH EVERY DAY 30 tablet 0  . GARLIC PO Take 1 capsule by mouth daily.     . Glucosamine-Chondroit-Vit C-Mn (GLUCOSAMINE CHONDR 1500 COMPLX PO) Take 1,500 mg by mouth 2 (two) times daily.     . insulin NPH Human (HUMULIN N,NOVOLIN N) 100 UNIT/ML injection Inject 95 Units into the skin at bedtime.     . insulin regular (NOVOLIN R,HUMULIN R) 100 units/mL injection Inject 30 Units into the skin 3 (three) times daily before meals.     [name] levothyroxine (SYNTHROID, LEVOTHROID) 50 MCG tablet Take 50 mcg by mouth daily before breakfast.     . metFORMIN (GLUCOPHAGE) 500 MG tablet Take 500 mg tablet in the am with 0.5 tablet in the pm.    . methotrexate (RHEUMATREX) 2.5 MG tablet Take by mouth as directed. Caution:Chemotherapy. Protect from light.    . Omega-3 Fatty Acids (FISH OIL PO) Take by mouth 2 (two) times daily.     [name] omeprazole (PRILOSEC) 20 MG capsule Take by mouth daily.    . pioglitazone (ACTOS) 15 MG tablet Take 15 mg by mouth daily.    . rosuvastatin (CRESTOR) 10 MG tablet Take 10 mg by mouth daily.    Alveda Reasons 20 MG TABS tablet TAKE ONE TABLET EVERY DAY 30 tablet 3   No current facility-administered medications for  this visit.     No Known Allergies  Review of Systems negative except from HPI and PMH  Physical Exam BP 118/72 (BP Location: Left Arm, Patient Position: Sitting, Cuff Size: Normal)   Pulse 74   Ht 5\' 10"  (1.778 m)   Wt 218 lb (98.9 kg)   SpO2 97%   BMI 31.28 kg/m  Well developed and well nourished in no acute distress HENT normal Neck supple with JVP-flat Clear Device pocket well healed; without hematoma or erythema.  There is no tethering  Irregular rate and rhythm, no  gallop  murmur Abd-soft with active BS No Clubbing cyanosis   edema Skin-warm and dry A & Oriented  Grossly normal sensory and motor function  ECG atrial fibrillation at 66 Intervals-/11/48 Occasional PVCs Occasional paced beat   Assessment and  Plan  Congestive heart failure-chronic-systolic  Atrial fibrillation-permanent  Cardiomyopathy ischemic/nonischemic  Orthostatic intolerance-improved  High Risk Medication Surveillance   Ventricular tachycardia -monomorphic-rapid  ICD single chamber-St. Jude The patient's device was interrogated.  The information was reviewed. No changes were made in the programming.     On Anticoagulation;  No bleeding issues  Euvolemic continue current meds  Needs BMET  On epleronone  With orthostasis, will have him take his eplerenone and enalapril at night.  Have also discussed with him the role of thigh sleeves. And the physiology of orthostasis with HTN and DM

## 2019-07-22 NOTE — Patient Instructions (Addendum)
Medication Instructions:  - Your physician has recommended you make the following change in your medication:   1) Take enalapril 2.5 mg- 1 tablet daily at bedtime  2) Take eplerenone 25 mg- 1/2 tablet (12.5 mg) daily at bedtime   If you need a refill on your cardiac medications before your next appointment, please call your pharmacy.   Lab work: - Your physician recommends that you have lab work today: BMP  If you have labs (blood work) drawn today and your tests are completely normal, you will receive your results only by: [name] MyChart Message (if you have MyChart) OR . A paper copy in the mail If you have any lab test that is abnormal or we need to change your treatment, we will call you to review the results.  Testing/Procedures: - none ordered  Follow-Up: At [facility], you and your health needs are our priority.  As part of our continuing mission to provide you with exceptional heart care, we have created designated Provider Care Teams.  These Care Teams include your primary Cardiologist (physician) and Advanced Practice Providers (APPs -  Physician Assistants and Nurse Practitioners) who all work together to provide you with the care you need, when you need it.  You will need a follow up appointment in 1 year (August 2021) with Dr. Caryl Comes.  [name] Please call our office 2 months in advance to schedule this appointment.  (Call in early June 2021 to schedule)    Any Other Special Instructions Will Be Listed Below (If Applicable). - Thigh Sleeves during your waking hours

## 2019-07-23 ENCOUNTER — Telehealth: Payer: Self-pay | Admitting: Internal Medicine

## 2019-07-23 ENCOUNTER — Encounter: Payer: Self-pay | Admitting: Internal Medicine

## 2019-07-23 LAB — BASIC METABOLIC PANEL
BUN/Creatinine Ratio: 16 (ref 10–24)
BUN: 24 mg/dL (ref 8–27)
CO2: 16 mmol/L — ABNORMAL LOW (ref 20–29)
Calcium: 10.4 mg/dL — ABNORMAL HIGH (ref 8.6–10.2)
Chloride: 103 mmol/L (ref 96–106)
Creatinine, Ser: 1.47 mg/dL — ABNORMAL HIGH (ref 0.76–1.27)
GFR calc Af Amer: 52 mL/min/{1.73_m2} — ABNORMAL LOW (ref >59)
GFR calc non Af Amer: 45 mL/min/{1.73_m2} — ABNORMAL LOW (ref >59)
Glucose: 158 mg/dL — ABNORMAL HIGH (ref 65–99)
Potassium: 4.9 mmol/L (ref 3.5–5.2)
Sodium: 139 mmol/L (ref 134–144)

## 2019-07-23 NOTE — Telephone Encounter (Signed)
Notes recorded by Deboraha Sprang, MD on 07/23/2019 at 8:17 AM EDT  Please Inform Patient that Cr is abnormal but is better, Ca level was high but was normal in Feb and His CO2 is low both will requiire a repeat--and eval  Prob best done by his PCP  Can we fax these results to [facility]  Thanks

## 2019-07-23 NOTE — Telephone Encounter (Signed)
Attempted to call the patient. No answer- I left a message to please call back.  

## 2019-07-24 ENCOUNTER — Ambulatory Visit (INDEPENDENT_AMBULATORY_CARE_PROVIDER_SITE_OTHER): Payer: Medicare Other

## 2019-07-24 ENCOUNTER — Other Ambulatory Visit: Payer: Self-pay

## 2019-07-24 DIAGNOSIS — I5022 Chronic systolic (congestive) heart failure: Secondary | ICD-10-CM

## 2019-07-24 MED ORDER — PERFLUTREN LIPID MICROSPHERE
1.0000 mL | INTRAVENOUS | Status: AC | PRN
  Administered 2019-07-24: 2 mL via INTRAVENOUS

## 2019-07-24 NOTE — Telephone Encounter (Signed)
Patient was in clinic today for his echo.  The patient has been notified of the result and verbalized understanding. All questions (if any) were answered.  Alvis Lemmings, RN 07/24/2019 9:38 AM  He will was advised to follow up with Dr. Loney Hering and was agreeable.  Copy of labs forwarded to the patient's PCP.

## 2019-07-25 ENCOUNTER — Telehealth: Payer: Self-pay

## 2019-07-25 NOTE — Progress Notes (Signed)
Remote ICD transmission.   

## 2019-07-25 NOTE — Telephone Encounter (Signed)
-----   Message from [name], MD sent at 07/25/2019 10:17 AM EDT ----- Inform patient that echo showed improvement in ejection fraction to 30 to 35% which is good news.  Continue same medications.

## 2019-07-25 NOTE — Telephone Encounter (Signed)
Called to give the patient echo result. Unable to lmom. Patients phone rings out.

## 2019-08-01 NOTE — Telephone Encounter (Signed)
Patient made aware of echo results and Dr. Arida's recommendation with verbalized understanding. 

## 2019-10-01 ENCOUNTER — Other Ambulatory Visit: Payer: Self-pay | Admitting: Internal Medicine

## 2019-10-17 ENCOUNTER — Encounter: Payer: Medicare Other | Admitting: *Deleted

## 2019-10-21 LAB — CUP PACEART INCLINIC DEVICE CHECK
Battery Remaining Longevity: 90 mo
Brady Statistic RV Percent Paced: 3.7 %
Date Time Interrogation Session: 20200825104700
HighPow Impedance: 84 Ohm
Implantable Lead Implant Date: 20130403
Implantable Lead Location: 753860
Implantable Pulse Generator Implant Date: 20190422
Lead Channel Impedance Value: 412.5 Ohm
Lead Channel Pacing Threshold Amplitude: 1 V
Lead Channel Pacing Threshold Pulse Width: 0.5 ms
Lead Channel Sensing Intrinsic Amplitude: 11.7 mV
Lead Channel Setting Pacing Amplitude: 2.5 V
Lead Channel Setting Pacing Pulse Width: 0.5 ms
Lead Channel Setting Sensing Sensitivity: 0.5 mV
Pulse Gen Serial Number: 9811176

## 2019-11-24 ENCOUNTER — Ambulatory Visit (INDEPENDENT_AMBULATORY_CARE_PROVIDER_SITE_OTHER): Payer: Medicare Other | Admitting: *Deleted

## 2019-11-24 DIAGNOSIS — I4819 Other persistent atrial fibrillation: Secondary | ICD-10-CM

## 2019-11-24 LAB — CUP PACEART REMOTE DEVICE CHECK
Battery Remaining Longevity: 85 mo
Battery Remaining Percentage: 84 %
Battery Voltage: 2.99 V
Brady Statistic RV Percent Paced: 5.6 %
Date Time Interrogation Session: 20201228152632
HighPow Impedance: 83 Ohm
HighPow Impedance: 83 Ohm
Implantable Lead Implant Date: 20130403
Implantable Lead Location: 753860
Implantable Pulse Generator Implant Date: 20190422
Lead Channel Impedance Value: 400 Ohm
Lead Channel Pacing Threshold Amplitude: 1 V
Lead Channel Pacing Threshold Pulse Width: 0.5 ms
Lead Channel Sensing Intrinsic Amplitude: 11.7 mV
Lead Channel Setting Pacing Amplitude: 2.5 V
Lead Channel Setting Pacing Pulse Width: 0.5 ms
Lead Channel Setting Sensing Sensitivity: 0.5 mV
Pulse Gen Serial Number: 9811176

## 2020-01-08 ENCOUNTER — Other Ambulatory Visit: Payer: Self-pay

## 2020-01-08 MED ORDER — FUROSEMIDE 20 MG PO TABS: ORAL_TABLET | ORAL | 0 refills | Status: DC

## 2020-01-20 ENCOUNTER — Other Ambulatory Visit: Payer: Self-pay | Admitting: Cardiovascular Disease

## 2020-02-19 ENCOUNTER — Ambulatory Visit (INDEPENDENT_AMBULATORY_CARE_PROVIDER_SITE_OTHER): Payer: Medicare Other | Admitting: Cardiovascular Disease

## 2020-02-19 ENCOUNTER — Other Ambulatory Visit: Payer: Self-pay

## 2020-02-19 ENCOUNTER — Encounter: Payer: Self-pay | Admitting: Cardiovascular Disease

## 2020-02-19 VITALS — BP 122/70 | HR 80 | Ht 70.0 in | Wt 220.2 lb

## 2020-02-19 DIAGNOSIS — E785 Hyperlipidemia, unspecified: Secondary | ICD-10-CM

## 2020-02-19 DIAGNOSIS — I482 Chronic atrial fibrillation, unspecified: Secondary | ICD-10-CM

## 2020-02-19 DIAGNOSIS — I5022 Chronic systolic (congestive) heart failure: Secondary | ICD-10-CM

## 2020-02-19 DIAGNOSIS — I4819 Other persistent atrial fibrillation: Secondary | ICD-10-CM | POA: Diagnosis not present

## 2020-02-19 DIAGNOSIS — I25810 Atherosclerosis of coronary artery bypass graft(s) without angina pectoris: Secondary | ICD-10-CM | POA: Diagnosis not present

## 2020-02-19 MED ORDER — ROSUVASTATIN CALCIUM 5 MG PO TABS: ORAL_TABLET | ORAL | 11 refills | Status: DC

## 2020-02-19 NOTE — Progress Notes (Signed)
Cardiology Office Note   Date:  02/19/2020   ID:  Andres Gandy Sr., DOB 1941/09/05, MRN [id number]  PCP:  [name], MD  Cardiologist:   Kathlyn Sacramento, MD   Chief Complaint  Patient presents with  . other    6 month follow up. Meds reviewed by the pt. verbally. Pt. c/o dizziness for several months.       History of Present Illness: Andres HILAIRE Sr. is a 79 y.o. male who presents for a follow up regarding chronic systolic heart failure, chronic atrial fibrillation and coronary artery disease.   He had previous one-vessel CABG in 1994 after failed angioplasty on the left circumflex complicated by dissection.   Cardiac catheterization in December 2016 showed occluded SVG to OM 3. The mid left circumflex had diffuse 90% disease into small OM3 which was also diffusely diseased and relatively small in size. EF was 15% with mildly elevated LVEDP. I recommended medical therapy. He had previous ventricular tachycardia and syncope that was successfully treated with ICD shock with subsequent adjustment of his device to deliver ATP.   He had ICD generator replacement in April 2019. Most recent echocardiogram in August 2020 showed an EF of 30 to 35% with mild pulmonary hypertension.  He has been doing reasonably well with no recent chest pain or worsening dyspnea.  No palpitations.  His biggest issue seems to be dizziness.  This happens mostly when he changes in position.  It Can happen when he is in bed after he opens his eyes.  From his description, it sounds like vertigo.  Past Medical History:  Diagnosis Date  . Arthritis   . Atrial fibrillation, persistent-long-term   . Chronic systolic congestive heart failure ([place])    a. 03/2015 Echo: EF 30-35%, diff HK, mild MR, mod dil LA, mildly dil RA, mild TR.  [name] Coronary artery disease    a. 1994 s/p CABG x 1 (VG->OM3);  b. 10/2015 Cath: LM nl, LAD min irregs, D1 nl, D2 min irregs, LCX 63m, OM2 nl, OM3 90 (small territory),  VG->OM3 100, RCA min irregs, RPDA/RPL1/RPL2/RPL3 nl, EF 25%-->Med Rx.  [name] Hx of squamous cell carcinoma of skin 06/04/2013   L upper arm  . Hyperlipidemia   . ICD (implantable cardiac defibrillator) in place    a. 02/2012 s/p SJM [id number] Fortify Assurance single lead AICD.  [name] Mixed Ischemic and Non-ischemic Cardiomyopathy    a. 07/2012 s/p SJM AICD (RV lead only);  b. 03/2015 Echo: EF 30-35%, diff HK;  c. EF 25% by LV gram.  . Nephrolithiasis 1970's  . Polymyalgia rheumatica (HCC)    On steroids  . Sleep apnea    On CPAP  . Type II diabetes mellitus ([place])   . Ventricular tachycardia -inducible at EP testing    a. 07/2012 s/ SJM AICD.    Past Surgical History:  Procedure Laterality Date  . basel cell removal     left arm  . CARDIAC CATHETERIZATION  02/28/2012   Minor disease in LAD and RCA, LCX: 70% mid and occluded distally. Patent SVG to OM3  . CARDIAC CATHETERIZATION N/A 11/08/2015   Procedure: Left Heart Cath and Cors/Grafts Angiography;  Surgeon: [name], MD;  Location: [place] [facility];  Service: Cardiovascular;  Laterality: N/A;  . CARDIOVERSION  03/01/2012   Procedure: CARDIOVERSION;  Surgeon: Deboraha Sprang, MD;  Location: [place];  Service: Cardiovascular;  Laterality: N/A;  . CATARACT EXTRACTION W/ INTRAOCULAR LENS  IMPLANT, BILATERAL  2011  . CORONARY ARTERY BYPASS GRAFT  1994   [place] ; CABG X1  . ELECTROPHYSIOLOGY STUDY N/A 02/28/2012   Procedure: ELECTROPHYSIOLOGY STUDY;  Surgeon: Deboraha Sprang, MD;  Location[facility];  Service: Cardiovascular;  Laterality: N/A;  . ICD GENERATOR CHANGEOUT N/A 03/18/2018   Procedure: [place];  Surgeon: Evans Lance, MD;  Location: [place] [facility] LAB;  Service: Cardiovascular;  Laterality: N/A;  . INSERT / REPLACE / REMOVE PACEMAKER  02/28/12   "w/defibrillator"  . JOINT REPLACEMENT    . KNEE ARTHROSCOPY  2012   right  . LEFT HEART CATHETERIZATION WITH CORONARY/GRAFT ANGIOGRAM N/A 02/28/2012   Procedure: LEFT  HEART CATHETERIZATION WITH [name];  Surgeon: [name], MD;  Location: [place] CATH LAB;  Service: Cardiovascular;  Laterality: N/A;  . TOTAL KNEE ARTHROPLASTY  2011   left     Current Outpatient Medications  Medication Sig Dispense Refill  . carvedilol (COREG) 6.25 MG tablet TAKE ONE TABLET TWICE DAILY 60 tablet 6  . Cinnamon 500 MG TABS Take 1,000 mg by mouth 2 (two) times daily.     . Coenzyme Q10 (CO Q 10 PO) Take 300 mg by mouth daily.    . cyclobenzaprine (FLEXERIL) 5 MG tablet Take 5 mg by mouth 3 (three) times daily as needed for muscle spasms.    . diphenhydramine-acetaminophen (TYLENOL PM) 25-500 MG TABS tablet Take 2 tablets by mouth at bedtime as needed.    . enalapril (VASOTEC) 2.5 MG tablet Take 2.5 mg by mouth daily.    [name] eplerenone (INSPRA) 25 MG tablet TAKE ONE-HALF TABLET BY MOUTH EVERY DAY 45 tablet 2  . fluticasone (FLONASE) 50 MCG/ACT nasal spray Place into the nose daily.    . folic acid (FOLVITE) Q000111Q MCG tablet Take 400 mcg by mouth daily.    . furosemide (LASIX) 20 MG tablet TAKE ONE TABLET EVERY DAY 30 tablet 0  . GARLIC PO Take 1 capsule by mouth daily.     . Glucosamine-Chondroit-Vit C-Mn (GLUCOSAMINE CHONDR 1500 COMPLX PO) Take 1,500 mg by mouth 2 (two) times daily.     . insulin NPH Human (HUMULIN N,NOVOLIN N) 100 UNIT/ML injection Inject 95 Units into the skin at bedtime.     . insulin regular (NOVOLIN R,HUMULIN R) 100 units/mL injection Inject 30 Units into the skin 3 (three) times daily before meals.     [name] levothyroxine (SYNTHROID, LEVOTHROID) 50 MCG tablet Take 50 mcg by mouth daily before breakfast.     . metFORMIN (GLUCOPHAGE) 500 MG tablet Take 500 mg tablet in the am with 0.5 tablet in the pm.    . methotrexate (RHEUMATREX) 2.5 MG tablet Take by mouth as directed. Caution:Chemotherapy. Protect from light.    . Omega-3 Fatty Acids (FISH OIL PO) Take by mouth 2 (two) times daily.     [name] omeprazole (PRILOSEC) 20 MG capsule Take by mouth  daily.    . pioglitazone (ACTOS) 15 MG tablet Take 15 mg by mouth daily.    . rosuvastatin (CRESTOR) 5 MG tablet Take 2 tablets (10mg ) daily alternating 1 tablet (5 mg) daily 45 tablet 11  . XARELTO 20 MG TABS tablet TAKE ONE TABLET EVERY DAY 30 tablet 3   No current facility-administered medications for this visit.    Allergies:   Patient has no known allergies.    Social History:  The patient  reports that he quit smoking about 32 years ago. His smoking use included cigarettes. He has a  25.00 pack-year smoking history. He has never used smokeless tobacco. He reports that he does not drink alcohol or use drugs.   Family History:  The patient's family history includes Heart attack in his brother and father.    ROS:  Please see the history of present illness.   Otherwise, review of systems are positive for none.   All other systems are reviewed and negative.    PHYSICAL EXAM: VS:  BP 122/70 (BP Location: Left Arm, Patient Position: Sitting, Cuff Size: Normal)   Pulse 80   Ht 5\' 10"  (1.778 m)   Wt 220 lb 4 oz (99.9 kg)   SpO2 98%   BMI 31.60 kg/m  , BMI Body mass index is 31.6 kg/m. GEN: Well nourished, well developed, in no acute distress  HEENT: normal  Neck: no JVD, carotid bruits, or masses Cardiac: Irregularly irregular; no murmurs, rubs, or gallops,no edema  Respiratory:  clear to auscultation bilaterally, normal work of breathing GI: soft, nontender, nondistended, + BS MS: no deformity or atrophy  Skin: warm and dry, no rash Neuro:  Strength and sensation are intact Psych: euthymic mood, full affect   EKG:  EKG is ordered today. The ekg ordered today demonstrates atrial fibrillation with a PVC, left anterior fascicular block.  Recent Labs: 07/22/2019: BUN 24; Creatinine, Ser 1.47; Potassium 4.9; Sodium 139    Lipid Panel No results found for: CHOL, TRIG, HDL, CHOLHDL, VLDL, LDLCALC, LDLDIRECT    Wt Readings from Last 3 Encounters:  02/19/20 220 lb 4 oz (99.9  kg)  07/22/19 218 lb (98.9 kg)  07/03/19 220 lb (99.8 kg)      ASSESSMENT AND PLAN:  1.  Chronic systolic heart failure: Most recent ejection fraction was 30 to 35%..  He appears to be euvolemic.  Continue treatment with carvedilol, enalapril and eplerenone.  He has known history of intermittent hypotension and thus I have not attempted to switch him from enalapril to [facility].  2. Coronary artery disease: Mainly affecting the left circumflex supplying a small territory.  Continue medical therapy.  3. Chronic atrial fibrillation: Ventricular rate is well controlled. Continue anticoagulation with Xarelto.  I reviewed his recent labs which showed stable renal function and CBC.    4. Diabetes mellitus:  Managed by primary care physician  5. Hyperlipidemia: He is not able to tolerate higher doses of statins but is able to take small dose rosuvastatin.  He takes 10 mg alternating with 5 mg.  6.  Dizziness: I do not think this is cardiac.  He is not orthostatic today and in spite of that he felt dizzy and lightheaded with spinning sensation.  I suspect that his symptoms are due to vertigo and could be due to benign positional vertigo.  He has an appointment scheduled with ENT and I advised him to address that with them.   Disposition:   FU with me in 6 months  Signed,  Kathlyn Sacramento, MD  02/19/2020 3:45 PM    [place]

## 2020-02-19 NOTE — Patient Instructions (Signed)
Medication Instructions:  Your physician recommends that you continue on your current medications as directed. Please refer to the Current Medication list given to you today.  Crestor (rosuvastatin) has been refilled today.  *If you need a refill on your cardiac medications before your next appointment, please call your pharmacy*   Lab Work: None ordered If you have labs (blood work) drawn today and your tests are completely normal, you will receive your results only by: [name] MyChart Message (if you have MyChart) OR . A paper copy in the mail If you have any lab test that is abnormal or we need to change your treatment, we will call you to review the results.   Testing/Procedures: None ordered   Follow-Up: At [facility], you and your health needs are our priority.  As part of our continuing mission to provide you with exceptional heart care, we have created designated Provider Care Teams.  These Care Teams include your primary Cardiologist (physician) and Advanced Practice Providers (APPs -  Physician Assistants and Nurse Practitioners) who all work together to provide you with the care you need, when you need it.  We recommend signing up for the patient portal called "MyChart".  Sign up information is provided on this After Visit Summary.  MyChart is used to connect with patients for Virtual Visits (Telemedicine).  Patients are able to view lab/test results, encounter notes, upcoming appointments, etc.  Non-urgent messages can be sent to your provider as well.   To learn more about what you can do with MyChart, go to [email or web address].    Your next appointment:   6 month(s)  The format for your next appointment:   In Person  Provider:    You may see [name] or one of the following Advanced Practice Providers on your designated Care Team:    Murray Hodgkins, NP  Christell Faith, PA-C  Marrianne Mood, PA-C    Other Instructions N/A

## 2020-02-23 ENCOUNTER — Ambulatory Visit (INDEPENDENT_AMBULATORY_CARE_PROVIDER_SITE_OTHER): Payer: Medicare Other | Admitting: *Deleted

## 2020-02-23 DIAGNOSIS — I4819 Other persistent atrial fibrillation: Secondary | ICD-10-CM | POA: Diagnosis not present

## 2020-02-23 LAB — CUP PACEART REMOTE DEVICE CHECK
Battery Remaining Longevity: 83 mo
Battery Remaining Percentage: 82 %
Battery Voltage: 2.99 V
Brady Statistic RV Percent Paced: 4.6 %
Date Time Interrogation Session: 20210329020017
HighPow Impedance: 83 Ohm
HighPow Impedance: 83 Ohm
Implantable Lead Implant Date: 20130403
Implantable Lead Location: 753860
Implantable Pulse Generator Implant Date: 20190422
Lead Channel Impedance Value: 380 Ohm
Lead Channel Pacing Threshold Amplitude: 1 V
Lead Channel Pacing Threshold Pulse Width: 0.5 ms
Lead Channel Sensing Intrinsic Amplitude: 11.7 mV
Lead Channel Setting Pacing Amplitude: 2.5 V
Lead Channel Setting Pacing Pulse Width: 0.5 ms
Lead Channel Setting Sensing Sensitivity: 0.5 mV
Pulse Gen Serial Number: 9811176

## 2020-02-23 NOTE — Progress Notes (Signed)
ICD Remote  

## 2020-03-17 ENCOUNTER — Other Ambulatory Visit: Payer: Self-pay | Admitting: Cardiovascular Disease

## 2020-05-24 ENCOUNTER — Ambulatory Visit (INDEPENDENT_AMBULATORY_CARE_PROVIDER_SITE_OTHER): Payer: Medicare Other | Admitting: *Deleted

## 2020-05-24 DIAGNOSIS — I255 Ischemic cardiomyopathy: Secondary | ICD-10-CM | POA: Diagnosis not present

## 2020-05-24 DIAGNOSIS — I5022 Chronic systolic (congestive) heart failure: Secondary | ICD-10-CM

## 2020-05-24 LAB — CUP PACEART REMOTE DEVICE CHECK
Battery Remaining Longevity: 80 mo
Battery Remaining Percentage: 80 %
Battery Voltage: 2.98 V
Brady Statistic RV Percent Paced: 4.7 %
Date Time Interrogation Session: 20210628020014
HighPow Impedance: 86 Ohm
HighPow Impedance: 86 Ohm
Implantable Lead Implant Date: 20130403
Implantable Lead Location: 753860
Implantable Pulse Generator Implant Date: 20190422
Lead Channel Impedance Value: 380 Ohm
Lead Channel Pacing Threshold Amplitude: 1 V
Lead Channel Pacing Threshold Pulse Width: 0.5 ms
Lead Channel Sensing Intrinsic Amplitude: 11.7 mV
Lead Channel Setting Pacing Amplitude: 2.5 V
Lead Channel Setting Pacing Pulse Width: 0.5 ms
Lead Channel Setting Sensing Sensitivity: 0.5 mV
Pulse Gen Serial Number: 9811176

## 2020-05-25 NOTE — Progress Notes (Signed)
Remote ICD transmission.   

## 2020-05-26 ENCOUNTER — Emergency Department: Payer: Medicare Other

## 2020-05-26 ENCOUNTER — Other Ambulatory Visit: Payer: Self-pay

## 2020-05-26 ENCOUNTER — Inpatient Hospital Stay
Admission: EM | Admit: 2020-05-26 | Discharge: 2020-05-29 | DRG: 083 | Disposition: A | Discharge status: Home / Self Care | Payer: Medicare Other | Attending: Internal Medicine | Admitting: Internal Medicine

## 2020-05-26 ENCOUNTER — Encounter: Payer: Self-pay | Admitting: Emergency Medicine

## 2020-05-26 DIAGNOSIS — R55 Syncope and collapse: Secondary | ICD-10-CM

## 2020-05-26 DIAGNOSIS — I5022 Chronic systolic (congestive) heart failure: Secondary | ICD-10-CM | POA: Diagnosis present

## 2020-05-26 DIAGNOSIS — I4821 Permanent atrial fibrillation: Secondary | ICD-10-CM

## 2020-05-26 DIAGNOSIS — E785 Hyperlipidemia, unspecified: Secondary | ICD-10-CM | POA: Diagnosis present

## 2020-05-26 DIAGNOSIS — M353 Polymyalgia rheumatica: Secondary | ICD-10-CM | POA: Diagnosis present

## 2020-05-26 DIAGNOSIS — Z95810 Presence of automatic (implantable) cardiac defibrillator: Secondary | ICD-10-CM | POA: Diagnosis not present

## 2020-05-26 DIAGNOSIS — E1122 Type 2 diabetes mellitus with diabetic chronic kidney disease: Secondary | ICD-10-CM | POA: Diagnosis present

## 2020-05-26 DIAGNOSIS — N1831 Chronic kidney disease, stage 3a: Secondary | ICD-10-CM | POA: Diagnosis present

## 2020-05-26 DIAGNOSIS — Z87891 Personal history of nicotine dependence: Secondary | ICD-10-CM

## 2020-05-26 DIAGNOSIS — Z7901 Long term (current) use of anticoagulants: Secondary | ICD-10-CM

## 2020-05-26 DIAGNOSIS — I472 Ventricular tachycardia, unspecified: Secondary | ICD-10-CM

## 2020-05-26 DIAGNOSIS — Z20822 Contact with and (suspected) exposure to covid-19: Secondary | ICD-10-CM | POA: Diagnosis present

## 2020-05-26 DIAGNOSIS — Z87442 Personal history of urinary calculi: Secondary | ICD-10-CM

## 2020-05-26 DIAGNOSIS — E1165 Type 2 diabetes mellitus with hyperglycemia: Secondary | ICD-10-CM | POA: Diagnosis present

## 2020-05-26 DIAGNOSIS — I420 Dilated cardiomyopathy: Secondary | ICD-10-CM | POA: Diagnosis present

## 2020-05-26 DIAGNOSIS — Z79899 Other long term (current) drug therapy: Secondary | ICD-10-CM

## 2020-05-26 DIAGNOSIS — I4819 Other persistent atrial fibrillation: Secondary | ICD-10-CM | POA: Diagnosis not present

## 2020-05-26 DIAGNOSIS — I493 Ventricular premature depolarization: Secondary | ICD-10-CM | POA: Diagnosis present

## 2020-05-26 DIAGNOSIS — Z96652 Presence of left artificial knee joint: Secondary | ICD-10-CM | POA: Diagnosis present

## 2020-05-26 DIAGNOSIS — Z9841 Cataract extraction status, right eye: Secondary | ICD-10-CM

## 2020-05-26 DIAGNOSIS — Z961 Presence of intraocular lens: Secondary | ICD-10-CM | POA: Diagnosis present

## 2020-05-26 DIAGNOSIS — Z85828 Personal history of other malignant neoplasm of skin: Secondary | ICD-10-CM

## 2020-05-26 DIAGNOSIS — N179 Acute kidney failure, unspecified: Secondary | ICD-10-CM | POA: Diagnosis not present

## 2020-05-26 DIAGNOSIS — Z9842 Cataract extraction status, left eye: Secondary | ICD-10-CM

## 2020-05-26 DIAGNOSIS — S065X9A Traumatic subdural hemorrhage with loss of consciousness of unspecified duration, initial encounter: Principal | ICD-10-CM | POA: Diagnosis present

## 2020-05-26 DIAGNOSIS — Z8249 Family history of ischemic heart disease and other diseases of the circulatory system: Secondary | ICD-10-CM

## 2020-05-26 DIAGNOSIS — I2510 Atherosclerotic heart disease of native coronary artery without angina pectoris: Secondary | ICD-10-CM | POA: Diagnosis present

## 2020-05-26 DIAGNOSIS — Z7902 Long term (current) use of antithrombotics/antiplatelets: Secondary | ICD-10-CM

## 2020-05-26 DIAGNOSIS — L259 Unspecified contact dermatitis, unspecified cause: Secondary | ICD-10-CM

## 2020-05-26 DIAGNOSIS — S0101XA Laceration without foreign body of scalp, initial encounter: Secondary | ICD-10-CM | POA: Diagnosis present

## 2020-05-26 DIAGNOSIS — Z794 Long term (current) use of insulin: Secondary | ICD-10-CM

## 2020-05-26 DIAGNOSIS — G4733 Obstructive sleep apnea (adult) (pediatric): Secondary | ICD-10-CM | POA: Diagnosis present

## 2020-05-26 DIAGNOSIS — S065XAA Traumatic subdural hemorrhage with loss of consciousness status unknown, initial encounter: Secondary | ICD-10-CM

## 2020-05-26 DIAGNOSIS — E039 Hypothyroidism, unspecified: Secondary | ICD-10-CM

## 2020-05-26 DIAGNOSIS — I130 Hypertensive heart and chronic kidney disease with heart failure and stage 1 through stage 4 chronic kidney disease, or unspecified chronic kidney disease: Secondary | ICD-10-CM | POA: Diagnosis present

## 2020-05-26 DIAGNOSIS — I25709 Atherosclerosis of coronary artery bypass graft(s), unspecified, with unspecified angina pectoris: Secondary | ICD-10-CM

## 2020-05-26 DIAGNOSIS — E119 Type 2 diabetes mellitus without complications: Secondary | ICD-10-CM

## 2020-05-26 LAB — CBC WITH DIFFERENTIAL/PLATELET
Abs Immature Granulocytes: 0.03 10*3/uL (ref 0.00–0.07)
Basophils Absolute: 0.1 10*3/uL (ref 0.0–0.1)
Basophils Relative: 1 %
Eosinophils Absolute: 0.2 10*3/uL (ref 0.0–0.5)
Eosinophils Relative: 3 %
HCT: 40.2 % (ref 39.0–52.0)
Hemoglobin: 14.2 g/dL (ref 13.0–17.0)
Immature Granulocytes: 0 %
Lymphocytes Relative: 23 %
Lymphs Abs: 1.8 10*3/uL (ref 0.7–4.0)
MCH: 33.8 pg (ref 26.0–34.0)
MCHC: 35.3 g/dL (ref 30.0–36.0)
MCV: 95.7 fL (ref 80.0–100.0)
Monocytes Absolute: 1 10*3/uL (ref 0.1–1.0)
Monocytes Relative: 12 %
Neutro Abs: 5 10*3/uL (ref 1.7–7.7)
Neutrophils Relative %: 61 %
Platelets: 206 10*3/uL (ref 150–400)
RBC: 4.2 MIL/uL — ABNORMAL LOW (ref 4.22–5.81)
RDW: 16.2 % — ABNORMAL HIGH (ref 11.5–15.5)
WBC: 8.2 10*3/uL (ref 4.0–10.5)
nRBC: 0 % (ref 0.0–0.2)

## 2020-05-26 LAB — BASIC METABOLIC PANEL
Anion gap: 10 (ref 5–15)
BUN: 29 mg/dL — ABNORMAL HIGH (ref 8–23)
CO2: 22 mmol/L (ref 22–32)
Calcium: 9 mg/dL (ref 8.9–10.3)
Chloride: 105 mmol/L (ref 98–111)
Creatinine, Ser: 1.53 mg/dL — ABNORMAL HIGH (ref 0.61–1.24)
GFR calc Af Amer: 49 mL/min — ABNORMAL LOW (ref >60)
GFR calc non Af Amer: 43 mL/min — ABNORMAL LOW (ref >60)
Glucose, Bld: 179 mg/dL — ABNORMAL HIGH (ref 70–99)
Potassium: 4.3 mmol/L (ref 3.5–5.1)
Sodium: 137 mmol/L (ref 135–145)

## 2020-05-26 LAB — SARS CORONAVIRUS 2 BY RT PCR (HOSPITAL ORDER, PERFORMED IN ~~LOC~~ HOSPITAL LAB): SARS Coronavirus 2: NEGATIVE

## 2020-05-26 LAB — TROPONIN I (HIGH SENSITIVITY): Troponin I (High Sensitivity): 13 ng/L (ref <18)

## 2020-05-26 LAB — MAGNESIUM: Magnesium: 1.7 mg/dL (ref 1.7–2.4)

## 2020-05-26 MED ORDER — TRIAMCINOLONE ACETONIDE 0.5 % EX CREA
TOPICAL_CREAM | Freq: Two times a day (BID) | CUTANEOUS | Status: DC
  Filled 2020-05-26: qty 15

## 2020-05-26 MED ORDER — FUROSEMIDE 20 MG PO TABS
20.0000 mg | ORAL_TABLET | Freq: Every day | ORAL | Status: DC
  Administered 2020-05-27 – 2020-05-29 (×3): 20 mg via ORAL
  Filled 2020-05-26 (×3): qty 1

## 2020-05-26 MED ORDER — FOLIC ACID 1 MG PO TABS
1.0000 mg | ORAL_TABLET | Freq: Every day | ORAL | Status: DC
  Administered 2020-05-27 – 2020-05-29 (×3): 1 mg via ORAL
  Filled 2020-05-26 (×3): qty 1

## 2020-05-26 MED ORDER — INSULIN ASPART 100 UNIT/ML ~~LOC~~ SOLN
15.0000 [IU] | Freq: Three times a day (TID) | SUBCUTANEOUS | Status: DC
  Administered 2020-05-27 – 2020-05-29 (×8): 15 [IU] via SUBCUTANEOUS
  Filled 2020-05-26 (×8): qty 1

## 2020-05-26 MED ORDER — LIDOCAINE-EPINEPHRINE 2 %-1:100000 IJ SOLN
20.0000 mL | Freq: Once | INTRAMUSCULAR | Status: AC
  Administered 2020-05-26: 20 mL
  Filled 2020-05-26: qty 1

## 2020-05-26 MED ORDER — SPIRONOLACTONE 25 MG PO TABS
25.0000 mg | ORAL_TABLET | Freq: Every day | ORAL | Status: DC
  Administered 2020-05-27 – 2020-05-28 (×2): 25 mg via ORAL
  Filled 2020-05-26 (×2): qty 1

## 2020-05-26 MED ORDER — LACTATED RINGERS IV BOLUS
1000.0000 mL | Freq: Once | INTRAVENOUS | Status: AC
  Administered 2020-05-26: 1000 mL via INTRAVENOUS

## 2020-05-26 MED ORDER — INSULIN ASPART 100 UNIT/ML ~~LOC~~ SOLN
0.0000 [IU] | Freq: Every day | SUBCUTANEOUS | Status: DC
  Administered 2020-05-28: 2 [IU] via SUBCUTANEOUS
  Filled 2020-05-26: qty 1

## 2020-05-26 MED ORDER — LEVOTHYROXINE SODIUM 50 MCG PO TABS
50.0000 ug | ORAL_TABLET | Freq: Every day | ORAL | Status: DC
  Administered 2020-05-27 – 2020-05-29 (×3): 50 ug via ORAL
  Filled 2020-05-26 (×3): qty 1

## 2020-05-26 MED ORDER — INSULIN ASPART 100 UNIT/ML ~~LOC~~ SOLN
0.0000 [IU] | Freq: Three times a day (TID) | SUBCUTANEOUS | Status: DC
  Administered 2020-05-27: 4 [IU] via SUBCUTANEOUS
  Administered 2020-05-27: 11 [IU] via SUBCUTANEOUS
  Administered 2020-05-27: 4 [IU] via SUBCUTANEOUS
  Administered 2020-05-28 (×2): 7 [IU] via SUBCUTANEOUS
  Administered 2020-05-28 – 2020-05-29 (×2): 11 [IU] via SUBCUTANEOUS
  Administered 2020-05-29: 3 [IU] via SUBCUTANEOUS
  Filled 2020-05-26 (×8): qty 1

## 2020-05-26 MED ORDER — CARVEDILOL 6.25 MG PO TABS
6.2500 mg | ORAL_TABLET | Freq: Two times a day (BID) | ORAL | Status: DC
  Administered 2020-05-27 – 2020-05-28 (×3): 6.25 mg via ORAL
  Filled 2020-05-26 (×3): qty 1

## 2020-05-26 MED ORDER — INSULIN GLARGINE 100 UNIT/ML ~~LOC~~ SOLN
60.0000 [IU] | Freq: Every day | SUBCUTANEOUS | Status: DC
  Administered 2020-05-27 – 2020-05-28 (×2): 60 [IU] via SUBCUTANEOUS
  Filled 2020-05-26 (×4): qty 0.6

## 2020-05-26 MED ORDER — SODIUM CHLORIDE 0.9% FLUSH
3.0000 mL | Freq: Two times a day (BID) | INTRAVENOUS | Status: DC
  Administered 2020-05-27 – 2020-05-29 (×5): 3 mL via INTRAVENOUS

## 2020-05-26 MED ORDER — ROSUVASTATIN CALCIUM 5 MG PO TABS
5.0000 mg | ORAL_TABLET | Freq: Every day | ORAL | Status: DC
  Administered 2020-05-27 – 2020-05-28 (×2): 5 mg via ORAL
  Filled 2020-05-26 (×3): qty 1

## 2020-05-26 MED ORDER — HYDROCORTISONE 1 % EX CREA
TOPICAL_CREAM | Freq: Two times a day (BID) | CUTANEOUS | Status: DC
  Filled 2020-05-26: qty 28

## 2020-05-26 NOTE — ED Notes (Signed)
Resumed care from dee rn.  md stapling head lac.  Pt alert  Speech clear.  Family with pt. afib on monitor.  Skin warm and dry   Iv in place.

## 2020-05-26 NOTE — ED Triage Notes (Signed)
Arrives via EMS.  Patient had a syncopal episode in the parking lot.  Per report, it appeared that patient hit head on curb.  Laceration to back of head, bleeding controlled at this time.  Patient arrives AAOx3.  Skin warm and dry. MAE equally and strong.

## 2020-05-26 NOTE — H&P (Signed)
History and Physical    Andres [name] KZS:[id number] DOB: [birth year] DOA: 05/26/2020  PCP: [name], MD  Patient coming from: Parking lot via EMS  I have personally briefly reviewed patient's old medical records in [place]  Chief Complaint: Syncope  HPI: Andres GRUENHAGEN Sr. is a 79 y.o. 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  Patient was coming out of a home-improvement store and about to get into his car and then remembers waking up with EMS.  Prior to that he states that he was lifting about 10 bags of heavy concrete in the store.  He denies any dizziness or lightheadedness.  No chest pain palpitations or shortness of breath.  States he was otherwise in his normal state of health. He had an episode like this about 2 to 3 years ago after mowing lawns in a hot day.  At that time he was told that his defibrillator had gone off.  In the ED, he was afebrile, normotensive on room air. CBC shows no leukocytosis or anemia. Potassium normal at 4.3.  Glucose of 179.  Creatinine of 1.53 Troponin of 13.  CT head showed trace amount of parafalcine subdural hematoma and left posteriosuperior scalp soft tissue injury without fracture.   Head CT was repeated after 6 hours per neurosurgery recommendation and showed stable subdural hematoma.  Unfortunately, defibrillator interrogation could not be completed since machine was unable to transmit through network.  Will need to reattempt in the morning once again get a local representative.  Review of Systems:  Constitutional: No Weight Change, No Fever ENT/Mouth: No sore throat, No Rhinorrhea Eyes: No Eye Pain, No Vision Changes Cardiovascular: No Chest Pain, no SOB, No Edema, No Palpitations Respiratory: No Cough, No Sputum Gastrointestinal: No Nausea, No Vomiting, No Diarrhea, No Constipation, No  Pain Genitourinary: no Urinary Incontinence Musculoskeletal: No Arthralgias, No Myalgias Skin: No Skin Lesions, No Pruritus, Neuro: no Weakness, No Numbness,  +Loss of Consciousness, + Syncope Psych: No Anxiety/Panic, No Depression, no decrease appetite Heme/Lymph: No Bruising, No Bleeding  Past Medical History:  Diagnosis Date  . Arthritis   . Atrial fibrillation, persistent-long-term   . Chronic systolic congestive heart failure ([place])    a. 03/2015 Echo: EF 30-35%, diff HK, mild MR, mod dil LA, mildly dil RA, mild TR.  [name] Coronary artery disease    a. 1994 s/p CABG x 1 (VG->OM3);  b. 10/2015 Cath: LM nl, LAD min irregs, D1 nl, D2 min irregs, LCX 88m, OM2 nl, OM3 90 (small territory), VG->OM3 100, RCA min irregs, RPDA/RPL1/RPL2/RPL3 nl, EF 25%-->Med Rx.  [name] Hx of squamous cell carcinoma of skin 06/04/2013   L upper arm  . Hyperlipidemia   . ICD (implantable cardiac defibrillator) in place    a. 02/2012 s/p SJM [id number] Fortify Assurance single lead AICD.  [name] Mixed Ischemic and Non-ischemic Cardiomyopathy    a. 07/2012 s/p SJM AICD (RV lead only);  b. 03/2015 Echo: EF 30-35%, diff HK;  c. EF 25% by LV gram.  . Nephrolithiasis 1970's  . Polymyalgia rheumatica (HCC)    On steroids  . Sleep apnea    On CPAP  . Type II diabetes mellitus ([place])   . Ventricular tachycardia -inducible at EP testing    a. 07/2012 s/ SJM AICD.    Past Surgical History:  Procedure Laterality Date  . basel cell removal  left arm  . CARDIAC CATHETERIZATION  02/28/2012   Minor disease in LAD and RCA, LCX: 70% mid and occluded distally. Patent SVG to OM3  . CARDIAC CATHETERIZATION N/A 11/08/2015   Procedure: Left Heart Cath and Cors/Grafts Angiography;  Surgeon: [name], MD;  Location: [facility];  Service: Cardiovascular;  Laterality: N/A;  . CARDIOVERSION  03/01/2012   Procedure: CARDIOVERSION;  Surgeon: Deboraha Sprang, MD;  Location: [facility];  Service: Cardiovascular;  Laterality: N/A;  .  CATARACT EXTRACTION W/ INTRAOCULAR LENS  IMPLANT, BILATERAL  2011  . CORONARY ARTERY BYPASS GRAFT  1994   [place] ; CABG X1  . ELECTROPHYSIOLOGY STUDY N/A 02/28/2012   Procedure: ELECTROPHYSIOLOGY STUDY;  Surgeon: Deboraha Sprang, MD;  Location: [facility];  Service: Cardiovascular;  Laterality: N/A;  . ICD GENERATOR CHANGEOUT N/A 03/18/2018   Procedure: [place];  Surgeon: Evans Lance, MD;  Location: [facility];  Service: Cardiovascular;  Laterality: N/A;  . INSERT / REPLACE / REMOVE PACEMAKER  02/28/12   "w/defibrillator"  . JOINT REPLACEMENT    . KNEE ARTHROSCOPY  2012   right  . LEFT HEART CATHETERIZATION WITH CORONARY/GRAFT ANGIOGRAM N/A 02/28/2012   Procedure: LEFT HEART CATHETERIZATION WITH [name];  Surgeon: [name], MD;  Location: [facility];  Service: Cardiovascular;  Laterality: N/A;  . TOTAL KNEE ARTHROPLASTY  2011   left     reports that he quit smoking about 32 years ago. His smoking use included cigarettes. He has a 25.00 pack-year smoking history. He has never used smokeless tobacco. He reports that he does not drink alcohol and does not use drugs.  No Known Allergies  Family History  Problem Relation Age of Onset  . Heart attack Father   . Heart attack Brother      Prior to Admission medications   Medication Sig Start Date End Date Taking? Authorizing Provider  carvedilol (COREG) 6.25 MG tablet TAKE ONE TABLET TWICE DAILY 11/29/17  Yes Arida, Mertie Clause, MD  Cinnamon 500 MG TABS Take 1,000 mg by mouth 2 (two) times daily.    Yes [provider]  Coenzyme Q10 (CO Q 10 PO) Take 300 mg by mouth daily.   Yes [provider]  diphenhydramine-acetaminophen (TYLENOL PM) 25-500 MG TABS tablet Take 2 tablets by mouth at bedtime as needed.   Yes [provider]  enalapril (VASOTEC) 2.5 MG tablet Take 2.5 mg by mouth daily. 03/07/16  Yes [provider]  eplerenone (INSPRA) 25 MG tablet TAKE  ONE-HALF TABLET BY MOUTH EVERY DAY 10/01/19  Yes Deboraha Sprang, MD  fluticasone [facility]) 50 MCG/ACT nasal spray Place into the nose daily. 06/06/18  Yes [provider]  folic acid (FOLVITE) 1 MG tablet Take 1 mg by mouth daily.    Yes [provider]  furosemide (LASIX) 20 MG tablet TAKE ONE TABLET EVERY DAY 03/17/20  Yes [name], MD  Garlic 1191 MG CAPS Take 1 capsule by mouth daily.    Yes [provider]  Glucosamine-Chondroit-Vit C-Mn (GLUCOSAMINE CHONDR 1500 COMPLX PO) Take 1,500 mg by mouth 2 (two) times daily.    Yes [provider]  insulin NPH Human (HUMULIN N,NOVOLIN N) 100 UNIT/ML injection Inject 95 Units into the skin at bedtime.    Yes [provider]  insulin regular (NOVOLIN R,HUMULIN R) 100 units/mL injection Inject 30 Units into the skin 3 (three) times daily before meals.    Yes  [provider]  ipratropium (ATROVENT) 0.03 % nasal spray SMARTSIG:2 Spray(s) Both Nares 3 Times Daily PRN 03/10/20  Yes [provider]  levothyroxine (SYNTHROID, LEVOTHROID) 50 MCG tablet Take 50 mcg by mouth daily before breakfast.  04/05/17  Yes [provider]  metFORMIN (GLUCOPHAGE) 500 MG tablet Take 500 mg tablet in the am with 0.5 tablet in the pm.   Yes [provider]  methotrexate (RHEUMATREX) 2.5 MG tablet as directed. Take 5 (12.5 mg) tablets by mouth every 7 days. Caution:Chemotherapy. Protect from light.   Yes [provider]  Omega-3 Fatty Acids (FISH OIL PO) Take by mouth 2 (two) times daily.    Yes [provider]  rosuvastatin (CRESTOR) 5 MG tablet Take 2 tablets (10mg ) daily alternating 1 tablet (5 mg) daily 02/19/20  Yes [name], MD  XARELTO 20 MG TABS tablet TAKE ONE TABLET EVERY DAY 04/01/18  Yes [name], MD    Physical Exam: Vitals:   05/26/20 1900 05/26/20 1930 05/26/20 2000 05/26/20 2100  BP: 134/73 (!) 142/116 (!) 126/101 125/60  Pulse: 75 63 85 81   Resp: (!) 22 18 19 19   Temp:      TempSrc:      SpO2: 99% 99% 95% 98%  Weight:      Height:        Constitutional: NAD, calm, comfortable, elderly gentleman sitting upright in bed Vitals:   05/26/20 1900 05/26/20 1930 05/26/20 2000 05/26/20 2100  BP: 134/73 (!) 142/116 (!) 126/101 125/60  Pulse: 75 63 85 81  Resp: (!) 22 18 19 19   Temp:      TempSrc:      SpO2: 99% 99% 95% 98%  Weight:      Height:       Eyes: PERRL, lids and conjunctivae normal ENMT: Mucous membranes are moist.  Neck: normal, supple Respiratory: clear to auscultation bilaterally, no wheezing, no crackles. Normal respiratory effort. No accessory muscle use.  Cardiovascular: Irregularly irregular, no murmurs / rubs / gallops. No extremity edema. 2+ pedal pulses.   Abdomen: no tenderness, no masses palpated.. Bowel sounds positive.  Musculoskeletal: no clubbing / cyanosis. No joint deformity upper and lower extremities. Good ROM, no contractures. Normal muscle tone.  Skin: no rashes, lesions, ulcers. No induration Neurologic: CN 2-12 grossly intact. Sensation intact. Strength 5/5 in all 4.  Psychiatric: Normal judgment and insight. Alert and oriented x 3. Normal pleasant mood.     Labs on Admission: I have personally reviewed following labs and imaging studies  CBC: Recent Labs  Lab 05/26/20 1136  WBC 8.2  NEUTROABS 5.0  HGB 14.2  HCT 40.2  MCV 95.7  PLT 660   Basic Metabolic Panel: Recent Labs  Lab 05/26/20 1136  NA 137  K 4.3  CL 105  CO2 22  GLUCOSE 179*  BUN 29*  CREATININE 1.53*  CALCIUM 9.0  MG 1.7   GFR: Estimated Creatinine Clearance: 46.4 mL/min (A) (by C-G formula based on SCr of 1.53 mg/dL (H)). Liver Function Tests: No results for input(s): AST, ALT, ALKPHOS, BILITOT, PROT, ALBUMIN in the last 168 hours. No results for input(s): LIPASE, AMYLASE in the last 168 hours. No results for input(s): AMMONIA in the last 168 hours. Coagulation Profile: No results for input(s): INR,  PROTIME in the last 168 hours. Cardiac Enzymes: No results for input(s): CKTOTAL, CKMB, CKMBINDEX, TROPONINI in the last 168 hours. BNP (last 3 results) No results for input(s): PROBNP in the last 8760 hours.  HbA1C: No results for input(s): HGBA1C in the last 72 hours. CBG: No results for input(s): GLUCAP in the last 168 hours. Lipid Profile: No results for input(s): CHOL, HDL, LDLCALC, TRIG, CHOLHDL, LDLDIRECT in the last 72 hours. Thyroid Function Tests: No results for input(s): TSH, T4TOTAL, FREET4, T3FREE, THYROIDAB in the last 72 hours. Anemia Panel: No results for input(s): VITAMINB12, FOLATE, FERRITIN, TIBC, IRON, RETICCTPCT in the last 72 hours. Urine analysis:    Component Value Date/Time   COLORURINE Straw 12/31/2012 0905   APPEARANCEUR Clear 12/31/2012 0905   LABSPEC 1.010 12/31/2012 0905   PHURINE 5.0 12/31/2012 0905   GLUCOSEU 50 mg/dL 12/31/2012 0905   HGBUR Negative 12/31/2012 0905   BILIRUBINUR Negative 12/31/2012 0905   KETONESUR Negative 12/31/2012 0905   PROTEINUR Negative 12/31/2012 0905   NITRITE Negative 12/31/2012 0905   LEUKOCYTESUR Negative 12/31/2012 0905    Radiological Exams on Admission: CT Head Wo Contrast  Result Date: 05/26/2020 CLINICAL DATA:  Head injury.  Fall.  Follow-up subdural hematoma EXAM: CT HEAD WITHOUT CONTRAST TECHNIQUE: Contiguous axial images were obtained from the base of the skull through the vertex without intravenous contrast. COMPARISON:  CT head earlier today FINDINGS: Brain: Small interhemispheric subdural hematoma posteriorly appears unchanged. No new area of hemorrhage. Generalized atrophy.  No acute infarct or mass. Vascular: Negative for hyperdense vessel. Skull: Negative for skull fracture. Left parietal scalp hematoma and laceration with staples. Sinuses/Orbits: Paranasal sinuses clear.  Negative orbit Other: None IMPRESSION: Small posterior interhemispheric subdural hematoma is stable. No new area of hemorrhage.  Electronically Signed   By: Franchot Gallo M.D.   On: 05/26/2020 18:53   CT Head Wo Contrast  Addendum Date: 05/26/2020   ADDENDUM REPORT: 05/26/2020 12:49 ADDENDUM: Study discussed by telephone with Dr. Blake Divine on 05/26/2020 at 1225 hours. Electronically Signed   By: Genevie Ann M.D.   On: 05/26/2020 12:49   Result Date: 05/26/2020 CLINICAL DATA:  79 year old male status post syncope and fall in parking lot. Head laceration. EXAM: CT HEAD WITHOUT CONTRAST TECHNIQUE: Contiguous axial images were obtained from the base of the skull through the vertex without intravenous contrast. COMPARISON:  None. FINDINGS: Brain: Trace para falcine subdural hematoma suspected on series 3, images 19 and 21. No other intracranial hemorrhage or extra-axial fluid collection identified. No intracranial mass effect. No ventriculomegaly or intraventricular hemorrhage. Gray-white matter differentiation is within normal limits for age. No cortically based acute infarct identified. No encephalomalacia identified. No evidence of intracranial mass lesion. Vascular: Calcified atherosclerosis at the skull base. No suspicious intracranial vascular hyperdensity. Skull: No fracture or osseous abnormality identified. Incidental occipital bone arachnoid granulations (normal variant). Sinuses/Orbits: Visualized paranasal sinuses and mastoids are clear. Other: Left posterosuperior scalp convexity soft tissue injury with hematoma and trace soft tissue gas. Underlying calvarium appears intact. Other scalp and orbits soft tissues appear negative. IMPRESSION: 1. Trace amount of parafalcine subdural hematoma. No intracranial mass effect and no other acute traumatic injury to the brain identified. 2. Left posterosuperior scalp soft tissue injury without underlying skull fracture. Electronically Signed: By: Genevie Ann M.D. On: 05/26/2020 12:19   CT Cervical Spine Wo Contrast  Result Date: 05/26/2020 CLINICAL DATA:  79 year old male status post  syncope and fall in parking lot. Head laceration. EXAM: CT CERVICAL SPINE WITHOUT CONTRAST TECHNIQUE: Multidetector CT imaging of the cervical spine was performed without intravenous contrast. Multiplanar CT image reconstructions were also generated. COMPARISON:  Head CT today reported separately. FINDINGS: Alignment: Straightening of cervical lordosis with subtle anterolisthesis  at both C2-C3 and C4-C5, which appears chronic and is associated with facet arthropathy at both levels. Cervicothoracic junction alignment is within normal limits. Bilateral posterior element alignment is within normal limits. Skull base and vertebrae: Visualized skull base is intact. No atlanto-occipital dissociation. No acute osseous abnormality identified. Soft tissues and spinal canal: No prevertebral fluid or swelling. No visible canal hematoma. Negative noncontrast neck soft tissues. Disc levels: Bulky degenerative changes at the craniocervical junction including ligamentous hypertrophy and dystrophic calcification / ossification about the odontoid. Small chronic appearing endplate ossific fragments also superiorly at C4. Widespread disc bulging and endplate spurring. With up to mild degenerative cervical spinal stenosis. Upper chest: Visible upper thoracic levels appear intact. Partially visible left subclavian pacemaker type leads. Mild emphysema in the lung apices. Other: Head CT today reported separately. IMPRESSION: Widespread advanced cervical spine degeneration but no acute traumatic injury identified. Electronically Signed   By: Genevie Ann M.D.   On: 05/26/2020 12:24      Assessment/Plan  Syncope Suspect likely due to arrhythmia given history of the same and has AICD Unfortunately unable to get data transmitter last night during interrogation.  Will reattempt interrogation of AICD in the morning. Keep on telemetry Last echocardiogram in August 2020 showed EF of 30 to 35%.  Obtain updated echo. 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 Izora Ribas with neurosurgery   AKI Baseline around 1.3-1.45 Hold enalapril due to AKI Has received 1L of IV LR in ED. Will repeat BMP in morning and hold on further fluids due to hx of systolic CHF.  Permanent atrial fibrillation  rate controlled Hold Xarelto due to [place]  Hx of CAD s/p CABG Continue Lasix, eplerenone, statin  Hx of systolic CHF appears evolemic   Insulin dependent Type 2 diabetes At home takes 30 units of regular insulin with meals and 90 units at bedtime of NPH Start with 60 units of Lantus qHS and 15 units TID, resistant SSI  Hypothyroidism Check TSH Continue levothyroxine  Contact dermatitis of the LE  topical steroid cream  DVT prophylaxis: SCDs Code Status: Full Family Communication: Plan discussed with patient at bedside  disposition Plan: Home with observation Consults called: Neurosurgery Admission status: Observation  Status is: Observation  The patient remains OBS appropriate and will d/c before 2 midnights.  Dispo: The patient is from: Home              Anticipated d/c is to: Home              Anticipated d/c date is: 1 day              Patient currently is not medically stable to d/c.         Orene Desanctis DO [facility]   If 7PM-7AM, please contact night-coverage [email or web address]   05/26/2020, 11:30 PM

## 2020-05-26 NOTE — ED Notes (Signed)
[employer] interrogation not completed due to machine not being able to connect to cellular network to transmit. Call placed to [facility] local rep who stated that they did not have anyone available to send out to this facility at this time. EDP made aware.

## 2020-05-26 NOTE — ED Provider Notes (Signed)
[facility] Emergency Department Provider Note   ____________________________________________   First MD Initiated Contact with Patient 05/26/20 1132     (approximate)  I have reviewed the triage vital signs and the nursing notes.   HISTORY  Chief Complaint Loss of Consciousness    HPI Andres NEPHEW Sr. is a 79 y.o. 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  Patient reports that he remembers walking out of the home-improvement store but does not remember what happens next.  EMS was called to find patient collapsed on the ground after hitting his head following a syncopal episode.  He has a laceration to the back of his head and was slightly disoriented during transport, now awake and alert.  He denies any headache, neck pain, numbness, or weakness.  He has otherwise been feeling well recently with no chest pain or shortness of breath.        Past Medical History:  Diagnosis Date  . Arthritis   . Atrial fibrillation, persistent-long-term   . Chronic systolic congestive heart failure ([place])    a. 03/2015 Echo: EF 30-35%, diff HK, mild MR, mod dil LA, mildly dil RA, mild TR.  [name] Coronary artery disease    a. 1994 s/p CABG x 1 (VG->OM3);  b. 10/2015 Cath: LM nl, LAD min irregs, D1 nl, D2 min irregs, LCX 67m, OM2 nl, OM3 90 (small territory), VG->OM3 100, RCA min irregs, RPDA/RPL1/RPL2/RPL3 nl, EF 25%-->Med Rx.  [name] Hx of squamous cell carcinoma of skin 06/04/2013   L upper arm  . Hyperlipidemia   . ICD (implantable cardiac defibrillator) in place    a. 02/2012 s/p SJM 1257-40Q Fortify Assurance single lead AICD.  [name] Mixed Ischemic and Non-ischemic Cardiomyopathy    a. 07/2012 s/p SJM AICD (RV lead only);  b. 03/2015 Echo: EF 30-35%, diff HK;  c. EF 25% by LV gram.  . Nephrolithiasis  1970's  . Polymyalgia rheumatica (HCC)    On steroids  . Sleep apnea    On CPAP  . Type II diabetes mellitus ([place])   . Ventricular tachycardia -inducible at EP testing    a. 07/2012 s/ SJM AICD.    Patient Active Problem List   Diagnosis Date Noted  . Coronary artery disease involving native coronary artery   . Hx of squamous cell carcinoma of skin 06/04/2013  . Bronchitis 01/28/2013  . Atrial fibrillation, persistent-long-term   . Ventricular tachycardia -inducible at EP testing   . Automatic implantable cardioverter-defibrillator in situ   . Coronary artery disease   . Hyperlipidemia   . Ischemic cardiomyopathy   . Chronic systolic congestive heart failure [facility])     Past Surgical History:  Procedure Laterality Date  . basel cell removal     left arm  . CARDIAC CATHETERIZATION  02/28/2012   Minor disease in LAD and RCA, LCX: 70% mid and occluded distally. Patent SVG to OM3  . CARDIAC CATHETERIZATION N/A 11/08/2015   Procedure: Left Heart Cath and Cors/Grafts Angiography;  Surgeon: [name], MD;  Location: [place] CV LAB;  Service: Cardiovascular;  Laterality: N/A;  . CARDIOVERSION  03/01/2012   Procedure: CARDIOVERSION;  Surgeon: Deboraha Sprang, MD;  Location: [place];  Service: Cardiovascular;  Laterality: N/A;  . CATARACT EXTRACTION W/ INTRAOCULAR LENS  IMPLANT, BILATERAL  2011  . CORONARY ARTERY BYPASS GRAFT  [place] ; CABG X1  . ELECTROPHYSIOLOGY STUDY N/A 02/28/2012   Procedure: ELECTROPHYSIOLOGY STUDY;  Surgeon: Deboraha Sprang, MD;  Location: [facility] CATH LAB;  Service: Cardiovascular;  Laterality: N/A;  . ICD GENERATOR CHANGEOUT N/A 03/18/2018   Procedure: [place];  Surgeon: Evans Lance, MD;  Location: [place] CV LAB;  Service: Cardiovascular;  Laterality: N/A;  . INSERT / REPLACE / REMOVE PACEMAKER  02/28/12   "w/defibrillator"  . JOINT REPLACEMENT    . KNEE ARTHROSCOPY  2012   right  . LEFT HEART CATHETERIZATION WITH CORONARY/GRAFT  ANGIOGRAM N/A 02/28/2012   Procedure: LEFT HEART CATHETERIZATION WITH [name];  Surgeon: [name], MD;  Location: [place] CATH LAB;  Service: Cardiovascular;  Laterality: N/A;  . TOTAL KNEE ARTHROPLASTY  2011   left    Prior to Admission medications   Medication Sig Start Date End Date Taking? Authorizing Provider  carvedilol (COREG) 6.25 MG tablet TAKE ONE TABLET TWICE DAILY 11/29/17   [name], MD  Cinnamon 500 MG TABS Take 1,000 mg by mouth 2 (two) times daily.     [provider]  Coenzyme Q10 (CO Q 10 PO) Take 300 mg by mouth daily.    [provider]  cyclobenzaprine (FLEXERIL) 5 MG tablet Take 5 mg by mouth 3 (three) times daily as needed for muscle spasms.    [provider]  diphenhydramine-acetaminophen (TYLENOL PM) 25-500 MG TABS tablet Take 2 tablets by mouth at bedtime as needed.    [provider]  enalapril (VASOTEC) 2.5 MG tablet Take 2.5 mg by mouth daily. 03/07/16   [provider]  eplerenone (INSPRA) 25 MG tablet TAKE ONE-HALF TABLET BY MOUTH EVERY DAY 10/01/19   Deboraha Sprang, MD  fluticasone [facility]) 50 MCG/ACT nasal spray Place into the nose daily. 06/06/18   [provider]  folic acid (FOLVITE) 440 MCG tablet Take 400 mcg by mouth daily.    [provider]  furosemide (LASIX) 20 MG tablet TAKE ONE TABLET EVERY DAY 03/17/20   [name], MD  GARLIC PO Take 1 capsule by mouth daily.     [provider]  Glucosamine-Chondroit-Vit C-Mn (GLUCOSAMINE CHONDR 1500 COMPLX PO) Take 1,500 mg by mouth 2 (two) times daily.     [provider]  insulin NPH Human (HUMULIN N,NOVOLIN N) 100 UNIT/ML injection Inject 95 Units into the skin at bedtime.     [provider]  insulin regular (NOVOLIN R,HUMULIN R) 100 units/mL injection Inject 30 Units into the skin 3 (three) times daily before meals.     [provider]  levothyroxine (SYNTHROID, LEVOTHROID) 50 MCG  tablet Take 50 mcg by mouth daily before breakfast.  04/05/17   [provider]  metFORMIN (GLUCOPHAGE) 500 MG tablet Take 500 mg tablet in the am with 0.5 tablet in the pm.    [provider]  methotrexate (RHEUMATREX) 2.5 MG tablet Take by mouth as directed. Caution:Chemotherapy. Protect from light.    [provider]  Omega-3 Fatty Acids (FISH OIL PO) Take by mouth 2 (two) times daily.     [provider]  omeprazole (PRILOSEC) 20 MG capsule Take by mouth daily. 06/11/18   [provider]  pioglitazone (ACTOS) 15 MG tablet Take 15 mg by mouth daily.    [provider]  rosuvastatin (CRESTOR) 5 MG tablet Take 2 tablets (10mg ) daily alternating 1 tablet (5 mg) daily 02/19/20   [name], MD  XARELTO 20 MG TABS tablet TAKE ONE TABLET EVERY DAY 04/01/18   [name], MD    Allergies Patient has no known allergies.  Family History  Problem Relation Age of Onset  . Heart attack Father   . Heart attack Brother     Social History Social History   Tobacco Use  . Smoking status: Former Smoker    Packs/day: 1.00    Years: 25.00    Pack years: 25.00    Types: Cigarettes    Quit date: 01/09/1988    Years since quitting: 32.4  . Smokeless tobacco: Never Used  Substance Use Topics  . Alcohol use: No  . Drug use: No    Review of Systems  Constitutional: No fever/chills Eyes: No visual changes. ENT: No sore throat. Cardiovascular: Denies chest pain.  Positive for syncope. Respiratory: Denies shortness of breath. Gastrointestinal: No abdominal pain.  No nausea, no vomiting.  No diarrhea.  No constipation. Genitourinary: Negative for dysuria. Musculoskeletal: Negative for back pain. Skin: Negative for rash.  Positive for scalp laceration. Neurological: Negative for headaches, focal weakness or numbness.  ____________________________________________   PHYSICAL EXAM:  VITAL SIGNS: ED Triage Vitals  Enc Vitals Group      BP --      Pulse --      Resp --      Temp --      Temp src --      SpO2 --      Weight 05/26/20 1132 220 lb 3.8 oz (99.9 kg)     Height 05/26/20 1132 5\' 10"  (1.778 m)     Head Circumference --      Peak Flow --      Pain Score 05/26/20 1131 8     Pain Loc --      Pain Edu? --      Excl. in [place]? --     Constitutional: Alert and oriented. Eyes: Conjunctivae are normal. Head: Irregular stellate laceration to posterior scalp. Nose: No congestion/rhinnorhea. Mouth/Throat: Mucous membranes are moist. Neck: Normal ROM Cardiovascular: Normal rate, irregularly irregular rhythm. Grossly normal heart sounds. Respiratory: Normal respiratory effort.  No retractions. Lungs CTAB. Gastrointestinal: Soft and nontender. No distention. Genitourinary: deferred Musculoskeletal: No lower extremity tenderness nor edema. Neurologic:  Normal speech and language. No gross focal neurologic deficits are appreciated. Skin:  Skin is warm, dry and intact. No rash noted. Psychiatric: Mood and affect are normal. Speech and behavior are normal.  ____________________________________________   LABS (all labs ordered are listed, but only abnormal results are displayed)  Labs Reviewed  BASIC METABOLIC PANEL - Abnormal; Notable for the following components:      Result Value   Glucose, Bld 179 (*)    BUN 29 (*)    Creatinine, Ser 1.53 (*)    GFR calc non Af Amer 43 (*)    GFR calc Af Amer 49 (*)    All other components within normal limits  CBC WITH DIFFERENTIAL/PLATELET - Abnormal; Notable for the following components:   RBC 4.20 (*)    RDW 16.2 (*)    All other components within normal limits  SARS CORONAVIRUS 2 BY RT PCR (HOSPITAL ORDER, [place] [facility])  MAGNESIUM  TROPONIN I (HIGH SENSITIVITY)  TROPONIN I (HIGH SENSITIVITY)   ____________________________________________  EKG  ED ECG REPORT I, Blake Divine, the attending physician, personally viewed and  interpreted this ECG.   Date: 05/26/2020  EKG Time: 11:33  Rate: 77  Rhythm: atrial  fibrillation, rate 77  Axis: Normal  Intervals:none  ST&T Change: None   PROCEDURES  Procedure(s) performed (including Critical Care):  .Critical Care Performed by: Blake Divine, MD Authorized by: Blake Divine, MD   Critical care provider statement:    Critical care time (minutes):  45   Critical care time was exclusive of:  Separately billable procedures and treating other patients and teaching time   Critical care was necessary to treat or prevent imminent or life-threatening deterioration of the following conditions:  Cardiac failure and CNS failure or compromise   Critical care was time spent personally by me on the following activities:  Discussions with consultants, evaluation of patient's response to treatment, examination of patient, ordering and performing treatments and interventions, ordering and review of laboratory studies, ordering and review of radiographic studies, pulse oximetry, re-evaluation of patient's condition, obtaining history from patient or surrogate and review of old charts   I assumed direction of critical care for this patient from another provider in my specialty: no   .Marland KitchenLaceration Repair  Date/Time: 05/26/2020 4:28 PM Performed by: Blake Divine, MD Authorized by: Blake Divine, MD   Consent:    Consent obtained:  Verbal   Consent given by:  Patient Anesthesia (see MAR for exact dosages):    Anesthesia method:  Local infiltration   Local anesthetic:  Lidocaine 2% WITH epi Laceration details:    Location:  Scalp   Scalp location:  Occipital   Length (cm):  4 Repair type:    Repair type:  Simple Pre-procedure details:    Preparation:  Patient was prepped and draped in usual sterile fashion and imaging obtained to evaluate for foreign bodies Exploration:    Contaminated: no   Treatment:    Area cleansed with:  Saline   Amount of cleaning:   Standard   Irrigation solution:  Sterile saline   Irrigation method:  Pressure wash   Visualized foreign bodies/material removed: no   Skin repair:    Repair method:  Staples   Number of staples:  7 Approximation:    Approximation:  Loose Post-procedure details:    Dressing:  Open (no dressing)   Patient tolerance of procedure:  Tolerated well, no immediate complications     ____________________________________________   INITIAL IMPRESSION / ASSESSMENT AND PLAN / ED COURSE       79 year old male with past medical history of permanent atrial fibrillation on Xarelto, CAD status post CABG, ischemic cardiomyopathy, hyperlipidemia, and polymyalgia rheumatica presents to the ED following syncopal episode where he fell back and hit his head.  He has an irregular stellate laceration to his occipital scalp that was repaired with staples.  CT head reveals very small parafalcine subdural hematoma with no mass-effect or midline shift.  Patient has no focal neurologic deficits on exam and case was discussed with neurosurgery.  Neurosurgery evaluated the patient here in the ED and states that if repeat head CT remained stable in 6 hours, he would be cleared from their perspective.  He will need to remain off of blood thinners for 2 weeks given this finding.  Patient does have a history of ventricular tachycardia and has AICD in place.  EKG shows his known atrial fibrillation with no ischemic changes, rate is controlled at this time and blood pressure stable.  Interrogation of his AICD was attempted, unfortunately equipment was unable to transmit.  Lab work is unremarkable and troponin within normal limits.  Case discussed with hospitalist for admission, who prefers to wait until repeat head  CT shows SDH to be stable prior to admission.  Oncoming provider will discuss with hospitalist once CT head is resulted.      ____________________________________________   FINAL CLINICAL IMPRESSION(S) / ED  DIAGNOSES  Final diagnoses:  Syncope and collapse  SDH (subdural hematoma) (HCC)  Permanent atrial fibrillation [facility])     ED Discharge Orders    None       Note:  This document was prepared using Dragon voice recognition software and may include unintentional dictation errors.   Blake Divine, MD 05/26/20 [phone]

## 2020-05-26 NOTE — ED Notes (Signed)
Pt watching tv.  Family with pt   Pt alert.

## 2020-05-26 NOTE — ED Notes (Signed)
Report off to noah rn 

## 2020-05-26 NOTE — Consult Note (Signed)
Referring Physician:  No referring provider defined for this encounter.  Primary Physician:  [name], MD  Chief Complaint:  parafalcine subdural hematoma  History of Present Illness: 05/26/2020 [name] is a 79 y.o. male who presents with the chief complaint of fall. He had a syncopal event.  He has an AICD implanted, and is on eliquis for atrial fibrillation.  He does not remember falling outside [place] today.  He came to while he was in the ambulance.  He is now feeling at his baseline without any headache, nausea, or vomiting.  He has no deficits, and feels his cognition is normal.   Review of Systems:  A 10 point review of systems is negative, except for the pertinent positives and negatives detailed in the HPI.  Past Medical History: Past Medical History:  Diagnosis Date  . Arthritis   . Atrial fibrillation, persistent-long-term   . Chronic systolic congestive heart failure ([place])    a. 03/2015 Echo: EF 30-35%, diff HK, mild MR, mod dil LA, mildly dil RA, mild TR.  [name] Coronary artery disease    a. 1994 s/p CABG x 1 (VG->OM3);  b. 10/2015 Cath: LM nl, LAD min irregs, D1 nl, D2 min irregs, LCX 58m, OM2 nl, OM3 90 (small territory), VG->OM3 100, RCA min irregs, RPDA/RPL1/RPL2/RPL3 nl, EF 25%-->Med Rx.  [name] Hx of squamous cell carcinoma of skin 06/04/2013   L upper arm  . Hyperlipidemia   . ICD (implantable cardiac defibrillator) in place    a. 02/2012 s/p SJM 1257-40Q Fortify Assurance single lead AICD.  [name] Mixed Ischemic and Non-ischemic Cardiomyopathy    a. 07/2012 s/p SJM AICD (RV lead only);  b. 03/2015 Echo: EF 30-35%, diff HK;  c. EF 25% by LV gram.  . Nephrolithiasis 1970's  . Polymyalgia rheumatica (HCC)    On steroids  . Sleep apnea    On CPAP  . Type II diabetes mellitus ([place])   . Ventricular tachycardia -inducible at EP testing    a. 07/2012 s/ SJM AICD.    Past Surgical History: Past Surgical History:  Procedure Laterality Date  . basel cell  removal     left arm  . CARDIAC CATHETERIZATION  02/28/2012   Minor disease in LAD and RCA, LCX: 70% mid and occluded distally. Patent SVG to OM3  . CARDIAC CATHETERIZATION N/A 11/08/2015   Procedure: Left Heart Cath and Cors/Grafts Angiography;  Surgeon: [name], MD;  Location: [place] CV LAB;  Service: Cardiovascular;  Laterality: N/A;  . CARDIOVERSION  03/01/2012   Procedure: CARDIOVERSION;  Surgeon: Deboraha Sprang, MD;  Location: [place];  Service: Cardiovascular;  Laterality: N/A;  . CATARACT EXTRACTION W/ INTRAOCULAR LENS  IMPLANT, BILATERAL  2011  . CORONARY ARTERY BYPASS GRAFT  1994   [place] ; CABG X1  . ELECTROPHYSIOLOGY STUDY N/A 02/28/2012   Procedure: ELECTROPHYSIOLOGY STUDY;  Surgeon: Deboraha Sprang, MD;  Location: [facility] CATH LAB;  Service: Cardiovascular;  Laterality: N/A;  . ICD GENERATOR CHANGEOUT N/A 03/18/2018   Procedure: [place];  Surgeon: Evans Lance, MD;  Location: [place] CV LAB;  Service: Cardiovascular;  Laterality: N/A;  . INSERT / REPLACE / REMOVE PACEMAKER  02/28/12   "w/defibrillator"  . JOINT REPLACEMENT    . KNEE ARTHROSCOPY  2012   right  . LEFT HEART CATHETERIZATION WITH CORONARY/GRAFT ANGIOGRAM N/A 02/28/2012   Procedure: LEFT HEART CATHETERIZATION WITH [name];  Surgeon: [name], MD;  Location: [place] CATH LAB;  Service: Cardiovascular;  Laterality: N/A;  . TOTAL KNEE ARTHROPLASTY  2011   left    Allergies: Allergies as of 05/26/2020  . (No Known Allergies)    Medications:  Current Facility-Administered Medications:  .  lidocaine-EPINEPHrine (XYLOCAINE W/EPI) 2 %-1:100000 (with pres) injection 20 mL, 20 mL, Infiltration, Once, Blake Divine, MD  Current Outpatient Medications:  .  carvedilol (COREG) 6.25 MG tablet, TAKE ONE TABLET TWICE DAILY, Disp: 60 tablet, Rfl: 6 .  Cinnamon 500 MG TABS, Take 1,000 mg by mouth 2 (two) times daily. , Disp: , Rfl:  .  Coenzyme Q10 (CO Q 10 PO), Take 300 mg by  mouth daily., Disp: , Rfl:  .  cyclobenzaprine (FLEXERIL) 5 MG tablet, Take 5 mg by mouth 3 (three) times daily as needed for muscle spasms., Disp: , Rfl:  .  diphenhydramine-acetaminophen (TYLENOL PM) 25-500 MG TABS tablet, Take 2 tablets by mouth at bedtime as needed., Disp: , Rfl:  .  enalapril (VASOTEC) 2.5 MG tablet, Take 2.5 mg by mouth daily., Disp: , Rfl:  .  eplerenone (INSPRA) 25 MG tablet, TAKE ONE-HALF TABLET BY MOUTH EVERY DAY, Disp: 45 tablet, Rfl: 2 .  fluticasone (FLONASE) 50 MCG/ACT nasal spray, Place into the nose daily., Disp: , Rfl:  .  folic acid (FOLVITE) 614 MCG tablet, Take 400 mcg by mouth daily., Disp: , Rfl:  .  furosemide (LASIX) 20 MG tablet, TAKE ONE TABLET EVERY DAY, Disp: 30 tablet, Rfl: 5 .  GARLIC PO, Take 1 capsule by mouth daily. , Disp: , Rfl:  .  Glucosamine-Chondroit-Vit C-Mn (GLUCOSAMINE CHONDR 1500 COMPLX PO), Take 1,500 mg by mouth 2 (two) times daily. , Disp: , Rfl:  .  insulin NPH Human (HUMULIN N,NOVOLIN N) 100 UNIT/ML injection, Inject 95 Units into the skin at bedtime. , Disp: , Rfl:  .  insulin regular (NOVOLIN R,HUMULIN R) 100 units/mL injection, Inject 30 Units into the skin 3 (three) times daily before meals. , Disp: , Rfl:  .  levothyroxine (SYNTHROID, LEVOTHROID) 50 MCG tablet, Take 50 mcg by mouth daily before breakfast. , Disp: , Rfl:  .  metFORMIN (GLUCOPHAGE) 500 MG tablet, Take 500 mg tablet in the am with 0.5 tablet in the pm., Disp: , Rfl:  .  methotrexate (RHEUMATREX) 2.5 MG tablet, Take by mouth as directed. Caution:Chemotherapy. Protect from light., Disp: , Rfl:  .  Omega-3 Fatty Acids (FISH OIL PO), Take by mouth 2 (two) times daily. , Disp: , Rfl:  .  omeprazole (PRILOSEC) 20 MG capsule, Take by mouth daily., Disp: , Rfl:  .  pioglitazone (ACTOS) 15 MG tablet, Take 15 mg by mouth daily., Disp: , Rfl:  .  rosuvastatin (CRESTOR) 5 MG tablet, Take 2 tablets (10mg ) daily alternating 1 tablet (5 mg) daily, Disp: 45 tablet, Rfl: 11 .   XARELTO 20 MG TABS tablet, TAKE ONE TABLET EVERY DAY, Disp: 30 tablet, Rfl: 3   Social History: Social History   Tobacco Use  . Smoking status: Former Smoker    Packs/day: 1.00    Years: 25.00    Pack years: 25.00    Types: Cigarettes    Quit date: 01/09/1988    Years since quitting: 32.4  . Smokeless tobacco: Never Used  Substance Use Topics  . Alcohol use: No  . Drug use: No    Family Medical History: Family History  Problem Relation Age of Onset  . Heart attack Father   . Heart attack Brother     Physical Examination:  Vitals:   05/26/20 1546 05/26/20 1547  BP:    Pulse: 83 84  Resp: 20 (!) 25  Temp:    SpO2: 98% 96%     General: Patient is well developed, well nourished, calm, collected, and in no apparent distress.  Psychiatric: Patient is non-anxious.  Head:  Pupils equal, round, and reactive to light.  ENT:  Oral mucosa appears well hydrated.  Neck:   Supple.  Full range of motion.  Respiratory: Patient is breathing without any difficulty.  Extremities: No edema.  Vascular: Palpable pulses in dorsal pedal vessels.  Skin:   On exposed skin, there are no abnormal skin lesions.  NEUROLOGICAL:  General: In no acute distress.   Awake, alert, oriented to person, place, and time.  Pupils equal round and reactive to light.  Facial tone is symmetric.  Tongue protrusion is midline.  There is no pronator drift.   Strength: Side Biceps Triceps Deltoid Interossei Grip Wrist Ext. Wrist Flex.  R 5 5 5 5 5 5 5   L 5 5 5 5 5 5 5    Side Iliopsoas Quads Hamstring PF DF EHL  R 5 5 5 5 5 5   L 5 5 5 5 5 5    Reflexes are 1+ and symmetric at the biceps, triceps, brachioradialis, patella and achilles.   Bilateral upper and lower extremity sensation is intact to light touch and pin prick.  Clonus is not present.  Toes are down-going.  Gait is untested - on monitoring.  Hoffman's is absent.  Imaging: CT Head 05/26/20 IMPRESSION: 1. Trace amount of parafalcine  subdural hematoma. No intracranial mass effect and no other acute traumatic injury to the brain identified.  2. Left posterosuperior scalp soft tissue injury without underlying skull fracture.  Electronically Signed: By: Genevie Ann M.D. On: 05/26/2020 12:19  CT C spine 05/26/20 IMPRESSION: Widespread advanced cervical spine degeneration but no acute traumatic injury identified.   Electronically Signed   By: Genevie Ann M.D.   On: 05/26/2020 12:24 I have personally reviewed the images and agree with the above interpretation.  Labs: CBC Latest Ref Rng & Units 05/26/2020 03/18/2018 11/01/2015  WBC 4.0 - 10.5 K/uL 8.2 6.3 8.5  Hemoglobin 13.0 - 17.0 g/dL 14.2 14.3 14.2  Hematocrit 39 - 52 % 40.2 43.2 43.3  Platelets 150 - 400 K/uL 206 202 204       Assessment and Plan: Mr. Vanatta is a pleasant 79 y.o. male with small parafalcine SDH, now GCS 15.  - hold eliquis for 2 weeks - repeat CT for stability - no AEDs needed given small ICH - syncopal workup per ED  I have discussed the condition with the patient, including showing the radiographs and discussing treatment options in layman's terms.    We will arrange follow up.    Dorrance Sellick K. Izora Ribas MD, [place] Dept. of Neurosurgery

## 2020-05-27 ENCOUNTER — Other Ambulatory Visit: Payer: Self-pay

## 2020-05-27 ENCOUNTER — Inpatient Hospital Stay (HOSPITAL_COMMUNITY)
Admit: 2020-05-27 | Discharge: 2020-05-27 | Disposition: A | Discharge status: Home / Self Care | Payer: Medicare Other | Attending: Family Medicine | Admitting: Family Medicine

## 2020-05-27 DIAGNOSIS — Z794 Long term (current) use of insulin: Secondary | ICD-10-CM

## 2020-05-27 DIAGNOSIS — Z87891 Personal history of nicotine dependence: Secondary | ICD-10-CM | POA: Diagnosis not present

## 2020-05-27 DIAGNOSIS — Z8249 Family history of ischemic heart disease and other diseases of the circulatory system: Secondary | ICD-10-CM | POA: Diagnosis not present

## 2020-05-27 DIAGNOSIS — Z95810 Presence of automatic (implantable) cardiac defibrillator: Secondary | ICD-10-CM

## 2020-05-27 DIAGNOSIS — S065X9A Traumatic subdural hemorrhage with loss of consciousness of unspecified duration, initial encounter: Secondary | ICD-10-CM | POA: Diagnosis present

## 2020-05-27 DIAGNOSIS — I361 Nonrheumatic tricuspid (valve) insufficiency: Secondary | ICD-10-CM | POA: Diagnosis not present

## 2020-05-27 DIAGNOSIS — R55 Syncope and collapse: Secondary | ICD-10-CM

## 2020-05-27 DIAGNOSIS — Z7901 Long term (current) use of anticoagulants: Secondary | ICD-10-CM | POA: Diagnosis not present

## 2020-05-27 DIAGNOSIS — Z96652 Presence of left artificial knee joint: Secondary | ICD-10-CM | POA: Diagnosis present

## 2020-05-27 DIAGNOSIS — I2510 Atherosclerotic heart disease of native coronary artery without angina pectoris: Secondary | ICD-10-CM | POA: Diagnosis present

## 2020-05-27 DIAGNOSIS — I420 Dilated cardiomyopathy: Secondary | ICD-10-CM | POA: Diagnosis present

## 2020-05-27 DIAGNOSIS — I4819 Other persistent atrial fibrillation: Secondary | ICD-10-CM | POA: Diagnosis not present

## 2020-05-27 DIAGNOSIS — E1165 Type 2 diabetes mellitus with hyperglycemia: Secondary | ICD-10-CM | POA: Diagnosis present

## 2020-05-27 DIAGNOSIS — Z87442 Personal history of urinary calculi: Secondary | ICD-10-CM | POA: Diagnosis not present

## 2020-05-27 DIAGNOSIS — M353 Polymyalgia rheumatica: Secondary | ICD-10-CM | POA: Diagnosis present

## 2020-05-27 DIAGNOSIS — N179 Acute kidney failure, unspecified: Secondary | ICD-10-CM | POA: Diagnosis present

## 2020-05-27 DIAGNOSIS — I472 Ventricular tachycardia: Secondary | ICD-10-CM

## 2020-05-27 DIAGNOSIS — I130 Hypertensive heart and chronic kidney disease with heart failure and stage 1 through stage 4 chronic kidney disease, or unspecified chronic kidney disease: Secondary | ICD-10-CM | POA: Diagnosis present

## 2020-05-27 DIAGNOSIS — Z9842 Cataract extraction status, left eye: Secondary | ICD-10-CM | POA: Diagnosis not present

## 2020-05-27 DIAGNOSIS — E1122 Type 2 diabetes mellitus with diabetic chronic kidney disease: Secondary | ICD-10-CM | POA: Diagnosis present

## 2020-05-27 DIAGNOSIS — Z85828 Personal history of other malignant neoplasm of skin: Secondary | ICD-10-CM | POA: Diagnosis not present

## 2020-05-27 DIAGNOSIS — Z961 Presence of intraocular lens: Secondary | ICD-10-CM | POA: Diagnosis present

## 2020-05-27 DIAGNOSIS — Z20822 Contact with and (suspected) exposure to covid-19: Secondary | ICD-10-CM | POA: Diagnosis present

## 2020-05-27 DIAGNOSIS — E785 Hyperlipidemia, unspecified: Secondary | ICD-10-CM | POA: Diagnosis present

## 2020-05-27 DIAGNOSIS — I5022 Chronic systolic (congestive) heart failure: Secondary | ICD-10-CM | POA: Diagnosis present

## 2020-05-27 DIAGNOSIS — E119 Type 2 diabetes mellitus without complications: Secondary | ICD-10-CM

## 2020-05-27 DIAGNOSIS — I4821 Permanent atrial fibrillation: Secondary | ICD-10-CM | POA: Diagnosis present

## 2020-05-27 DIAGNOSIS — Z9841 Cataract extraction status, right eye: Secondary | ICD-10-CM | POA: Diagnosis not present

## 2020-05-27 DIAGNOSIS — N1831 Chronic kidney disease, stage 3a: Secondary | ICD-10-CM | POA: Diagnosis present

## 2020-05-27 LAB — GLUCOSE, CAPILLARY
Glucose-Capillary: 200 mg/dL — ABNORMAL HIGH (ref 70–99)
Glucose-Capillary: 195 mg/dL — ABNORMAL HIGH (ref 70–99)
Glucose-Capillary: 199 mg/dL — ABNORMAL HIGH (ref 70–99)
Glucose-Capillary: 179 mg/dL — ABNORMAL HIGH (ref 70–99)
Glucose-Capillary: 304 mg/dL — ABNORMAL HIGH (ref 70–99)
Glucose-Capillary: 277 mg/dL — ABNORMAL HIGH (ref 70–99)
Glucose-Capillary: 179 mg/dL — ABNORMAL HIGH (ref 70–99)
Glucose-Capillary: 128 mg/dL — ABNORMAL HIGH (ref 70–99)

## 2020-05-27 LAB — BASIC METABOLIC PANEL
Anion gap: 8 (ref 5–15)
BUN: 25 mg/dL — ABNORMAL HIGH (ref 8–23)
CO2: 24 mmol/L (ref 22–32)
Calcium: 9.1 mg/dL (ref 8.9–10.3)
Chloride: 103 mmol/L (ref 98–111)
Creatinine, Ser: 1.27 mg/dL — ABNORMAL HIGH (ref 0.61–1.24)
GFR calc Af Amer: >60 mL/min (ref >60)
GFR calc non Af Amer: 53 mL/min — ABNORMAL LOW (ref >60)
Glucose, Bld: 220 mg/dL — ABNORMAL HIGH (ref 70–99)
Potassium: 4.3 mmol/L (ref 3.5–5.1)
Sodium: 135 mmol/L (ref 135–145)

## 2020-05-27 LAB — VITAMIN B12: Vitamin B-12: 1005 pg/mL — ABNORMAL HIGH (ref 180–914)

## 2020-05-27 LAB — CBC
HCT: 41.1 % (ref 39.0–52.0)
Hemoglobin: 14.3 g/dL (ref 13.0–17.0)
MCH: 34.2 pg — ABNORMAL HIGH (ref 26.0–34.0)
MCHC: 34.8 g/dL (ref 30.0–36.0)
MCV: 98.3 fL (ref 80.0–100.0)
Platelets: 191 10*3/uL (ref 150–400)
RBC: 4.18 MIL/uL — ABNORMAL LOW (ref 4.22–5.81)
RDW: 15.9 % — ABNORMAL HIGH (ref 11.5–15.5)
WBC: 10.5 10*3/uL (ref 4.0–10.5)
nRBC: 0 % (ref 0.0–0.2)

## 2020-05-27 LAB — ECHOCARDIOGRAM COMPLETE
Height: 70 in
Weight: 3536 oz

## 2020-05-27 LAB — TROPONIN I (HIGH SENSITIVITY)
Troponin I (High Sensitivity): 24 ng/L — ABNORMAL HIGH (ref <18)
Troponin I (High Sensitivity): 20 ng/L — ABNORMAL HIGH (ref <18)
Troponin I (High Sensitivity): 19 ng/L — ABNORMAL HIGH (ref <18)

## 2020-05-27 LAB — TSH: TSH: 1.691 u[IU]/mL (ref 0.350–4.500)

## 2020-05-27 MED ORDER — PERFLUTREN LIPID MICROSPHERE
1.0000 mL | INTRAVENOUS | Status: AC | PRN
  Administered 2020-05-27: 2 mL via INTRAVENOUS
  Filled 2020-05-27: qty 10

## 2020-05-27 MED ORDER — ACETAMINOPHEN 500 MG PO TABS
1000.0000 mg | ORAL_TABLET | Freq: Four times a day (QID) | ORAL | Status: DC | PRN
  Administered 2020-05-28 (×2): 1000 mg via ORAL
  Filled 2020-05-27 (×3): qty 2

## 2020-05-27 MED ORDER — ACETAMINOPHEN 500 MG PO TABS
ORAL_TABLET | ORAL | Status: AC
  Administered 2020-05-27: 1000 mg via ORAL
  Filled 2020-05-27: qty 2

## 2020-05-27 MED ORDER — MAGNESIUM SULFATE 2 GM/50ML IV SOLN
2.0000 g | Freq: Once | INTRAVENOUS | Status: DC
  Filled 2020-05-27: qty 50

## 2020-05-27 NOTE — Progress Notes (Signed)
PROGRESS NOTE    Andres RAIA Sr.  GMW:[id number] DOB: 06-May-1941 DOA: 05/26/2020 PCP: [name], MD   Chief complaint.  Syncope. Brief Narrative:  Andres Gandy Sr. is a 79 y.o. 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  He was carrying 10 of 80 pounds bags to hit the truck that day.  After he finished the work, he found himself on the floor.  He did not remember falling.  In the emergency room, he has acute kidney injury, CT head showed subdural hematoma and a left scalp soft tissue injury. Patient had a similar episode 3 years ago, at that time, ICD interrogation showed ventricular arrhythmia, shock was fired. Unfortunately, we have not been able to perform ICD interrogation due to network problems, nurse is calling [employer] and try to perform interrogation.   Assessment & Plan:   Principal Problem:   Syncope Active Problems:   Chronic systolic congestive heart failure (HCC)   Coronary artery disease   Automatic implantable cardioverter-defibrillator in situ   Atrial fibrillation, persistent-long-term   AKI (acute kidney injury) ([place])   Subdural hematoma (HCC)   Insulin dependent type 2 diabetes mellitus ([place])   Hypothyroidism   Contact dermatitis  #1.  Syncope.  Most likely secondary to ventricular arrhythmia. Patient doing better today.  Currently no symptoms.  Continue telemetry monitoring.  Will attempt to perform ICD interrogation today.  Also will consult cardiology.  2.  Acute kidney injury. Secondary to syncope.  Received IV fluids.  Renal function improving, recheck BMP tomorrow.  3.  Subdural hematoma. Patient has been evaluated by neurosurgery.  No need for surgery.  Currently patient does not have any symptoms.  I will recheck his head CT tomorrow to make sure it does not get worse.  4.  Permanent atrial fibrillation. Hold Xarelto for  2 weeks for subdural hematoma.  5.  Chronic systolic congestive heart failure. Continue home medicines.  6.  Type 2 diabetes with hyperglycemia. Continue reduced dose of insulin for now.  Adjust dose as needed.  7.  Hypothyroidism. Continue current treatment.    DVT prophylaxis: SCDs Code Status: Full Family Communication: None at bedside Disposition Plan:  . Patient came from: Home           . Anticipated d/c place: Home . Barriers to d/c OR conditions which need to be met to effect a safe d/c: We will discharge home tomorrow if subdural hematoma is not getting worse.  Consultants:   Cardiology  Procedures: None Antimicrobials: None  Subjective: Patient feels well today.  No palpitation or short of breath.  No nausea vomiting.  No headache or dizziness.  No confusion.  Objective: Vitals:   05/27/20 0800 05/27/20 0932 05/27/20 0940 05/27/20 1225  BP: 116/73 124/83  (!) 116/56  Pulse: 80 80    Resp: 15 (!) 21  18  Temp:      TempSrc:      SpO2: 97% 97%  95%  Weight:   100.2 kg   Height:        Intake/Output Summary (Last 24 hours) at 05/27/2020 1233 Last data filed at 05/27/2020 0728 Gross per 24 hour  Intake --  Output 350 ml  Net -350 ml   Filed Weights   05/26/20 1132 05/27/20 0940  Weight: 99.9 kg 100.2 kg    Examination:  General exam: Appears calm and comfortable  Respiratory system:  Clear to auscultation. Respiratory effort normal. Cardiovascular system: S1 & S2 heard, RRR. No JVD, murmurs, rubs, gallops or clicks. No pedal edema. Gastrointestinal system: Abdomen is nondistended, soft and nontender. No organomegaly or masses felt. Normal bowel sounds heard. Central nervous system: Alert and oriented. No focal neurological deficits. Extremities: Symmetric 5 x 5 power. Skin: No rashes, lesions or ulcers Psychiatry: Judgement and insight appear normal. Mood & affect appropriate.     Data Reviewed: I have personally reviewed following labs and  imaging studies  CBC: Recent Labs  Lab 05/26/20 1136 05/27/20 0544  WBC 8.2 10.5  NEUTROABS 5.0  --   HGB 14.2 14.3  HCT 40.2 41.1  MCV 95.7 98.3  PLT 206 371   Basic Metabolic Panel: Recent Labs  Lab 05/26/20 1136 05/27/20 0544  NA 137 135  K 4.3 4.3  CL 105 103  CO2 22 24  GLUCOSE 179* 220*  BUN 29* 25*  CREATININE 1.53* 1.27*  CALCIUM 9.0 9.1  MG 1.7  --    GFR: Estimated Creatinine Clearance: 56 mL/min (A) (by C-G formula based on SCr of 1.27 mg/dL (H)). Liver Function Tests: No results for input(s): AST, ALT, ALKPHOS, BILITOT, PROT, ALBUMIN in the last 168 hours. No results for input(s): LIPASE, AMYLASE in the last 168 hours. No results for input(s): AMMONIA in the last 168 hours. Coagulation Profile: No results for input(s): INR, PROTIME in the last 168 hours. Cardiac Enzymes: No results for input(s): CKTOTAL, CKMB, CKMBINDEX, TROPONINI in the last 168 hours. BNP (last 3 results) No results for input(s): PROBNP in the last 8760 hours. HbA1C: No results for input(s): HGBA1C in the last 72 hours. CBG: Recent Labs  Lab 05/27/20 0241 05/27/20 0553 05/27/20 0734 05/27/20 0918 05/27/20 1012  GLUCAP 200* 195* 199* 179* 304*   Lipid Profile: No results for input(s): CHOL, HDL, LDLCALC, TRIG, CHOLHDL, LDLDIRECT in the last 72 hours. Thyroid Function Tests: Recent Labs    05/27/20 0544  TSH 1.691   Anemia Panel: Recent Labs    05/27/20 0544  VITAMINB12 1,005*   Sepsis Labs: No results for input(s): PROCALCITON, LATICACIDVEN in the last 168 hours.  Recent Results (from the past 240 hour(s))  SARS Coronavirus 2 by RT PCR (hospital order, performed in [facility] hospital lab) Nasopharyngeal Nasopharyngeal Swab     Status: None   Collection Time: 05/26/20  1:51 PM   Specimen: Nasopharyngeal Swab  Result Value Ref Range Status   SARS Coronavirus 2 NEGATIVE NEGATIVE Final    Comment: (NOTE) SARS-CoV-2 target nucleic acids are NOT DETECTED.  The  SARS-CoV-2 RNA is generally detectable in upper and lower respiratory specimens during the acute phase of infection. The lowest concentration of SARS-CoV-2 viral copies this assay can detect is 250 copies / mL. A negative result does not preclude SARS-CoV-2 infection and should not be used as the sole basis for treatment or other patient management decisions.  A negative result may occur with improper specimen collection / handling, submission of specimen other than nasopharyngeal swab, presence of viral mutation(s) within the areas targeted by this assay, and inadequate number of viral copies (<250 copies / mL). A negative result must be combined with clinical observations, patient history, and epidemiological information.  Fact Sheet for Patients:   StrictlyIdeas.no  Fact Sheet for Healthcare Providers: BankingDealers.co.za  This test is not yet approved or  cleared by the [place] FDA and has been authorized for detection and/or diagnosis of SARS-CoV-2 by FDA under an Emergency Use  Authorization (EUA).  This EUA will remain in effect (meaning this test can be used) for the duration of the COVID-19 declaration under Section 564(b)(1) of the Act, 21 U.S.C. section 360bbb-3(b)(1), unless the authorization is terminated or revoked sooner.  Performed at [facility], [address], [place], [place] [address]          Radiology Studies: CT Head Wo Contrast  Result Date: 05/26/2020 CLINICAL DATA:  Head injury.  Fall.  Follow-up subdural hematoma EXAM: CT HEAD WITHOUT CONTRAST TECHNIQUE: Contiguous axial images were obtained from the base of the skull through the vertex without intravenous contrast. COMPARISON:  CT head earlier today FINDINGS: Brain: Small interhemispheric subdural hematoma posteriorly appears unchanged. No new area of hemorrhage. Generalized atrophy.  No acute infarct or mass. Vascular: Negative for  hyperdense vessel. Skull: Negative for skull fracture. Left parietal scalp hematoma and laceration with staples. Sinuses/Orbits: Paranasal sinuses clear.  Negative orbit Other: None IMPRESSION: Small posterior interhemispheric subdural hematoma is stable. No new area of hemorrhage. Electronically Signed   By: Franchot Gallo M.D.   On: 05/26/2020 18:53   CT Head Wo Contrast  Addendum Date: 05/26/2020   ADDENDUM REPORT: 05/26/2020 12:49 ADDENDUM: Study discussed by telephone with Dr. Blake Divine on 05/26/2020 at 1225 hours. Electronically Signed   By: Genevie Ann M.D.   On: 05/26/2020 12:49   Result Date: 05/26/2020 CLINICAL DATA:  79 year old male status post syncope and fall in parking lot. Head laceration. EXAM: CT HEAD WITHOUT CONTRAST TECHNIQUE: Contiguous axial images were obtained from the base of the skull through the vertex without intravenous contrast. COMPARISON:  None. FINDINGS: Brain: Trace para falcine subdural hematoma suspected on series 3, images 19 and 21. No other intracranial hemorrhage or extra-axial fluid collection identified. No intracranial mass effect. No ventriculomegaly or intraventricular hemorrhage. Gray-white matter differentiation is within normal limits for age. No cortically based acute infarct identified. No encephalomalacia identified. No evidence of intracranial mass lesion. Vascular: Calcified atherosclerosis at the skull base. No suspicious intracranial vascular hyperdensity. Skull: No fracture or osseous abnormality identified. Incidental occipital bone arachnoid granulations (normal variant). Sinuses/Orbits: Visualized paranasal sinuses and mastoids are clear. Other: Left posterosuperior scalp convexity soft tissue injury with hematoma and trace soft tissue gas. Underlying calvarium appears intact. Other scalp and orbits soft tissues appear negative. IMPRESSION: 1. Trace amount of parafalcine subdural hematoma. No intracranial mass effect and no other acute traumatic injury  to the brain identified. 2. Left posterosuperior scalp soft tissue injury without underlying skull fracture. Electronically Signed: By: Genevie Ann M.D. On: 05/26/2020 12:19   CT Cervical Spine Wo Contrast  Result Date: 05/26/2020 CLINICAL DATA:  79 year old male status post syncope and fall in parking lot. Head laceration. EXAM: CT CERVICAL SPINE WITHOUT CONTRAST TECHNIQUE: Multidetector CT imaging of the cervical spine was performed without intravenous contrast. Multiplanar CT image reconstructions were also generated. COMPARISON:  Head CT today reported separately. FINDINGS: Alignment: Straightening of cervical lordosis with subtle anterolisthesis at both C2-C3 and C4-C5, which appears chronic and is associated with facet arthropathy at both levels. Cervicothoracic junction alignment is within normal limits. Bilateral posterior element alignment is within normal limits. Skull base and vertebrae: Visualized skull base is intact. No atlanto-occipital dissociation. No acute osseous abnormality identified. Soft tissues and spinal canal: No prevertebral fluid or swelling. No visible canal hematoma. Negative noncontrast neck soft tissues. Disc levels: Bulky degenerative changes at the craniocervical junction including ligamentous hypertrophy and dystrophic calcification / ossification about the odontoid. Small chronic appearing  endplate ossific fragments also superiorly at C4. Widespread disc bulging and endplate spurring. With up to mild degenerative cervical spinal stenosis. Upper chest: Visible upper thoracic levels appear intact. Partially visible left subclavian pacemaker type leads. Mild emphysema in the lung apices. Other: Head CT today reported separately. IMPRESSION: Widespread advanced cervical spine degeneration but no acute traumatic injury identified. Electronically Signed   By: Genevie Ann M.D.   On: 05/26/2020 12:24        Scheduled Meds: . carvedilol  6.25 mg Oral BID  . folic acid  1 mg Oral Daily   . furosemide  20 mg Oral Daily  . insulin aspart  0-20 Units Subcutaneous TID WC  . insulin aspart  0-5 Units Subcutaneous QHS  . insulin aspart  15 Units Subcutaneous TID WC  . insulin glargine  60 Units Subcutaneous QHS  . levothyroxine  50 mcg Oral QAC breakfast  . rosuvastatin  5 mg Oral Daily  . sodium chloride flush  3 mL Intravenous Q12H  . spironolactone  25 mg Oral Daily  . triamcinolone cream   Topical BID   Continuous Infusions:   LOS: 0 days    Time spent: 28 minutes    Sharen Hones, MD [facility]   To contact the attending provider between 7A-7P or the covering provider during after hours 7P-7A, please log into the web site [email or web address] and access using universal [place] password for that web site. If you do not have the password, please call the hospital operator.  05/27/2020, 12:33 PM

## 2020-05-27 NOTE — ED Notes (Addendum)
Cpap removed by patient who states he was able to rest a little and is going to stay awake now. Pt given remote and call bell in reach. Lights dimmed again per pt request. No neuro changes noted. NAD at this time.   Cream applied to pts shins after pt reporting his poison ivy was itching.

## 2020-05-27 NOTE — Progress Notes (Signed)
*  PRELIMINARY RESULTS* Echocardiogram 2D Echocardiogram has been performed.  Andres [name] 05/27/2020, 3:03 PM

## 2020-05-27 NOTE — Consult Note (Signed)
Cardiology Consultation:   Patient ID: [name]; [id number]; [birth year]   Admit date: 05/26/2020 Date of Consult: 05/27/2020  Primary Care Provider: [name], MD Primary Cardiologist: Fletcher Anon Primary Electrophysiologist:  Caryl Comes   Patient Profile:   Andres Gandy Sr. is a 79 y.o. 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 Roosevelt Locks.  History of Present Illness:   Andres [name] underwent EP study in 02/2012 which showed inducible VT. Subsequently he underwent implantation of SJM single lead ICD.  LHC at that time showed a patent SVG to OM3. Most recent LHC in 10/2015 showed an occluded SVG to OM3. The mid LCx had diffuse 90% stenosis into a small OM3 that was also diffusely disease and relatively small in size. His EF was 15% at that time. Medical therapy was advised. Echo from 06/2019 showed an EF of 30-35%, moderately dilated LV cavity, diffuse HK, and mild pulmonary hypertension with an RVSP of 35.8 mmHg. He was last seen in the office in 01/2020 noting positional dizziness and when laying in the bed after opening his eyes with symptoms concerning for vertigo. Patient's device interrogation from 05/24/2020 showed 25 episodes of NSVT (14 episodes from upload in 01/2020).   He was in his usual state of health on 6/30 and has been working on a project outdoors.  He was at [facility] and had picked up and transferred 10, eighty pound bags of concrete mix.  With this he was quite winded.  He paid for his concrete and while  walking in the parking lot of [place] he suffered a sudden syncopal episode without any preceding symptoms.  He fell backwards striking his head on the concrete.  He does not recall any of this in the next thing he remembers he wakes up in the ambulance and is asking about his concrete mix.  He indicates this episode was very similar to his episode in 03/2017 in which he had VT with appropriate therapy delivered from his ICD.  At that time, he was using a push mower in excessive heat and was quite winded as well.  Upon his arrival BP was noted to be stable in the 1-teens to 151V mmHg systolic. O2 saturations in the mid to upper 90s on room air.  He was noted to be in rate controlled A. fib.  CT head with trace amount of parafalcine subdural hematoma without intracranial mass effect. No acute findings involving the cervical spine. Repeat CT head on 6/30 showed a stable posterior interhemispheric subdural hematoma with no new area of hemorrhage. EKG showed Afib, 77 bpm, nonspecific IVCD, baseline wondering, no acute st/t changes. Orthostatic vital signs were not obtained. HS-Tn 13 with a delta of 24. Potassium 4.3-->4.3, BUN/SCr 29/1.53-->25/1.27, magnesium 1.7, WBC 8.2-->10.5, HGB 14.2-->14.3, covid negative, TSH normal. His device was unable to be interrogated in the ED as the machine in-house could not connect to the network and SJM did not have a rep at that time that could come out to interrogate the device.  He received 8 staples to repair the scalp laceration from his fall.  Since his arrival to the progressive care unit he has been feeling well outside of a mild headache.  He continues to deny any chest pain, dyspnea, palpitations, or dizziness.  No device interrogation available for review.  Review of telemetry shows A. fib with intermittent V pacing and rare PVCs.   Past Medical History:  Diagnosis Date  . Arthritis   . Atrial fibrillation, persistent-long-term   . Chronic systolic  congestive heart failure ([place])    a. 03/2015 Echo: EF 30-35%, diff HK, mild MR, mod dil LA, mildly dil RA, mild TR.  [name] Coronary artery disease    a. 1994 s/p CABG x 1 (VG->OM3);  b. 10/2015 Cath: LM nl, LAD min irregs, D1 nl, D2 min irregs, LCX 21m, OM2 nl, OM3 90 (small territory), VG->OM3 100, RCA min irregs, RPDA/RPL1/RPL2/RPL3 nl, EF 25%-->Med Rx.  [name] Hx of squamous cell carcinoma of skin 06/04/2013   L upper arm  . Hyperlipidemia   . ICD (implantable cardiac defibrillator) in place    a. 02/2012 s/p SJM [id number] Fortify Assurance single lead AICD.  [name] Mixed Ischemic and Non-ischemic Cardiomyopathy    a. 07/2012 s/p SJM AICD (RV lead only);  b. 03/2015 Echo: EF 30-35%, diff HK;  c. EF 25% by LV gram.  . Nephrolithiasis 1970's  . Polymyalgia rheumatica (HCC)    On steroids  . Sleep apnea    On CPAP  . Type II diabetes mellitus ([place])   . Ventricular tachycardia -inducible at EP testing    a. 07/2012 s/ SJM AICD.    Past Surgical History:  Procedure Laterality Date  . basel cell removal     left arm  . CARDIAC CATHETERIZATION  02/28/2012   Minor disease in LAD and RCA, LCX: 70% mid and occluded distally. Patent SVG to OM3  . CARDIAC CATHETERIZATION N/A 11/08/2015   Procedure: Left Heart Cath and Cors/Grafts Angiography;  Surgeon: [name], MD;  Location: [facility];  Service: Cardiovascular;  Laterality: N/A;  . CARDIOVERSION  03/01/2012   Procedure: CARDIOVERSION;  Surgeon: Deboraha Sprang, MD;  Location: [facility];  Service: Cardiovascular;  Laterality: N/A;  . CATARACT EXTRACTION W/ INTRAOCULAR LENS  IMPLANT, BILATERAL  2011  . CORONARY ARTERY BYPASS GRAFT  1994   [place] ; CABG X1  . ELECTROPHYSIOLOGY STUDY N/A 02/28/2012   Procedure: ELECTROPHYSIOLOGY STUDY;  Surgeon: Deboraha Sprang, MD;  Location: [facility];  Service: Cardiovascular;  Laterality: N/A;  . ICD GENERATOR CHANGEOUT N/A 03/18/2018   Procedure: [place];  Surgeon: Evans Lance, MD;  Location: [facility];  Service: Cardiovascular;  Laterality: N/A;  . INSERT / REPLACE / REMOVE PACEMAKER  02/28/12   "w/defibrillator"  . JOINT REPLACEMENT    . KNEE ARTHROSCOPY  2012   right  . LEFT HEART CATHETERIZATION WITH CORONARY/GRAFT ANGIOGRAM N/A 02/28/2012   Procedure: LEFT HEART CATHETERIZATION WITH [name];  Surgeon: [name], MD;  Location: [facility] CATH LAB;  Service: Cardiovascular;  Laterality: N/A;  . TOTAL KNEE ARTHROPLASTY  2011   left     Home Meds: Prior to Admission medications   Medication Sig Start Date End Date Taking? Authorizing Provider  carvedilol (COREG) 6.25 MG tablet TAKE ONE TABLET TWICE DAILY 11/29/17  Yes Arida, Mertie Clause, MD  Cinnamon 500 MG TABS Take 1,000 mg by mouth 2 (two) times daily.    Yes [provider]  Coenzyme Q10 (CO Q 10 PO) Take 300 mg by mouth daily.   Yes [provider]  diphenhydramine-acetaminophen (TYLENOL PM) 25-500 MG TABS tablet Take 2 tablets by mouth at bedtime as needed.   Yes [provider]  enalapril (VASOTEC) 2.5 MG tablet Take 2.5 mg by mouth daily. 03/07/16  Yes [provider]  eplerenone (INSPRA) 25 MG tablet TAKE ONE-HALF TABLET BY MOUTH EVERY DAY 10/01/19  Yes Deboraha Sprang, MD  fluticasone [facility]) 50 MCG/ACT nasal spray Place into the nose daily. 06/06/18  Yes [provider]  folic acid (FOLVITE) 1 MG tablet Take 1 mg by mouth daily.    Yes [provider]  furosemide (LASIX) 20 MG tablet TAKE ONE TABLET EVERY DAY 03/17/20  Yes [name], MD  Garlic 9702 MG CAPS Take 1 capsule by mouth daily.    Yes [provider]  Glucosamine-Chondroit-Vit C-Mn (GLUCOSAMINE CHONDR 1500 COMPLX PO) Take 1,500 mg by mouth 2 (two) times daily.    Yes [provider]  insulin NPH Human (HUMULIN N,NOVOLIN N) 100 UNIT/ML injection Inject 95 Units into the skin at bedtime.    Yes [provider]  insulin regular (NOVOLIN R,HUMULIN R) 100  units/mL injection Inject 30 Units into the skin 3 (three) times daily before meals.    Yes [provider]  ipratropium (ATROVENT) 0.03 % nasal spray SMARTSIG:2 Spray(s) Both Nares 3 Times Daily PRN 03/10/20  Yes [provider]  levothyroxine (SYNTHROID, LEVOTHROID) 50 MCG tablet Take 50 mcg by mouth daily before breakfast.  04/05/17  Yes [provider]  metFORMIN (GLUCOPHAGE) 500 MG tablet Take 500 mg tablet in the am with 0.5 tablet in the pm.   Yes [provider]  methotrexate (RHEUMATREX) 2.5 MG tablet as directed. Take 5 (12.5 mg) tablets by mouth every 7 days. Caution:Chemotherapy. Protect from light.   Yes [provider]  Omega-3 Fatty Acids (FISH OIL PO) Take by mouth 2 (two) times daily.    Yes [provider]  rosuvastatin (CRESTOR) 5 MG tablet Take 2 tablets (10mg ) daily alternating 1 tablet (5 mg) daily 02/19/20  Yes [name], MD  XARELTO 20 MG TABS tablet TAKE ONE TABLET EVERY DAY 04/01/18  Yes [name], MD    Inpatient Medications: Scheduled Meds: . carvedilol  6.25 mg Oral BID  . folic acid  1 mg Oral Daily  . furosemide  20 mg Oral Daily  . insulin aspart  0-20 Units Subcutaneous TID WC  . insulin aspart  0-5 Units Subcutaneous QHS  . insulin aspart  15 Units Subcutaneous TID WC  . insulin glargine  60 Units Subcutaneous QHS  . levothyroxine  50 mcg Oral QAC breakfast  . rosuvastatin  5 mg Oral Daily  . sodium chloride flush  3 mL Intravenous Q12H  . spironolactone  25 mg Oral Daily  . triamcinolone cream   Topical BID   Continuous Infusions: . magnesium sulfate bolus IVPB     PRN Meds: acetaminophen  Allergies:  No Known Allergies  Social History:   Social History   Socioeconomic History  . Marital status: Married    Spouse name: Not on file  . Number of children: 3  . Years of education: Not on file  . Highest education level: Not on file  Occupational History  . Not on file  Tobacco  Use  . Smoking status: Former Smoker    Packs/day: 1.00    Years: 25.00  Pack years: 25.00    Types: Cigarettes    Quit date: 01/09/1988    Years since quitting: 32.4  . Smokeless tobacco: Never Used  Substance and Sexual Activity  . Alcohol use: No  . Drug use: No  . Sexual activity: Never  Other Topics Concern  . Not on file  Social History Narrative  . Not on file   Social Determinants of Health   Financial Resource Strain:   . Difficulty of Paying Living Expenses:   Food Insecurity:   . Worried About Charity fundraiser in the Last Year:   . Arboriculturist in the Last Year:   Transportation Needs:   . Film/video editor (Medical):   [name] Lack of Transportation (Non-Medical):   Physical Activity:   . Days of Exercise per Week:   . Minutes of Exercise per Session:   Stress:   . Feeling of Stress :   Social Connections:   . Frequency of Communication with Friends and Family:   . Frequency of Social Gatherings with Friends and Family:   . Attends Religious Services:   . Active Member of Clubs or Organizations:   . Attends Archivist Meetings:   [name] Marital Status:   Intimate Partner Violence:   . Fear of Current or Ex-Partner:   . Emotionally Abused:   [name] Physically Abused:   . Sexually Abused:      Family History:   Family History  Problem Relation Age of Onset  . Heart attack Father   . Heart attack Brother     ROS:  Review of Systems  Constitutional: Negative for chills, diaphoresis, fever, malaise/fatigue and weight loss.  HENT: Negative for congestion.   Eyes: Negative for discharge and redness.  Respiratory: Negative for cough, hemoptysis, sputum production, shortness of breath and wheezing.   Cardiovascular: Negative for chest pain, palpitations, orthopnea, claudication, leg swelling and PND.  Gastrointestinal: Negative for abdominal pain, blood in stool, heartburn, melena, nausea and vomiting.  Genitourinary: Negative for hematuria.   Musculoskeletal: Positive for falls. Negative for myalgias.  Skin: Negative for rash.  Neurological: Positive for loss of consciousness and headaches. Negative for dizziness, tingling, tremors, sensory change, speech change, focal weakness, seizures and weakness.  Endo/Heme/Allergies: Does not bruise/bleed easily.  Psychiatric/Behavioral: Negative for substance abuse. The patient is not nervous/anxious.   All other systems reviewed and are negative.     Physical Exam/Data:   Vitals:   05/27/20 0940 05/27/20 1225 05/27/20 1316 05/27/20 1415  BP:  (!) 116/56 (!) 129/97 136/81  Pulse:   78 88  Resp:  18 16 20   Temp:    98.4 F (36.9 C)  TempSrc:    Oral  SpO2:  95% 98% 99%  Weight: 100.2 kg   94.7 kg  Height:        Intake/Output Summary (Last 24 hours) at 05/27/2020 1810 Last data filed at 05/27/2020 1621 Gross per 24 hour  Intake --  Output 2350 ml  Net -2350 ml   Filed Weights   05/26/20 1132 05/27/20 0940 05/27/20 1415  Weight: 99.9 kg 100.2 kg 94.7 kg   Body mass index is 29.95 kg/m.   Physical Exam: General: Well developed, well nourished, in no acute distress. Head: Normocephalic, atraumatic, sclera non-icteric, no xanthomas, nares without discharge.  Neck: Negative for carotid bruits. JVD not elevated. Lungs: Clear bilaterally to auscultation without wheezes, rales, or rhonchi. Breathing is unlabored. Heart: Irregularly irregular with S1 S2. No murmurs, rubs, or  gallops appreciated. Abdomen: Soft, non-tender, non-distended with normoactive bowel sounds. No hepatomegaly. No rebound/guarding. No obvious abdominal masses. Msk:  Strength and tone appear normal for age. Extremities: No clubbing or cyanosis. No edema. Distal pedal pulses are 2+ and equal bilaterally. Neuro: Alert and oriented X 3. No facial asymmetry. No focal deficit. Moves all extremities spontaneously. Psych:  Responds to questions appropriately with a normal affect.   EKG:  The EKG was personally  reviewed and demonstrates: Afib, 77 bpm, nonspecific IVCD, baseline wondering, no acute st/t changes Telemetry:  Telemetry was personally reviewed and demonstrates: A. fib with intermittent V pacing and rare PVCs  Weights: Filed Weights   05/26/20 1132 05/27/20 0940 05/27/20 1415  Weight: 99.9 kg 100.2 kg 94.7 kg    Relevant CV Studies:  2D echo 05/27/2020: Pending __________  Remote device interrogation 05/24/2020: 25-Non-sustained, irregular V-V, morphology similar to presenting. Has hx of PAF, takes Xarelto.  Overall ventricular rates well-controlled. __________  2D echo 06/2019: 1. The left ventricle has moderate-severely reduced systolic function,  with an ejection fraction of 30-35%. The cavity size was moderately  dilated. Left ventricular diastolic Doppler parameters are indeterminate.  Left ventricular diffuse hypokinesis.  2. The right ventricle has low normal systolic function. The cavity was  mildly enlarged. There is no increase in right ventricular wall thickness.  Right ventricular systolic pressure is mildly elevated with an estimated  pressure of 35.8 mmHg.  3. Left atrial size was moderately dilated  4. Mild dilation of the ascending aorta 3.5 cm  __________  LHC 10/2015:  Mid Cx lesion, 90% stenosed.  Ost 3rd Mrg to 3rd Mrg lesion, 90% stenosed.  SVG was injected .  Origin lesion, 100% stenosed.  There is severe left ventricular systolic dysfunction.   1. Significant one-vessel coronary artery disease involving distal left circumflex into OM 3. No obstructive disease involving the LAD/RCA. Occluded SVG to OM 3 which is new since most recent cardiac catheterization in 2013. 2. Severely reduced LV systolic function with an ejection fraction of 15% with global hypokinesis. 3. Mildly elevated left ventricular end-diastolic pressure.  Recommendations: Continue aggressive medical therapy for coronary artery disease and heart failure. Cardiomyopathy is out  of proportion to coronary artery disease.  I doubt there would be much benefit from revascularization of the distal left circumflex and OM 3 given that it supplies a relatively small territory and the area is diffusely diseased.   Laboratory Data:  Chemistry Recent Labs  Lab 05/26/20 1136 05/27/20 0544  NA 137 135  K 4.3 4.3  CL 105 103  CO2 22 24  GLUCOSE 179* 220*  BUN 29* 25*  CREATININE 1.53* 1.27*  CALCIUM 9.0 9.1  GFRNONAA 43* 53*  GFRAA 49* >60  ANIONGAP 10 8    No results for input(s): PROT, ALBUMIN, AST, ALT, ALKPHOS, BILITOT in the last 168 hours. Hematology Recent Labs  Lab 05/26/20 1136 05/27/20 0544  WBC 8.2 10.5  RBC 4.20* 4.18*  HGB 14.2 14.3  HCT 40.2 41.1  MCV 95.7 98.3  MCH 33.8 34.2*  MCHC 35.3 34.8  RDW 16.2* 15.9*  PLT 206 191   Cardiac EnzymesNo results for input(s): TROPONINI in the last 168 hours. No results for input(s): TROPIPOC in the last 168 hours.  BNPNo results for input(s): BNP, PROBNP in the last 168 hours.  DDimer No results for input(s): DDIMER in the last 168 hours.  Radiology/Studies:  CT Head Wo Contrast  Result Date: 05/26/2020 IMPRESSION: Small posterior interhemispheric subdural hematoma is  stable. No new area of hemorrhage. Electronically Signed   By: Franchot Gallo M.D.   On: 05/26/2020 18:53   CT Head Wo Contrast  Result Date: 05/26/2020 IMPRESSION: 1. Trace amount of parafalcine subdural hematoma. No intracranial mass effect and no other acute traumatic injury to the brain identified. 2. Left posterosuperior scalp soft tissue injury without underlying skull fracture. Electronically Signed: By: Genevie Ann M.D. On: 05/26/2020 12:19   CT Cervical Spine Wo Contrast  Result Date: 05/26/2020 IMPRESSION: Widespread advanced cervical spine degeneration but no acute traumatic injury identified. Electronically Signed   By: Genevie Ann M.D.   On: 05/26/2020 12:24   CUP PACEART REMOTE DEVICE CHECK  Result Date: 05/24/2020 Scheduled  remote reviewed. Normal device function.  25-Non-sustained, irregular V-V, morphology similar to presenting. Has hx of PAF, takes Xarelto. Overall ventricular rates well-controlled. Next remote 91 days.  [facility]   Assessment and Plan:   1. Syncope: -Sudden onset following significant exertion and concerning for arrhythmia -Presented to the ED via EMS at approximately 11:30 on 05/26/2020 with cardiology consult placed in the afternoon of 7/1 -No device interrogation available for review at time of consult  -No sustained malignant arrhythmias on telemetry -I have called and spoken with [place] as well as the covering rep, [place].  She will come out and interrogate the patient's device on 7/2 and call the results to our covering team -Based on interrogation findings may need to consider addition of antiarrhythmic therapy -Further recommendations pending device interrogation  2. CAD involving the native coronary arteries and bypass graft with elevated troponin: -No symptoms of chest pain or dyspnea -Initial HS-Tn 13 with a delta of 24, not cycled, given the above, recommend trending to peak -Most recent cath from 10/2015 showed one-vessel CAD as outlined above -Pending troponin trend and device interrogation results, further recommendations regarding ischemic evaluation pending  3. 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 History of VT: -Status post ICD with generator change out in 02/2018 -Remote interrogation 05/24/2020 with 25, nonsustained irregular V-V morphology similar to prior -Interrogate device as above -Carvedilol as above  -Monitor on telemetry  -Subclinical hypomagnesemia with recommendation to replete to goal  2.0 -Potassium at goal -TSH normal  5. Acute on CKD stage II: -Cannot exclude some degree of ATN with possible hypotension/dehydration  -Improving  -Enalapril and eplerenone held  6. Permanent Afib: -Ventricular rates well controlled -Carvedilol as above -CHADS2VASc 5 (CHF, HTN, age x 2, vascular disease) -Xarelto on hold given subdural hematoma per neurosurgery  7. Subdural hematoma: -Stable on repeat imaging -Neurosurgery has recommended [place] be held for 2 weeks -Repeat head CT planned for 7/2 -Follow-up with neurosurgery as directed   For questions or updates, please contact [place] Please consult [email or web address] for contact info under Cardiology/STEMI.   Signed, Christell Faith, PA-C [place] Pager: [phone] 05/27/2020, 6:10 PM

## 2020-05-27 NOTE — Plan of Care (Signed)
  Problem: Clinical Measurements: Goal: Ability to maintain clinical measurements within normal limits will improve Outcome: Progressing Goal: Respiratory complications will improve Outcome: Progressing   Problem: Safety: Goal: Ability to remain free from injury will improve Outcome: Progressing   Problem: Skin Integrity: Goal: Risk for impaired skin integrity will decrease Outcome: Progressing   

## 2020-05-27 NOTE — ED Notes (Signed)
This tech redressed IV.

## 2020-05-27 NOTE — ED Notes (Signed)
Pt given phone to call family.

## 2020-05-28 ENCOUNTER — Inpatient Hospital Stay: Payer: Medicare Other

## 2020-05-28 DIAGNOSIS — I25708 Atherosclerosis of coronary artery bypass graft(s), unspecified, with other forms of angina pectoris: Secondary | ICD-10-CM

## 2020-05-28 DIAGNOSIS — I5022 Chronic systolic (congestive) heart failure: Secondary | ICD-10-CM

## 2020-05-28 DIAGNOSIS — I472 Ventricular tachycardia, unspecified: Secondary | ICD-10-CM

## 2020-05-28 LAB — CBC WITH DIFFERENTIAL/PLATELET
Abs Immature Granulocytes: 0.03 10*3/uL (ref 0.00–0.07)
Basophils Absolute: 0.1 10*3/uL (ref 0.0–0.1)
Basophils Relative: 1 %
Eosinophils Absolute: 0.2 10*3/uL (ref 0.0–0.5)
Eosinophils Relative: 2 %
HCT: 45.5 % (ref 39.0–52.0)
Hemoglobin: 16 g/dL (ref 13.0–17.0)
Immature Granulocytes: 0 %
Lymphocytes Relative: 24 %
Lymphs Abs: 2.2 10*3/uL (ref 0.7–4.0)
MCH: 34.1 pg — ABNORMAL HIGH (ref 26.0–34.0)
MCHC: 35.2 g/dL (ref 30.0–36.0)
MCV: 97 fL (ref 80.0–100.0)
Monocytes Absolute: 0.7 10*3/uL (ref 0.1–1.0)
Monocytes Relative: 8 %
Neutro Abs: 5.8 10*3/uL (ref 1.7–7.7)
Neutrophils Relative %: 65 %
Platelets: 208 10*3/uL (ref 150–400)
RBC: 4.69 MIL/uL (ref 4.22–5.81)
RDW: 16 % — ABNORMAL HIGH (ref 11.5–15.5)
WBC: 9 10*3/uL (ref 4.0–10.5)
nRBC: 0 % (ref 0.0–0.2)

## 2020-05-28 LAB — GLUCOSE, CAPILLARY
Glucose-Capillary: 223 mg/dL — ABNORMAL HIGH (ref 70–99)
Glucose-Capillary: 265 mg/dL — ABNORMAL HIGH (ref 70–99)
Glucose-Capillary: 240 mg/dL — ABNORMAL HIGH (ref 70–99)
Glucose-Capillary: 216 mg/dL — ABNORMAL HIGH (ref 70–99)

## 2020-05-28 LAB — BASIC METABOLIC PANEL
Anion gap: 10 (ref 5–15)
BUN: 25 mg/dL — ABNORMAL HIGH (ref 8–23)
CO2: 27 mmol/L (ref 22–32)
Calcium: 9.2 mg/dL (ref 8.9–10.3)
Chloride: 99 mmol/L (ref 98–111)
Creatinine, Ser: 1.36 mg/dL — ABNORMAL HIGH (ref 0.61–1.24)
GFR calc Af Amer: 57 mL/min — ABNORMAL LOW (ref >60)
GFR calc non Af Amer: 49 mL/min — ABNORMAL LOW (ref >60)
Glucose, Bld: 183 mg/dL — ABNORMAL HIGH (ref 70–99)
Potassium: 4.5 mmol/L (ref 3.5–5.1)
Sodium: 136 mmol/L (ref 135–145)

## 2020-05-28 LAB — HEMOGLOBIN A1C
Hgb A1c MFr Bld: 6.8 % — ABNORMAL HIGH (ref 4.8–5.6)
Mean Plasma Glucose: 148 mg/dL

## 2020-05-28 LAB — MAGNESIUM: Magnesium: 2 mg/dL (ref 1.7–2.4)

## 2020-05-28 MED ORDER — METOPROLOL SUCCINATE ER 25 MG PO TB24
25.0000 mg | ORAL_TABLET | Freq: Every evening | ORAL | Status: DC
  Administered 2020-05-28: 25 mg via ORAL
  Filled 2020-05-28: qty 1

## 2020-05-28 MED ORDER — MECLIZINE HCL 25 MG PO TABS
25.0000 mg | ORAL_TABLET | Freq: Three times a day (TID) | ORAL | Status: DC | PRN
  Administered 2020-05-28 – 2020-05-29 (×2): 25 mg via ORAL
  Filled 2020-05-28 (×3): qty 1

## 2020-05-28 NOTE — Progress Notes (Signed)
Pt c/o dizziness with any movement/ orthostatic VS negative/ Dr. Roosevelt Locks and Dr. Rockey Situ made aware/ Dr. Roosevelt Locks to bedside to assess pt/ adjustments made to medication/ will monitor close

## 2020-05-28 NOTE — Progress Notes (Signed)
Inpatient Diabetes Program Recommendations  AACE/ADA: New Consensus Statement on Inpatient Glycemic Control (2015)  Target Ranges:  Prepandial:   less than 140 mg/dL      Peak postprandial:   less than 180 mg/dL (1-2 hours)      Critically ill patients:  140 - 180 mg/dL   Lab Results  Component Value Date   GLUCAP 223 (H) 05/28/2020   HGBA1C 6.8 (H) 05/26/2020    Review of Glycemic Control Results for Andres [name], Andres SR. (MRN [id number]) as of 05/28/2020 09:24  Ref. Range 05/27/2020 10:12 05/27/2020 13:06 05/27/2020 16:16 05/27/2020 21:22 05/28/2020 07:24  Glucose-Capillary Latest Ref Range: 70 - 99 mg/dL 304 (H) 277 (H) 179 (H) 128 (H) 223 (H)   Diabetes history: DM2 Outpatient Diabetes medications: Novolin N 95 units q hs + Novolin R 30 units tid + Metformin 500 mg am + 250 mg pm. Current orders for Inpatient glycemic control:  Lantus 60 units q hs + Novolog 15 units tid + Novolog 0-20 units tid + 0-5 hs.  Inpatient Diabetes Program Recommendations:   -Fasting CBG 223 -Increase Lantus to 65 units daily  Thank you, Nani Gasser. Kyrie Bun, RN, MSN, CDE  Diabetes Coordinator Inpatient Glycemic Control Team Team Pager [phone] (8am-5pm) 05/28/2020 9:27 AM

## 2020-05-28 NOTE — Progress Notes (Signed)
QHS blood sugar 128. Patient due to receive 60 units of lantus. Randol Kern NP made aware. Per NP, proceed with administration. Will continue to monitor.

## 2020-05-28 NOTE — Progress Notes (Signed)
PROGRESS NOTE    Andres BURDELL Sr.  URK:[id number] DOB: 1941/01/11 DOA: 05/26/2020 PCP: [name], MD   Chief complaint.  Dizziness.   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  He was carrying 10 of 80 pounds bags to hit the truck that day.  After he finished the work, he found himself on the floor.  He did not remember falling.  In the emergency room, he has acute kidney injury, CT head showed subdural hematoma and a left scalp soft tissue injury. Patient had a similar episode 3 years ago, at that time, ICD interrogation showed ventricular arrhythmia, shock was fired. Unfortunately, we have not been able to perform ICD interrogation due to network problems, nurse is calling [employer] and try to perform interrogation.  7/2.  ICD interrogation showed VF at a rate of 240.  Converted to polymorphic VT after ATP, then self terminated.  Repeated CT scan this morning showed stable small subdural hematoma.  Patient has significant dizziness today, probably concussion.  We will keep 1 more day.  Start meclizine as needed.    Assessment & Plan:   Principal Problem:   VT (ventricular tachycardia) (HCC) Active Problems:   Coronary artery disease   Automatic implantable cardioverter-defibrillator in situ   Atrial fibrillation, persistent-long-term   Syncope   AKI (acute kidney injury) ([place])   Subdural hematoma (HCC)   Insulin dependent type 2 diabetes mellitus ([place])   #1.  Syncope.  Secondary to ventricular arrhythmia. Patient has converted to sinus rhythm by ATP.  No recurrence.  Patient still very dizzy today, probably from had a concussion from the fall.  Patient had a subdural hematoma, I will repeat a CT scan tomorrow to make sure is not getting worse.  CT scan today is stable.  I will also start as needed  meclizine for symptom control.  2.  Acute kidney injury chronic kidney disease stage IIIa. Review patient previous lab 6 months ago, creatinine level was 1.26 to 1.47.  He has received fluids, now at baseline.  3.  Subdural hematoma. Repeat CT scan today showed a stable hematoma.  Due to significant dizziness, I will repeat a CT scan tomorrow to make sure is not getting worse.  4.  Permanent atrial fibrillation. Hold anticoagulation.  5.  Chronic systolic congestive heart failure. Continue home medicines.  6.  Type 2 diabetes with hyperglycemia. Continue current regimen.  DVT prophylaxis: SCDs Code Status: Full Family Communication: None at bedside Disposition Plan:   Patient came from:      Home                                                                                                           Anticipated d/c place: Home  Barriers to d/c OR conditions which need to be met to effect a safe d/c: We will discharge home tomorrow if subdural hematoma is not getting worse.  Consultants:  Cardiology  Procedures: None Antimicrobials: None    Subjective: Patient complaining of significant dizziness today.  Some vertigo.  He has some baseline dizziness, this is worse than before.  He does not have a headache.  Denies any short of breath or cough.  No fever or chills.  Objective: Vitals:   05/28/20 1335 05/28/20 1340 05/28/20 1341 05/28/20 1343  BP: 99/87 126/71 140/83 114/74  Pulse: 77 (!) 54 77 76  Resp:  _0 Temp:      TempSrc:      SpO2:  98% 100% 100%  Weight:      Height:        Intake/Output Summary (Last 24 hours) at 05/28/2020 1436 Last data filed at 05/28/2020 1246 Gross per 24 hour  Intake 240 ml  Output 2500 ml  Net -2260 ml   Filed Weights   05/27/20 0940 05/27/20 1415 05/28/20 0545  Weight: 100.2 kg 94.7 kg 92.4 kg    Examination:  General exam: Appears calm and comfortable  Respiratory system: Clear to auscultation. Respiratory  effort normal. Cardiovascular system: Irregular. No JVD, murmurs, rubs, gallops or clicks. No pedal edema. Gastrointestinal system: Abdomen is nondistended, soft and nontender. No organomegaly or masses felt. Normal bowel sounds heard. Central nervous system: Alert and oriented. No focal neurological deficits. Extremities: Symmetric  Skin: No rashes, lesions or ulcers Psychiatry: Judgement and insight appear normal. Mood & affect appropriate.     Data Reviewed: I have personally reviewed following labs and imaging studies  CBC: Recent Labs  Lab 05/26/20 1136 05/27/20 0544 05/28/20 0522  WBC 8.2 10.5 9.0  NEUTROABS 5.0  --  5.8  HGB 14.2 14.3 16.0  HCT 40.2 41.1 45.5  MCV 95.7 98.3 97.0  PLT 206 191 573   Basic Metabolic Panel: Recent Labs  Lab 05/26/20 1136 05/27/20 0544 05/28/20 0522  NA 137 135 136  K 4.3 4.3 4.5  CL 105 103 99  CO2 _1 GLUCOSE 179* 220* 183*  BUN 29* 25* 25*  CREATININE 1.53* 1.27* 1.36*  CALCIUM 9.0 9.1 9.2  MG 1.7  --  2.0   GFR: Estimated Creatinine Clearance: 50.3 mL/min (A) (by C-G formula based on SCr of 1.36 mg/dL (H)). Liver Function Tests: No results for input(s): AST, ALT, ALKPHOS, BILITOT, PROT, ALBUMIN in the last 168 hours. No results for input(s): LIPASE, AMYLASE in the last 168 hours. No results for input(s): AMMONIA in the last 168 hours. Coagulation Profile: No results for input(s): INR, PROTIME in the last 168 hours. Cardiac Enzymes: No results for input(s): CKTOTAL, CKMB, CKMBINDEX, TROPONINI in the last 168 hours. BNP (last 3 results) No results for input(s): PROBNP in the last 8760 hours. HbA1C: Recent Labs    05/26/20 1136  HGBA1C 6.8*   CBG: Recent Labs  Lab 05/27/20 1306 05/27/20 1616 05/27/20 2122 05/28/20 0724 05/28/20 1138  GLUCAP 277* 179* 128* 223* 265*   Lipid Profile: No results for input(s): CHOL, HDL, LDLCALC, TRIG, CHOLHDL, LDLDIRECT in the last 72 hours. Thyroid Function Tests: Recent  Labs    05/27/20 0544  TSH 1.691   Anemia Panel: Recent Labs    05/27/20 0544  VITAMINB12 1,005*   Sepsis Labs: No results for input(s): PROCALCITON, LATICACIDVEN in the last 168 hours.  Recent Results (from the past 240 hour(s))  SARS Coronavirus 2 by RT PCR (hospital order, performed in [facility] hospital lab) Nasopharyngeal Nasopharyngeal Swab     Status: None   Collection Time: 05/26/20  1:51 PM   Specimen: Nasopharyngeal Swab  Result Value Ref Range Status   SARS Coronavirus 2 NEGATIVE NEGATIVE Final    Comment: (NOTE) SARS-CoV-2 target nucleic acids are NOT DETECTED.  The SARS-CoV-2 RNA is generally detectable in upper and lower respiratory specimens during the acute phase of infection. The lowest concentration of SARS-CoV-2 viral copies this assay can detect is 250 copies / mL. A negative result does not preclude SARS-CoV-2 infection and should not be used as the sole basis for treatment or other patient management decisions.  A negative result may occur with improper specimen collection / handling, submission of specimen other than nasopharyngeal swab, presence of viral mutation(s) within the areas targeted by this assay, and inadequate number of viral copies (<250 copies / mL). A negative result must be combined with clinical observations, patient history, and epidemiological information.  Fact Sheet for Patients:   StrictlyIdeas.no  Fact Sheet for Healthcare Providers: BankingDealers.co.za  This test is not yet approved or  cleared by the [place] FDA and has been authorized for detection and/or diagnosis of SARS-CoV-2 by FDA under an Emergency Use Authorization (EUA).  This EUA will remain in effect (meaning this test can be used) for the duration of the COVID-19 declaration under Section 564(b)(1) of the Act, 21 U.S.C. section 360bbb-3(b)(1), unless the authorization is terminated or revoked  sooner.  Performed at [facility], [address] [place], [place] [address]          Radiology Studies: CT HEAD WO CONTRAST  Result Date: 05/28/2020 CLINICAL DATA:  Subdural hematoma. EXAM: CT HEAD WITHOUT CONTRAST TECHNIQUE: Contiguous axial images were obtained from the base of the skull through the vertex without intravenous contrast. COMPARISON:  May 26, 2020. FINDINGS: Brain: Mild diffuse cortical atrophy is noted. Grossly stable small interhemispheric subdural hematoma is noted posteriorly. No mass effect or midline shift is noted. Ventricular size is within normal limits. No new hemorrhage is noted. No acute infarction or mass lesion is noted. Vascular: No hyperdense vessel or unexpected calcification. Skull: Normal. Negative for fracture or focal lesion. Sinuses/Orbits: No acute finding. Other: Grossly stable left-sided posterior scalp hematoma is noted with overlying sutures. IMPRESSION: Grossly stable small interhemispheric subdural hematoma. Grossly stable left-sided posterior scalp hematoma is noted with overlying sutures. Electronically Signed   By: Marijo Conception M.D.   On: 05/28/2020 08:13   CT Head Wo Contrast  Result Date: 05/26/2020 CLINICAL DATA:  Head injury.  Fall.  Follow-up subdural hematoma EXAM: CT HEAD WITHOUT CONTRAST TECHNIQUE: Contiguous axial images were obtained from the base of the skull through the vertex without intravenous contrast. COMPARISON:  CT head earlier today FINDINGS: Brain: Small interhemispheric subdural hematoma posteriorly appears unchanged. No new area of hemorrhage. Generalized atrophy.  No acute infarct or mass. Vascular: Negative for hyperdense vessel. Skull: Negative for skull fracture. Left parietal scalp hematoma and laceration with staples. Sinuses/Orbits: Paranasal sinuses clear.  Negative orbit Other: None IMPRESSION: Small posterior interhemispheric subdural hematoma is stable. No new area of hemorrhage. Electronically Signed    By: Franchot Gallo M.D.   On: 05/26/2020 18:53   ECHOCARDIOGRAM COMPLETE  Result Date: 05/27/2020    ECHOCARDIOGRAM REPORT   Patient Name:   Sr. IRVINE GLORIOSO Sr. Date of Exam: 05/27/2020 Medical Rec #:  [id number]                 Height:       70.0 in Accession #:    [id number]  Weight:       208.0 lb Date of Birth:  10/22/1941                 BSA:          2.122 m Patient Age:    55 years                  BP:           136/81 mmHg Patient Gender: M                         HR:           77 bpm. Exam Location:  [facility] Procedure: 2D Echo, Color Doppler, Cardiac Doppler and Intracardiac            Opacification Agent Indications:     R55 Syncope  History:         Patient has prior history of Echocardiogram examinations. CHF,                  CAD, Defibrillator; Risk Factors:Sleep Apnea and Dyslipidemia.  Sonographer:     Charmayne Sheer RDCS (AE) Referring Phys:  [id number] Angela Burke TU Diagnosing Phys: Ida Rogue MD  Sonographer Comments: Suboptimal apical window and suboptimal subcostal window. IMPRESSIONS  1. Left ventricular ejection fraction, by estimation, is 20 to 25%. The left ventricle has severely decreased function. The left ventricle demonstrates global hypokinesis. The left ventricular internal cavity size was mildly dilated. Left ventricular diastolic parameters are indeterminate.  2. Right ventricular systolic function is mildly reduced. The right ventricular size is mildly enlarged. There is mildly elevated pulmonary artery systolic pressure. The estimated right ventricular systolic pressure is 31.4 mmHg.  3. Tricuspid valve regurgitation is moderate. FINDINGS  Left Ventricle: Left ventricular ejection fraction, by estimation, is 20 to 25%. The left ventricle has severely decreased function. The left ventricle demonstrates global hypokinesis. Definity contrast agent was given IV to delineate the left ventricular endocardial borders. The left ventricular internal cavity size was mildly  dilated. There is no left ventricular hypertrophy. Left ventricular diastolic parameters are indeterminate. Right Ventricle: The right ventricular size is mildly enlarged. No increase in right ventricular wall thickness. Right ventricular systolic function is mildly reduced. There is mildly elevated pulmonary artery systolic pressure. The tricuspid regurgitant  velocity is 2.39 m/s, and with an assumed right atrial pressure of 10 mmHg, the estimated right ventricular systolic pressure is 97.0 mmHg. Left Atrium: Left atrial size was normal in size. Right Atrium: Right atrial size was normal in size. Pericardium: There is no evidence of pericardial effusion. Mitral Valve: The mitral valve is normal in structure. Normal mobility of the mitral valve leaflets. No evidence of mitral valve regurgitation. No evidence of mitral valve stenosis. MV peak gradient, 3.4 mmHg. The mean mitral valve gradient is 2.0 mmHg. Tricuspid Valve: The tricuspid valve is normal in structure. Tricuspid valve regurgitation is moderate . No evidence of tricuspid stenosis. Aortic Valve: The aortic valve was not well visualized. Aortic valve regurgitation is not visualized. Mild to moderate aortic valve sclerosis/calcification is present, without any evidence of aortic stenosis. Aortic valve mean gradient measures 2.0 mmHg.  Aortic valve peak gradient measures 4.8 mmHg. Aortic valve area, by VTI measures 2.92 cm. Pulmonic Valve: The pulmonic valve was normal in structure. Pulmonic valve regurgitation is not visualized. No evidence of pulmonic stenosis. Aorta: The aortic root is normal in size and structure. Venous: The inferior vena  cava is normal in size with greater than 50% respiratory variability, suggesting right atrial pressure of 3 mmHg. IAS/Shunts: No atrial level shunt detected by color flow Doppler. Additional Comments: A pacer wire is visualized.  LEFT VENTRICLE PLAX 2D LVIDd:         6.38 cm  Diastology LVIDs:         5.69 cm  LV e'  lateral:   10.20 cm/s LV PW:         0.87 cm  LV E/e' lateral: 7.5 LV IVS:        0.60 cm  LV e' medial:    9.46 cm/s LVOT diam:     2.20 cm  LV E/e' medial:  8.0 LV SV:         48 LV SV Index:   23 LVOT Area:     3.80 cm  LEFT ATRIUM             Index LA diam:        4.30 cm 2.03 cm/m LA Vol (A2C):   78.8 ml 37.13 ml/m LA Vol (A4C):   89.3 ml 42.07 ml/m LA Biplane Vol: 83.1 ml 39.15 ml/m  AORTIC VALVE                   PULMONIC VALVE AV Area (Vmax):    2.28 cm    PV Vmax:       0.88 m/s AV Area (Vmean):   2.37 cm    PV Vmean:      61.100 cm/s AV Area (VTI):     2.92 cm    PV VTI:        0.124 m AV Vmax:           109.00 cm/s PV Peak grad:  3.1 mmHg AV Vmean:          71.600 cm/s PV Mean grad:  2.0 mmHg AV VTI:            0.164 m AV Peak Grad:      4.8 mmHg AV Mean Grad:      2.0 mmHg LVOT Vmax:         65.30 cm/s LVOT Vmean:        44.700 cm/s LVOT VTI:          0.126 m LVOT/AV VTI ratio: 0.77  AORTA Ao Root diam: 3.40 cm MITRAL VALVE               TRICUSPID VALVE MV Area (PHT): 2.24 cm    TR Peak grad:   22.8 mmHg MV Peak grad:  3.4 mmHg    TR Vmax:        239.00 cm/s MV Mean grad:  2.0 mmHg MV Vmax:       0.92 m/s    SHUNTS MV Vmean:      59.7 cm/s   Systemic VTI:  0.13 m MV Decel Time: 339 msec    Systemic Diam: 2.20 cm MV E velocity: 76.05 cm/s Ida Rogue MD Electronically signed by Ida Rogue MD Signature Date/Time: 05/27/2020/7:33:59 PM    Final         Scheduled Meds:  folic acid  1 mg Oral Daily   furosemide  20 mg Oral Daily   insulin aspart  0-20 Units Subcutaneous TID WC   insulin aspart  0-5 Units Subcutaneous QHS   insulin aspart  15 Units Subcutaneous TID WC   insulin glargine  60 Units Subcutaneous QHS   levothyroxine  50 mcg Oral QAC breakfast   metoprolol succinate  25 mg Oral QPM   rosuvastatin  5 mg Oral Daily   sodium chloride flush  3 mL Intravenous Q12H   triamcinolone cream   Topical BID   Continuous Infusions:  magnesium sulfate bolus IVPB        LOS: 1 day    Time spent: 28 minutes    Sharen Hones, MD [facility]   To contact the attending provider between 7A-7P or the covering provider during after hours 7P-7A, please log into the web site [email or web address] and access using universal [place] password for that web site. If you do not have the password, please call the hospital operator.  05/28/2020, 2:36 PM

## 2020-05-28 NOTE — Progress Notes (Signed)
Progress Note  Patient Name: Andres TOSO Sr. Date of Encounter: 05/28/2020  Primary Cardiologist: Kathlyn Sacramento, MD/ EP Olin Pia, MD   Subjective   No chest pain, shortness of breath, presyncope, or syncope.  Eager to go home.  Awaiting device interrogation today.  Inpatient Medications    Scheduled Meds: . carvedilol  6.25 mg Oral BID  . folic acid  1 mg Oral Daily  . furosemide  20 mg Oral Daily  . insulin aspart  0-20 Units Subcutaneous TID WC  . insulin aspart  0-5 Units Subcutaneous QHS  . insulin aspart  15 Units Subcutaneous TID WC  . insulin glargine  60 Units Subcutaneous QHS  . levothyroxine  50 mcg Oral QAC breakfast  . rosuvastatin  5 mg Oral Daily  . sodium chloride flush  3 mL Intravenous Q12H  . spironolactone  25 mg Oral Daily  . triamcinolone cream   Topical BID   Continuous Infusions: . magnesium sulfate bolus IVPB     PRN Meds: acetaminophen   Vital Signs    Vitals:   05/27/20 2300 05/28/20 0545 05/28/20 0723 05/28/20 1142  BP:  (!) 119/58 (!) 106/55 112/70  Pulse:  77 63 77  Resp: 17  16 17   Temp:  98.1 F (36.7 C) 97.9 F (36.6 C) (!) 97.5 F (36.4 C)  TempSrc:  Oral    SpO2:  94% 94% 99%  Weight:  92.4 kg    Height:        Intake/Output Summary (Last 24 hours) at 05/28/2020 1304 Last data filed at 05/28/2020 1246 Gross per 24 hour  Intake 240 ml  Output 3300 ml  Net -3060 ml   Filed Weights   05/27/20 0940 05/27/20 1415 05/28/20 0545  Weight: 100.2 kg 94.7 kg 92.4 kg    Physical Exam   GEN: Well nourished, well developed, in no acute distress.  HEENT: Grossly normal.  Neck: Supple, no JVD, carotid bruits, or masses. Cardiac: IR, IR, no murmurs, rubs, or gallops. No clubbing, cyanosis, edema.  Radials/DP/PT 2+ and equal bilaterally.  Respiratory:  Respirations regular and unlabored, clear to auscultation bilaterally. GI: Soft, nontender, nondistended, BS + x 4. MS: no deformity or atrophy. Skin: warm and dry, no  rash. Neuro:  Strength and sensation are intact. Psych: AAOx3.  Normal affect.  Labs    Chemistry Recent Labs  Lab 05/26/20 1136 05/27/20 0544 05/28/20 0522  NA 137 135 136  K 4.3 4.3 4.5  CL 105 103 99  CO2 22 24 27   GLUCOSE 179* 220* 183*  BUN 29* 25* 25*  CREATININE 1.53* 1.27* 1.36*  CALCIUM 9.0 9.1 9.2  GFRNONAA 43* 53* 49*  GFRAA 49* >60 57*  ANIONGAP 10 8 10      Hematology Recent Labs  Lab 05/26/20 1136 05/27/20 0544 05/28/20 0522  WBC 8.2 10.5 9.0  RBC 4.20* 4.18* 4.69  HGB 14.2 14.3 16.0  HCT 40.2 41.1 45.5  MCV 95.7 98.3 97.0  MCH 33.8 34.2* 34.1*  MCHC 35.3 34.8 35.2  RDW 16.2* 15.9* 16.0*  PLT 206 191 208    Cardiac Enzymes  Recent Labs  Lab 05/26/20 1136 05/27/20 0544 05/27/20 1804 05/27/20 2016  TROPONINIHS 13 24* 20* 19*      Radiology    CT HEAD WO CONTRAST  Result Date: 05/28/2020 CLINICAL DATA:  Subdural hematoma. EXAM: CT HEAD WITHOUT CONTRAST TECHNIQUE: Contiguous axial images were obtained from the base of the skull through the vertex without intravenous contrast. COMPARISON:  May 26, 2020. FINDINGS: Brain: Mild diffuse cortical atrophy is noted. Grossly stable small interhemispheric subdural hematoma is noted posteriorly. No mass effect or midline shift is noted. Ventricular size is within normal limits. No new hemorrhage is noted. No acute infarction or mass lesion is noted. Vascular: No hyperdense vessel or unexpected calcification. Skull: Normal. Negative for fracture or focal lesion. Sinuses/Orbits: No acute finding. Other: Grossly stable left-sided posterior scalp hematoma is noted with overlying sutures. IMPRESSION: Grossly stable small interhemispheric subdural hematoma. Grossly stable left-sided posterior scalp hematoma is noted with overlying sutures. Electronically Signed   By: Marijo Conception M.D.   On: 05/28/2020 08:13   CT Head Wo Contrast  Result Date: 05/26/2020 CLINICAL DATA:  Head injury.  Fall.  Follow-up subdural  hematoma EXAM: CT HEAD WITHOUT CONTRAST TECHNIQUE: Contiguous axial images were obtained from the base of the skull through the vertex without intravenous contrast. COMPARISON:  CT head earlier today FINDINGS: Brain: Small interhemispheric subdural hematoma posteriorly appears unchanged. No new area of hemorrhage. Generalized atrophy.  No acute infarct or mass. Vascular: Negative for hyperdense vessel. Skull: Negative for skull fracture. Left parietal scalp hematoma and laceration with staples. Sinuses/Orbits: Paranasal sinuses clear.  Negative orbit Other: None IMPRESSION: Small posterior interhemispheric subdural hematoma is stable. No new area of hemorrhage. Electronically Signed   By: Franchot Gallo M.D.   On: 05/26/2020 18:53   CT Head Wo Contrast  Addendum Date: 05/26/2020   ADDENDUM REPORT: 05/26/2020 12:49 ADDENDUM: Study discussed by telephone with Dr. Blake Divine on 05/26/2020 at 1225 hours. Electronically Signed   By: Genevie Ann M.D.   On: 05/26/2020 12:49   Result Date: 05/26/2020 CLINICAL DATA:  79 year old male status post syncope and fall in parking lot. Head laceration. EXAM: CT HEAD WITHOUT CONTRAST TECHNIQUE: Contiguous axial images were obtained from the base of the skull through the vertex without intravenous contrast. COMPARISON:  None. FINDINGS: Brain: Trace para falcine subdural hematoma suspected on series 3, images 19 and 21. No other intracranial hemorrhage or extra-axial fluid collection identified. No intracranial mass effect. No ventriculomegaly or intraventricular hemorrhage. Gray-white matter differentiation is within normal limits for age. No cortically based acute infarct identified. No encephalomalacia identified. No evidence of intracranial mass lesion. Vascular: Calcified atherosclerosis at the skull base. No suspicious intracranial vascular hyperdensity. Skull: No fracture or osseous abnormality identified. Incidental occipital bone arachnoid granulations (normal variant).  Sinuses/Orbits: Visualized paranasal sinuses and mastoids are clear. Other: Left posterosuperior scalp convexity soft tissue injury with hematoma and trace soft tissue gas. Underlying calvarium appears intact. Other scalp and orbits soft tissues appear negative. IMPRESSION: 1. Trace amount of parafalcine subdural hematoma. No intracranial mass effect and no other acute traumatic injury to the brain identified. 2. Left posterosuperior scalp soft tissue injury without underlying skull fracture. Electronically Signed: By: Genevie Ann M.D. On: 05/26/2020 12:19   CT Cervical Spine Wo Contrast  Result Date: 05/26/2020 CLINICAL DATA:  79 year old male status post syncope and fall in parking lot. Head laceration. EXAM: CT CERVICAL SPINE WITHOUT CONTRAST TECHNIQUE: Multidetector CT imaging of the cervical spine was performed without intravenous contrast. Multiplanar CT image reconstructions were also generated. COMPARISON:  Head CT today reported separately. FINDINGS: Alignment: Straightening of cervical lordosis with subtle anterolisthesis at both C2-C3 and C4-C5, which appears chronic and is associated with facet arthropathy at both levels. Cervicothoracic junction alignment is within normal limits. Bilateral posterior element alignment is within normal limits. Skull base and vertebrae: Visualized skull base is intact. No  atlanto-occipital dissociation. No acute osseous abnormality identified. Soft tissues and spinal canal: No prevertebral fluid or swelling. No visible canal hematoma. Negative noncontrast neck soft tissues. Disc levels: Bulky degenerative changes at the craniocervical junction including ligamentous hypertrophy and dystrophic calcification / ossification about the odontoid. Small chronic appearing endplate ossific fragments also superiorly at C4. Widespread disc bulging and endplate spurring. With up to mild degenerative cervical spinal stenosis. Upper chest: Visible upper thoracic levels appear intact.  Partially visible left subclavian pacemaker type leads. Mild emphysema in the lung apices. Other: Head CT today reported separately. IMPRESSION: Widespread advanced cervical spine degeneration but no acute traumatic injury identified. Electronically Signed   By: Genevie Ann M.D.   On: 05/26/2020 12:24    Telemetry    Atrial fibrillation, PVCs, V pacing on demand-60s to 70s- Personally Reviewed  Cardiac Studies   2D Echocardiogram 7.1.2021  1. Left ventricular ejection fraction, by estimation, is 20 to 25%. The  left ventricle has severely decreased function. The left ventricle  demonstrates global hypokinesis. The left ventricular internal cavity size  was mildly dilated. Left ventricular  diastolic parameters are indeterminate.   2. Right ventricular systolic function is mildly reduced. The right  ventricular size is mildly enlarged. There is mildly elevated pulmonary  artery systolic pressure. The estimated right ventricular systolic  pressure is 90.3 mmHg.   3. Tricuspid valve regurgitation is moderate.  _____________   Patient Profile     Andres Gandy Sr. is a 79 y.o. 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 Roosevelt Locks.  Assessment & Plan    1.  Syncope/ventricular tachycardia: Patient presented early on June 30 following a syncopal episode that occurred after lifting multiple heavy bags of concrete.  He has had no recurrent presyncope or syncope and no significant arrhythmias noted on telemetry.  Device just interrogated  and he did experience ventricular tachycardia at a rate of 250 bpm followed by antitachycardia pacing, and then monomorphic VT which broke spontaneously.  It is notable that high-sensitivity troponin was only minimally elevated at 24 thus making ACS/ischemia as a potential cause of arrhythmia much less likely.  We have reached out to his primary electrophysiologist for guidance as antiarrhythmic therapy is likely appropriate.  Continue beta-blocker therapy.  2.  Coronary artery disease: No chest pain or dyspnea leading up to events.  As noted, peak high-sensitivity troponin of 24, ruling out ACS.  Continue beta-blocker and statin therapy.  He is not on aspirin at home secondary to chronic Xarelto therapy.  3.  HFrEF/mixed ischemic and nonischemic cardiomyopathy: Euvolemic on examination.  No recent complaints of dyspnea.  He remains on beta-blocker and spironolactone therapy.  He was previously on enalapril in the outpatient setting though this was initially held on admission in the setting of mild creatinine elevation.  We will look to resume either enalapril or transition to [facility] if financially feasible.  4.  Acute on chronic stage II kidney disease: Creatinine stable.  5.  Permanent atrial fibrillation: Rate controlled on beta-blocker therapy.  Xarelto  on hold for at least the next 2 weeks in the setting of subdural hematoma status post syncope and fall.  He will require repeat CT for stability prior to resumption.  6.  Subdural hematoma: In the setting of #1.  He was seen by neurosurgery with recommendation to hold Xarelto for at least 2 weeks with repeat CT at that time to assess for stability.  Signed, Murray Hodgkins, NP  05/28/2020, 1:04 PM    For questions or updates, please contact   Please consult [email or web address] for contact info under Cardiology/STEMI.

## 2020-05-28 NOTE — Plan of Care (Signed)
  Problem: Health Behavior/Discharge Planning: Goal: Ability to manage health-related needs will improve Outcome: Progressing   Problem: Activity: Goal: Risk for activity intolerance will decrease Outcome: Progressing   Problem: Pain Managment: Goal: General experience of comfort will improve Outcome: Progressing   Problem: Safety: Goal: Ability to remain free from injury will improve Outcome: Progressing   

## 2020-05-29 ENCOUNTER — Inpatient Hospital Stay: Payer: Medicare Other

## 2020-05-29 LAB — GLUCOSE, CAPILLARY
Glucose-Capillary: 153 mg/dL — ABNORMAL HIGH (ref 70–99)
Glucose-Capillary: 140 mg/dL — ABNORMAL HIGH (ref 70–99)
Glucose-Capillary: 257 mg/dL — ABNORMAL HIGH (ref 70–99)

## 2020-05-29 MED ORDER — METOPROLOL SUCCINATE ER 25 MG PO TB24: 25.0000 mg | ORAL_TABLET | Freq: Every evening | ORAL | 0 refills | Status: DC

## 2020-05-29 NOTE — Progress Notes (Addendum)
Progress Note  Patient Name: Andres STAMEY Sr. Date of Encounter: 05/29/2020  [facility] Cardiologist: Kathlyn Sacramento, MD   Subjective   Less dizziness this morning.  No chest pain, shortness of breath, palpitations, edema, or headache.  Inpatient Medications    Scheduled Meds: . folic acid  1 mg Oral Daily  . furosemide  20 mg Oral Daily  . insulin aspart  0-20 Units Subcutaneous TID WC  . insulin aspart  0-5 Units Subcutaneous QHS  . insulin aspart  15 Units Subcutaneous TID WC  . insulin glargine  60 Units Subcutaneous QHS  . levothyroxine  50 mcg Oral QAC breakfast  . metoprolol succinate  25 mg Oral QPM  . rosuvastatin  5 mg Oral Daily  . sodium chloride flush  3 mL Intravenous Q12H  . triamcinolone cream   Topical BID   Continuous Infusions: . magnesium sulfate bolus IVPB     PRN Meds: acetaminophen, meclizine   Vital Signs    Vitals:   05/28/20 1945 05/28/20 2330 05/29/20 0447 05/29/20 0707  BP: (!) 114/57  126/83 (!) 127/97  Pulse: 76  76 78  Resp: 20 15 20 18   Temp: 97.7 F (36.5 C)  97.6 F (36.4 C) 97.6 F (36.4 C)  TempSrc: Oral  Oral Oral  SpO2: 98%  97% 98%  Weight:   93.8 kg   Height:        Intake/Output Summary (Last 24 hours) at 05/29/2020 0855 Last data filed at 05/29/2020 0732 Gross per 24 hour  Intake 120 ml  Output 2620 ml  Net -2500 ml   Last 3 Weights 05/29/2020 05/28/2020 05/27/2020  Weight (lbs) 206 lb 12.8 oz 203 lb 12.8 oz 208 lb 11.2 oz  Weight (kg) 93.804 kg 92.443 kg 94.666 kg      Telemetry    Atrial fibrillation with occasional PVCs versus aberrancy and demand ventricular pacing - Personally Reviewed  ECG    No new tracing  Physical Exam   GEN: No acute distress.   Neck: No JVD. Cardiac:  Irregularly irregular without murmurs, rubs, or gallops. Respiratory: Clear to auscultation bilaterally. GI: Soft, nontender, non-distended  MS: No edema; No deformity. Neuro:  Nonfocal  Psych: Normal affect   Labs     High Sensitivity Troponin:   Recent Labs  Lab 05/26/20 1136 05/27/20 0544 05/27/20 1804 05/27/20 2016  TROPONINIHS 13 24* 20* 19*      Chemistry Recent Labs  Lab 05/26/20 1136 05/27/20 0544 05/28/20 0522  NA 137 135 136  K 4.3 4.3 4.5  CL 105 103 99  CO2 22 24 27   GLUCOSE 179* 220* 183*  BUN 29* 25* 25*  CREATININE 1.53* 1.27* 1.36*  CALCIUM 9.0 9.1 9.2  GFRNONAA 43* 53* 49*  GFRAA 49* >60 57*  ANIONGAP 10 8 10      Hematology Recent Labs  Lab 05/26/20 1136 05/27/20 0544 05/28/20 0522  WBC 8.2 10.5 9.0  RBC 4.20* 4.18* 4.69  HGB 14.2 14.3 16.0  HCT 40.2 41.1 45.5  MCV 95.7 98.3 97.0  MCH 33.8 34.2* 34.1*  MCHC 35.3 34.8 35.2  RDW 16.2* 15.9* 16.0*  PLT 206 191 208    BNPNo results for input(s): BNP, PROBNP in the last 168 hours.   DDimer No results for input(s): DDIMER in the last 168 hours.   Radiology    CT HEAD WO CONTRAST  Result Date: 05/28/2020 CLINICAL DATA:  Subdural hematoma. EXAM: CT HEAD WITHOUT CONTRAST TECHNIQUE: Contiguous axial images were  obtained from the base of the skull through the vertex without intravenous contrast. COMPARISON:  May 26, 2020. FINDINGS: Brain: Mild diffuse cortical atrophy is noted. Grossly stable small interhemispheric subdural hematoma is noted posteriorly. No mass effect or midline shift is noted. Ventricular size is within normal limits. No new hemorrhage is noted. No acute infarction or mass lesion is noted. Vascular: No hyperdense vessel or unexpected calcification. Skull: Normal. Negative for fracture or focal lesion. Sinuses/Orbits: No acute finding. Other: Grossly stable left-sided posterior scalp hematoma is noted with overlying sutures. IMPRESSION: Grossly stable small interhemispheric subdural hematoma. Grossly stable left-sided posterior scalp hematoma is noted with overlying sutures. Electronically Signed   By: Marijo Conception M.D.   On: 05/28/2020 08:13   ECHOCARDIOGRAM COMPLETE  Result Date: 05/27/2020     ECHOCARDIOGRAM REPORT   Patient Name:   [name] Date of Exam: 05/27/2020 Medical Rec #:  [id number]                 Height:       70.0 in Accession #:    [id number]                Weight:       208.0 lb Date of Birth:  12/13/1940                 BSA:          2.122 m Patient Age:    79 years                  BP:           136/81 mmHg Patient Gender: M                         HR:           77 bpm. Exam Location:  [facility] Procedure: 2D Echo, Color Doppler, Cardiac Doppler and Intracardiac            Opacification Agent Indications:     R55 Syncope  History:         Patient has prior history of Echocardiogram examinations. CHF,                  CAD, Defibrillator; Risk Factors:Sleep Apnea and Dyslipidemia.  Sonographer:     Charmayne Sheer RDCS (AE) Referring Phys:  [id number] Andres [name] Diagnosing Phys: Ida Rogue MD  Sonographer Comments: Suboptimal apical window and suboptimal subcostal window. IMPRESSIONS  1. Left ventricular ejection fraction, by estimation, is 20 to 25%. The left ventricle has severely decreased function. The left ventricle demonstrates global hypokinesis. The left ventricular internal cavity size was mildly dilated. Left ventricular diastolic parameters are indeterminate.  2. Right ventricular systolic function is mildly reduced. The right ventricular size is mildly enlarged. There is mildly elevated pulmonary artery systolic pressure. The estimated right ventricular systolic pressure is 92.3 mmHg.  3. Tricuspid valve regurgitation is moderate. FINDINGS  Left Ventricle: Left ventricular ejection fraction, by estimation, is 20 to 25%. The left ventricle has severely decreased function. The left ventricle demonstrates global hypokinesis. Definity contrast agent was given IV to delineate the left ventricular endocardial borders. The left ventricular internal cavity size was mildly dilated. There is no left ventricular hypertrophy. Left ventricular diastolic parameters are indeterminate.  Right Ventricle: The right ventricular size is mildly enlarged. No increase in right ventricular wall thickness. Right ventricular systolic function is mildly reduced. There is  mildly elevated pulmonary artery systolic pressure. The tricuspid regurgitant  velocity is 2.39 m/s, and with an assumed right atrial pressure of 10 mmHg, the estimated right ventricular systolic pressure is 32.6 mmHg. Left Atrium: Left atrial size was normal in size. Right Atrium: Right atrial size was normal in size. Pericardium: There is no evidence of pericardial effusion. Mitral Valve: The mitral valve is normal in structure. Normal mobility of the mitral valve leaflets. No evidence of mitral valve regurgitation. No evidence of mitral valve stenosis. MV peak gradient, 3.4 mmHg. The mean mitral valve gradient is 2.0 mmHg. Tricuspid Valve: The tricuspid valve is normal in structure. Tricuspid valve regurgitation is moderate . No evidence of tricuspid stenosis. Aortic Valve: The aortic valve was not well visualized. Aortic valve regurgitation is not visualized. Mild to moderate aortic valve sclerosis/calcification is present, without any evidence of aortic stenosis. Aortic valve mean gradient measures 2.0 mmHg.  Aortic valve peak gradient measures 4.8 mmHg. Aortic valve area, by VTI measures 2.92 cm. Pulmonic Valve: The pulmonic valve was normal in structure. Pulmonic valve regurgitation is not visualized. No evidence of pulmonic stenosis. Aorta: The aortic root is normal in size and structure. Venous: The inferior vena cava is normal in size with greater than 50% respiratory variability, suggesting right atrial pressure of 3 mmHg. IAS/Shunts: No atrial level shunt detected by color flow Doppler. Additional Comments: A pacer wire is visualized.  LEFT VENTRICLE PLAX 2D LVIDd:         6.38 cm  Diastology LVIDs:         5.69 cm  LV e' lateral:   10.20 cm/s LV PW:         0.87 cm  LV E/e' lateral: 7.5 LV IVS:        0.60 cm  LV e' medial:     9.46 cm/s LVOT diam:     2.20 cm  LV E/e' medial:  8.0 LV SV:         48 LV SV Index:   23 LVOT Area:     3.80 cm  LEFT ATRIUM             Index LA diam:        4.30 cm 2.03 cm/m LA Vol (A2C):   78.8 ml 37.13 ml/m LA Vol (A4C):   89.3 ml 42.07 ml/m LA Biplane Vol: 83.1 ml 39.15 ml/m  AORTIC VALVE                   PULMONIC VALVE AV Area (Vmax):    2.28 cm    PV Vmax:       0.88 m/s AV Area (Vmean):   2.37 cm    PV Vmean:      61.100 cm/s AV Area (VTI):     2.92 cm    PV VTI:        0.124 m AV Vmax:           109.00 cm/s PV Peak grad:  3.1 mmHg AV Vmean:          71.600 cm/s PV Mean grad:  2.0 mmHg AV VTI:            0.164 m AV Peak Grad:      4.8 mmHg AV Mean Grad:      2.0 mmHg LVOT Vmax:         65.30 cm/s LVOT Vmean:        44.700 cm/s LVOT VTI:  0.126 m LVOT/AV VTI ratio: 0.77  AORTA Ao Root diam: 3.40 cm MITRAL VALVE               TRICUSPID VALVE MV Area (PHT): 2.24 cm    TR Peak grad:   22.8 mmHg MV Peak grad:  3.4 mmHg    TR Vmax:        239.00 cm/s MV Mean grad:  2.0 mmHg MV Vmax:       0.92 m/s    SHUNTS MV Vmean:      59.7 cm/s   Systemic VTI:  0.13 m MV Decel Time: 339 msec    Systemic Diam: 2.20 cm MV E velocity: 76.05 cm/s Ida Rogue MD Electronically signed by Ida Rogue MD Signature Date/Time: 05/27/2020/7:33:59 PM    Final     Cardiac Studies   1. Left ventricular ejection fraction, by estimation, is 20 to 25%. The  left ventricle has severely decreased function. The left ventricle  demonstrates global hypokinesis. The left ventricular internal cavity size  was mildly dilated. Left ventricular  diastolic parameters are indeterminate.  2. Right ventricular systolic function is mildly reduced. The right  ventricular size is mildly enlarged. There is mildly elevated pulmonary  artery systolic pressure. The estimated right ventricular systolic  pressure is 56.8 mmHg.  3. Tricuspid valve regurgitation is moderate.  Patient Profile     79 y.o. 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  Assessment & Plan    Syncope/ventricular tachycardia: Patient admitted on 6/30 following syncopal episode while doing strenuous activity.  Device interrogation showed ventricular tachycardia that did not respond to ATP but spontaneously converted to sinus rhythm.  He was transitioned from carvedilol to metoprolol per EP recommendations.  Continue metoprolol succinate 25 mg nightly.  I agree that Mr. Wing would benefit from cardiac catheterization to exclude worsening coronary insufficiency as nidus for ventricular tachycardia.  However, in the setting of head trauma with small subdural hematoma and neurosurgery recommendations to hold anticoagulation for 2 weeks, I would favor postponing this until the 2-week recovery period has passed.  Mr. Eimers was reminded to refrain from driving.  Chronic HFrEF: Mr. Helfman appears euvolemic and well compensated.  Due to dizziness and borderline low blood pressures, eplerenone and enalapril have been on hold.  Blood pressure somewhat variable but still low normal.  We discussed reinitiation of enalapril and eplerenone and have agreed to defer this until Mr. Concannon is seen for follow-up.  Continue metoprolol succinate 25 mg nightly and furosemide 20 mg p.o. daily.  Permanent atrial fibrillation: Rate adequately controlled.  Continue metoprolol succinate 25 mg nightly.  Anticoagulation on hold x2 weeks in the setting of subdural hematoma per neurosurgery  recommendations.  Acute on chronic dizziness: Mr. Birdwell reports history of vertigo, though it has been significantly worse since his syncopal episode.  He feels somewhat better today.  I suspect that dizziness is multifactorial including possible concussion, small subdural hematoma, and borderline low blood pressure.  Ongoing management of subdural hematoma per primary team and neurosurgery.  Defer restarting  enalapril and eplerenone until follow-up.  [facility] will sign off.   Medication Recommendations: Continue metoprolol succinate 25 mg nightly and furosemide 20 mg p.o. daily.  Defer reinitiation of anticoagulation, eplerenone, and enalapril. Other recommendations (labs, testing, etc): None. Follow up as an outpatient: Follow-up with Dr. Caryl Comes, [name], or APP in 1 to 2 weeks.  For questions or updates, please contact [place] Please consult [email or web address] for contact info under [facility].     Signed, Nelva Bush, MD  05/29/2020, 8:55 AM

## 2020-05-29 NOTE — Discharge Summary (Addendum)
Physician Discharge Summary  Patient ID: Andres Gandy Sr. MRN: [id number] DOB/AGE: [birth year] 79 y.o.  Admit date: 05/26/2020 Discharge date: 05/29/2020  Admission Diagnoses:  Discharge Diagnoses:  Principal Problem:   VT (ventricular tachycardia) ([place]) Active Problems:   Coronary artery disease   Automatic implantable cardioverter-defibrillator in situ   Atrial fibrillation, persistent-long-term   Syncope   AKI (acute kidney injury) ([place])   Subdural hematoma ([facility])   Insulin dependent type 2 diabetes mellitus ([place])   Discharged Condition: good  Hospital Course:   Andres Richardsis a 79 y.o.malewith medical history significant forVT s/p AICD, CAD s/p CABG, systolic CHF, permanent atrial fibrillation on Xarelto who presents with syncopal episode.He was carrying 10 of 80 pounds bags to hit the truck that day. After he finished the work, he found himself on the floor. He did not remember falling. In the emergency room, he has acute kidney injury, CT head showed subdural hematoma and a left scalp soft tissue injury. Patient had a similar episode3 years ago, at that time, ICD interrogation showed ventricular arrhythmia, shock was fired. Unfortunately, we have not been able to perform ICD interrogation due to network problems, nurse is callingSt Judeand try to perform interrogation.  7/2.  ICD interrogation showed VF at a rate of 240.  Converted to polymorphic VT after ATP, then self terminated.  Repeated CT scan this morning showed stable small subdural hematoma.  Patient has significant dizziness today, probably concussion.  We will keep 1 more day.  Start meclizine as needed.   #1.  Syncope.  Secondary to ventricular arrhythmia. Patient has converted to sinus rhythm by ATP.    Patient was very dizzy today, probably from had a concussion from the fall.    Given meclizine.  Serial CT scan of the head did not show any worsening subdural hematoma.  Patient condition so  far has improved today, is medically stable to be discharged.  2.  Acute kidney injury chronic kidney disease stage IIIa. Review patient previous lab 6 months ago, creatinine level was 1.26 to 1.47.  He has received fluids, now at baseline.  3.  Subdural hematoma. Serial CT scan including today's are stable.  Will hold off anticoagulation until cleared by neurosurgeon.  4.  Permanent atrial fibrillation. Hold anticoagulation.  5.  Chronic systolic congestive heart failure. Continue home medicines.  6.  Type 2 diabetes with hyperglycemia. Continue current regimen   Consults: cardiology and Neurosurgery  Significant Diagnostic Studies:  CT HEAD WITHOUT CONTRAST  TECHNIQUE: Contiguous axial images were obtained from the base of the skull through the vertex without intravenous contrast.  COMPARISON:  CT head earlier today  FINDINGS: Brain: Small interhemispheric subdural hematoma posteriorly appears unchanged. No new area of hemorrhage.  Generalized atrophy.  No acute infarct or mass.  Vascular: Negative for hyperdense vessel.  Skull: Negative for skull fracture. Left parietal scalp hematoma and laceration with staples.  Sinuses/Orbits: Paranasal sinuses clear.  Negative orbit  Other: None  IMPRESSION: Small posterior interhemispheric subdural hematoma is stable. No new area of hemorrhage.   Electronically Signed   By: Franchot Gallo M.D.   On: 05/26/2020 18:53 CT CERVICAL SPINE WITHOUT CONTRAST  TECHNIQUE: Multidetector CT imaging of the cervical spine was performed without intravenous contrast. Multiplanar CT image reconstructions were also generated.  COMPARISON:  Head CT today reported separately.  FINDINGS: Alignment: Straightening of cervical lordosis with subtle anterolisthesis at both C2-C3 and C4-C5, which appears chronic and is associated with facet arthropathy at both levels.  Cervicothoracic junction alignment is within normal  limits. Bilateral posterior element alignment is within normal limits.  Skull base and vertebrae: Visualized skull base is intact. No atlanto-occipital dissociation. No acute osseous abnormality identified.  Soft tissues and spinal canal: No prevertebral fluid or swelling. No visible canal hematoma. Negative noncontrast neck soft tissues.  Disc levels: Bulky degenerative changes at the craniocervical junction including ligamentous hypertrophy and dystrophic calcification / ossification about the odontoid. Small chronic appearing endplate ossific fragments also superiorly at C4. Widespread disc bulging and endplate spurring. With up to mild degenerative cervical spinal stenosis.  Upper chest: Visible upper thoracic levels appear intact. Partially visible left subclavian pacemaker type leads. Mild emphysema in the lung apices.  Other: Head CT today reported separately.  IMPRESSION: Widespread advanced cervical spine degeneration but no acute traumatic injury identified.   Electronically Signed   By: Genevie Ann M.D.   On: 05/26/2020 12:24   Treatments: Clinical observation and monitoring  Discharge Exam: Blood pressure (!) 127/97, pulse 78, temperature 97.6 F (36.4 C), temperature source Oral, resp. rate 18, height 5\' 10"  (1.778 m), weight 93.8 kg, SpO2 98 %. General appearance: alert and cooperative Resp: clear to auscultation bilaterally Cardio: regular rate and rhythm, S1, S2 normal, no murmur, click, rub or gallop and Irregular no murmurs GI: soft, non-tender; bowel sounds normal; no masses,  no organomegaly Extremities: extremities normal, atraumatic, no cyanosis or edema  Disposition: Discharge disposition: 01-Home or Self Care       Discharge Instructions    Diet - low sodium heart healthy   Complete by: As directed    Discharge wound care:   Complete by: As directed    Please see wound care once and provide instruction on dressing changes   Increase  activity slowly   Complete by: As directed      Allergies as of 05/29/2020   No Known Allergies     Medication List    STOP taking these medications   carvedilol 6.25 MG tablet Commonly known as: COREG   Xarelto 20 MG Tabs tablet Generic drug: rivaroxaban     TAKE these medications   Cinnamon 500 MG Tabs Take 1,000 mg by mouth 2 (two) times daily.   CO Q 10 PO Take 300 mg by mouth daily.   diphenhydramine-acetaminophen 25-500 MG Tabs tablet Commonly known as: TYLENOL PM Take 2 tablets by mouth at bedtime as needed.   enalapril 2.5 MG tablet Commonly known as: VASOTEC Take 2.5 mg by mouth daily.   eplerenone 25 MG tablet Commonly known as: INSPRA TAKE ONE-HALF TABLET BY MOUTH EVERY DAY   FISH OIL PO Take by mouth 2 (two) times daily.   fluticasone 50 MCG/ACT nasal spray Commonly known as: FLONASE Place into the nose daily.   folic acid 1 MG tablet Commonly known as: FOLVITE Take 1 mg by mouth daily.   furosemide 20 MG tablet Commonly known as: LASIX TAKE ONE TABLET EVERY DAY   Garlic 1610 MG Caps Take 1 capsule by mouth daily.   GLUCOSAMINE CHONDR 1500 COMPLX PO Take 1,500 mg by mouth 2 (two) times daily.   insulin NPH Human 100 UNIT/ML injection Commonly known as: NOVOLIN N Inject 95 Units into the skin at bedtime.   insulin regular 100 units/mL injection Commonly known as: NOVOLIN R Inject 30 Units into the skin 3 (three) times daily before meals.   ipratropium 0.03 % nasal spray Commonly known as: ATROVENT SMARTSIG:2 Spray(s) Both Nares 3 Times Daily PRN  levothyroxine 50 MCG tablet Commonly known as: SYNTHROID Take 50 mcg by mouth daily before breakfast.   metFORMIN 500 MG tablet Commonly known as: GLUCOPHAGE Take 500 mg tablet in the am with 0.5 tablet in the pm.   methotrexate 2.5 MG tablet Commonly known as: RHEUMATREX as directed. Take 5 (12.5 mg) tablets by mouth every 7 days. Caution:Chemotherapy. Protect from light.    metoprolol succinate 25 MG 24 hr tablet Commonly known as: TOPROL-XL Take 1 tablet (25 mg total) by mouth every evening.   rosuvastatin 5 MG tablet Commonly known as: CRESTOR Take 2 tablets (10mg ) daily alternating 1 tablet (5 mg) daily            Discharge Care Instructions  (From admission, onward)         Start     Ordered   05/29/20 0000  Discharge wound care:       Comments: Please see wound care once and provide instruction on dressing changes   05/29/20 1121          Follow-up Information    [name], MD Follow up in 1 week(s).   Specialty: Family Medicine Contact information: [address] [facility] [place] [address] [phone]        Deboraha Sprang, MD .   Specialty: Cardiology Contact information: [address] [place] [address] [phone]        [name], MD Follow up in 2 week(s).   Specialty: Cardiology Contact information: [place] [address] [phone]        Meade Maw, MD Follow up in 1 week(s).   Specialty: Neurosurgery Contact information: [place] [address] [phone]               Signed: Sharen Hones 05/29/2020, 11:27 AM

## 2020-06-01 ENCOUNTER — Other Ambulatory Visit: Payer: Self-pay | Admitting: Neurosurgery

## 2020-06-01 ENCOUNTER — Telehealth: Payer: Self-pay | Admitting: Internal Medicine

## 2020-06-01 DIAGNOSIS — S065XAA Traumatic subdural hemorrhage with loss of consciousness status unknown, initial encounter: Secondary | ICD-10-CM

## 2020-06-01 NOTE — Telephone Encounter (Signed)
Patient was seen in ED recently Upon discharge patient was told to schedule with Dr Fletcher Anon and Dr Caryl Comes  Patient scheduled 06/07/20 with Vella Raring for Dr Fletcher Anon Patient states he spoke with Dr Caryl Comes and he wanted to see him in this week   Please call to discuss or advise appointment if needed

## 2020-06-01 NOTE — Telephone Encounter (Signed)
We can add him on 06/03/20 at 8:20 am with Dr. Caryl Comes. Do you mind calling him to see if that is ok?   Thanks!

## 2020-06-01 NOTE — Telephone Encounter (Signed)
Scheduled

## 2020-06-03 ENCOUNTER — Encounter: Payer: Self-pay | Admitting: Internal Medicine

## 2020-06-03 ENCOUNTER — Ambulatory Visit (INDEPENDENT_AMBULATORY_CARE_PROVIDER_SITE_OTHER): Payer: Medicare Other | Admitting: Internal Medicine

## 2020-06-03 ENCOUNTER — Other Ambulatory Visit: Payer: Self-pay

## 2020-06-03 VITALS — BP 136/87 | HR 80 | Ht 70.0 in | Wt 211.0 lb

## 2020-06-03 DIAGNOSIS — I4821 Permanent atrial fibrillation: Secondary | ICD-10-CM

## 2020-06-03 DIAGNOSIS — I5022 Chronic systolic (congestive) heart failure: Secondary | ICD-10-CM | POA: Diagnosis not present

## 2020-06-03 DIAGNOSIS — I255 Ischemic cardiomyopathy: Secondary | ICD-10-CM | POA: Diagnosis not present

## 2020-06-03 DIAGNOSIS — Z95810 Presence of automatic (implantable) cardiac defibrillator: Secondary | ICD-10-CM | POA: Diagnosis not present

## 2020-06-03 DIAGNOSIS — I472 Ventricular tachycardia, unspecified: Secondary | ICD-10-CM

## 2020-06-03 MED ORDER — MECLIZINE HCL 25 MG PO TABS: 25.0000 mg | ORAL_TABLET | Freq: Three times a day (TID) | ORAL | 0 refills | Status: DC | PRN

## 2020-06-03 MED ORDER — SACUBITRIL-VALSARTAN 24-26 MG PO TABS: 1.0000 | ORAL_TABLET | Freq: Two times a day (BID) | ORAL | 6 refills | Status: DC

## 2020-06-03 NOTE — Progress Notes (Signed)
Patient Care Team: [name], MD as PCP - General (Family Medicine) Deboraha Sprang, MD as PCP - Electrophysiology (Cardiology) [name], MD as PCP - Cardiology (Cardiology)   HPI  Andres Gandy Sr. is a 79 y.o. male Seen in followup for ICD implanted for ischemic/nonischemic cardiomyopathy  and inducible ventricular tachycardia -April 2013 catheterization April 2013 demonstrating patent single vein graft to the OM. bypass had been  done emergently in [place] generator replacement 4/19 (GT)   DATE TEST EF   2011  Echo [place]  14 %   12/16 [facility]  15 % SVG-OM T; LAD/RCA non obstru  8/20 Echo  30-35% LAE mod  7/21 Echo  20-25%        Appropriate Therapy yes  VT monomorphic 5/18 Cl 220--240    7/21 Had been carrying 80 pound bags with no chest pain but mild shortness of breath.  had syncope with device documented fast ventricular tachycardia, accelerated with ATP and then spontaneously terminating.  CT demonstrated subdural hematoma with repeat scans showing no change.  Anticoagulation (Xarelto) was held.  Otherwise there have been no edema nocturnal dyspnea or orthopnea. Neurosurgical evaluation is pending; staples are in the scalp  Told to resume Rivaroxaban at 2 weeks     Orthostatic intolerance improved d Date Cr K Hgb  4/18  1.4 5.2    4/19 0.36 4.2 14.3  5/20 1.3 4.8 (2/20) 15.6  7/21 1.36 4.5 28.3    Thromboembolic risk factors ( age  -2, HTN-1, DM-1, Vasc disease -1, CHF-1) for a CHADSVASc Score of >=6   He has permanent atrial fibrillation since April 2013.     Past Medical History:  Diagnosis Date  . Arthritis   . Atrial fibrillation, persistent-long-term   . Chronic systolic congestive heart failure ([place])    a. 03/2015 Echo: EF 30-35%, diff HK, mild MR, mod dil LA, mildly dil RA, mild TR.  [name] Coronary artery disease    a. 1994 s/p CABG x 1 (VG->OM3);  b. 10/2015 Cath: LM nl, LAD min irregs, D1 nl, D2 min irregs, LCX 61m, OM2 nl,  OM3 90 (small territory), VG->OM3 100, RCA min irregs, RPDA/RPL1/RPL2/RPL3 nl, EF 25%-->Med Rx.  [name] Hx of squamous cell carcinoma of skin 06/04/2013   L upper arm  . Hyperlipidemia   . ICD (implantable cardiac defibrillator) in place    a. 02/2012 s/p SJM 1257-40Q Fortify Assurance single lead AICD.  [name] Mixed Ischemic and Non-ischemic Cardiomyopathy    a. 07/2012 s/p SJM AICD (RV lead only);  b. 03/2015 Echo: EF 30-35%, diff HK;  c. EF 25% by LV gram.  . Nephrolithiasis 1970's  . Polymyalgia rheumatica (HCC)    On steroids  . Sleep apnea    On CPAP  . Type II diabetes mellitus ([place])   . Ventricular tachycardia -inducible at EP testing    a. 07/2012 s/ SJM AICD.    Past Surgical History:  Procedure Laterality Date  . basel cell removal     left arm  . CARDIAC CATHETERIZATION  02/28/2012   Minor disease in LAD and RCA, LCX: 70% mid and occluded distally. Patent SVG to OM3  . CARDIAC CATHETERIZATION N/A 11/08/2015   Procedure: Left Heart Cath and Cors/Grafts Angiography;  Surgeon: [name], MD;  Location: [facility];  Service: Cardiovascular;  Laterality: N/A;  . CARDIOVERSION  03/01/2012   Procedure: CARDIOVERSION;  Surgeon: Deboraha Sprang, MD;  Location: [facility];  Service: Cardiovascular;  Laterality: N/A;  . CATARACT EXTRACTION W/ INTRAOCULAR LENS  IMPLANT, BILATERAL  2011  . CORONARY ARTERY BYPASS GRAFT  1994   [place] ; CABG X1  . ELECTROPHYSIOLOGY STUDY N/A 02/28/2012   Procedure: ELECTROPHYSIOLOGY STUDY;  Surgeon: Deboraha Sprang, MD;  Location: [facility];  Service: Cardiovascular;  Laterality: N/A;  . ICD GENERATOR CHANGEOUT N/A 03/18/2018   Procedure: [place];  Surgeon: Evans Lance, MD;  Location: [facility];  Service: Cardiovascular;  Laterality: N/A;  . INSERT / REPLACE / REMOVE PACEMAKER  02/28/12   "w/defibrillator"  . JOINT REPLACEMENT    . KNEE ARTHROSCOPY  2012   right  . LEFT HEART CATHETERIZATION WITH CORONARY/GRAFT ANGIOGRAM N/A  02/28/2012   Procedure: LEFT HEART CATHETERIZATION WITH [name];  Surgeon: [name], MD;  Location: [facility] CATH LAB;  Service: Cardiovascular;  Laterality: N/A;  . TOTAL KNEE ARTHROPLASTY  2011   left    Current Outpatient Medications  Medication Sig Dispense Refill  . Cinnamon 500 MG TABS Take 1,000 mg by mouth 2 (two) times daily.     . Coenzyme Q10 (CO Q 10 PO) Take 300 mg by mouth daily.    . diphenhydramine-acetaminophen (TYLENOL PM) 25-500 MG TABS tablet Take 2 tablets by mouth at bedtime as needed.    . enalapril (VASOTEC) 2.5 MG tablet Take 2.5 mg by mouth daily.    [name] eplerenone (INSPRA) 25 MG tablet TAKE ONE-HALF TABLET BY MOUTH EVERY DAY 45 tablet 2  . fluticasone (FLONASE) 50 MCG/ACT nasal spray Place into the nose daily.    . folic acid (FOLVITE) 1 MG tablet Take 1 mg by mouth daily.     . furosemide (LASIX) 20 MG tablet TAKE ONE TABLET EVERY DAY 30 tablet 5  . Garlic 8832 MG CAPS Take 1 capsule by mouth daily.     . Glucosamine-Chondroit-Vit C-Mn (GLUCOSAMINE CHONDR 1500 COMPLX PO) Take 1,500 mg by mouth 2 (two) times daily.     . insulin NPH Human (HUMULIN N,NOVOLIN N) 100 UNIT/ML injection Inject 95 Units into the skin at bedtime.     . insulin regular (NOVOLIN R,HUMULIN R) 100 units/mL injection Inject 30 Units into the skin 3 (three) times daily before meals.     [name] ipratropium (ATROVENT) 0.03 % nasal spray SMARTSIG:2 Spray(s) Both Nares 3 Times Daily PRN    . levothyroxine (SYNTHROID, LEVOTHROID) 50 MCG tablet Take 50 mcg by mouth daily before breakfast.     . metFORMIN (GLUCOPHAGE) 500 MG tablet Take 500 mg tablet in the am with 0.5 tablet in the pm.    . methotrexate (RHEUMATREX) 2.5 MG tablet as directed. Take 5 (12.5 mg) tablets by mouth every 7 days. Caution:Chemotherapy. Protect from light.    . metoprolol succinate (TOPROL-XL) 25 MG 24 hr tablet Take 1 tablet (25 mg total) by mouth every evening. 60 tablet 0  . Omega-3 Fatty Acids (FISH OIL PO)  Take by mouth 2 (two) times daily.     . rosuvastatin (CRESTOR) 5 MG tablet Take 2 tablets (10mg ) daily alternating 1 tablet (5 mg) daily 45 tablet 11   No current facility-administered medications for this visit.    No Known Allergies  Review of Systems negative except from HPI and PMH  Physical Exam BP 136/87 (BP Location: Left Arm, Patient Position: Sitting, Cuff Size: Normal)   Pulse 80   Ht 5\' 10"  (1.778 m)   Wt 211 lb (95.7  kg)   BMI 30.28 kg/m  Well developed and well nourished in no acute distress HENT normal Neck supple with JVP-flat Clear Device pocket well healed; without hematoma or erythema.  There is no tethering  irregular rate and rhythm, no  gallop No  murmur Abd-soft with active BS No Clubbing cyanosis   edema Skin-warm and dry A & Oriented  Grossly normal sensory and motor function  ECG atrial fib    Assessment and  Plan  Congestive heart failure-chronic-systolic  Atrial fibrillation-permanent  Cardiomyopathy ischemic/nonischemic  Orthostatic intolerance-improved  High Risk Medication Surveillance   Ventricular tachycardia -monomorphic-rapid  ICD single chamber-[employer]   Intercurrent ventricular tachycardia cycle length of about 240 ms with rapid ATP resulting in a transition to a polymorphic rhythm and then spontaneous termination.  It was syncopal.  Last event was 2018.  The triggering circumstances suggest ischemia as a trigger with the circumstances and a rapid atrial fibrillation preceding the VT.  "I have learned my lesson" given the relative infrequency of the events, antiarrhythmic therapy either with catheter ablation or antiarrhythmic drugs is probably not indicated yet.  The former would be more challenging to the degree that the VT is related to his nonischemic myopathic process versus his ischemic one  Reviewing notes from Dr. Audelia Acton, we will take this opportunity to switch him from enalapril to [facility].  He is advised to let us  know if he has significant lightheadedness.  There are strong data that Entresto decreases ICD therapies.  No driving x 6 mo

## 2020-06-03 NOTE — Patient Instructions (Addendum)
Medication Instructions:  - Your physician has recommended you make the following change in your medication:   1) STOP enalapril  2) After 36 hours off of enalapril, you will start:  - Entresto 24/26 mg- take 1 tablet by mouth twice daily   - please take the "free trial offer" paper that was printed off for you when you go to pick up your 1st prescription  3) Start meclizine 25 mg- take 1 tablet by mouth three times a day as needed for dizziness  *If you need a refill on your cardiac medications before your next appointment, please call your pharmacy*   Lab Work: - none ordered  If you have labs (blood work) drawn today and your tests are completely normal, you will receive your results only by: [name] MyChart Message (if you have MyChart) OR . A paper copy in the mail If you have any lab test that is abnormal or we need to change your treatment, we will call you to review the results.   Testing/Procedures: -  None ordered   Follow-Up: At [facility], you and your health needs are our priority.  As part of our continuing mission to provide you with exceptional heart care, we have created designated Provider Care Teams.  These Care Teams include your primary Cardiologist (physician) and Advanced Practice Providers (APPs -  Physician Assistants and Nurse Practitioners) who all work together to provide you with the care you need, when you need it.  We recommend signing up for the patient portal called "MyChart".  Sign up information is provided on this After Visit Summary.  MyChart is used to connect with patients for Virtual Visits (Telemedicine).  Patients are able to view lab/test results, encounter notes, upcoming appointments, etc.  Non-urgent messages can be sent to your provider as well.   To learn more about what you can do with MyChart, go to [email or web address].    Your next appointment:    1) 6 weeks with [name] APP  2) 3 months with Dr. Caryl Comes  The format for  your next appointment:   In Person  Provider:   As above   Other Instructions  - No Driving x 6 months from 05/28/20

## 2020-06-07 ENCOUNTER — Ambulatory Visit: Payer: Medicare Other | Admitting: Family

## 2020-06-22 ENCOUNTER — Ambulatory Visit: Payer: Medicare Other

## 2020-06-23 ENCOUNTER — Telehealth: Payer: Self-pay

## 2020-06-23 ENCOUNTER — Other Ambulatory Visit: Payer: Self-pay

## 2020-06-23 ENCOUNTER — Ambulatory Visit
Admission: RE | Admit: 2020-06-23 | Discharge: 2020-06-23 | Disposition: A | Discharge status: Home / Self Care | Payer: Medicare Other | Source: Ambulatory Visit | Attending: Neurosurgery | Admitting: Neurosurgery

## 2020-06-23 DIAGNOSIS — S065XAA Traumatic subdural hemorrhage with loss of consciousness status unknown, initial encounter: Secondary | ICD-10-CM

## 2020-06-23 DIAGNOSIS — S065X9A Traumatic subdural hemorrhage with loss of consciousness of unspecified duration, initial encounter: Secondary | ICD-10-CM | POA: Insufficient documentation

## 2020-06-23 NOTE — Telephone Encounter (Signed)
PA started through [employer]  Key: [id number]  PA Case ID: [id number]  Rx #: [id number]  [employer] is reviewing your PA request. Typically an electronic response will be received within 72 hours. To check for an update later, open this request from your dashboard.  You may close this dialog and return to your dashboard to perform other tasks.

## 2020-07-14 NOTE — Progress Notes (Signed)
Office Visit    Patient Name: Andres TUMMINELLO Sr. Date of Encounter: 07/16/2020  Primary Care Provider:  [name], MD Primary Cardiologist:  Kathlyn Sacramento, MD Electrophysiologist:  Virl Axe, MD   Chief Complaint    Andres Gandy Sr. is a 79 y.o. male with a hx of CAD s/p CABG X1 in 1994 with SVG to OM 3 after failed angioplasty on LCx complicated by dissection, HFrEF secondary to mixed ICM/NICM, inducible VT 02/2012 s/p ICD 02/2012, VT/syncope 03/2017 with shock and subsequent device adjustment to deliver ATP with generator change out 02/2018, permanent atrial fibrillation on Xarelto, CKD stage II-III, orthostatic intolerance presyncope, PMR, DM2, HTN, HLD, OSA on CPAP, syncope presents today for follow up after initiation of Entresto.   Past Medical History    Past Medical History:  Diagnosis Date  . Arthritis   . Atrial fibrillation, persistent-long-term   . Chronic systolic congestive heart failure ([place])    a. 03/2015 Echo: EF 30-35%, diff HK, mild MR, mod dil LA, mildly dil RA, mild TR.  [name] Coronary artery disease    a. 1994 s/p CABG x 1 (VG->OM3);  b. 10/2015 Cath: LM nl, LAD min irregs, D1 nl, D2 min irregs, LCX 29m, OM2 nl, OM3 90 (small territory), VG->OM3 100, RCA min irregs, RPDA/RPL1/RPL2/RPL3 nl, EF 25%-->Med Rx.  [name] Hx of squamous cell carcinoma of skin 06/04/2013   L upper arm  . Hyperlipidemia   . ICD (implantable cardiac defibrillator) in place    a. 02/2012 s/p SJM 1257-40Q Fortify Assurance single lead AICD.  [name] Mixed Ischemic and Non-ischemic Cardiomyopathy    a. 07/2012 s/p SJM AICD (RV lead only);  b. 03/2015 Echo: EF 30-35%, diff HK;  c. EF 25% by LV gram.  . Nephrolithiasis 1970's  . Polymyalgia rheumatica (HCC)    On steroids  . Sleep apnea    On CPAP  . Type II diabetes mellitus ([place])   . Ventricular tachycardia -inducible at EP testing    a. 07/2012 s/ SJM AICD.   Past Surgical History:  Procedure Laterality Date  . basel cell removal      left arm  . CARDIAC CATHETERIZATION  02/28/2012   Minor disease in LAD and RCA, LCX: 70% mid and occluded distally. Patent SVG to OM3  . CARDIAC CATHETERIZATION N/A 11/08/2015   Procedure: Left Heart Cath and Cors/Grafts Angiography;  Surgeon: [name], MD;  Location: [facility];  Service: Cardiovascular;  Laterality: N/A;  . CARDIOVERSION  03/01/2012   Procedure: CARDIOVERSION;  Surgeon: Deboraha Sprang, MD;  Location: [facility];  Service: Cardiovascular;  Laterality: N/A;  . CATARACT EXTRACTION W/ INTRAOCULAR LENS  IMPLANT, BILATERAL  2011  . CORONARY ARTERY BYPASS GRAFT  1994   [place] ; CABG X1  . ELECTROPHYSIOLOGY STUDY N/A 02/28/2012   Procedure: ELECTROPHYSIOLOGY STUDY;  Surgeon: Deboraha Sprang, MD;  Location: [facility] CATH LAB;  Service: Cardiovascular;  Laterality: N/A;  . ICD GENERATOR CHANGEOUT N/A 03/18/2018   Procedure: [place];  Surgeon: Evans Lance, MD;  Location: [facility];  Service: Cardiovascular;  Laterality: N/A;  . INSERT / REPLACE / REMOVE PACEMAKER  02/28/12   "w/defibrillator"  . JOINT REPLACEMENT    . KNEE ARTHROSCOPY  2012   right  . LEFT HEART CATHETERIZATION WITH CORONARY/GRAFT ANGIOGRAM N/A 02/28/2012   Procedure: LEFT HEART CATHETERIZATION WITH [name];  Surgeon: [name], MD;  Location: [facility] CATH LAB;  Service: Cardiovascular;  Laterality: N/A;  .  TOTAL KNEE ARTHROPLASTY  2011   left    Allergies  No Known Allergies  History of Present Illness    Andres FRIDMAN Sr. is a 79 y.o. male with a hx of CAD s/p CABG X1 in 1994 with SVG to OM 3 after failed angioplasty on LCx complicated by dissection, HFrEF secondary to mixed ICM/NICM, inducible VT 02/2012 s/p ICD 02/2012, VT/syncope 03/2017 with shock and subsequent device adjustment to deliver ATP with generator change out 02/2018, permanent atrial fibrillation on Xarelto, CKD stage II-III, orthostatic intolerance presyncope, PMR, DM2, HTN, HLD, OSA on CPAP, syncope.   He was last seen by Dr. Caryl Comes 06/03/2020.  He is admitted 05/26/2020 following a syncopal episode while doing strenuous activity.  Device interrogation showed VT that did not respond to ATP but spontaneously converted to NSR.  His Coreg was transitioned to metoprolol per EP recommendations.  Echo 05/27/2020 during admission with EF 20-25% (decreased compared to previous echo 8/20, EF 30-35%), LV with global hypokinesis, LV mildly dilated, RV SF mildly reduced, RV mildly enlarged, mildly elevated PASP, moderate TR.  His enalapril and eplerenone were held due to hypotension.  He was noted to have a small subdural hematoma and neurosurgery recommended to hold anticoagulation for 2 weeks.  Discussion was had to hold off on cardiac catheterization until 2 weeks recovery from subdural hematoma.  Seen by Dr. Caryl Comes 06/03/2020.  He was started on Entresto 24-26 mg twice daily.  He was also given Rx for meclizine for dizziness.  He had CT head 06/23/2020 which showed resolution of previous subdural hemorrhage.  He is following with ENT for dizziness. He had PT today for vertigo.   Presents today for follow up after initiation of Entresto. Reports some improvement in his dyspnea. Reports Meclizine was not helpful for dizziness. Endorses some occasional PND. He has been wearing his CPAP. No orthopnea. On review of medications he has been taking both Coreg and Toprol which could be contributory to dizziness.  Reports no chest pain, pressure, or tightness. No edema, orthopnea. Reports no palpitations.   EKGs/Labs/Other Studies Reviewed:   The following studies were reviewed today:  EKG:  EKG is ordered today.  The ekg ordered today demonstrates rate controlled atrial fibrillation 85 bpm with no acute ST/T wave changes.  Recent Labs: 05/27/2020: TSH 1.691 05/28/2020: Hemoglobin 16.0; Magnesium 2.0; Platelets 208 07/15/2020: BUN 29; Creatinine, Ser 1.59; Potassium 4.8; Sodium 139  Recent Lipid Panel No results found for:  CHOL, TRIG, HDL, CHOLHDL, VLDL, LDLCALC, LDLDIRECT  Home Medications   Current Meds  Medication Sig  . Cinnamon 500 MG TABS Take 1,000 mg by mouth 2 (two) times daily.   . Coenzyme Q10 (CO Q 10 PO) Take 300 mg by mouth daily.  . diphenhydramine-acetaminophen (TYLENOL PM) 25-500 MG TABS tablet Take 2 tablets by mouth at bedtime as needed.  [name] eplerenone (INSPRA) 25 MG tablet TAKE ONE-HALF TABLET BY MOUTH EVERY DAY  . fluticasone (FLONASE) 50 MCG/ACT nasal spray Place into the nose daily.  . folic acid (FOLVITE) 1 MG tablet Take 1 mg by mouth daily.   . furosemide (LASIX) 20 MG tablet TAKE ONE TABLET EVERY DAY  . Garlic 6226 MG CAPS Take 1 capsule by mouth daily.   . Glucosamine-Chondroit-Vit C-Mn (GLUCOSAMINE CHONDR 1500 COMPLX PO) Take 1,500 mg by mouth 2 (two) times daily.   . insulin NPH Human (HUMULIN N,NOVOLIN N) 100 UNIT/ML injection Inject 95 Units into the skin at bedtime.   . insulin regular (NOVOLIN R,HUMULIN  R) 100 units/mL injection Inject 30 Units into the skin 3 (three) times daily before meals.   [name] ipratropium (ATROVENT) 0.03 % nasal spray SMARTSIG:2 Spray(s) Both Nares 3 Times Daily PRN  . levothyroxine (SYNTHROID, LEVOTHROID) 50 MCG tablet Take 50 mcg by mouth daily before breakfast.   . metFORMIN (GLUCOPHAGE) 500 MG tablet Take 500 mg tablet in the am with 0.5 tablet in the pm.  . methotrexate (RHEUMATREX) 2.5 MG tablet as directed. Take 5 (12.5 mg) tablets by mouth every 7 days. Caution:Chemotherapy. Protect from light.  . metoprolol succinate (TOPROL-XL) 25 MG 24 hr tablet Take 1 tablet (25 mg total) by mouth every evening.  . Omega-3 Fatty Acids (FISH OIL PO) Take by mouth 2 (two) times daily.   . rivaroxaban (XARELTO) 20 MG TABS tablet Take 1 tablet by mouth daily with breakfast.  . rosuvastatin (CRESTOR) 10 MG tablet Take 1 tablet by mouth daily.  . rosuvastatin (CRESTOR) 5 MG tablet Take 2 tablets (10mg ) daily alternating 1 tablet (5 mg) daily  . sacubitril-valsartan  (ENTRESTO) 24-26 MG Take 1 tablet by mouth 2 (two) times daily.  . [DISCONTINUED] carvedilol (COREG) 6.25 MG tablet Take 1 tablet by mouth 2 (two) times daily with a meal.      Review of Systems       Review of Systems  Constitutional: Negative for chills, fever and malaise/fatigue.  Cardiovascular: Positive for dyspnea on exertion. Negative for chest pain, irregular heartbeat, leg swelling, near-syncope, orthopnea, palpitations and syncope.  Respiratory: Negative for cough, shortness of breath and wheezing.   Gastrointestinal: Negative for melena, nausea and vomiting.  Genitourinary: Negative for hematuria.  Neurological: Positive for dizziness and light-headedness. Negative for weakness.   All other systems reviewed and are otherwise negative except as noted above.  Physical Exam    VS:  BP 114/62 (BP Location: Left Arm, Patient Position: Sitting, Cuff Size: Normal)   Pulse 85   Ht 5\' 10"  (1.778 m)   Wt 214 lb 9.6 oz (97.3 kg)   SpO2 96%   BMI 30.79 kg/m  , BMI Body mass index is 30.79 kg/m. GEN: Well nourished, well developed, in no acute distress. HEENT: normal. Neck: Supple, no JVD, carotid bruits, or masses. Cardiac: irregularly irregular, no murmurs, rubs, or gallops. No clubbing, cyanosis, edema.  Radials/DP/PT 2+ and equal bilaterally.  Respiratory:  Respirations regular and unlabored, clear to auscultation bilaterally. GI: Soft, nontender, non-distended. MS: No deformity or atrophy. Skin: Warm and dry, no rash. Neuro:  Strength and sensation are intact. Psych: Normal affect.  Assessment & Plan    1. HFrEF/Cardiomyopathy - Euvolemic and well compensated on exam. Echo 05/27/20 with EF20-25%, previously 30-35%. Some improvement in dyspnea since addition of Entresto. GDMT includes eplerenone, lasix, metoprolol, entresto. Unable to further up titrate due to low normal BP and dizziness. Recent hospital admission records recommend cardiac cath, but tells me Dr. Caryl Comes told  him it was not needed at this time - will discuss with [name] and Dr. Caryl Comes to confirm.  2. Atrial fibrillation/VT/Syncope - Continue to follow with EP. No driving for 6 months with start date of 05/26/20.   3. HLD - Continue Crestor alternating 5mg  and 10mg  due to myalgias on higher doses. Tolerating well.  4. Chronic anticoagulation - Due to atrial fib. Denies bleeding complications. Continue Xarelto 20mg  daily.   5. Dizziness - Presently undergoing vertigo PT. Reports no change in dizziness on Meclizine. Noted to be taking Metoprolol and Carvedilol, discontinued Carvedilol, likely contributory to  dizziness.   Disposition: Follow up in October with Dr. Caryl Comes as previously scheduled and in 4 months with [name].    Loel Dubonnet, NP 07/16/2020, 7:37 AM

## 2020-07-15 ENCOUNTER — Other Ambulatory Visit: Payer: Self-pay

## 2020-07-15 ENCOUNTER — Encounter: Payer: Self-pay | Admitting: Family

## 2020-07-15 ENCOUNTER — Ambulatory Visit: Payer: Medicare Other | Admitting: Family

## 2020-07-15 VITALS — BP 114/62 | HR 85 | Ht 70.0 in | Wt 214.6 lb

## 2020-07-15 DIAGNOSIS — I255 Ischemic cardiomyopathy: Secondary | ICD-10-CM

## 2020-07-15 DIAGNOSIS — I472 Ventricular tachycardia, unspecified: Secondary | ICD-10-CM

## 2020-07-15 DIAGNOSIS — I4821 Permanent atrial fibrillation: Secondary | ICD-10-CM | POA: Diagnosis not present

## 2020-07-15 DIAGNOSIS — I5022 Chronic systolic (congestive) heart failure: Secondary | ICD-10-CM | POA: Diagnosis not present

## 2020-07-15 DIAGNOSIS — Z7901 Long term (current) use of anticoagulants: Secondary | ICD-10-CM

## 2020-07-15 NOTE — Patient Instructions (Signed)
Medication Instructions:  Your provider has recommended you make the following change in your medication:   STOP Carvedilol (Coreg)  CONTINUE Metoprolol Succinate 25mg  daily  These are both beta blockers and in the same group of medications. Taking two similar medications may be why you are having more difficulty with lightheadedness and dizziness  *If you need a refill on your cardiac medications before your next appointment, please call your pharmacy*   Lab Work: Your provider recommends that you return for lab work today: BMP  If you have labs (blood work) drawn today and your tests are completely normal, you will receive your results only by: [name] MyChart Message (if you have MyChart) OR . A paper copy in the mail If you have any lab test that is abnormal or we need to change your treatment, we will call you to review the results.   Testing/Procedures: Your EKG today shows rate controlled atrial fibrillation   Follow-Up: At [facility], you and your health needs are our priority.  As part of our continuing mission to provide you with exceptional heart care, we have created designated Provider Care Teams.  These Care Teams include your primary Cardiologist (physician) and Advanced Practice Providers (APPs -  Physician Assistants and Nurse Practitioners) who all work together to provide you with the care you need, when you need it.  We recommend signing up for the patient portal called "MyChart".  Sign up information is provided on this After Visit Summary.  MyChart is used to connect with patients for Virtual Visits (Telemedicine).  Patients are able to view lab/test results, encounter notes, upcoming appointments, etc.  Non-urgent messages can be sent to your provider as well.   To learn more about what you can do with MyChart, go to [email or web address].    Your next appointment:   In October with Dr. Caryl Comes  In 4 months with [name]

## 2020-07-16 ENCOUNTER — Telehealth: Payer: Self-pay

## 2020-07-16 DIAGNOSIS — I5022 Chronic systolic (congestive) heart failure: Secondary | ICD-10-CM

## 2020-07-16 LAB — BASIC METABOLIC PANEL
BUN/Creatinine Ratio: 18 (ref 10–24)
BUN: 29 mg/dL — ABNORMAL HIGH (ref 8–27)
CO2: 19 mmol/L — ABNORMAL LOW (ref 20–29)
Calcium: 9.3 mg/dL (ref 8.6–10.2)
Chloride: 102 mmol/L (ref 96–106)
Creatinine, Ser: 1.59 mg/dL — ABNORMAL HIGH (ref 0.76–1.27)
GFR calc Af Amer: 47 mL/min/{1.73_m2} — ABNORMAL LOW (ref >59)
GFR calc non Af Amer: 41 mL/min/{1.73_m2} — ABNORMAL LOW (ref >59)
Glucose: 109 mg/dL — ABNORMAL HIGH (ref 65–99)
Potassium: 4.8 mmol/L (ref 3.5–5.2)
Sodium: 139 mmol/L (ref 134–144)

## 2020-07-16 MED ORDER — FUROSEMIDE 20 MG PO TABS: 20.0000 mg | ORAL_TABLET | ORAL | Status: DC

## 2020-07-16 NOTE — Telephone Encounter (Signed)
Patient made aware of lab results and Laurann Montana, NP recommendation. HOLD Lasix x 2 days  RESUME Lasix 20 mg every other day. [facility].  Patient verbalized understanding and voiced appreciation for the call.

## 2020-07-16 NOTE — Telephone Encounter (Signed)
-----   Message from Loel Dubonnet, NP sent at 07/16/2020 12:00 PM EDT ----- Normal potassium. Kidney function shows mild decline compared to most recent. Likely due to recent addition of Entresto. Recommend he hold Lasix x2 days then resume at 20mg  every other day. Often we can reduce diuretic dose when starting Entresto. Call office for weight gain of 2lb overnight or 5lb in 1 week. Recommend repeat BMP in 1 week.

## 2020-07-22 ENCOUNTER — Telehealth: Payer: Self-pay | Admitting: Family

## 2020-07-22 NOTE — Telephone Encounter (Signed)
Called to check in on his symptoms of lightheadedness and dizziness.  At last clinic visit he was noted to be taking both metoprolol and carvedilol which was thought to be contributory.  Tells me since stopping the carvedilol and continuing his metoprolol as recommended his dizziness and lightheadedness have improved.  He has no questions or concerns at this time.  Loel Dubonnet, NP

## 2020-07-23 ENCOUNTER — Other Ambulatory Visit
Admission: RE | Admit: 2020-07-23 | Discharge: 2020-07-23 | Disposition: A | Discharge status: Home / Self Care | Payer: Medicare Other | Attending: Family | Admitting: Family

## 2020-07-23 ENCOUNTER — Other Ambulatory Visit: Payer: Self-pay

## 2020-07-23 DIAGNOSIS — I5022 Chronic systolic (congestive) heart failure: Secondary | ICD-10-CM | POA: Insufficient documentation

## 2020-07-23 LAB — BASIC METABOLIC PANEL
Anion gap: 10 (ref 5–15)
BUN: 24 mg/dL — ABNORMAL HIGH (ref 8–23)
CO2: 25 mmol/L (ref 22–32)
Calcium: 8.9 mg/dL (ref 8.9–10.3)
Chloride: 102 mmol/L (ref 98–111)
Creatinine, Ser: 1.47 mg/dL — ABNORMAL HIGH (ref 0.61–1.24)
GFR calc Af Amer: 52 mL/min — ABNORMAL LOW (ref >60)
GFR calc non Af Amer: 45 mL/min — ABNORMAL LOW (ref >60)
Glucose, Bld: 234 mg/dL — ABNORMAL HIGH (ref 70–99)
Potassium: 4.8 mmol/L (ref 3.5–5.1)
Sodium: 137 mmol/L (ref 135–145)

## 2020-07-30 ENCOUNTER — Telehealth: Payer: Self-pay | Admitting: *Deleted

## 2020-07-30 NOTE — Telephone Encounter (Signed)
LMOM requesting call back to DC. Direct number and office hours provided.  ICD alert received for 32 NST and 3 VT-1 events on 07/29/20, EGMs appear AF w/ RVR 150-170's. Known PAF, on Xarelto. Presenting rhythm from 07/30/20 at 0:200 shows irregular VS 50s-70s. V rates overall well controlled. Next f/u with Dr. Caryl Comes on 09/23/20.

## 2020-08-03 ENCOUNTER — Ambulatory Visit (INDEPENDENT_AMBULATORY_CARE_PROVIDER_SITE_OTHER): Payer: Medicare Other | Admitting: *Deleted

## 2020-08-03 DIAGNOSIS — I255 Ischemic cardiomyopathy: Secondary | ICD-10-CM

## 2020-08-03 DIAGNOSIS — I5022 Chronic systolic (congestive) heart failure: Secondary | ICD-10-CM

## 2020-08-03 LAB — CUP PACEART REMOTE DEVICE CHECK
Battery Remaining Longevity: 79 mo
Battery Remaining Percentage: 78 %
Battery Voltage: 2.98 V
Brady Statistic RV Percent Paced: 4.3 %
Date Time Interrogation Session: 20210905020014
HighPow Impedance: 81 Ohm
HighPow Impedance: 81 Ohm
Implantable Lead Implant Date: 20130403
Implantable Lead Location: 753860
Implantable Pulse Generator Implant Date: 20190422
Lead Channel Impedance Value: 380 Ohm
Lead Channel Pacing Threshold Amplitude: 1 V
Lead Channel Pacing Threshold Pulse Width: 0.5 ms
Lead Channel Sensing Intrinsic Amplitude: 11.7 mV
Lead Channel Setting Pacing Amplitude: 2.5 V
Lead Channel Setting Pacing Pulse Width: 0.5 ms
Lead Channel Setting Sensing Sensitivity: 0.5 mV
Pulse Gen Serial Number: 9811176

## 2020-08-03 NOTE — Telephone Encounter (Signed)
Additional NSVT/VT-1 episodes occurred on 07/31/20, all appear likely AF w/RVR. Pt denies any symptoms. Reports compliance with cardiac meds, including metoprolol and Xarelto. Denies missed doses. Reports he has generally not felt as good on Entresto as he did prior to starting it. Originally reported dizziness, but this has improved.   Discussed briefly with Dr. Caryl Comes. Plan to offer virtual visit on 9/9. Pt accepted appointment for phone call visit on 9/9 at 3:45pm. Requests call to his cell number at [phone]. Aware to check his BP/HR prior to appointment. Denies further questions at this time.

## 2020-08-05 ENCOUNTER — Other Ambulatory Visit: Payer: Self-pay

## 2020-08-05 ENCOUNTER — Telehealth (INDEPENDENT_AMBULATORY_CARE_PROVIDER_SITE_OTHER): Payer: Medicare Other | Admitting: Internal Medicine

## 2020-08-05 ENCOUNTER — Telehealth: Payer: Self-pay

## 2020-08-05 VITALS — BP 95/68 | HR 82 | Ht 70.0 in | Wt 210.0 lb

## 2020-08-05 DIAGNOSIS — Z95810 Presence of automatic (implantable) cardiac defibrillator: Secondary | ICD-10-CM | POA: Diagnosis not present

## 2020-08-05 DIAGNOSIS — I4819 Other persistent atrial fibrillation: Secondary | ICD-10-CM | POA: Diagnosis not present

## 2020-08-05 DIAGNOSIS — I472 Ventricular tachycardia, unspecified: Secondary | ICD-10-CM

## 2020-08-05 DIAGNOSIS — I255 Ischemic cardiomyopathy: Secondary | ICD-10-CM | POA: Diagnosis not present

## 2020-08-05 NOTE — Telephone Encounter (Signed)
  Patient Consent for Virtual Visit         Andres TEO Sr. has provided verbal consent on 08/05/2020 for a virtual visit (video or telephone).   CONSENT FOR VIRTUAL VISIT FOR:  Andres Gandy Sr.  By participating in this virtual visit I agree to the following:  I hereby voluntarily request, consent and authorize [place] and its employed or contracted physicians, physician assistants, nurse practitioners or other licensed health care professionals (the Practitioner), to provide me with telemedicine health care services (the "Services") as deemed necessary by the treating Practitioner. I acknowledge and consent to receive the Services by the Practitioner via telemedicine. I understand that the telemedicine visit will involve communicating with the Practitioner through live audiovisual communication technology and the disclosure of certain medical information by electronic transmission. I acknowledge that I have been given the opportunity to request an in-person assessment or other available alternative prior to the telemedicine visit and am voluntarily participating in the telemedicine visit.  I understand that I have the right to withhold or withdraw my consent to the use of telemedicine in the course of my care at any time, without affecting my right to future care or treatment, and that the Practitioner or I may terminate the telemedicine visit at any time. I understand that I have the right to inspect all information obtained and/or recorded in the course of the telemedicine visit and may receive copies of available information for a reasonable fee.  I understand that some of the potential risks of receiving the Services via telemedicine include:  [name] Delay or interruption in medical evaluation due to technological equipment failure or disruption; . Information transmitted may not be sufficient (e.g. poor resolution of images) to allow for appropriate medical decision making by the  Practitioner; and/or  . In rare instances, security protocols could fail, causing a breach of personal health information.  Furthermore, I acknowledge that it is my responsibility to provide information about my medical history, conditions and care that is complete and accurate to the best of my ability. I acknowledge that Practitioner's advice, recommendations, and/or decision may be based on factors not within their control, such as incomplete or inaccurate data provided by me or distortions of diagnostic images or specimens that may result from electronic transmissions. I understand that the practice of medicine is not an exact science and that Practitioner makes no warranties or guarantees regarding treatment outcomes. I acknowledge that a copy of this consent can be made available to me via my patient portal ([facility]), or I can request a printed copy by calling the office of [facility].    I understand that my insurance will be billed for this visit.   I have read or had this consent read to me. . I understand the contents of this consent, which adequately explains the benefits and risks of the Services being provided via telemedicine.  . I have been provided ample opportunity to ask questions regarding this consent and the Services and have had my questions answered to my satisfaction. . I give my informed consent for the services to be provided through the use of telemedicine in my medical care

## 2020-08-05 NOTE — Progress Notes (Signed)
Electrophysiology TeleHealth Note   Due to national recommendations of social distancing due to COVID 19, an audio/video telehealth visit is felt to be most appropriate for this patient at this time.  See MyChart message from today for the patient's consent to telehealth for [facility].   Date:  08/05/2020   ID:  Andres Gandy Sr., DOB 07-01-1941, MRN [id number]  Location: patient's home  Provider location: [address] [place]  Evaluation Performed: Follow-up visit  PCP:  [name], MD  Cardiologist:     Electrophysiologist:  SK   Chief Complaint:  dizziness  History of Present Illness:    Andres Gandy Sr. is a 79 y.o. male who presents via audio/video conferencing for a telehealth visit today.  Since last being seen in our clinic for ICD implanted for ischemic/nonischemic cardiomyopathy  and inducible ventricular tachycardia -April 2013 catheterization April 2013 demonstrating patent single vein graft to the OM. Bypass and with recent syncopal VT accelerated by ATP and spontaneously terminating and recent initiation of entresto and with permanent atrial fib  the patient reports has some lightheadedness;  No dyspnea,   BP at home   DATE TEST EF   2011  Echo [place]  14 %   12/16 [facility]  15 % SVG-OM T; LAD/RCA non obstru  8/20 Echo  30-35% LAE mod  7/21 Echo  20-25%       Date Cr K Hgb  4/18  1.4 5.2    4/19 0.36 4.2 14.3  5/20 1.3 4.8 (2/20) 15.6  7/21 1.36 4.5 16.0  8/21 1.47 4.8 16    Thromboembolic risk factors ( age  -2, HTN-1, DM-1, Vasc disease -1, CHF-1) for a CHADSVASc Score of >=6    The patient denies symptoms of fevers, chills, cough, or new SOB worrisome for COVID 19.    Past Medical History:  Diagnosis Date  . Arthritis   . Atrial fibrillation, persistent-long-term   . Chronic systolic congestive heart failure ([place])    a. 03/2015 Echo: EF 30-35%, diff HK, mild MR, mod dil LA, mildly dil RA, mild TR.  [name] Coronary artery  disease    a. 1994 s/p CABG x 1 (VG->OM3);  b. 10/2015 Cath: LM nl, LAD min irregs, D1 nl, D2 min irregs, LCX 30m, OM2 nl, OM3 90 (small territory), VG->OM3 100, RCA min irregs, RPDA/RPL1/RPL2/RPL3 nl, EF 25%-->Med Rx.  [name] Hx of squamous cell carcinoma of skin 06/04/2013   L upper arm  . Hyperlipidemia   . ICD (implantable cardiac defibrillator) in place    a. 02/2012 s/p SJM [id number] Fortify Assurance single lead AICD.  [name] Mixed Ischemic and Non-ischemic Cardiomyopathy    a. 07/2012 s/p SJM AICD (RV lead only);  b. 03/2015 Echo: EF 30-35%, diff HK;  c. EF 25% by LV gram.  . Nephrolithiasis 1970's  . Polymyalgia rheumatica (HCC)    On steroids  . Sleep apnea    On CPAP  . Type II diabetes mellitus ([place])   . Ventricular tachycardia -inducible at EP testing    a. 07/2012 s/ SJM AICD.    Past Surgical History:  Procedure Laterality Date  . basel cell removal     left arm  . CARDIAC CATHETERIZATION  02/28/2012   Minor disease in LAD and RCA, LCX: 70% mid and occluded distally. Patent SVG to OM3  . CARDIAC CATHETERIZATION N/A 11/08/2015   Procedure: Left Heart Cath and Cors/Grafts Angiography;  Surgeon: [name], MD;  Location: [facility];  Service: Cardiovascular;  Laterality: N/A;  . CARDIOVERSION  03/01/2012   Procedure: CARDIOVERSION;  Surgeon: Deboraha Sprang, MD;  Location: [facility];  Service: Cardiovascular;  Laterality: N/A;  . CATARACT EXTRACTION W/ INTRAOCULAR LENS  IMPLANT, BILATERAL  2011  . CORONARY ARTERY BYPASS GRAFT  1994   [place] ; CABG X1  . ELECTROPHYSIOLOGY STUDY N/A 02/28/2012   Procedure: ELECTROPHYSIOLOGY STUDY;  Surgeon: Deboraha Sprang, MD;  Location: [facility] CATH LAB;  Service: Cardiovascular;  Laterality: N/A;  . ICD GENERATOR CHANGEOUT N/A 03/18/2018   Procedure: [place];  Surgeon: Evans Lance, MD;  Location: [facility];  Service: Cardiovascular;  Laterality: N/A;  . INSERT / REPLACE / REMOVE PACEMAKER  02/28/12   "w/defibrillator"  .  JOINT REPLACEMENT    . KNEE ARTHROSCOPY  2012   right  . LEFT HEART CATHETERIZATION WITH CORONARY/GRAFT ANGIOGRAM N/A 02/28/2012   Procedure: LEFT HEART CATHETERIZATION WITH [name];  Surgeon: [name], MD;  Location: [facility] CATH [facility];  Service: Cardiovascular;  Laterality: N/A;  . TOTAL KNEE ARTHROPLASTY  2011   left    Current Outpatient Medications  Medication Sig Dispense Refill  . Cinnamon 500 MG TABS Take 1,000 mg by mouth 2 (two) times daily.     . Coenzyme Q10 (CO Q 10 PO) Take 300 mg by mouth daily.    . diphenhydramine-acetaminophen (TYLENOL PM) 25-500 MG TABS tablet Take 2 tablets by mouth at bedtime as needed.    [name] eplerenone (INSPRA) 25 MG tablet TAKE ONE-HALF TABLET BY MOUTH EVERY DAY 45 tablet 2  . fluticasone (FLONASE) 50 MCG/ACT nasal spray Place into the nose daily.    . folic acid (FOLVITE) 1 MG tablet Take 1 mg by mouth daily.     . furosemide (LASIX) 20 MG tablet Take 1 tablet (20 mg total) by mouth every other day.    . Garlic 1478 MG CAPS Take 1 capsule by mouth daily.     . Glucosamine-Chondroit-Vit C-Mn (GLUCOSAMINE CHONDR 1500 COMPLX PO) Take 1,500 mg by mouth 2 (two) times daily.     . insulin NPH Human (HUMULIN N,NOVOLIN N) 100 UNIT/ML injection Inject 95 Units into the skin at bedtime.     . insulin regular (NOVOLIN R,HUMULIN R) 100 units/mL injection Inject 30 Units into the skin 3 (three) times daily before meals.     [name] ipratropium (ATROVENT) 0.03 % nasal spray SMARTSIG:2 Spray(s) Both Nares 3 Times Daily PRN    . levothyroxine (SYNTHROID, LEVOTHROID) 50 MCG tablet Take 50 mcg by mouth daily before breakfast.     . metFORMIN (GLUCOPHAGE) 500 MG tablet Take 500 mg tablet in the am with 0.5 tablet in the pm.    . methotrexate (RHEUMATREX) 2.5 MG tablet as directed. Take 5 (12.5 mg) tablets by mouth every 7 days. Caution:Chemotherapy. Protect from light.    . metoprolol succinate (TOPROL-XL) 25 MG 24 hr tablet Take 1 tablet (25 mg total) by  mouth every evening. 60 tablet 0  . Omega-3 Fatty Acids (FISH OIL PO) Take by mouth 2 (two) times daily.     . rivaroxaban (XARELTO) 20 MG TABS tablet Take 1 tablet by mouth daily with breakfast.    . rosuvastatin (CRESTOR) 10 MG tablet Take 1 tablet by mouth daily.    . rosuvastatin (CRESTOR) 5 MG tablet Take 2 tablets (10mg ) daily alternating 1 tablet (5 mg) daily 45 tablet 11  . sacubitril-valsartan (ENTRESTO) 24-26 MG Take  1 tablet by mouth 2 (two) times daily. 60 tablet 6   No current facility-administered medications for this visit.    Allergies:   Patient has no known allergies.   Social History:  The patient  reports that he quit smoking about 32 years ago. His smoking use included cigarettes. He has a 25.00 pack-year smoking history. He has never used smokeless tobacco. He reports that he does not drink alcohol and does not use drugs.   Family History:  The patient's   family history includes Heart attack in his brother and father.   ROS:  Please see the history of present illness.   All other systems are personally reviewed and negative.    Exam:    Vital Signs:  BP 95/68   Pulse 82   Ht 5\' 10"  (1.778 m)   Wt 210 lb (95.3 kg)   BMI 30.13 kg/m      Labs/Other Tests and Data Reviewed:    Recent Labs: 05/27/2020: TSH 1.691 05/28/2020: Hemoglobin 16.0; Magnesium 2.0; Platelets 208 07/23/2020: BUN 24; Creatinine, Ser 1.47; Potassium 4.8; Sodium 137   Wt Readings from Last 3 Encounters:  08/05/20 210 lb (95.3 kg)  07/15/20 214 lb 9.6 oz (97.3 kg)  06/03/20 211 lb (95.7 kg)     Other studies personally reviewed: Additional studies/ records that were reviewed today include: As above   Device interroagation 9/21 normal device function with intercurrent episodes of rapid Afib falling into monitoring zone   ASSESSMENT & PLAN:    Congestive heart failure-chronic-systolic  Atrial fibrillation-permanent  Cardiomyopathy ischemic/nonischemic  Orthostatic  intolerance-improved  High Risk Medication Surveillance epleronone   Ventricular tachycardia -monomorphic-rapid-syncopal  ICD single chamber-[employer]    Rapid Afib encroaching on VT zone.    BP precludes BB uptitration so will recheck AFib burden and then decide initiate digoxin, 0.125 mg daily with dig level in 2 weeks   Not sure why there has been an increase in AF RVR, TSH, Hgb within nl range; ischemia would be an unlikely trigger, but CHF could be,  Dig might be helpful in this regard also   Without symptoms of ischemia  No intercurrent Ventricular tachycardia  Volume status euvolemic by history    COVID 19 screen The patient denies symptoms of COVID 19 at this time.  The importance of social distancing was discussed today.  Follow-up: 22m Remote ASAP    Current medicines are reviewed at length with the patient today.     Labs/ tests ordered today include:  No orders of the defined types were placed in this encounter.   Future tests ( post COVID )     Patient Risk:  after full review of this patients clinical status, I feel that they are at moderate  risk at this time.  Today, I have spent 10 * minutes with the patient with telehealth technology discussing the above.  Signed, Virl Axe, MD  08/05/2020 4:18 PM     [address] [place] [address] [phone] (office) [phone] (fax)

## 2020-08-05 NOTE — Progress Notes (Signed)
Remote ICD transmission.   

## 2020-08-11 ENCOUNTER — Telehealth: Payer: Self-pay

## 2020-08-11 DIAGNOSIS — I472 Ventricular tachycardia, unspecified: Secondary | ICD-10-CM

## 2020-08-11 DIAGNOSIS — I5022 Chronic systolic (congestive) heart failure: Secondary | ICD-10-CM

## 2020-08-11 NOTE — Telephone Encounter (Signed)
I called Andres [name] asked him to send a manual transmission per Dr. Caryl Comes request. Dr. Caryl Comes wanted to see if he had any A-Fib. The Andres [name] agreed to send one today.

## 2020-08-11 NOTE — Telephone Encounter (Signed)
Transmission from 08/11/20 received. Some episodes of NSVT are irregular, may be AF with RVR.

## 2020-08-11 NOTE — Telephone Encounter (Signed)
Transmission received 08-11-2020

## 2020-08-12 MED ORDER — DIGOXIN 125 MCG PO TABS
0.1250 mg | ORAL_TABLET | Freq: Every day | ORAL | 3 refills | Status: DC
Start: 2020-08-12 — End: 2020-12-28

## 2020-08-12 NOTE — Patient Instructions (Signed)
Medication Instructions:  Your physician recommends that you continue on your current medications as directed. Please refer to the Current Medication list given to you today.  *If you need a refill on your cardiac medications before your next appointment, please call your pharmacy*   Lab Work: None ordered.  If you have labs (blood work) drawn today and your tests are completely normal, you will receive your results only by: [name] MyChart Message (if you have MyChart) OR . A paper copy in the mail If you have any lab test that is abnormal or we need to change your treatment, we will call you to review the results.   Testing/Procedures: None ordered.    Follow-Up: At [facility], you and your health needs are our priority.  As part of our continuing mission to provide you with exceptional heart care, we have created designated Provider Care Teams.  These Care Teams include your primary Cardiologist (physician) and Advanced Practice Providers (APPs -  Physician Assistants and Nurse Practitioners) who all work together to provide you with the care you need, when you need it.  We recommend signing up for the patient portal called "MyChart".  Sign up information is provided on this After Visit Summary.  MyChart is used to connect with patients for Virtual Visits (Telemedicine).  Patients are able to view lab/test results, encounter notes, upcoming appointments, etc.  Non-urgent messages can be sent to your provider as well.   To learn more about what you can do with MyChart, go to [email or web address].    Your next appointment:   3 month(s)  The format for your next appointment:   In Person  Provider:   Virl Axe, MD

## 2020-08-12 NOTE — Telephone Encounter (Signed)
Spoke with pt and advised per Dr Caryl Comes to begin Digoxin 0.125mg  - 1 tablet by mouth daily.  Labs (BMET and Dig level) in 2 weeks.  Pt would like to have labs drawn at [facility].  Pt verbalized understanding and agrees with current plan.

## 2020-08-12 NOTE — Telephone Encounter (Signed)
M can we begin him on dig 0.125 mg daily with dig level/BMET in 2 weeks please

## 2020-08-18 ENCOUNTER — Ambulatory Visit: Payer: Medicare Other | Admitting: Physician Assistant

## 2020-08-23 ENCOUNTER — Ambulatory Visit (INDEPENDENT_AMBULATORY_CARE_PROVIDER_SITE_OTHER): Payer: Medicare Other | Admitting: Emergency Medicine

## 2020-08-23 DIAGNOSIS — I255 Ischemic cardiomyopathy: Secondary | ICD-10-CM

## 2020-08-23 LAB — CUP PACEART REMOTE DEVICE CHECK
Battery Remaining Longevity: 79 mo
Battery Remaining Percentage: 78 %
Battery Voltage: 2.96 V
Brady Statistic RV Percent Paced: 4.1 %
Date Time Interrogation Session: 20210926020015
HighPow Impedance: 82 Ohm
HighPow Impedance: 82 Ohm
Implantable Lead Implant Date: 20130403
Implantable Lead Location: 753860
Implantable Pulse Generator Implant Date: 20190422
Lead Channel Impedance Value: 440 Ohm
Lead Channel Pacing Threshold Amplitude: 1 V
Lead Channel Pacing Threshold Pulse Width: 0.5 ms
Lead Channel Sensing Intrinsic Amplitude: 11.7 mV
Lead Channel Setting Pacing Amplitude: 2.5 V
Lead Channel Setting Pacing Pulse Width: 0.5 ms
Lead Channel Setting Sensing Sensitivity: 0.5 mV
Pulse Gen Serial Number: 9811176

## 2020-08-26 NOTE — Progress Notes (Signed)
Remote ICD transmission.   

## 2020-08-27 ENCOUNTER — Other Ambulatory Visit: Payer: Self-pay

## 2020-08-27 ENCOUNTER — Other Ambulatory Visit (INDEPENDENT_AMBULATORY_CARE_PROVIDER_SITE_OTHER): Payer: Medicare Other

## 2020-08-27 ENCOUNTER — Other Ambulatory Visit: Payer: Medicare Other

## 2020-08-27 ENCOUNTER — Ambulatory Visit: Payer: Medicare Other

## 2020-08-27 DIAGNOSIS — I4819 Other persistent atrial fibrillation: Secondary | ICD-10-CM

## 2020-08-27 DIAGNOSIS — I4729 Other ventricular tachycardia: Secondary | ICD-10-CM

## 2020-08-27 DIAGNOSIS — Z79899 Other long term (current) drug therapy: Secondary | ICD-10-CM

## 2020-09-01 ENCOUNTER — Telehealth: Payer: Self-pay | Admitting: Internal Medicine

## 2020-09-01 DIAGNOSIS — Z79899 Other long term (current) drug therapy: Secondary | ICD-10-CM

## 2020-09-01 DIAGNOSIS — I4819 Other persistent atrial fibrillation: Secondary | ICD-10-CM

## 2020-09-01 LAB — BASIC METABOLIC PANEL
BUN/Creatinine Ratio: 19 (ref 10–24)
BUN: 26 mg/dL (ref 8–27)
CO2: 17 mmol/L — ABNORMAL LOW (ref 20–29)
Calcium: 9.8 mg/dL (ref 8.6–10.2)
Chloride: 102 mmol/L (ref 96–106)
Creatinine, Ser: 1.37 mg/dL — ABNORMAL HIGH (ref 0.76–1.27)
GFR calc Af Amer: 56 mL/min/{1.73_m2} — ABNORMAL LOW (ref >59)
GFR calc non Af Amer: 49 mL/min/{1.73_m2} — ABNORMAL LOW (ref >59)
Glucose: 291 mg/dL — ABNORMAL HIGH (ref 65–99)
Potassium: 5 mmol/L (ref 3.5–5.2)
Sodium: 138 mmol/L (ref 134–144)

## 2020-09-01 LAB — DIGOXIN LEVEL

## 2020-09-01 NOTE — Telephone Encounter (Signed)
I spoke with the patient.  I advised him of his BMP results, but also advised that his Digoxin level did not result due to an insufficient quantity for the specimen. I have asked him if he would be willing to come back for a re-draw of this lab at the [facility] since digoxin was a new start medication for him.   The patient voices understanding and is agreeable.

## 2020-09-01 NOTE — Telephone Encounter (Signed)
Andres Sprang, MD  08/28/2020 5:54 PM EDT     Please Inform Patient  Labs are normal x abnomral renal function but back to baseline   Thanks

## 2020-09-03 ENCOUNTER — Other Ambulatory Visit: Payer: Self-pay | Admitting: *Deleted

## 2020-09-03 ENCOUNTER — Other Ambulatory Visit
Admission: RE | Admit: 2020-09-03 | Discharge: 2020-09-03 | Disposition: A | Discharge status: Home / Self Care | Payer: Medicare Other | Attending: Internal Medicine | Admitting: Internal Medicine

## 2020-09-03 DIAGNOSIS — I4819 Other persistent atrial fibrillation: Secondary | ICD-10-CM

## 2020-09-03 DIAGNOSIS — Z79899 Other long term (current) drug therapy: Secondary | ICD-10-CM

## 2020-09-03 LAB — DIGOXIN LEVEL: Digoxin Level: 0.6 ng/mL — ABNORMAL LOW (ref 1.0–2.0)

## 2020-09-03 MED ORDER — FUROSEMIDE 20 MG PO TABS: 20.0000 mg | ORAL_TABLET | ORAL | 3 refills | Status: DC

## 2020-09-08 ENCOUNTER — Other Ambulatory Visit: Payer: Self-pay | Admitting: *Deleted

## 2020-09-08 MED ORDER — FUROSEMIDE 20 MG PO TABS
20.0000 mg | ORAL_TABLET | ORAL | 1 refills | Status: DC
Start: 2020-09-08 — End: 2021-08-23

## 2020-09-16 ENCOUNTER — Telehealth: Payer: Self-pay | Admitting: Internal Medicine

## 2020-09-16 NOTE — Telephone Encounter (Signed)
-----   Message from Britt Bottom, [place] sent at 09/15/2020  4:13 PM EDT ----- Pt not due until 10/2020 pt last seen 9/21 told to f/u in 3 months. Please advise if ok to keep appointment or needs to reschedule TY.

## 2020-09-16 NOTE — Telephone Encounter (Signed)
LMOV to discuss if appointment needed with Dr Caryl Comes

## 2020-09-23 ENCOUNTER — Encounter: Payer: Medicare Other | Admitting: Internal Medicine

## 2020-09-27 ENCOUNTER — Other Ambulatory Visit: Payer: Self-pay | Admitting: Internal Medicine

## 2020-09-27 NOTE — Telephone Encounter (Signed)
Rx request sent to pharmacy.  

## 2020-09-30 ENCOUNTER — Other Ambulatory Visit: Payer: Self-pay

## 2020-09-30 ENCOUNTER — Ambulatory Visit (INDEPENDENT_AMBULATORY_CARE_PROVIDER_SITE_OTHER): Payer: Medicare Other | Admitting: Dermatology

## 2020-09-30 DIAGNOSIS — L821 Other seborrheic keratosis: Secondary | ICD-10-CM | POA: Diagnosis not present

## 2020-09-30 DIAGNOSIS — L820 Inflamed seborrheic keratosis: Secondary | ICD-10-CM

## 2020-09-30 DIAGNOSIS — D229 Melanocytic nevi, unspecified: Secondary | ICD-10-CM | POA: Diagnosis not present

## 2020-09-30 DIAGNOSIS — L918 Other hypertrophic disorders of the skin: Secondary | ICD-10-CM

## 2020-09-30 DIAGNOSIS — Z85828 Personal history of other malignant neoplasm of skin: Secondary | ICD-10-CM

## 2020-09-30 DIAGNOSIS — L578 Other skin changes due to chronic exposure to nonionizing radiation: Secondary | ICD-10-CM

## 2020-09-30 DIAGNOSIS — L814 Other melanin hyperpigmentation: Secondary | ICD-10-CM

## 2020-09-30 DIAGNOSIS — L570 Actinic keratosis: Secondary | ICD-10-CM

## 2020-09-30 DIAGNOSIS — Z1283 Encounter for screening for malignant neoplasm of skin: Secondary | ICD-10-CM | POA: Diagnosis not present

## 2020-09-30 DIAGNOSIS — D1800 Hemangioma unspecified site: Secondary | ICD-10-CM

## 2020-09-30 NOTE — Progress Notes (Signed)
   Follow-Up Visit   Subjective  AMI THORNSBERRY Sr. is a 79 y.o. male who presents for the following: Annual Exam (History of BCC/SCC - UBSE today). The patient presents for Upper Body Skin Exam (UBSE) for skin cancer screening and mole check.  The following portions of the chart were reviewed this encounter and updated as appropriate:  Tobacco  Allergies  Meds  Problems  Med Hx  Surg Hx  Fam Hx     Review of Systems:  No other skin or systemic complaints except as noted in HPI or Assessment and Plan.  Objective  Well appearing patient in no apparent distress; mood and affect are within normal limits.  All skin waist up examined.  Objective  left cheek x 1, chest x 4 (5): Erythematous keratotic or waxy stuck-on papule or plaque.   Objective  left forehead x 12, face/ears x 5 (17): Erythematous thin papules/macules with gritty scale.    Assessment & Plan    Lentigines - Scattered tan macules - Discussed due to sun exposure - Benign, observe - Call for any changes  Seborrheic Keratoses - Stuck-on, waxy, tan-brown papules and plaques  - Discussed benign etiology and prognosis. - Observe - Call for any changes  Melanocytic Nevi - Tan-brown and/or pink-flesh-colored symmetric macules and papules - Benign appearing on exam today - Observation - Call clinic for new or changing moles - Recommend daily use of broad spectrum spf 30+ sunscreen to sun-exposed areas.   Hemangiomas - Red papules - Discussed benign nature - Observe - Call for any changes  Actinic Damage - Chronic, secondary to cumulative UV/sun exposure - diffuse scaly erythematous macules with underlying dyspigmentation - Recommend daily broad spectrum sunscreen SPF 30+ to sun-exposed areas, reapply every 2 hours as needed.  - Call for new or changing lesions.  Skin cancer screening performed today.  Acrochordons (Skin Tags) - Fleshy, skin-colored pedunculated papules - Benign appearing.  -  Observe. - If desired, they can be removed with an in office procedure that is not covered by insurance. - Please call the clinic if you notice any new or changing lesions.    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  I, Ashok Cordia, CMA, am acting as scribe for Sarina Ser, MD .  Documentation: I have reviewed the above documentation for accuracy and completeness, and I agree with the above.  Sarina Ser, MD

## 2020-09-30 NOTE — Patient Instructions (Signed)

## 2020-10-01 ENCOUNTER — Encounter: Payer: Self-pay | Admitting: Dermatology

## 2020-11-02 ENCOUNTER — Telehealth: Payer: Self-pay

## 2020-11-02 NOTE — Telephone Encounter (Signed)
[facility]lin alert received 10/30/20 for frequent NSVT/VT-1 (monitor Zone). EGM's appear irregular (possily AF RVR). No EGM's available for NSVT d/t to many episodes for storage.  CorVue impedance monitoring below reference line.   Called patient to assess. States he has been feeling well and has no complaints. Denies any fluid retention in legs or feet. Complaint with all medications including  Lasix 20 mg every other day, Toprol- XL 25 mg daily, xarelto 20 mg daily, Entresto 24-6 mg BID. Advised I will forward to Dr. Caryl Comes for review and we will call with any changes. Advised to call with questions or concerns. Verbalized understanding.

## 2020-11-03 NOTE — Telephone Encounter (Signed)
[name]  thanks  agree  is AF is permanent and we have had some issues in rate control limited by low BP.  Let s find out what his BP is and we will have to increase his metoprolol and based on BP do something else if he develops symptoms, most likely stop entresto and switch ot losartan   Thanks SK

## 2020-11-04 NOTE — Telephone Encounter (Signed)
Calling patient in regards to blood pressure checks at home. States he checks his blood pressure daily and systolic trends run from [phone]. Plus is typically in 60's. Advised we will call with changes in plan. Verbalized understanding.

## 2020-11-09 NOTE — Telephone Encounter (Signed)
Lets increase metoprolol to 25 bid  Thanks SK

## 2020-11-11 DIAGNOSIS — H02132 Senile ectropion of right lower eyelid: Secondary | ICD-10-CM | POA: Insufficient documentation

## 2020-11-11 DIAGNOSIS — H04223 Epiphora due to insufficient drainage, bilateral lacrimal glands: Secondary | ICD-10-CM | POA: Insufficient documentation

## 2020-11-11 DIAGNOSIS — H04552 Acquired stenosis of left nasolacrimal duct: Secondary | ICD-10-CM | POA: Insufficient documentation

## 2020-11-11 NOTE — Telephone Encounter (Signed)
Spoke with pt's wife, DPR and advised per Dr Caryl Comes pt should increase Metoprolol Succinate 25mg  to 1 tablet bid.  Pt's wife reports pt does not have Metoprolol Succinate 25mg  on hand.  According to pt's med list last Rx was sent to pharmacy 05/2020, #60 with no refills.  Will forward to Dr Caryl Comes to notify him pt has apparently not been taking Metoprolol for 3 months now.  Pt's wife verbalizes understanding and will wait for return call from RN for further instructions.

## 2020-11-15 MED ORDER — METOPROLOL SUCCINATE ER 25 MG PO TB24
25.0000 mg | ORAL_TABLET | Freq: Every evening | ORAL | 3 refills | Status: DC
Start: 2020-11-15 — End: 2021-02-01

## 2020-11-15 NOTE — Telephone Encounter (Signed)
Good morning-- lets resume metoprolol at 25 mg daily ( succinate--if he tolerated before ) Thanks SK

## 2020-11-15 NOTE — Telephone Encounter (Signed)
Spoke with pt's wife, DPR and advised per Dr Caryl Comes pt should restart Metoprolol Succinate 25mg , 1 tablet by mouth daily in the evening.  Rx sent to pharmacy on file in [place] as requested.  Pt's wife verbalizes understanding and states she will let pt know.

## 2020-11-16 ENCOUNTER — Telehealth: Payer: Self-pay

## 2020-11-16 DIAGNOSIS — I472 Ventricular tachycardia, unspecified: Secondary | ICD-10-CM

## 2020-11-16 DIAGNOSIS — Z79899 Other long term (current) drug therapy: Secondary | ICD-10-CM

## 2020-11-16 NOTE — Telephone Encounter (Signed)
Merlin alert received for VF episode on 11/15/20 @ 0930, ATP X1 during charge with late break, shock aborted. Numerous NSVT prior and post ATP (EGM not available).   Spoke to Andres [name] ([facility]) states Andres [name] did not feel well yesterday, states he has been over exerting himself cooking BBQ for Christmas. States he cooks it for days and it drains him. Reports today he is feeling much better and has no complaints. Patient started Metoprolol Succinate 25 mg daily in the evening (started yesterday 11/15/20). Advised I will forward to Dr. Caryl Comes and we will call with changes. Discussed importance of compliance with medication.   Shock plan/driving restrictions discussed (6 months).

## 2020-11-22 ENCOUNTER — Ambulatory Visit (INDEPENDENT_AMBULATORY_CARE_PROVIDER_SITE_OTHER): Payer: Medicare Other

## 2020-11-22 DIAGNOSIS — I255 Ischemic cardiomyopathy: Secondary | ICD-10-CM | POA: Diagnosis not present

## 2020-11-22 DIAGNOSIS — I5022 Chronic systolic (congestive) heart failure: Secondary | ICD-10-CM

## 2020-11-23 LAB — CUP PACEART REMOTE DEVICE CHECK
Battery Remaining Longevity: 76 mo
Battery Remaining Percentage: 75 %
Battery Voltage: 2.98 V
Brady Statistic RV Percent Paced: 4.1 %
Date Time Interrogation Session: 20211227020017
HighPow Impedance: 73 Ohm
HighPow Impedance: 73 Ohm
Implantable Lead Implant Date: 20130403
Implantable Lead Location: 753860
Implantable Pulse Generator Implant Date: 20190422
Lead Channel Impedance Value: 380 Ohm
Lead Channel Pacing Threshold Amplitude: 1 V
Lead Channel Pacing Threshold Pulse Width: 0.5 ms
Lead Channel Sensing Intrinsic Amplitude: 11.7 mV
Lead Channel Setting Pacing Amplitude: 2.5 V
Lead Channel Setting Pacing Pulse Width: 0.5 ms
Lead Channel Setting Sensing Sensitivity: 0.5 mV
Pulse Gen Serial Number: 9811176

## 2020-11-23 NOTE — Telephone Encounter (Signed)
[name] We should check Andres [name] and continue metoprolol and check his BP   if higher than 100 sys lets increase metop succ to 25 bids Thanks SK

## 2020-11-24 IMAGING — CT CT HEAD W/O CM
3 series · 15 of 47 positions shown, 18 images · non-contrast
Comparison: Prior head CT since 05/26/2020.

CLINICAL DATA: 78-year-old male with trace para falcine subdural
hematoma after a fall.

EXAM:
CT HEAD WITHOUT CONTRAST
TECHNIQUE: Contiguous axial images were obtained from the base of the skull
through the vertex without intravenous contrast.

[Series 2: head wo · axial · 0.47mm/px · z∈[-118,+7]mm · 9 of 31 slices shown, 12 images]
[im 3/31  brain]
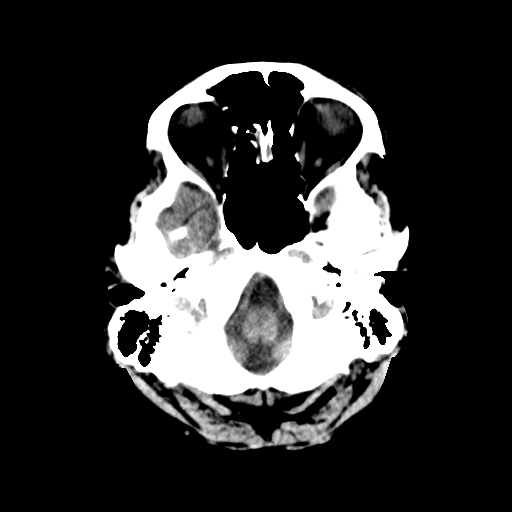
[im 3/31  bone]
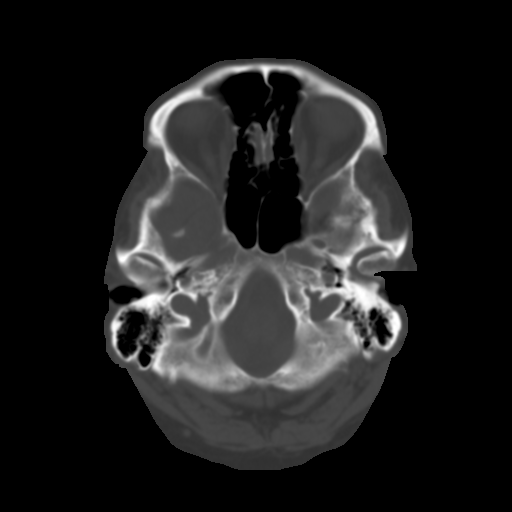
[im 6/31  brain]
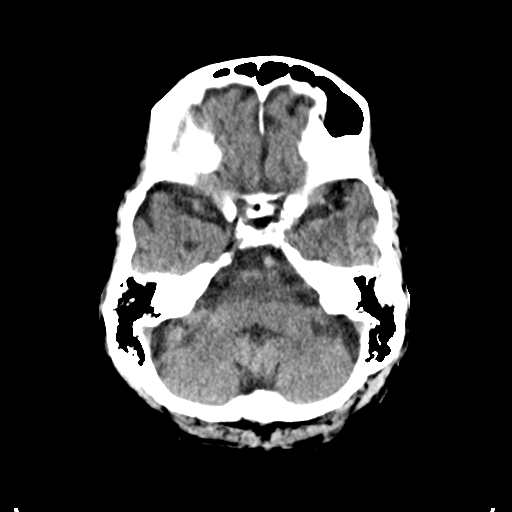
[im 9/31  brain]
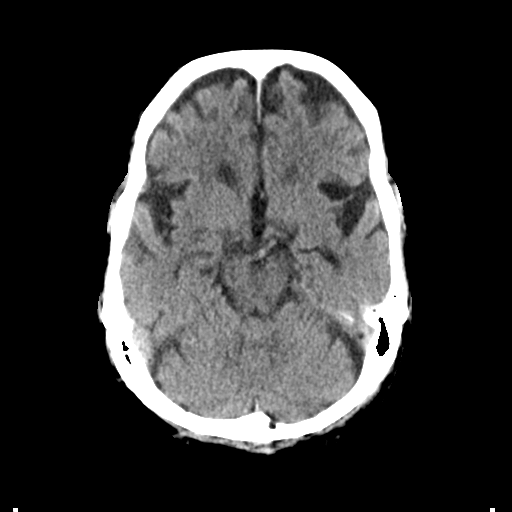
[im 12/31  brain]
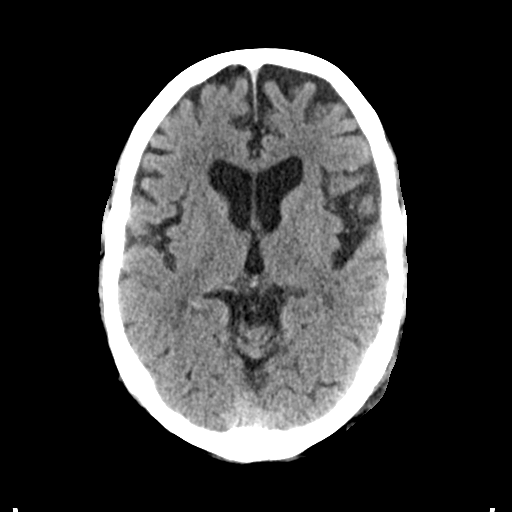
[im 16/31  brain]
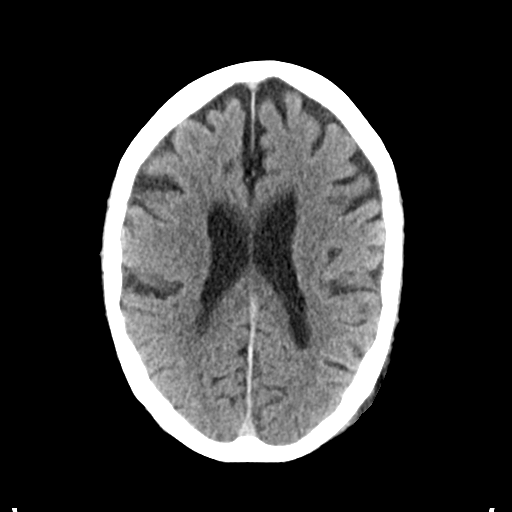
[im 16/31  bone]
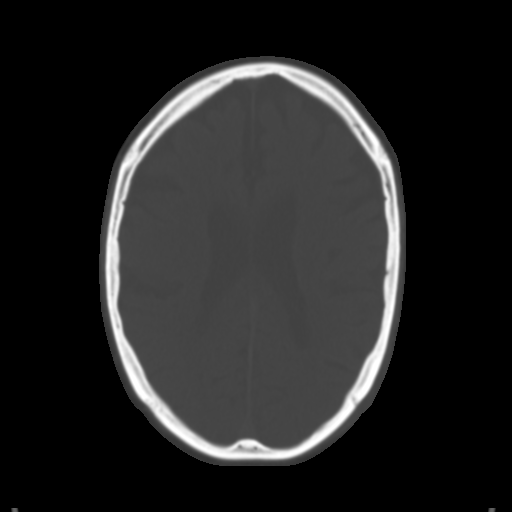
[im 19/31  brain]
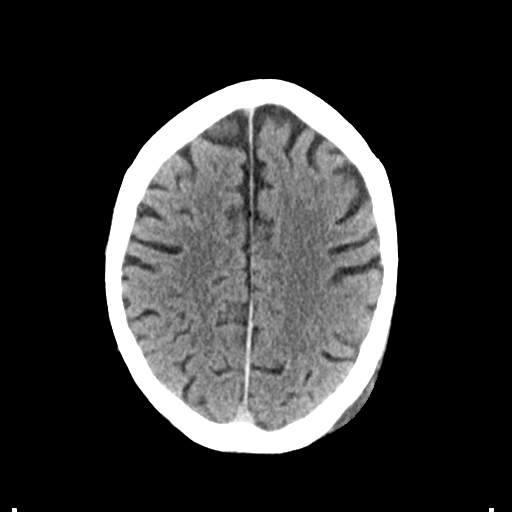
[im 22/31  brain]
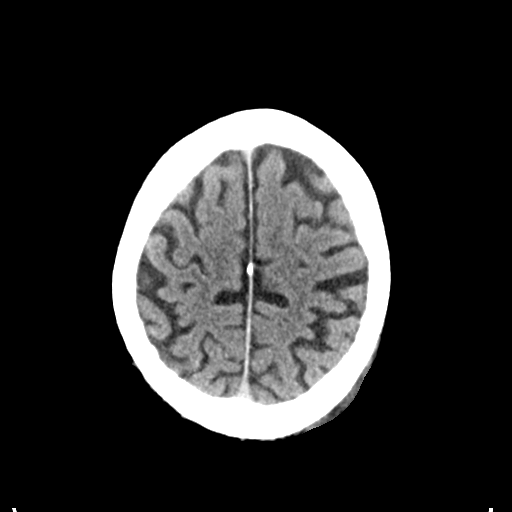
[im 25/31  brain]
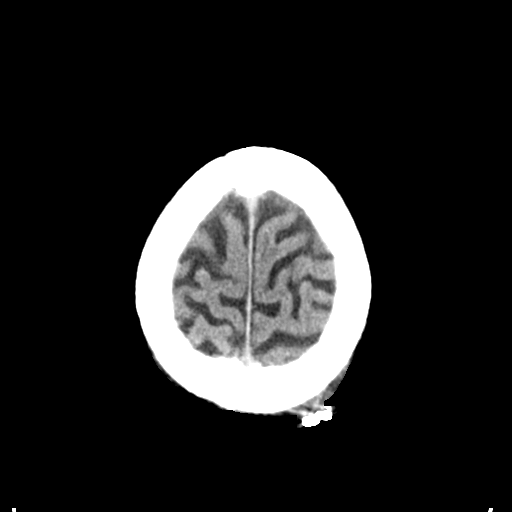
[im 28/31  brain]
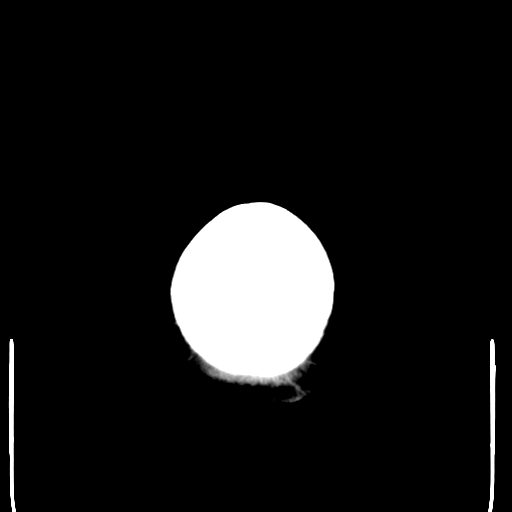
[im 28/31  bone]
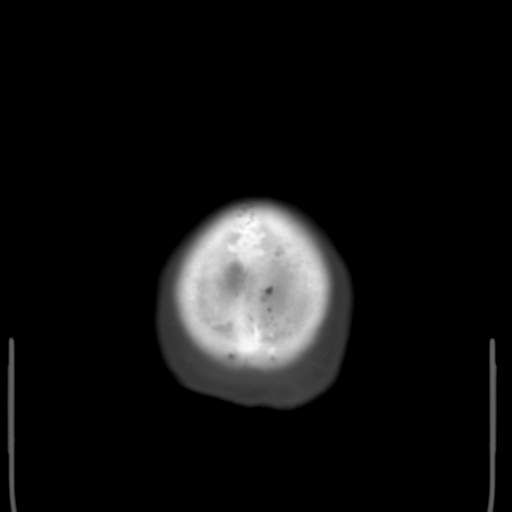

[Series 4: coronal soft tissue · coronal · 0.29mm/px · 3 of 69 slices shown]
[im 23/69  brain]
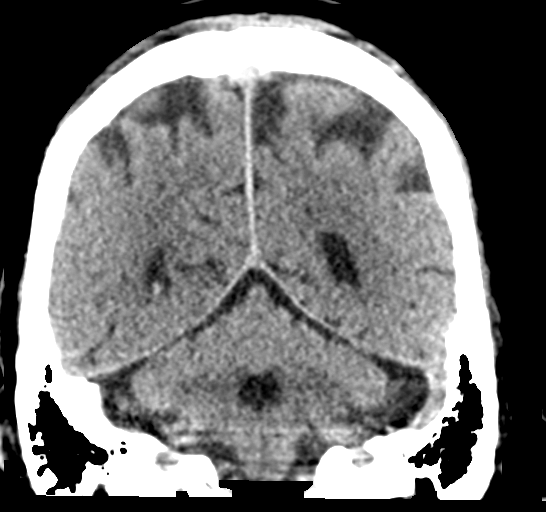
[im 31/69  brain]
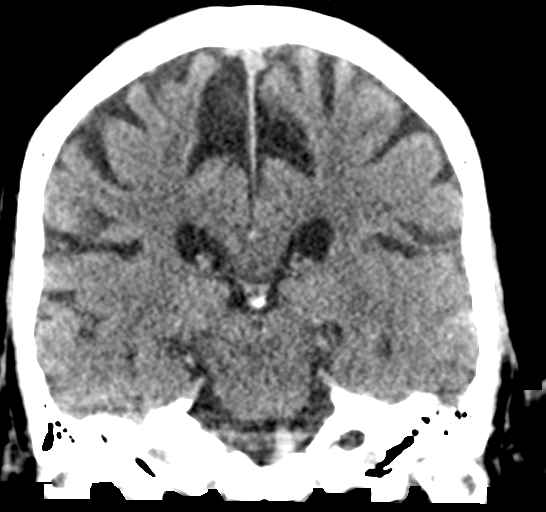
[im 38/69  brain]
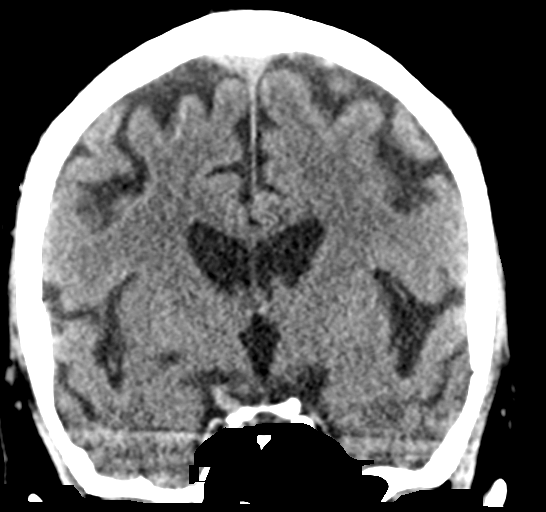

[Series 5: sagittal soft tissue · sagittal · 0.29mm/px · 3 of 54 slices shown]
[im 18/54  brain]
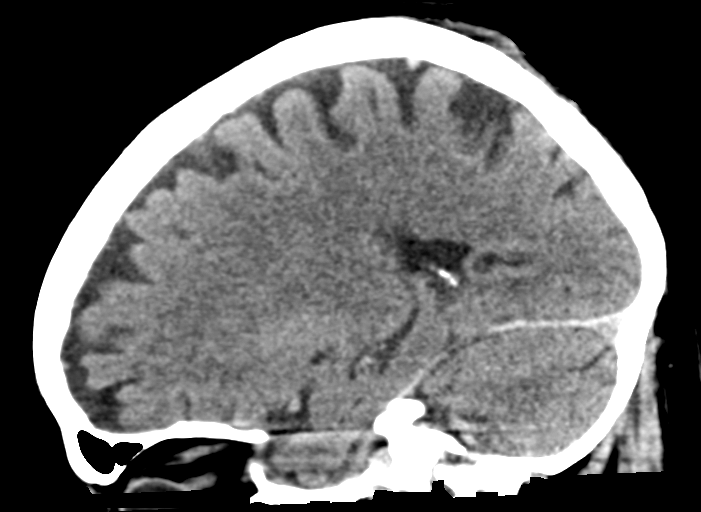
[im 27/54  brain]
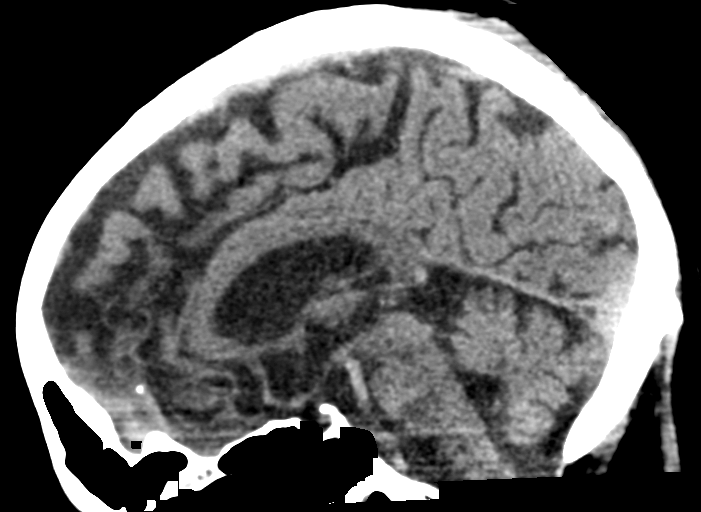
[im 36/54  brain]
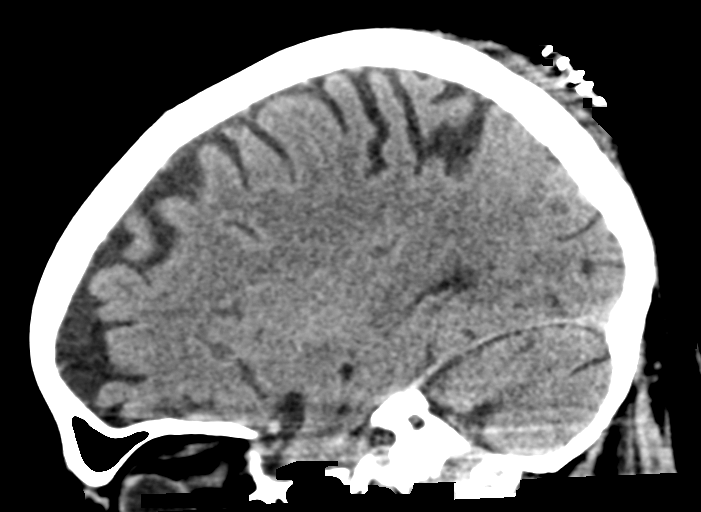

[15 of 47 positions shown; findings below may reference images not displayed]

FINDINGS: Brain: Persistent small or trace volume of paraspinal seen subdural
blood, not significantly changed from 05/26/2020. See series 2,
image 20. As before, no intracranial mass effect.

No new intracranial hemorrhage. Stable gray-white matter
differentiation throughout the brain. No midline shift,
ventriculomegaly, mass effect, evidence of mass lesion, intracranial
hemorrhage or evidence of cortically based acute infarction.

Vascular: Calcified atherosclerosis at the skull base.

Skull: No fracture identified.

Sinuses/Orbits: Visualized paranasal sinuses and mastoids are stable
and well pneumatized.

Other: Broad-based posterior left scalp hematoma has regressed.
Other orbit and scalp soft tissues remain negative.
IMPRESSION: 1. Persistent but stable trace parafalcine subdural blood, not
significantly changed from 05/26/2020. No intracranial mass effect.
2. No new intracranial abnormality.
3. Regressed posterior left scalp hematoma. No underlying skull
fracture.

## 2020-11-24 NOTE — Telephone Encounter (Addendum)
Spoke with pt and advised per Dr Graciela Husbands he would like for pt to have labs drawn and continue Metoprolol.  Pt states he checked his BP this am with reading of 97/66.  Pt would like to monitor his BP at home for the next several days and call with BP readings if needed.  Pt states he will go to the medical mall in [facility] 11/25/2020 for lab work.  Pt verbalizes understanding and agrees with current plan.

## 2020-11-25 ENCOUNTER — Other Ambulatory Visit: Payer: Self-pay

## 2020-11-25 ENCOUNTER — Other Ambulatory Visit
Admission: RE | Admit: 2020-11-25 | Discharge: 2020-11-25 | Disposition: A | Discharge status: Home / Self Care | Payer: Medicare Other | Attending: Internal Medicine | Admitting: Internal Medicine

## 2020-11-25 DIAGNOSIS — I472 Ventricular tachycardia, unspecified: Secondary | ICD-10-CM

## 2020-11-25 DIAGNOSIS — Z79899 Other long term (current) drug therapy: Secondary | ICD-10-CM | POA: Diagnosis present

## 2020-11-25 LAB — BASIC METABOLIC PANEL
Anion gap: 10 (ref 5–15)
BUN: 24 mg/dL — ABNORMAL HIGH (ref 8–23)
CO2: 25 mmol/L (ref 22–32)
Calcium: 9.2 mg/dL (ref 8.9–10.3)
Chloride: 103 mmol/L (ref 98–111)
Creatinine, Ser: 1.47 mg/dL — ABNORMAL HIGH (ref 0.61–1.24)
GFR, Estimated: 48 mL/min — ABNORMAL LOW (ref >60)
Glucose, Bld: 194 mg/dL — ABNORMAL HIGH (ref 70–99)
Potassium: 4.1 mmol/L (ref 3.5–5.1)
Sodium: 138 mmol/L (ref 135–145)

## 2020-11-25 LAB — MAGNESIUM: Magnesium: 1.9 mg/dL (ref 1.7–2.4)

## 2020-12-02 NOTE — Progress Notes (Signed)
Remote ICD transmission.   

## 2020-12-07 ENCOUNTER — Encounter: Payer: Self-pay | Admitting: Cardiovascular Disease

## 2020-12-07 ENCOUNTER — Ambulatory Visit: Payer: Medicare Other | Admitting: Cardiovascular Disease

## 2020-12-07 ENCOUNTER — Other Ambulatory Visit: Payer: Self-pay

## 2020-12-07 VITALS — BP 120/62 | HR 71 | Ht 70.0 in | Wt 213.0 lb

## 2020-12-07 DIAGNOSIS — I2510 Atherosclerotic heart disease of native coronary artery without angina pectoris: Secondary | ICD-10-CM | POA: Diagnosis not present

## 2020-12-07 DIAGNOSIS — E7849 Other hyperlipidemia: Secondary | ICD-10-CM | POA: Diagnosis not present

## 2020-12-07 DIAGNOSIS — I482 Chronic atrial fibrillation, unspecified: Secondary | ICD-10-CM | POA: Diagnosis not present

## 2020-12-07 DIAGNOSIS — I5022 Chronic systolic (congestive) heart failure: Secondary | ICD-10-CM | POA: Diagnosis not present

## 2020-12-07 NOTE — Patient Instructions (Signed)
Medication Instructions:  Your physician recommends that you continue on your current medications as directed. Please refer to the Current Medication list given to you today.  *If you need a refill on your cardiac medications before your next appointment, please call your pharmacy*   Lab Work: None ordered If you have labs (blood work) drawn today and your tests are completely normal, you will receive your results only by: [name] MyChart Message (if you have MyChart) OR . A paper copy in the mail If you have any lab test that is abnormal or we need to change your treatment, we will call you to review the results.   Testing/Procedures: None ordered   Follow-Up: At [facility], you and your health needs are our priority.  As part of our continuing mission to provide you with exceptional heart care, we have created designated Provider Care Teams.  These Care Teams include your primary Cardiologist (physician) and Advanced Practice Providers (APPs -  Physician Assistants and Nurse Practitioners) who all work together to provide you with the care you need, when you need it.  We recommend signing up for the patient portal called "MyChart".  Sign up information is provided on this After Visit Summary.  MyChart is used to connect with patients for Virtual Visits (Telemedicine).  Patients are able to view lab/test results, encounter notes, upcoming appointments, etc.  Non-urgent messages can be sent to your provider as well.   To learn more about what you can do with MyChart, go to [email or web address].    Your next appointment:   4 month(s)  The format for your next appointment:   In Person  Provider:   You may see Kathlyn Sacramento, MD or one of the following Advanced Practice Providers on your designated Care Team:    Murray Hodgkins, NP  Christell Faith, PA-C  Marrianne Mood, PA-C  Cadence Staves, [place]  Laurann Montana, NP    Other Instructions N/A

## 2020-12-07 NOTE — Progress Notes (Signed)
Cardiology Office Note   Date:  12/07/2020   ID:  Andres Gandy Sr., DOB 1941/10/30, MRN [id number]  PCP:  [name], MD  Cardiologist:   Kathlyn Sacramento, MD   Chief Complaint  Patient presents with  . Follow-up    4 Months follow up and hypotension. Medications verbally reviewed with patient.       History of Present Illness: Andres ESKELSON Sr. is a 80 y.o. male who presents for a follow up regarding chronic systolic heart failure, chronic atrial fibrillation and coronary artery disease.   He had previous one-vessel CABG in 1994 after failed angioplasty on the left circumflex complicated by dissection.   Cardiac catheterization in December 2016 showed occluded SVG to OM 3. The mid left circumflex had diffuse 90% disease into a small OM3 which was also diffusely diseased and relatively small in size. EF was 15% with mildly elevated LVEDP. I recommended medical therapy. He had previous ventricular tachycardia and syncope that was successfully treated with ICD shock with subsequent adjustment of his device to deliver ATP.   He had ICD generator replacement in April 2019. Most recent echocardiogram in July 2021 showed an EF of 20 to 25% with moderate tricuspid regurgitation.  He was hospitalized in June 2021 with syncope and was found to have ventricular tachycardia treated with ATP.  CT scan showed subdural hematoma which necessitated holding Xarelto at that time.  At that time, he was transitioned to [facility] from ACE inhibitor.  Digoxin was added in September due to episodes of A. fib with RVR.  In addition, the dose of Toprol was increased to 25 mg twice daily. The patient had evidence of recurrent ventricular tachycardia on December 28.  At that time, he was exerting himself cooking barbecue for Christmas.  The cooking took days to complete and he was exhausted and exposed to smoke.  He is feeling better now overall.  No exertional chest pain.  He reports stable  dyspnea.  Past Medical History:  Diagnosis Date  . Arthritis   . Atrial fibrillation, persistent-long-term   . Chronic systolic congestive heart failure ([place])    a. 03/2015 Echo: EF 30-35%, diff HK, mild MR, mod dil LA, mildly dil RA, mild TR.  [name] Coronary artery disease    a. 1994 s/p CABG x 1 (VG->OM3);  b. 10/2015 Cath: LM nl, LAD min irregs, D1 nl, D2 min irregs, LCX 83m, OM2 nl, OM3 90 (small territory), VG->OM3 100, RCA min irregs, RPDA/RPL1/RPL2/RPL3 nl, EF 25%-->Med Rx.  [name] Hx of squamous cell carcinoma of skin 06/04/2013   L upper arm  . Hyperlipidemia   . ICD (implantable cardiac defibrillator) in place    a. 02/2012 s/p SJM 1257-40Q Fortify Assurance single lead AICD.  [name] Mixed Ischemic and Non-ischemic Cardiomyopathy    a. 07/2012 s/p SJM AICD (RV lead only);  b. 03/2015 Echo: EF 30-35%, diff HK;  c. EF 25% by LV gram.  . Nephrolithiasis 1970's  . Polymyalgia rheumatica (HCC)    On steroids  . Sleep apnea    On CPAP  . 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 Type II diabetes mellitus ([place])   . Ventricular tachycardia -inducible at EP testing    a. 07/2012 s/ SJM AICD.    Past Surgical History:  Procedure Laterality Date  . basel cell removal     left arm  . CARDIAC CATHETERIZATION  02/28/2012   Minor disease  in LAD and RCA, LCX: 70% mid and occluded distally. Patent SVG to OM3  . CARDIAC CATHETERIZATION N/A 11/08/2015   Procedure: Left Heart Cath and Cors/Grafts Angiography;  Surgeon: [name], MD;  Location: [place] CV LAB;  Service: Cardiovascular;  Laterality: N/A;  . CARDIOVERSION  03/01/2012   Procedure: CARDIOVERSION;  Surgeon: Deboraha Sprang, MD;  Location: [place];  Service: Cardiovascular;  Laterality: N/A;  . CATARACT EXTRACTION W/ INTRAOCULAR LENS  IMPLANT, BILATERAL  2011  . CORONARY ARTERY BYPASS GRAFT  1994   [place] ; CABG X1  . ELECTROPHYSIOLOGY STUDY N/A 02/28/2012   Procedure: ELECTROPHYSIOLOGY STUDY;  Surgeon:  Deboraha Sprang, MD;  Location: [facility] CATH LAB;  Service: Cardiovascular;  Laterality: N/A;  . ICD GENERATOR CHANGEOUT N/A 03/18/2018   Procedure: [place];  Surgeon: Evans Lance, MD;  Location: [place] CV LAB;  Service: Cardiovascular;  Laterality: N/A;  . INSERT / REPLACE / REMOVE PACEMAKER  02/28/12   "w/defibrillator"  . JOINT REPLACEMENT    . KNEE ARTHROSCOPY  2012   right  . LEFT HEART CATHETERIZATION WITH CORONARY/GRAFT ANGIOGRAM N/A 02/28/2012   Procedure: LEFT HEART CATHETERIZATION WITH [name];  Surgeon: [name], MD;  Location: [place] CATH LAB;  Service: Cardiovascular;  Laterality: N/A;  . TOTAL KNEE ARTHROPLASTY  2011   left     Current Outpatient Medications  Medication Sig Dispense Refill  . Cinnamon 500 MG TABS Take 1,000 mg by mouth 2 (two) times daily.     . Coenzyme Q10 (CO Q 10 PO) Take 300 mg by mouth daily.    . digoxin (LANOXIN) 0.125 MG tablet Take 1 tablet (0.125 mg total) by mouth daily. 90 tablet 3  . diphenhydramine-acetaminophen (TYLENOL PM) 25-500 MG TABS tablet Take 2 tablets by mouth at bedtime as needed.    [name] eplerenone (INSPRA) 25 MG tablet TAKE 1/2 TABLET BY MOUTH EVERY DAY 45 tablet 2  . fluticasone (FLONASE) 50 MCG/ACT nasal spray Place into the nose daily.    . folic acid (FOLVITE) 1 MG tablet Take 1 mg by mouth daily.     . furosemide (LASIX) 20 MG tablet Take 1 tablet (20 mg total) by mouth every other day. 90 tablet 1  . Garlic 123XX123 MG CAPS Take 1 capsule by mouth daily.     . Glucosamine-Chondroit-Vit C-Mn (GLUCOSAMINE CHONDR 1500 COMPLX PO) Take 1,500 mg by mouth 2 (two) times daily.     . insulin NPH Human (HUMULIN N,NOVOLIN N) 100 UNIT/ML injection Inject 95 Units into the skin at bedtime.     . insulin regular (NOVOLIN R,HUMULIN R) 100 units/mL injection Inject 30 Units into the skin 3 (three) times daily before meals.     [name] ipratropium (ATROVENT) 0.03 % nasal spray SMARTSIG:2 Spray(s) Both Nares 3 Times  Daily PRN    . levothyroxine (SYNTHROID, LEVOTHROID) 50 MCG tablet Take 50 mcg by mouth daily before breakfast.     . meclizine (ANTIVERT) 25 MG tablet     . metFORMIN (GLUCOPHAGE) 500 MG tablet Take 500 mg tablet in the am with 0.5 tablet in the pm.    . methotrexate (RHEUMATREX) 2.5 MG tablet as directed. Take 5 (12.5 mg) tablets by mouth every 7 days. Caution:Chemotherapy. Protect from light.    . metoprolol succinate (TOPROL-XL) 25 MG 24 hr tablet Take 1 tablet (25 mg total) by mouth every evening. 90 tablet 3  . Omega-3 Fatty Acids (FISH OIL PO) Take by mouth 2 (  two) times daily.     . rivaroxaban (XARELTO) 20 MG TABS tablet Take 1 tablet by mouth daily with breakfast.    . rosuvastatin (CRESTOR) 10 MG tablet Take 1 tablet by mouth daily.    . rosuvastatin (CRESTOR) 5 MG tablet Take 2 tablets (10mg ) daily alternating 1 tablet (5 mg) daily 45 tablet 11  . sacubitril-valsartan (ENTRESTO) 24-26 MG Take 1 tablet by mouth 2 (two) times daily. 60 tablet 6   No current facility-administered medications for this visit.    Allergies:   Patient has no known allergies.    Social History:  The patient  reports that he quit smoking about 32 years ago. His smoking use included cigarettes. He has a 25.00 pack-year smoking history. He has never used smokeless tobacco. He reports that he does not drink alcohol and does not use drugs.   Family History:  The patient's family history includes Heart attack in his brother and father.    ROS:  Please see the history of present illness.   Otherwise, review of systems are positive for none.   All other systems are reviewed and negative.    PHYSICAL EXAM: VS:  BP 120/62 (BP Location: Left Arm, Patient Position: Sitting, Cuff Size: Normal)   Pulse 71   Ht 5\' 10"  (1.778 m)   Wt 213 lb (96.6 kg)   SpO2 94%   BMI 30.56 kg/m  , BMI Body mass index is 30.56 kg/m. GEN: Well nourished, well developed, in no acute distress  HEENT: normal  Neck: no JVD,  carotid bruits, or masses Cardiac: Irregularly irregular; no murmurs, rubs, or gallops,no edema  Respiratory:  clear to auscultation bilaterally, normal work of breathing GI: soft, nontender, nondistended, + BS MS: no deformity or atrophy  Skin: warm and dry, no rash Neuro:  Strength and sensation are intact Psych: euthymic mood, full affect   EKG:  EKG is ordered today. The ekg ordered today demonstrates atrial fibrillation with low voltage and poor R wave progression in the anterior leads.  Recent Labs: 05/27/2020: TSH 1.691 05/28/2020: Hemoglobin 16.0; Platelets 208 11/25/2020: BUN 24; Creatinine, Ser 1.47; Magnesium 1.9; Potassium 4.1; Sodium 138    Lipid Panel No results found for: CHOL, TRIG, HDL, CHOLHDL, VLDL, LDLCALC, LDLDIRECT    Wt Readings from Last 3 Encounters:  12/07/20 213 lb (96.6 kg)  08/05/20 210 lb (95.3 kg)  07/15/20 214 lb 9.6 oz (97.3 kg)      ASSESSMENT AND PLAN:  1.  Chronic systolic heart failure: Most recent ejection fraction was 20-25 %..  He appears to be euvolemic.  Continue treatment with carvedilol, Entresto and eplerenone.  Not able to uptitrate the medication due to intermittent episodes of hypotension with systolic blood pressure occasionally going to the high 80s.  The patient feels lifeless when his blood pressure is that low.  2. Coronary artery disease: Mainly affecting the left circumflex supplying a small territory.  Continue medical therapy.  3. Chronic atrial fibrillation: Continue rate control with digoxin and Toprol.. Continue anticoagulation with Xarelto.    4. Diabetes mellitus:  Managed by primary care physician  5. Hyperlipidemia: He is not able to tolerate higher doses of statins but is able to take small dose rosuvastatin.  He takes 10 mg alternating with 5 mg.  6.  Recurrent ventricular tachycardia: The patient has an ICD in place.  If episodes become more frequent, an antiarrhythmic medication might be needed.  The patient  follows with Dr. Caryl Comes.   Disposition:  FU with me in 4 months  Signed,  Kathlyn Sacramento, MD  12/07/2020 4:20 PM    [facility]

## 2020-12-14 ENCOUNTER — Encounter: Payer: Medicare Other | Admitting: Internal Medicine

## 2020-12-24 ENCOUNTER — Other Ambulatory Visit: Payer: Self-pay | Admitting: Internal Medicine

## 2020-12-27 NOTE — H&P (View-Only) (Signed)
Patient Care Team: [name], MD as PCP - General (Family Medicine) Deboraha Sprang, MD as PCP - Electrophysiology (Cardiology) [name], MD as PCP - Cardiology (Cardiology)   HPI  Andres Gandy Sr. is a 80 y.o. male Seen in followup for ICD implanted for ischemic/nonischemic cardiomyopathy  and inducible ventricular tachycardia -April 2013 catheterization April 2013 demonstrating patent single vein graft to the OM. bypass had been  done emergently in [place] generator replacement 4/19 (GT)   Permanent atrial fibrillation on Xarelto without bleeding  Dyspnea on exertion chronic and stable.  No chest pain nocturnal dyspnea orthopnea.  Intercurrent episode of syncope.  Correlates with rapid ventricular tachycardia-paced terminated.  Felt not into the fire but into the car. DATE TEST EF   2011  Echo [place]  14 %   12/16 [facility]  15 % SVG-OM T; LAD/RCA non obstru  8/20 Echo  30-35% LAE mod  7/21 Echo  20-25%        Appropriate Therapy yes  VT monomorphic 5/18 Cl 220--240 and 7/21 assoc with syncope and complicated by subdural hematoma Recurrent VT 12/21 monomorphic and treated with ATP successfully  He was switched to [facility] for the adjunctive benefit of decreased ventricular tachycardia      Orthostatic intolerance persists but is generally not too bad-- orthostatic vital signs recorded 3/21 were normal Date Cr K TSH LFT Dig Hgb  4/18  1.4 5.2       4/19 0.36 4.2    14.3  5/20 1.3 4.8 (2/20)    15.6  7/21 1.36 4.5    16.0  12/21 1.47 4.1 5.49 (CE)  19 0.6(10/21) 24.2    Thromboembolic risk factors ( age  -2, HTN-1, DM-1, Vasc disease -1, CHF-1) for a CHADSVASc Score of >=6   He has permanent atrial fibrillation since April 2013.     Past Medical History:  Diagnosis Date  . Arthritis   . Atrial fibrillation, persistent-long-term   . Chronic systolic congestive heart failure ([place])    a. 03/2015 Echo: EF 30-35%, diff HK, mild MR, mod dil LA,  mildly dil RA, mild TR.  [name] Coronary artery disease    a. 1994 s/p CABG x 1 (VG->OM3);  b. 10/2015 Cath: LM nl, LAD min irregs, D1 nl, D2 min irregs, LCX 33m, OM2 nl, OM3 90 (small territory), VG->OM3 100, RCA min irregs, RPDA/RPL1/RPL2/RPL3 nl, EF 25%-->Med Rx.  [name] Hx of squamous cell carcinoma of skin 06/04/2013   L upper arm  . Hyperlipidemia   . ICD (implantable cardiac defibrillator) in place    a. 02/2012 s/p SJM 1257-40Q Fortify Assurance single lead AICD.  [name] Mixed Ischemic and Non-ischemic Cardiomyopathy    a. 07/2012 s/p SJM AICD (RV lead only);  b. 03/2015 Echo: EF 30-35%, diff HK;  c. EF 25% by LV gram.  . Nephrolithiasis 1970's  . Polymyalgia rheumatica (HCC)    On steroids  . Sleep apnea    On CPAP  . 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 Type II diabetes mellitus ([place])   . Ventricular tachycardia -inducible at EP testing    a. 07/2012 s/ SJM AICD.    Past Surgical History:  Procedure Laterality Date  . basel cell removal     left arm  . CARDIAC CATHETERIZATION  02/28/2012   Minor disease in LAD and RCA, LCX: 70% mid and occluded distally. Patent  SVG to OM3  . CARDIAC CATHETERIZATION N/A 11/08/2015   Procedure: Left Heart Cath and Cors/Grafts Angiography;  Surgeon: [name], MD;  Location: [place] CV LAB;  Service: Cardiovascular;  Laterality: N/A;  . CARDIOVERSION  03/01/2012   Procedure: CARDIOVERSION;  Surgeon: Deboraha Sprang, MD;  Location: [place];  Service: Cardiovascular;  Laterality: N/A;  . CATARACT EXTRACTION W/ INTRAOCULAR LENS  IMPLANT, BILATERAL  2011  . CORONARY ARTERY BYPASS GRAFT  1994   [place] ; CABG X1  . ELECTROPHYSIOLOGY STUDY N/A 02/28/2012   Procedure: ELECTROPHYSIOLOGY STUDY;  Surgeon: Deboraha Sprang, MD;  Location: [facility] CATH LAB;  Service: Cardiovascular;  Laterality: N/A;  . ICD GENERATOR CHANGEOUT N/A 03/18/2018   Procedure: [place];  Surgeon: Evans Lance, MD;  Location: [place] CV  LAB;  Service: Cardiovascular;  Laterality: N/A;  . INSERT / REPLACE / REMOVE PACEMAKER  02/28/12   "w/defibrillator"  . JOINT REPLACEMENT    . KNEE ARTHROSCOPY  2012   right  . LEFT HEART CATHETERIZATION WITH CORONARY/GRAFT ANGIOGRAM N/A 02/28/2012   Procedure: LEFT HEART CATHETERIZATION WITH [name];  Surgeon: [name], MD;  Location: [place] CATH LAB;  Service: Cardiovascular;  Laterality: N/A;  . TOTAL KNEE ARTHROPLASTY  2011   left    Current Outpatient Medications  Medication Sig Dispense Refill  . Cinnamon 500 MG TABS Take 1,000 mg by mouth 2 (two) times daily.     . Coenzyme Q10 (CO Q 10 PO) Take 300 mg by mouth daily.    . digoxin (LANOXIN) 0.125 MG tablet Take 1 tablet (0.125 mg total) by mouth daily. 90 tablet 3  . diphenhydramine-acetaminophen (TYLENOL PM) 25-500 MG TABS tablet Take 2 tablets by mouth at bedtime as needed.    [name] ENTRESTO 24-26 MG TAKE 1 TABLET BY MOUTH TWICE DAILY 180 tablet 2  . eplerenone (INSPRA) 25 MG tablet TAKE 1/2 TABLET BY MOUTH EVERY DAY 45 tablet 2  . fluticasone (FLONASE) 50 MCG/ACT nasal spray Place into the nose daily.    . folic acid (FOLVITE) 1 MG tablet Take 1 mg by mouth daily.     . furosemide (LASIX) 20 MG tablet Take 1 tablet (20 mg total) by mouth every other day. 90 tablet 1  . Garlic 8295 MG CAPS Take 1 capsule by mouth daily.     . Glucosamine-Chondroit-Vit C-Mn (GLUCOSAMINE CHONDR 1500 COMPLX PO) Take 1,500 mg by mouth 2 (two) times daily.     . insulin NPH Human (HUMULIN N,NOVOLIN N) 100 UNIT/ML injection Inject 95 Units into the skin at bedtime.     . insulin regular (NOVOLIN R,HUMULIN R) 100 units/mL injection Inject 30 Units into the skin 3 (three) times daily before meals.     [name] ipratropium (ATROVENT) 0.03 % nasal spray SMARTSIG:2 Spray(s) Both Nares 3 Times Daily PRN    . levothyroxine (SYNTHROID, LEVOTHROID) 50 MCG tablet Take 50 mcg by mouth daily before breakfast.     . meclizine (ANTIVERT) 25 MG tablet     .  metFORMIN (GLUCOPHAGE) 500 MG tablet Take 500 mg tablet in the am with 0.5 tablet in the pm.    . methotrexate (RHEUMATREX) 2.5 MG tablet as directed. Take 5 (12.5 mg) tablets by mouth every 7 days. Caution:Chemotherapy. Protect from light.    . metoprolol succinate (TOPROL-XL) 25 MG 24 hr tablet Take 1 tablet (25 mg total) by mouth every evening. 90 tablet 3  . Omega-3 Fatty Acids (FISH OIL PO) Take  by mouth 2 (two) times daily.     . rivaroxaban (XARELTO) 20 MG TABS tablet Take 1 tablet by mouth daily with breakfast.    . rosuvastatin (CRESTOR) 5 MG tablet Take 2 tablets (10mg ) daily alternating 1 tablet (5 mg) daily 45 tablet 11   No current facility-administered medications for this visit.    No Known Allergies  Review of Systems negative except from HPI and PMH  Physical Exam BP 124/70 (BP Location: Left Arm, Patient Position: Sitting, Cuff Size: Normal)   Pulse 66   Ht 5\' 10"  (1.778 m)   Wt 212 lb (96.2 kg)   SpO2 97%   BMI 30.42 kg/m  Well developed and well nourished in no acute distress HENT normal Neck supple with JVP-flat Clear Device pocket well healed; without hematoma or erythema.  There is no tethering  Irregularly irregular rate and rhythm, no  gallop No murmur Abd-soft with active BS No Clubbing cyanosis  edema Skin-warm and dry A & Oriented  Grossly normal sensory and motor function  ECG atrial fibrillation at 66 -/11/39 Occasional ventricular paced beats  Assessment and  Plan  Congestive heart failure-chronic-systolic  Atrial fibrillation-permanent  Cardiomyopathy ischemic/nonischemic  Orthostatic intolerance-improved  High Risk Medication Surveillance Dig/AMIO   Ventricular tachycardia -monomorphic-rapid  ICD single chamber-[employer]. [employer]   Bradycardia   Recurrent ventricular tachycardia again with rapid cycle length successful with ATP but associated with syncope.  Given the second episode this year, we will begin him on amiodarone.  Have  reviewed side effects.  He has known coronary disease although it was single-vessel.  Given the changes in his chronic dyspnea, have discussed with Dr. Gaylyn Cheers we will undertake catheterization.  Because of drug interactions will decrease digoxin to 0.625 daily.  Bradycardia may require discontinuation of his metoprolol.  Amiodarone surveillance labs are appropriate at baseline.  We will arrange for pulmonary function testing at baseline   No driving x 6 mo

## 2020-12-27 NOTE — Progress Notes (Signed)
Patient Care Team: [name], MD as PCP - General (Family Medicine) Deboraha Sprang, MD as PCP - Electrophysiology (Cardiology) [name], MD as PCP - Cardiology (Cardiology)   HPI  Andres Gandy Sr. is a 80 y.o. male Seen in followup for ICD implanted for ischemic/nonischemic cardiomyopathy  and inducible ventricular tachycardia -April 2013 catheterization April 2013 demonstrating patent single vein graft to the OM. bypass had been  done emergently in [place] generator replacement 4/19 (GT)   Permanent atrial fibrillation on Xarelto without bleeding  Dyspnea on exertion chronic and stable.  No chest pain nocturnal dyspnea orthopnea.  Intercurrent episode of syncope.  Correlates with rapid ventricular tachycardia-paced terminated.  Felt not into the fire but into the car. DATE TEST EF   2011  Echo [place]  14 %   12/16 LHC  15 % SVG-OM T; LAD/RCA non obstru  8/20 Echo  30-35% LAE mod  7/21 Echo  20-25%        Appropriate Therapy yes  VT monomorphic 5/18 Cl 220--240 and 7/21 assoc with syncope and complicated by subdural hematoma Recurrent VT 12/21 monomorphic and treated with ATP successfully  He was switched to [facility] for the adjunctive benefit of decreased ventricular tachycardia      Orthostatic intolerance persists but is generally not too bad-- orthostatic vital signs recorded 3/21 were normal Date Cr K TSH LFT Dig Hgb  4/18  1.4 5.2       4/19 0.36 4.2    14.3  5/20 1.3 4.8 (2/20)    15.6  7/21 1.36 4.5    16.0  12/21 1.47 4.1 5.49 (CE)  19 0.6(10/21) 24.2    Thromboembolic risk factors ( age  -2, HTN-1, DM-1, Vasc disease -1, CHF-1) for a CHADSVASc Score of >=6   He has permanent atrial fibrillation since April 2013.     Past Medical History:  Diagnosis Date  . Arthritis   . Atrial fibrillation, persistent-long-term   . Chronic systolic congestive heart failure ([place])    a. 03/2015 Echo: EF 30-35%, diff HK, mild MR, mod dil LA,  mildly dil RA, mild TR.  [name] Coronary artery disease    a. 1994 s/p CABG x 1 (VG->OM3);  b. 10/2015 Cath: LM nl, LAD min irregs, D1 nl, D2 min irregs, LCX 33m, OM2 nl, OM3 90 (small territory), VG->OM3 100, RCA min irregs, RPDA/RPL1/RPL2/RPL3 nl, EF 25%-->Med Rx.  [name] Hx of squamous cell carcinoma of skin 06/04/2013   L upper arm  . Hyperlipidemia   . ICD (implantable cardiac defibrillator) in place    a. 02/2012 s/p SJM [id number] Fortify Assurance single lead AICD.  [name] Mixed Ischemic and Non-ischemic Cardiomyopathy    a. 07/2012 s/p SJM AICD (RV lead only);  b. 03/2015 Echo: EF 30-35%, diff HK;  c. EF 25% by LV gram.  . Nephrolithiasis 1970's  . Polymyalgia rheumatica (HCC)    On steroids  . Sleep apnea    On CPAP  . 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 Type II diabetes mellitus ([place])   . Ventricular tachycardia -inducible at EP testing    a. 07/2012 s/ SJM AICD.    Past Surgical History:  Procedure Laterality Date  . basel cell removal     left arm  . CARDIAC CATHETERIZATION  02/28/2012   Minor disease in LAD and RCA, LCX: 70% mid and occluded distally. Patent  SVG to OM3  . CARDIAC CATHETERIZATION N/A 11/08/2015   Procedure: Left Heart Cath and Cors/Grafts Angiography;  Surgeon: [name], MD;  Location: [place] [facility];  Service: Cardiovascular;  Laterality: N/A;  . CARDIOVERSION  03/01/2012   Procedure: CARDIOVERSION;  Surgeon: Deboraha Sprang, MD;  Location: [place];  Service: Cardiovascular;  Laterality: N/A;  . CATARACT EXTRACTION W/ INTRAOCULAR LENS  IMPLANT, BILATERAL  2011  . CORONARY ARTERY BYPASS GRAFT  1994   [place] ; CABG X1  . ELECTROPHYSIOLOGY STUDY N/A 02/28/2012   Procedure: ELECTROPHYSIOLOGY STUDY;  Surgeon: Deboraha Sprang, MD;  Location: [facility] CATH LAB;  Service: Cardiovascular;  Laterality: N/A;  . ICD GENERATOR CHANGEOUT N/A 03/18/2018   Procedure: [place];  Surgeon: Evans Lance, MD;  Location: [place] [facility]  [facility];  Service: Cardiovascular;  Laterality: N/A;  . INSERT / REPLACE / REMOVE PACEMAKER  02/28/12   "w/defibrillator"  . JOINT REPLACEMENT    . KNEE ARTHROSCOPY  2012   right  . LEFT HEART CATHETERIZATION WITH CORONARY/GRAFT ANGIOGRAM N/A 02/28/2012   Procedure: LEFT HEART CATHETERIZATION WITH [name];  Surgeon: [name], MD;  Location: [place] CATH LAB;  Service: Cardiovascular;  Laterality: N/A;  . TOTAL KNEE ARTHROPLASTY  2011   left    Current Outpatient Medications  Medication Sig Dispense Refill  . Cinnamon 500 MG TABS Take 1,000 mg by mouth 2 (two) times daily.     . Coenzyme Q10 (CO Q 10 PO) Take 300 mg by mouth daily.    . digoxin (LANOXIN) 0.125 MG tablet Take 1 tablet (0.125 mg total) by mouth daily. 90 tablet 3  . diphenhydramine-acetaminophen (TYLENOL PM) 25-500 MG TABS tablet Take 2 tablets by mouth at bedtime as needed.    [name] ENTRESTO 24-26 MG TAKE 1 TABLET BY MOUTH TWICE DAILY 180 tablet 2  . eplerenone (INSPRA) 25 MG tablet TAKE 1/2 TABLET BY MOUTH EVERY DAY 45 tablet 2  . fluticasone (FLONASE) 50 MCG/ACT nasal spray Place into the nose daily.    . folic acid (FOLVITE) 1 MG tablet Take 1 mg by mouth daily.     . furosemide (LASIX) 20 MG tablet Take 1 tablet (20 mg total) by mouth every other day. 90 tablet 1  . Garlic 8295 MG CAPS Take 1 capsule by mouth daily.     . Glucosamine-Chondroit-Vit C-Mn (GLUCOSAMINE CHONDR 1500 COMPLX PO) Take 1,500 mg by mouth 2 (two) times daily.     . insulin NPH Human (HUMULIN N,NOVOLIN N) 100 UNIT/ML injection Inject 95 Units into the skin at bedtime.     . insulin regular (NOVOLIN R,HUMULIN R) 100 units/mL injection Inject 30 Units into the skin 3 (three) times daily before meals.     [name] ipratropium (ATROVENT) 0.03 % nasal spray SMARTSIG:2 Spray(s) Both Nares 3 Times Daily PRN    . levothyroxine (SYNTHROID, LEVOTHROID) 50 MCG tablet Take 50 mcg by mouth daily before breakfast.     . meclizine (ANTIVERT) 25 MG tablet     .  metFORMIN (GLUCOPHAGE) 500 MG tablet Take 500 mg tablet in the am with 0.5 tablet in the pm.    . methotrexate (RHEUMATREX) 2.5 MG tablet as directed. Take 5 (12.5 mg) tablets by mouth every 7 days. Caution:Chemotherapy. Protect from light.    . metoprolol succinate (TOPROL-XL) 25 MG 24 hr tablet Take 1 tablet (25 mg total) by mouth every evening. 90 tablet 3  . Omega-3 Fatty Acids (FISH OIL PO) Take  by mouth 2 (two) times daily.     . rivaroxaban (XARELTO) 20 MG TABS tablet Take 1 tablet by mouth daily with breakfast.    . rosuvastatin (CRESTOR) 5 MG tablet Take 2 tablets (10mg ) daily alternating 1 tablet (5 mg) daily 45 tablet 11   No current facility-administered medications for this visit.    No Known Allergies  Review of Systems negative except from HPI and PMH  Physical Exam BP 124/70 (BP Location: Left Arm, Patient Position: Sitting, Cuff Size: Normal)   Pulse 66   Ht 5\' 10"  (1.778 m)   Wt 212 lb (96.2 kg)   SpO2 97%   BMI 30.42 kg/m  Well developed and well nourished in no acute distress HENT normal Neck supple with JVP-flat Clear Device pocket well healed; without hematoma or erythema.  There is no tethering  Irregularly irregular rate and rhythm, no  gallop No murmur Abd-soft with active BS No Clubbing cyanosis  edema Skin-warm and dry A & Oriented  Grossly normal sensory and motor function  ECG atrial fibrillation at 66 -/11/39 Occasional ventricular paced beats  Assessment and  Plan  Congestive heart failure-chronic-systolic  Atrial fibrillation-permanent  Cardiomyopathy ischemic/nonischemic  Orthostatic intolerance-improved  High Risk Medication Surveillance Dig/AMIO   Ventricular tachycardia -monomorphic-rapid  ICD single chamber-St. Jude   Bradycardia   Recurrent ventricular tachycardia again with rapid cycle length successful with ATP but associated with syncope.  Given the second episode this year, we will begin him on amiodarone.  Have  reviewed side effects.  He has known coronary disease although it was single-vessel.  Given the changes in his chronic dyspnea, have discussed with Dr. Gaylyn Cheers we will undertake catheterization.  Because of drug interactions will decrease digoxin to 0.625 daily.  Bradycardia may require discontinuation of his metoprolol.  Amiodarone surveillance labs are appropriate at baseline.  We will arrange for pulmonary function testing at baseline   No driving x 6 mo

## 2020-12-28 ENCOUNTER — Other Ambulatory Visit: Payer: Self-pay

## 2020-12-28 ENCOUNTER — Ambulatory Visit: Payer: Medicare Other | Admitting: Internal Medicine

## 2020-12-28 ENCOUNTER — Encounter: Payer: Self-pay | Admitting: Internal Medicine

## 2020-12-28 VITALS — BP 124/70 | HR 66 | Ht 70.0 in | Wt 212.0 lb

## 2020-12-28 DIAGNOSIS — I255 Ischemic cardiomyopathy: Secondary | ICD-10-CM | POA: Diagnosis not present

## 2020-12-28 DIAGNOSIS — I5022 Chronic systolic (congestive) heart failure: Secondary | ICD-10-CM

## 2020-12-28 DIAGNOSIS — Z95810 Presence of automatic (implantable) cardiac defibrillator: Secondary | ICD-10-CM | POA: Diagnosis not present

## 2020-12-28 DIAGNOSIS — Z01812 Encounter for preprocedural laboratory examination: Secondary | ICD-10-CM

## 2020-12-28 DIAGNOSIS — I472 Ventricular tachycardia, unspecified: Secondary | ICD-10-CM

## 2020-12-28 DIAGNOSIS — I2510 Atherosclerotic heart disease of native coronary artery without angina pectoris: Secondary | ICD-10-CM

## 2020-12-28 DIAGNOSIS — I482 Chronic atrial fibrillation, unspecified: Secondary | ICD-10-CM

## 2020-12-28 LAB — CUP PACEART INCLINIC DEVICE CHECK
Battery Remaining Longevity: 76 mo
Brady Statistic RV Percent Paced: 4.4 %
Date Time Interrogation Session: 20220201094300
HighPow Impedance: 75.375
Implantable Lead Implant Date: 20130403
Implantable Lead Location: 753860
Implantable Pulse Generator Implant Date: 20190422
Lead Channel Impedance Value: 437.5 Ohm
Lead Channel Pacing Threshold Amplitude: 1 V
Lead Channel Pacing Threshold Pulse Width: 0.5 ms
Lead Channel Sensing Intrinsic Amplitude: 11.7 mV
Lead Channel Setting Pacing Amplitude: 2.5 V
Lead Channel Setting Pacing Pulse Width: 0.5 ms
Lead Channel Setting Sensing Sensitivity: 0.5 mV
Pulse Gen Serial Number: 9811176

## 2020-12-28 LAB — PACEMAKER DEVICE OBSERVATION

## 2020-12-28 MED ORDER — DIGOXIN 125 MCG PO TABS: 0.0625 mg | ORAL_TABLET | Freq: Every day | ORAL | Status: DC

## 2020-12-28 MED ORDER — AMIODARONE HCL 400 MG PO TABS: ORAL_TABLET | ORAL | 0 refills | Status: DC

## 2020-12-28 NOTE — Patient Instructions (Addendum)
Medication Instructions:  - Your physician has recommended you make the following change in your medication:   1) DECREASE digoxin 0.125 mg- take 0.5 tablet (0.0625 mg) by mouth once daily  2) START amiodarone 400 mg: - take 1 tablet (400 mg) by mouth twice x 2 weeks then,  - take 1 tablet (400 mg) by mouth once daily x 2 weeks, then  START amiodarone 200 mg: - take 1 tablet (200 mg) by mouth once daily   *If you need a refill on your cardiac medications before your next appointment, please call your pharmacy*   Lab Work: 1) Pre procedure lab work: today- BMP/ CBC  2) Pre procedure COVID swab: Thursday 01/06/21 (8:00 am- 1:00 pm) [facility] En[facility] Drive up test only- staff will come out to the car to swab you   If you have labs (blood work) drawn today and your tests are completely normal, you will receive your results only by: [name] MyChart Message (if you have MyChart) OR . A paper copy in the mail If you have any lab test that is abnormal or we need to change your treatment, we will call you to review the results.   Testing/Procedures: - Your physician has requested that you have a cardiac catheterization. Cardiac catheterization is used to diagnose and/or treat various heart conditions. Doctors may recommend this procedure for a number of different reasons. The most common reason is to evaluate chest pain. Chest pain can be a symptom of coronary artery disease (CAD), and cardiac catheterization can show whether plaque is narrowing or blocking your heart's arteries. This procedure is also used to evaluate the valves, as well as measure the blood flow and oxygen levels in different parts of your heart.      [address], [place] [id number] Dept: [phone] Loc: [place].  12/28/2020  You are scheduled for a Cardiac Catheterization on Monday,  February 14 with Dr. Kathlyn Sacramento.  1. Please arrive at the [place] at [facility], 1st desk on the right (Registration) at _________________ (This time is one hour before your procedure to ensure your preparation). Free valet parking service is available.   Special note: Every effort is made to have your procedure done on time. Please understand that emergencies sometimes delay scheduled procedures.  2. Diet: Do not eat solid foods after midnight.  You may have clear liquids until 5am upon the day of the procedure.  3. Labs: as above.  4. Medication instructions in preparation for your procedure:   Contrast Allergy: No    Stop taking Xarelto (Rivaroxaban) on Saturday, February 13.  Stop taking, Lasix (Furosemide)  Monday, February 14,  Take a 1/2 of your regular dose of insulin to night before your procedure. Hold all insulin the morning of your procedure.  Do not take Diabetes Med Glucophage (Metformin) on the day of the procedure and HOLD 48 HOURS AFTER THE PROCEDURE.  On the morning of your procedure, take your Aspirin 81 mg and any morning medicines NOT listed above.  You may use sips of water.  5. Plan for one night stay--bring personal belongings. 6. Bring a current list of your medications and current insurance cards. 7. You MUST have a responsible person to drive you home. 8. Someone MUST be with you the first 24 hours after you arrive home or your discharge will be delayed. 9. Please wear clothes that  are easy to get on and off and wear slip-on shoes.  Thank you for allowing [place] to care for you!   -- [facility]    Follow-Up: At [facility], you and your health needs are our priority.  As part of our continuing mission to provide you with exceptional heart care, we have created designated Provider Care Teams.  These Care Teams include your primary Cardiologist (physician) and Advanced Practice Providers (APPs -  Physician  Assistants and Nurse Practitioners) who all work together to provide you with the care you need, when you need it.  We recommend signing up for the patient portal called "MyChart".  Sign up information is provided on this After Visit Summary.  MyChart is used to connect with patients for Virtual Visits (Telemedicine).  Patients are able to view lab/test results, encounter notes, upcoming appointments, etc.  Non-urgent messages can be sent to your provider as well.   To learn more about what you can do with MyChart, go to [email or web address].    Your next appointment:   6 week(s)  The format for your next appointment:   In Person  Provider:   Virl Axe, MD   Other Instructions   Amiodarone tablets What is this medicine? AMIODARONE (a MEE oh da rone) is an antiarrhythmic drug. It helps make your heart beat regularly. Because of the side effects caused by this medicine, it is only used when other medicines have not worked. It is usually used for heartbeat problems that may be life threatening. This medicine may be used for other purposes; ask your health care provider or pharmacist if you have questions. COMMON BRAND NAME(S): Cordarone, Pacerone What should I tell my health care provider before I take this medicine? They need to know if you have any of these conditions:  liver disease  lung disease  other heart problems  thyroid disease  an unusual or allergic reaction to amiodarone, iodine, other medicines, foods, dyes, or preservatives  pregnant or trying to get pregnant  breast-feeding How should I use this medicine? Take this medicine by mouth with a glass of water. Follow the directions on the prescription label. You can take this medicine with or without food. However, you should always take it the same way each time. Take your doses at regular intervals. Do not take your medicine more often than directed. Do not stop taking except on the advice of your doctor or  health care professional. A special MedGuide will be given to you by the pharmacist with each prescription and refill. Be sure to read this information carefully each time. Talk to your pediatrician regarding the use of this medicine in children. Special care may be needed. Overdosage: If you think you have taken too much of this medicine contact a poison control center or emergency room at once. NOTE: This medicine is only for you. Do not share this medicine with others. What if I miss a dose? If you miss a dose, take it as soon as you can. If it is almost time for your next dose, take only that dose. Do not take double or extra doses. What may interact with this medicine? Do not take this medicine with any of the following medications:  abarelix  apomorphine  arsenic trioxide  certain antibiotics like erythromycin, gemifloxacin, levofloxacin, pentamidine  certain medicines for depression like amoxapine, tricyclic antidepressants  certain medicines for fungal infections like fluconazole, itraconazole, ketoconazole, posaconazole, voriconazole  certain medicines for irregular heart beat like  disopyramide, dronedarone, ibutilide, propafenone, sotalol  certain medicines for malaria like chloroquine, halofantrine  cisapride  droperidol  haloperidol  haw[place]orn  maprotiline  methadone  phenothiazines like chlorpromazine, mesoridazine, thioridazine  pimozide  ranolazine  red yeast rice  vardenafil This medicine may also interact with the following medications:  antiviral medicines for HIV or AIDS  certain medicines for blood pressure, heart disease, irregular heart beat  certain medicines for cholesterol like atorvastatin, cerivastatin, lovastatin, simvastatin  certain medicines for hepatitis C like sofosbuvir and ledipasvir; sofosbuvir  certain medicines for seizures like phenytoin  certain medicines for thyroid problems  certain medicines that treat or prevent  blood clots like warfarin  cholestyramine  cimetidine  clopidogrel  cyclosporine  dextromethorphan  diuretics  dofetilide  fentanyl  general anesthetics  grapefruit juice  lidocaine  loratadine  methotrexate  other medicines that prolong the QT interval (cause an abnormal heart rhythm)  procainamide  quinidine  rifabutin, rifampin, or rifapentine  St. John's Wort  trazodone  ziprasidone This list may not describe all possible interactions. Give your health care provider a list of all the medicines, herbs, non-prescription drugs, or dietary supplements you use. Also tell them if you smoke, drink alcohol, or use illegal drugs. Some items may interact with your medicine. What should I watch for while using this medicine? Your condition will be monitored closely when you first begin therapy. Often, this drug is first started in a hospital or other monitored health care setting. Once you are on maintenance therapy, visit your doctor or health care professional for regular checks on your progress. Because your condition and use of this medicine carry some risk, it is a good idea to carry an identification card, necklace or bracelet with details of your condition, medications, and doctor or health care professional. Dennis Bast may get drowsy or dizzy. Do not drive, use machinery, or do anything that needs mental alertness until you know how this medicine affects you. Do not stand or sit up quickly, especially if you are an older patient. This reduces the risk of dizzy or fainting spells. This medicine can make you more sensitive to the sun. Keep out of the sun. If you cannot avoid being in the sun, wear protective clothing and use sunscreen. Do not use sun lamps or tanning beds/booths. You should have regular eye exams before and during treatment. Call your doctor if you have blurred vision, see halos, or your eyes become sensitive to light. Your eyes may get dry. It may be helpful to  use a lubricating eye solution or artificial tears solution. If you are going to have surgery or a procedure that requires contrast dyes, tell your doctor or health care professional that you are taking this medicine. What side effects may I notice from receiving this medicine? Side effects that you should report to your doctor or health care professional as soon as possible:  allergic reactions like skin rash, itching or hives, swelling of the face, lips, or tongue  blue-gray coloring of the skin  blurred vision, seeing blue green halos, increased sensitivity of the eyes to light  breathing problems  chest pain  dark urine  fast, irregular heartbeat  feeling faint or light-headed  intolerance to heat or cold  nausea or vomiting  pain and swelling of the scrotum  pain, tingling, numbness in feet, hands  redness, blistering, peeling or loosening of the skin, including inside the mouth  spitting up blood  stomach pain  sweating  unusual or uncontrolled movements  of body  unusually weak or tired  weight gain or loss  yellowing of the eyes or skin Side effects that usually do not require medical attention (report to your doctor or health care professional if they continue or are bothersome):  change in sex drive or performance  constipation  dizziness  headache  loss of appetite  trouble sleeping This list may not describe all possible side effects. Call your doctor for medical advice about side effects. You may report side effects to FDA at 1-800-FDA-1088. Where should I keep my medicine? Keep out of the reach of children. Store at room temperature between 20 and 25 degrees C (68 and 77 degrees F). Protect from light. Keep container tightly closed. Throw away any unused medicine after the expiration date. NOTE: This sheet is a summary. It may not cover all possible information. If you have questions about this medicine, talk to your doctor, pharmacist, or  health care provider.  2021 Elsevier/Gold Standard (2018-10-16 13:44:04)

## 2020-12-29 LAB — BASIC METABOLIC PANEL
BUN/Creatinine Ratio: 16 (ref 10–24)
BUN: 24 mg/dL (ref 8–27)
CO2: 22 mmol/L (ref 20–29)
Calcium: 9.7 mg/dL (ref 8.6–10.2)
Chloride: 101 mmol/L (ref 96–106)
Creatinine, Ser: 1.53 mg/dL — ABNORMAL HIGH (ref 0.76–1.27)
GFR calc Af Amer: 49 mL/min/{1.73_m2} — ABNORMAL LOW (ref >59)
GFR calc non Af Amer: 42 mL/min/{1.73_m2} — ABNORMAL LOW (ref >59)
Glucose: 195 mg/dL — ABNORMAL HIGH (ref 65–99)
Potassium: 5.1 mmol/L (ref 3.5–5.2)
Sodium: 138 mmol/L (ref 134–144)

## 2020-12-29 LAB — CBC WITH DIFFERENTIAL/PLATELET
Basophils Absolute: 0.1 10*3/uL (ref 0.0–0.2)
Basos: 1 %
EOS (ABSOLUTE): 0.1 10*3/uL (ref 0.0–0.4)
Eos: 1 %
Hematocrit: 47.5 % (ref 37.5–51.0)
Hemoglobin: 16.3 g/dL (ref 13.0–17.7)
Immature Grans (Abs): 0 10*3/uL (ref 0.0–0.1)
Immature Granulocytes: 0 %
Lymphocytes Absolute: 2 10*3/uL (ref 0.7–3.1)
Lymphs: 26 %
MCH: 33.7 pg — ABNORMAL HIGH (ref 26.6–33.0)
MCHC: 34.3 g/dL (ref 31.5–35.7)
MCV: 98 fL — ABNORMAL HIGH (ref 79–97)
Monocytes Absolute: 0.9 10*3/uL (ref 0.1–0.9)
Monocytes: 11 %
Neutrophils Absolute: 4.6 10*3/uL (ref 1.4–7.0)
Neutrophils: 61 %
Platelets: 210 10*3/uL (ref 150–450)
RBC: 4.84 x10E6/uL (ref 4.14–5.80)
RDW: 14.8 % (ref 11.6–15.4)
WBC: 7.7 10*3/uL (ref 3.4–10.8)

## 2021-01-06 ENCOUNTER — Other Ambulatory Visit
Admission: RE | Admit: 2021-01-06 | Discharge: 2021-01-06 | Disposition: A | Discharge status: Home / Self Care | Payer: Medicare Other | Source: Ambulatory Visit | Attending: Cardiovascular Disease | Admitting: Cardiovascular Disease

## 2021-01-06 ENCOUNTER — Other Ambulatory Visit: Payer: Self-pay

## 2021-01-06 DIAGNOSIS — Z20822 Contact with and (suspected) exposure to covid-19: Secondary | ICD-10-CM | POA: Insufficient documentation

## 2021-01-06 DIAGNOSIS — Z01812 Encounter for preprocedural laboratory examination: Secondary | ICD-10-CM | POA: Diagnosis present

## 2021-01-06 LAB — SARS CORONAVIRUS 2 (TAT 6-24 HRS): SARS Coronavirus 2: NEGATIVE

## 2021-01-07 ENCOUNTER — Telehealth: Payer: Self-pay | Admitting: Internal Medicine

## 2021-01-07 ENCOUNTER — Other Ambulatory Visit: Payer: Self-pay | Admitting: Physician Assistant

## 2021-01-07 NOTE — Progress Notes (Signed)
Orders for cath placed. He will receive 100 mL/min of IV fluids x 2 hours prior to case. Discussed with MD.

## 2021-01-07 NOTE — Telephone Encounter (Signed)
Andres Sprang, MD  12/29/2020 7:38 PM EST      Please Inform Patient  Labs are normal x stable mild kidney weakness  Thanks

## 2021-01-07 NOTE — Telephone Encounter (Signed)
I called and spoke with the patient regarding his pre-cath lab results.  I had reviewed these with [name].  The patient is scheduled for a left heart cath on 01/10/21 at 9:30 am. Per [name], due to renal insufficiency he needs to come in 2 hours early for pre-hydration.  I have notified the patient that his COVID test is also negative, but that he will need to arrive at 7:00 am to check in for his cath on 01/10/21 so hydration can be run for 2 hours prior to his cath due to his renal function.  The patient voices understanding and is agreeable.   I have spoken with Christell Faith, PA and he will place cath orders for me.

## 2021-01-10 ENCOUNTER — Encounter: Payer: Self-pay | Admitting: Cardiovascular Disease

## 2021-01-10 ENCOUNTER — Ambulatory Visit
Admission: RE | Admit: 2021-01-10 | Discharge: 2021-01-10 | Disposition: A | Discharge status: Home / Self Care | Payer: Medicare Other | Attending: Cardiovascular Disease | Admitting: Cardiovascular Disease

## 2021-01-10 ENCOUNTER — Encounter
Admission: RE | Disposition: A | Discharge status: Home / Self Care | Payer: Self-pay | Source: Home / Self Care | Attending: Cardiovascular Disease

## 2021-01-10 ENCOUNTER — Other Ambulatory Visit: Payer: Self-pay

## 2021-01-10 DIAGNOSIS — Z95810 Presence of automatic (implantable) cardiac defibrillator: Secondary | ICD-10-CM | POA: Insufficient documentation

## 2021-01-10 DIAGNOSIS — Z79890 Hormone replacement therapy (postmenopausal): Secondary | ICD-10-CM | POA: Insufficient documentation

## 2021-01-10 DIAGNOSIS — I472 Ventricular tachycardia, unspecified: Secondary | ICD-10-CM

## 2021-01-10 DIAGNOSIS — I5022 Chronic systolic (congestive) heart failure: Secondary | ICD-10-CM | POA: Diagnosis not present

## 2021-01-10 DIAGNOSIS — I2510 Atherosclerotic heart disease of native coronary artery without angina pectoris: Secondary | ICD-10-CM | POA: Diagnosis present

## 2021-01-10 DIAGNOSIS — Z7901 Long term (current) use of anticoagulants: Secondary | ICD-10-CM | POA: Diagnosis not present

## 2021-01-10 DIAGNOSIS — Z79899 Other long term (current) drug therapy: Secondary | ICD-10-CM | POA: Insufficient documentation

## 2021-01-10 DIAGNOSIS — I25810 Atherosclerosis of coronary artery bypass graft(s) without angina pectoris: Secondary | ICD-10-CM | POA: Diagnosis not present

## 2021-01-10 DIAGNOSIS — I255 Ischemic cardiomyopathy: Secondary | ICD-10-CM | POA: Insufficient documentation

## 2021-01-10 DIAGNOSIS — Z794 Long term (current) use of insulin: Secondary | ICD-10-CM | POA: Diagnosis not present

## 2021-01-10 DIAGNOSIS — R001 Bradycardia, unspecified: Secondary | ICD-10-CM | POA: Insufficient documentation

## 2021-01-10 DIAGNOSIS — Z7984 Long term (current) use of oral hypoglycemic drugs: Secondary | ICD-10-CM | POA: Diagnosis not present

## 2021-01-10 DIAGNOSIS — I4821 Permanent atrial fibrillation: Secondary | ICD-10-CM | POA: Insufficient documentation

## 2021-01-10 HISTORY — PX: LEFT HEART CATH AND CORONARY ANGIOGRAPHY: CATH118249

## 2021-01-10 HISTORY — DX: Cardiac arrhythmia, unspecified: I49.9

## 2021-01-10 LAB — GLUCOSE, CAPILLARY: Glucose-Capillary: 138 mg/dL — ABNORMAL HIGH (ref 70–99)

## 2021-01-10 SURGERY — LEFT HEART CATH AND CORONARY ANGIOGRAPHY
Anesthesia: Moderate Sedation

## 2021-01-10 MED ORDER — LIDOCAINE HCL (PF) 1 % IJ SOLN
INTRAMUSCULAR | Status: DC | PRN
  Administered 2021-01-10: 2 mL

## 2021-01-10 MED ORDER — HEPARIN SODIUM (PORCINE) 1000 UNIT/ML IJ SOLN
INTRAMUSCULAR | Status: DC | PRN
  Administered 2021-01-10: 5000 [IU] via INTRAVENOUS

## 2021-01-10 MED ORDER — FENTANYL CITRATE (PF) 100 MCG/2ML IJ SOLN
INTRAMUSCULAR | Status: DC | PRN
  Administered 2021-01-10: 50 ug via INTRAVENOUS
  Administered 2021-01-10: 25 ug via INTRAVENOUS

## 2021-01-10 MED ORDER — IOHEXOL 300 MG/ML  SOLN
INTRAMUSCULAR | Status: DC | PRN
  Administered 2021-01-10: 35 mL

## 2021-01-10 MED ORDER — FENTANYL CITRATE (PF) 100 MCG/2ML IJ SOLN
INTRAMUSCULAR | Status: AC
  Filled 2021-01-10: qty 2

## 2021-01-10 MED ORDER — HEPARIN (PORCINE) IN NACL 1000-0.9 UT/500ML-% IV SOLN
INTRAVENOUS | Status: DC | PRN
  Administered 2021-01-10: 500 mL

## 2021-01-10 MED ORDER — MIDAZOLAM HCL 2 MG/2ML IJ SOLN
INTRAMUSCULAR | Status: DC | PRN
  Administered 2021-01-10 (×2): 1 mg via INTRAVENOUS

## 2021-01-10 MED ORDER — SODIUM CHLORIDE 0.9 % IV SOLN: 250.0000 mL | INTRAVENOUS | Status: DC | PRN

## 2021-01-10 MED ORDER — SODIUM CHLORIDE 0.9% FLUSH: 3.0000 mL | INTRAVENOUS | Status: DC | PRN

## 2021-01-10 MED ORDER — VERAPAMIL HCL 2.5 MG/ML IV SOLN
INTRAVENOUS | Status: DC | PRN
  Administered 2021-01-10: 2.5 mg via INTRA_ARTERIAL

## 2021-01-10 MED ORDER — MIDAZOLAM HCL 2 MG/2ML IJ SOLN
INTRAMUSCULAR | Status: AC
  Filled 2021-01-10: qty 2

## 2021-01-10 MED ORDER — HEPARIN (PORCINE) IN NACL 1000-0.9 UT/500ML-% IV SOLN
INTRAVENOUS | Status: AC
  Filled 2021-01-10: qty 1000

## 2021-01-10 MED ORDER — ACETAMINOPHEN 325 MG PO TABS: 650.0000 mg | ORAL_TABLET | ORAL | Status: DC | PRN

## 2021-01-10 MED ORDER — HEPARIN SODIUM (PORCINE) 1000 UNIT/ML IJ SOLN
INTRAMUSCULAR | Status: AC
  Filled 2021-01-10: qty 1

## 2021-01-10 MED ORDER — SODIUM CHLORIDE 0.9 % IV SOLN: INTRAVENOUS | Status: DC

## 2021-01-10 MED ORDER — VERAPAMIL HCL 2.5 MG/ML IV SOLN
INTRAVENOUS | Status: AC
  Filled 2021-01-10: qty 2

## 2021-01-10 MED ORDER — SODIUM CHLORIDE 0.9% FLUSH: 3.0000 mL | Freq: Two times a day (BID) | INTRAVENOUS | Status: DC

## 2021-01-10 MED ORDER — ONDANSETRON HCL 4 MG/2ML IJ SOLN: 4.0000 mg | Freq: Four times a day (QID) | INTRAMUSCULAR | Status: DC | PRN

## 2021-01-10 MED ORDER — LIDOCAINE HCL (PF) 1 % IJ SOLN
INTRAMUSCULAR | Status: AC
  Filled 2021-01-10: qty 30

## 2021-01-10 MED ORDER — SODIUM CHLORIDE 0.9 % IV SOLN
INTRAVENOUS | Status: AC
  Administered 2021-01-10: 1000 mL via INTRAVENOUS

## 2021-01-10 SURGICAL SUPPLY — 8 items
CATH INFINITI 5FR JK (CATHETERS) ×2 IMPLANT
DEVICE RAD COMP TR BAND LRG (VASCULAR PRODUCTS) ×2 IMPLANT
GLIDESHEATH SLEND SS 6F .021 (SHEATH) IMPLANT
GUIDEWIRE INQWIRE 1.5J.035X260 (WIRE) ×1 IMPLANT
INQWIRE 1.5J .035X260CM (WIRE) ×2
KIT MANI 3VAL PERCEP (MISCELLANEOUS) ×2 IMPLANT
PACK CARDIAC CATH (CUSTOM PROCEDURE TRAY) ×2 IMPLANT
WIRE HITORQ VERSACORE ST 145CM (WIRE) ×2 IMPLANT

## 2021-01-10 NOTE — Discharge Instructions (Signed)
Resume Xarelto tomorrow as long as no bleeding issues from right wrist.

## 2021-01-10 NOTE — Interval H&P Note (Signed)
History and Physical Interval Note:  01/10/2021 9:42 AM  Andres Judith Part Sr.  has presented today for surgery, with the diagnosis of LT Heart Cath   Ventricular tachycardia   Coronary Artery Disease Per [facility] pt arrive 7a for hydration.  The various methods of treatment have been discussed with the patient and family. After consideration of risks, benefits and other options for treatment, the patient has consented to  Procedure(s): LEFT HEART CATH AND CORONARY ANGIOGRAPHY (N/A) as a surgical intervention.  The patient's history has been reviewed, patient examined, no change in status, stable for surgery.  I have reviewed the patient's chart and labs.  Questions were answered to the patient's satisfaction.     Kathlyn Sacramento

## 2021-01-11 ENCOUNTER — Telehealth: Payer: Self-pay | Admitting: Cardiovascular Disease

## 2021-01-11 NOTE — Telephone Encounter (Signed)
Spoke with the patient. Confirmed that he should be taking Lasix 20 mg every other day.  Patient verbalized understanding and voiced appreciation for the call back.

## 2021-01-11 NOTE — Telephone Encounter (Signed)
Pt c/o medication issue:  1. Name of Medication: furosemide   2. How are you currently taking this medication (dosage and times per day)? Not sure   3. Are you having a reaction (difficulty breathing--STAT)? no  4. What is your medication issue? Patient calling to clarify what dose to take as his old and new bottles are different

## 2021-01-23 ENCOUNTER — Other Ambulatory Visit: Payer: Self-pay | Admitting: Internal Medicine

## 2021-01-24 ENCOUNTER — Other Ambulatory Visit: Payer: Self-pay | Admitting: *Deleted

## 2021-01-24 MED ORDER — AMIODARONE HCL 200 MG PO TABS
200.0000 mg | ORAL_TABLET | Freq: Every day | ORAL | 1 refills | Status: DC
Start: 2021-01-24 — End: 2021-01-25

## 2021-01-24 NOTE — Telephone Encounter (Signed)
Please advise on maintenance dose directions. Thank you!

## 2021-01-24 NOTE — Progress Notes (Signed)
Amiodarone refill- Dose decrease to 200 mg once daily

## 2021-01-25 ENCOUNTER — Encounter: Payer: Self-pay | Admitting: Cardiovascular Disease

## 2021-01-25 ENCOUNTER — Ambulatory Visit: Payer: Medicare Other | Admitting: Cardiovascular Disease

## 2021-01-25 ENCOUNTER — Other Ambulatory Visit: Payer: Self-pay

## 2021-01-25 VITALS — BP 118/60 | HR 62 | Ht 70.0 in | Wt 221.4 lb

## 2021-01-25 DIAGNOSIS — I472 Ventricular tachycardia, unspecified: Secondary | ICD-10-CM

## 2021-01-25 DIAGNOSIS — I5022 Chronic systolic (congestive) heart failure: Secondary | ICD-10-CM | POA: Diagnosis not present

## 2021-01-25 DIAGNOSIS — I482 Chronic atrial fibrillation, unspecified: Secondary | ICD-10-CM

## 2021-01-25 DIAGNOSIS — I2510 Atherosclerotic heart disease of native coronary artery without angina pectoris: Secondary | ICD-10-CM | POA: Diagnosis not present

## 2021-01-25 DIAGNOSIS — E785 Hyperlipidemia, unspecified: Secondary | ICD-10-CM | POA: Diagnosis not present

## 2021-01-25 MED ORDER — AMIODARONE HCL 200 MG PO TABS: 200.0000 mg | ORAL_TABLET | Freq: Every day | ORAL | 1 refills | Status: DC

## 2021-01-25 NOTE — Patient Instructions (Signed)
Medication Instructions:  Your physician recommends that you continue on your current medications as directed. Please refer to the Current Medication list given to you today.  A refill for Amiodarone has been sent to your mail order pharmacy [facility].  *If you need a refill on your cardiac medications before your next appointment, please call your pharmacy*   Lab Work: None ordered If you have labs (blood work) drawn today and your tests are completely normal, you will receive your results only by: [name] MyChart Message (if you have MyChart) OR . A paper copy in the mail If you have any lab test that is abnormal or we need to change your treatment, we will call you to review the results.   Testing/Procedures: None ordered   Follow-Up: At [facility], you and your health needs are our priority.  As part of our continuing mission to provide you with exceptional heart care, we have created designated Provider Care Teams.  These Care Teams include your primary Cardiologist (physician) and Advanced Practice Providers (APPs -  Physician Assistants and Nurse Practitioners) who all work together to provide you with the care you need, when you need it.  We recommend signing up for the patient portal called "MyChart".  Sign up information is provided on this After Visit Summary.  MyChart is used to connect with patients for Virtual Visits (Telemedicine).  Patients are able to view lab/test results, encounter notes, upcoming appointments, etc.  Non-urgent messages can be sent to your provider as well.   To learn more about what you can do with MyChart, go to [email or web address].    Your next appointment:   6 month(s)  The format for your next appointment:   In Person  Provider:   You may see Kathlyn Sacramento, MD or one of the following Advanced Practice Providers on your designated Care Team:    Murray Hodgkins, NP  Christell Faith, PA-C  Marrianne Mood, PA-C  Cadence Fayette,  [place]  Laurann Montana, NP    Other Instructions N/A

## 2021-01-25 NOTE — Progress Notes (Signed)
Cardiology Office Note   Date:  01/25/2021   ID:  Andres Gandy Sr., DOB May 28, 1941, MRN [id number]  PCP:  [name], MD  Cardiologist:   Kathlyn Sacramento, MD   Chief Complaint  Patient presents with  . 2 week follow up     s/p cardiac cath 01/10/2021. "doing well." Medications reviewed by the patient verbally.        History of Present Illness: Andres MOORER Sr. is a 80 y.o. male who presents for a follow up regarding chronic systolic heart failure, chronic atrial fibrillation and coronary artery disease.   He had previous one-vessel CABG in 1994 after failed angioplasty on the left circumflex complicated by dissection.   Cardiac catheterization in December 2016 showed occluded SVG to OM 3. The mid left circumflex had diffuse 90% disease into a small OM3 which was also diffusely diseased and relatively small in size. EF was 15% with mildly elevated LVEDP. I recommended medical therapy. He had previous ventricular tachycardia and syncope that was successfully treated with ICD shock with subsequent adjustment of his device to deliver ATP.   He had ICD generator replacement in April 2019. Most recent echocardiogram in July 2021 showed an EF of 20 to 25% with moderate tricuspid regurgitation.  He was hospitalized in June 2021 with syncope and was found to have ventricular tachycardia treated with ATP.  CT scan showed subdural hematoma which necessitated holding Xarelto at that time.  At that time, he was transitioned to [employer] from ACE inhibitor.  Digoxin was added in September due to episodes of A. fib with RVR.  In addition, the dose of Toprol was increased to 25 mg twice daily.  The patient had recurrent ventricular tachycardia and was seen by Dr. Caryl Comes.  He was started on amiodarone and was referred for cardiac catheterization to exclude an ischemic etiology.  Cardiac catheterization was performed by me 2 weeks ago which showed significant underlying one-vessel coronary  artery disease affecting the mid to distal left circumflex into OM 3 with chronically occluded SVG to OM 3 and stable moderate mid LAD disease.  Overall, no significant change from most recent cardiac catheterization in 2016.  LVEDP was normal.  I recommended medical therapy.  He has been doing well with no chest pain, or worsening dyspnea.  No palpitations.  Past Medical History:  Diagnosis Date  . Arthritis   . Atrial fibrillation, persistent-long-term   . Chronic systolic congestive heart failure ([place])    a. 03/2015 Echo: EF 30-35%, diff HK, mild MR, mod dil LA, mildly dil RA, mild TR.  [name] Coronary artery disease    a. 1994 s/p CABG x 1 (VG->OM3);  b. 10/2015 Cath: LM nl, LAD min irregs, D1 nl, D2 min irregs, LCX 45m, OM2 nl, OM3 90 (small territory), VG->OM3 100, RCA min irregs, RPDA/RPL1/RPL2/RPL3 nl, EF 25%-->Med Rx.  [name] Dysrhythmia   . Hx of squamous cell carcinoma of skin 06/04/2013   L upper arm  . Hyperlipidemia   . ICD (implantable cardiac defibrillator) in place    a. 02/2012 s/p SJM [id number] Fortify Assurance single lead AICD.  [name] Mixed Ischemic and Non-ischemic Cardiomyopathy    a. 07/2012 s/p SJM AICD (RV lead only);  b. 03/2015 Echo: EF 30-35%, diff HK;  c. EF 25% by LV gram.  . Nephrolithiasis 1970's  . Polymyalgia rheumatica (HCC)    On steroids  . Sleep apnea    On CPAP  . 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 Type II diabetes mellitus ([place])   . Ventricular tachycardia -inducible at EP testing    a. 07/2012 s/ SJM AICD.    Past Surgical History:  Procedure Laterality Date  . basel cell removal     left arm  . CARDIAC CATHETERIZATION  02/28/2012   Minor disease in LAD and RCA, LCX: 70% mid and occluded distally. Patent SVG to OM3  . CARDIAC CATHETERIZATION N/A 11/08/2015   Procedure: Left Heart Cath and Cors/Grafts Angiography;  Surgeon: [name], MD;  Location: [place] [facility];  Service: Cardiovascular;  Laterality:  N/A;  . CARDIOVERSION  03/01/2012   Procedure: CARDIOVERSION;  Surgeon: Deboraha Sprang, MD;  Location: [place];  Service: Cardiovascular;  Laterality: N/A;  . CATARACT EXTRACTION W/ INTRAOCULAR LENS  IMPLANT, BILATERAL  2011  . CORONARY ARTERY BYPASS GRAFT  1994   [place] ; CABG X1  . ELECTROPHYSIOLOGY STUDY N/A 02/28/2012   Procedure: ELECTROPHYSIOLOGY STUDY;  Surgeon: Deboraha Sprang, MD;  Location: [facility];  Service: Cardiovascular;  Laterality: N/A;  . ICD GENERATOR CHANGEOUT N/A 03/18/2018   Procedure: Guys;  Surgeon: Evans Lance, MD;  Location: [place] [facility];  Service: Cardiovascular;  Laterality: N/A;  . INSERT / REPLACE / REMOVE PACEMAKER  02/28/12   "w/defibrillator"  . JOINT REPLACEMENT    . KNEE ARTHROSCOPY  2012   right  . LEFT HEART CATH AND CORONARY ANGIOGRAPHY N/A 01/10/2021   Procedure: LEFT HEART CATH AND CORONARY ANGIOGRAPHY;  Surgeon: [name], MD;  Location: [place] [facility];  Service: Cardiovascular;  Laterality: N/A;  . LEFT HEART CATHETERIZATION WITH CORONARY/GRAFT ANGIOGRAM N/A 02/28/2012   Procedure: LEFT HEART CATHETERIZATION WITH [name];  Surgeon: [name], MD;  Location: [place] [facility];  Service: Cardiovascular;  Laterality: N/A;  . TOTAL KNEE ARTHROPLASTY  2011   left     Current Outpatient Medications  Medication Sig Dispense Refill  . amiodarone (PACERONE) 200 MG tablet Take 1 tablet (200 mg total) by mouth daily. 90 tablet 1  . Cinnamon 500 MG TABS Take 1,000 mg by mouth 2 (two) times daily.     . Coenzyme Q10 (CO Q 10 PO) Take 300 mg by mouth daily.    . digoxin (LANOXIN) 0.125 MG tablet Take 0.5 tablets (0.0625 mg total) by mouth daily.    . diphenhydramine-acetaminophen (TYLENOL PM) 25-500 MG TABS tablet Take 2 tablets by mouth at bedtime as needed.    [name] ENTRESTO 24-26 MG TAKE 1 TABLET BY MOUTH TWICE DAILY 180 tablet 2  . eplerenone (INSPRA) 25 MG tablet TAKE 1/2 TABLET BY MOUTH EVERY DAY 45 tablet  2  . fluticasone (FLONASE) 50 MCG/ACT nasal spray Place 1 spray into the nose daily.    . folic acid (FOLVITE) 1 MG tablet Take 1 mg by mouth daily.     . furosemide (LASIX) 20 MG tablet Take 1 tablet (20 mg total) by mouth every other day. 90 tablet 1  . Garlic 0973 MG CAPS Take 1 capsule by mouth daily.     . Glucosamine-Chondroit-Vit C-Mn (GLUCOSAMINE CHONDR 1500 COMPLX PO) Take 1,500 mg by mouth 2 (two) times daily.     . insulin NPH Human (HUMULIN N,NOVOLIN N) 100 UNIT/ML injection Inject 95 Units into the skin at bedtime.     . insulin regular (NOVOLIN R,HUMULIN R) 100 units/mL injection Inject 30-40 Units into the skin 3 (three) times daily  before meals.    [name] ipratropium (ATROVENT) 0.03 % nasal spray Place 1 spray into both nostrils 3 (three) times daily as needed.    [name] levothyroxine (SYNTHROID, LEVOTHROID) 50 MCG tablet Take 50 mcg by mouth daily before breakfast.     . meclizine (ANTIVERT) 25 MG tablet Take 25 mg by mouth 3 (three) times daily as needed for dizziness.    . metFORMIN (GLUCOPHAGE) 500 MG tablet Take 500 mg tablet in the am with 0.5 tablet in the pm.    . methotrexate (RHEUMATREX) 2.5 MG tablet Take 5 mg tablets on Wednesday.Caution:Chemotherapy. Protect from light.    . metoprolol succinate (TOPROL-XL) 25 MG 24 hr tablet Take 1 tablet (25 mg total) by mouth every evening. 90 tablet 3  . Omega-3 Fatty Acids (FISH OIL PO) Take by mouth 2 (two) times daily.     . rivaroxaban (XARELTO) 20 MG TABS tablet Take 1 tablet by mouth daily with breakfast.    . rosuvastatin (CRESTOR) 5 MG tablet Take 5 mg alternating with 10 mg daily     No current facility-administered medications for this visit.    Allergies:   Patient has no known allergies.    Social History:  The patient  reports that he quit smoking about 33 years ago. His smoking use included cigarettes. He has a 25.00 pack-year smoking history. He has never used smokeless tobacco. He reports that he does not drink alcohol  and does not use drugs.   Family History:  The patient's family history includes Heart attack in his brother and father.    ROS:  Please see the history of present illness.   Otherwise, review of systems are positive for none.   All other systems are reviewed and negative.    PHYSICAL EXAM: VS:  Ht 5\' 10"  (1.778 m)   Wt 221 lb 6 oz (100.4 kg)   SpO2 97%   BMI 31.76 kg/m  , BMI Body mass index is 31.76 kg/m. GEN: Well nourished, well developed, in no acute distress  HEENT: normal  Neck: no JVD, carotid bruits, or masses Cardiac: Irregularly irregular; no murmurs, rubs, or gallops,no edema  Respiratory:  clear to auscultation bilaterally, normal work of breathing GI: soft, nontender, nondistended, + BS MS: no deformity or atrophy  Skin: warm and dry, no rash Neuro:  Strength and sensation are intact Psych: euthymic mood, full affect Right radial pulses normal with no hematoma.  EKG:  EKG is ordered today. The ekg ordered today demonstrates atrial fibrillation with low voltage and poor R wave progression in the anterior leads.  Recent Labs: 05/27/2020: TSH 1.691 11/25/2020: Magnesium 1.9 12/28/2020: BUN 24; Creatinine, Ser 1.53; Hemoglobin 16.3; Platelets 210; Potassium 5.1; Sodium 138    Lipid Panel No results found for: CHOL, TRIG, HDL, CHOLHDL, VLDL, LDLCALC, LDLDIRECT    Wt Readings from Last 3 Encounters:  01/25/21 221 lb 6 oz (100.4 kg)  01/10/21 211 lb (95.7 kg)  12/28/20 212 lb (96.2 kg)      ASSESSMENT AND PLAN:  1.  Chronic systolic heart failure: Most recent ejection fraction was 20-25 %..  He appears to be euvolemic on furosemide 20 mg every other day.  LVEDP was normal on recent cardiac catheterization. Continue treatment with carvedilol, Entresto and eplerenone.   2. Coronary artery disease: Mainly affecting the left circumflex supplying a small territory.  Continue medical therapy.  Recent cardiac catheterization showed stable coronary anatomy.  3.  Chronic atrial fibrillation: Continue rate control with digoxin and  Toprol.. Continue anticoagulation with Xarelto.    4. Diabetes mellitus:  Managed by primary care physician  5. Hyperlipidemia: He is not able to tolerate higher doses of statins but is able to take small dose rosuvastatin.  He takes 10 mg alternating with 5 mg.  6.  Recurrent ventricular tachycardia: The patient has an ICD in place.  He is now on amiodarone 200 mg once daily which should be continued.   Disposition:   FU with me in 6 months  Signed,  Kathlyn Sacramento, MD  01/25/2021 3:43 PM    [facility]

## 2021-02-01 ENCOUNTER — Telehealth: Payer: Self-pay | Admitting: Cardiovascular Disease

## 2021-02-01 MED ORDER — ENTRESTO 24-26 MG PO TABS: 1.0000 | ORAL_TABLET | Freq: Two times a day (BID) | ORAL | 2 refills | Status: DC

## 2021-02-01 MED ORDER — METOPROLOL SUCCINATE ER 25 MG PO TB24: 25.0000 mg | ORAL_TABLET | Freq: Every evening | ORAL | 3 refills | Status: DC

## 2021-02-01 MED ORDER — EPLERENONE 25 MG PO TABS: 12.5000 mg | ORAL_TABLET | Freq: Every day | ORAL | 2 refills | Status: DC

## 2021-02-01 MED ORDER — DIGOXIN 125 MCG PO TABS: 0.0625 mg | ORAL_TABLET | Freq: Every day | ORAL | 2 refills | Status: DC

## 2021-02-01 NOTE — Telephone Encounter (Signed)
Called and spoke with patient, informed patient a prescription for this medication was sent to [facility] on 01/25/2021 and also to his local [facility] in February. Patient agreed to call [facility] to get the status of this delivery and call [facility] to transfer local prescription from [facility].  While on the phone, patient request all medications prescribed by Dr. Caryl Comes be sent to [facility] because they are cheaper. The following medications were sent to [facility] per patient's request:  Entresto Metoprolol Digoxin  Eplerenone.

## 2021-02-01 NOTE — Telephone Encounter (Signed)
Patient calling States that [facility] does not have prescription and patient is out  Would like it sent to local pharmacy   *STAT* If patient is at the pharmacy, call can be transferred to refill team.   1. Which medications need to be refilled? (please list name of each medication and dose if known) amiodarone 200 MG 1 tablet daily  2. Which pharmacy/location (including street and city if local pharmacy) is medication to be sent to? [facility]  3. Do they need a 30 day or 90 day supply?

## 2021-02-11 DIAGNOSIS — N189 Chronic kidney disease, unspecified: Secondary | ICD-10-CM | POA: Insufficient documentation

## 2021-02-14 ENCOUNTER — Telehealth: Payer: Self-pay | Admitting: Cardiovascular Disease

## 2021-02-14 NOTE — Telephone Encounter (Signed)
   Primary Cardiologist: Kathlyn Sacramento, MD  Chart reviewed as part of pre-operative protocol coverage.  Definitely need more details. Callback, please help! Thank you!   Charlie Pitter, PA-C 02/14/2021, 4:27 PM

## 2021-02-14 NOTE — Telephone Encounter (Unsigned)
   [facility] Pre-operative Risk Assessment    HEARTCARE STAFF: - Please ensure there is not already an duplicate clearance open for this procedure. - Under Visit Info/Reason for Call, type in Other and utilize the format Clearance MM/DD/YY or Clearance TBD. Do not use dashes or single digits. - If request is for dental extraction, please clarify the # of teeth to be extracted.  Request for surgical clearance:  1. What type of surgery is being performed? Not listed  2. When is this surgery scheduled? tbd  3. What type of clearance is required (medical clearance vs. Pharmacy clearance to hold med vs. Both)? NOT LISTED, NEED ECHO, PATIENT SCHEDULED 03/04/21  4. Are there any medications that need to be held prior to surgery and how long? NOT LISTED   5. Practice name and name of physician performing surgery? [facility]  6. What is the office phone number? [phone]   7.   What is the office fax number? [phone]  8.   Anesthesia type (None, local, MAC, general) ? ***   Elissa Hefty 02/14/2021, 2:41 PM  _________________________________________________________________   (provider comments below)

## 2021-02-14 NOTE — Telephone Encounter (Signed)
Left message for [facility] dept to please fax over new clearance form with complete information, need procedure, type of anesthesia to be used if know if not then choice.

## 2021-02-15 ENCOUNTER — Other Ambulatory Visit: Payer: Self-pay

## 2021-02-15 DIAGNOSIS — Z01818 Encounter for other preprocedural examination: Secondary | ICD-10-CM

## 2021-02-15 DIAGNOSIS — I5022 Chronic systolic (congestive) heart failure: Secondary | ICD-10-CM

## 2021-02-15 NOTE — Telephone Encounter (Signed)
Called the requesting office and spoke with the surgery scheduler. She stated that is not a cardiac clearance that is needed that are needing an Echo done and patient wants done closer to home.   Procedure is Endonasal Tarsal dacroyochystorhinostomy   Anesthesia: General   Requesting office wants to have an ECHO for current EF. Patient wants the ECHO performed at our office due to being closer to his home. Surgery scheduler stated that their office is wanting to know his current EF and if the EF is less than 20-25% the procedure will have to be scheduled at the main hospital which is far from patient's home address and due to more Cardiologist will be on site if needed.

## 2021-02-15 NOTE — Progress Notes (Signed)
Echo order for upcoming procedure

## 2021-02-15 NOTE — Telephone Encounter (Signed)
Due to patient request and request for current EF prior to Endonasal Tarsal dacroyochystorhinostomy by [facility] anesthesiology, we will order echocardiogram.  This will help them better prepare for his upcoming surgery and allow for convenience of the patient.  We will forward results once echocardiogram has been completed.  Andres [name]. [name] NP-C    02/15/2021, 12:52 PM [place] [address] Office [phone] Fax [phone]

## 2021-02-15 NOTE — Telephone Encounter (Addendum)
Please contact requesting office and have them send over details for procedure being done, surgery day, and any other information they may be able to provide.  At that time we will proceed with echocardiogram.  Thank you.

## 2021-02-15 NOTE — Telephone Encounter (Signed)
[facility] stated they need a ECHO done.

## 2021-02-15 NOTE — Telephone Encounter (Signed)
Placed order for ECHO and called the requesting office to make aware

## 2021-02-21 ENCOUNTER — Ambulatory Visit (INDEPENDENT_AMBULATORY_CARE_PROVIDER_SITE_OTHER): Payer: Medicare Other

## 2021-02-21 DIAGNOSIS — I5022 Chronic systolic (congestive) heart failure: Secondary | ICD-10-CM

## 2021-02-21 DIAGNOSIS — I255 Ischemic cardiomyopathy: Secondary | ICD-10-CM

## 2021-02-21 LAB — CUP PACEART REMOTE DEVICE CHECK
Battery Remaining Longevity: 76 mo
Battery Remaining Percentage: 73 %
Battery Voltage: 2.98 V
Brady Statistic RV Percent Paced: 17 %
Date Time Interrogation Session: 20220328020015
HighPow Impedance: 72 Ohm
HighPow Impedance: 72 Ohm
Implantable Lead Implant Date: 20130403
Implantable Lead Location: 753860
Implantable Pulse Generator Implant Date: 20190422
Lead Channel Impedance Value: 380 Ohm
Lead Channel Pacing Threshold Amplitude: 1 V
Lead Channel Pacing Threshold Pulse Width: 0.5 ms
Lead Channel Sensing Intrinsic Amplitude: 11.7 mV
Lead Channel Setting Pacing Amplitude: 2.5 V
Lead Channel Setting Pacing Pulse Width: 0.5 ms
Lead Channel Setting Sensing Sensitivity: 0.5 mV
Pulse Gen Serial Number: 9811176

## 2021-02-22 ENCOUNTER — Other Ambulatory Visit: Payer: Self-pay

## 2021-02-22 ENCOUNTER — Ambulatory Visit (INDEPENDENT_AMBULATORY_CARE_PROVIDER_SITE_OTHER): Payer: Medicare Other

## 2021-02-22 DIAGNOSIS — Z01810 Encounter for preprocedural cardiovascular examination: Secondary | ICD-10-CM | POA: Diagnosis not present

## 2021-02-22 DIAGNOSIS — Z01818 Encounter for other preprocedural examination: Secondary | ICD-10-CM

## 2021-02-22 DIAGNOSIS — I5022 Chronic systolic (congestive) heart failure: Secondary | ICD-10-CM | POA: Diagnosis not present

## 2021-02-23 LAB — ECHOCARDIOGRAM COMPLETE
Area-P 1/2: 5.62 cm2
S' Lateral: 5.3 cm
Single Plane A4C EF: 24.4 %

## 2021-02-25 ENCOUNTER — Telehealth: Payer: Self-pay

## 2021-02-25 NOTE — Telephone Encounter (Signed)
Patient made aware of echo results with verbalized understanding.  Patient sts that he needs a copy of the echo results faxed to [facility] for his upcoming eye sugery.  Echo results sent via [place] fax. Fax number retrieved from the 02/14/21  pre-op documentation.

## 2021-02-25 NOTE — Telephone Encounter (Signed)
Called to give the patient echo results. [email or web address]. 

## 2021-02-25 NOTE — Telephone Encounter (Signed)
-----   Message from [name], MD sent at 02/24/2021  5:39 PM EDT ----- Inform patient that echo showed stable LV systolic function with an EF of 25 to 30%.  Continue same medications.

## 2021-02-28 NOTE — Telephone Encounter (Signed)
   Patient's Echo on 02/22/2021 showed LVEF of 25-30% with global hypokinesis and mild to moderate TR. No significant change compared to prior study in 2021. Will route this and full Echo report to requesting surgeon's office.   If full pre-op clearance is needed, please send our office a completed pre-op assessment form. Please call with any questions.  Darreld Mclean, PA-C 02/28/2021 8:10 AM

## 2021-03-01 ENCOUNTER — Encounter: Payer: Medicare Other | Admitting: Internal Medicine

## 2021-03-04 ENCOUNTER — Other Ambulatory Visit: Payer: Medicare Other

## 2021-03-04 NOTE — Progress Notes (Signed)
Remote ICD transmission.   

## 2021-03-30 ENCOUNTER — Ambulatory Visit: Payer: Medicare Other | Admitting: Dermatology

## 2021-04-07 ENCOUNTER — Ambulatory Visit: Payer: Medicare Other | Admitting: Cardiovascular Disease

## 2021-05-03 ENCOUNTER — Other Ambulatory Visit: Payer: Self-pay | Admitting: Cardiovascular Disease

## 2021-05-03 DIAGNOSIS — I472 Ventricular tachycardia, unspecified: Secondary | ICD-10-CM

## 2021-05-03 DIAGNOSIS — I482 Chronic atrial fibrillation, unspecified: Secondary | ICD-10-CM

## 2021-05-16 ENCOUNTER — Ambulatory Visit: Payer: Medicare Other | Admitting: Dermatology

## 2021-05-16 ENCOUNTER — Other Ambulatory Visit: Payer: Self-pay

## 2021-05-16 DIAGNOSIS — L578 Other skin changes due to chronic exposure to nonionizing radiation: Secondary | ICD-10-CM | POA: Diagnosis not present

## 2021-05-16 DIAGNOSIS — D229 Melanocytic nevi, unspecified: Secondary | ICD-10-CM

## 2021-05-16 DIAGNOSIS — L820 Inflamed seborrheic keratosis: Secondary | ICD-10-CM | POA: Diagnosis not present

## 2021-05-16 DIAGNOSIS — Z1283 Encounter for screening for malignant neoplasm of skin: Secondary | ICD-10-CM | POA: Diagnosis not present

## 2021-05-16 DIAGNOSIS — L918 Other hypertrophic disorders of the skin: Secondary | ICD-10-CM

## 2021-05-16 DIAGNOSIS — L821 Other seborrheic keratosis: Secondary | ICD-10-CM

## 2021-05-16 DIAGNOSIS — L309 Dermatitis, unspecified: Secondary | ICD-10-CM

## 2021-05-16 DIAGNOSIS — L570 Actinic keratosis: Secondary | ICD-10-CM

## 2021-05-16 DIAGNOSIS — Z85828 Personal history of other malignant neoplasm of skin: Secondary | ICD-10-CM

## 2021-05-16 DIAGNOSIS — L814 Other melanin hyperpigmentation: Secondary | ICD-10-CM

## 2021-05-16 DIAGNOSIS — D1800 Hemangioma unspecified site: Secondary | ICD-10-CM

## 2021-05-16 MED ORDER — MOMETASONE FUROATE 0.1 % EX CREA: 1.0000 "application " | TOPICAL_CREAM | Freq: Every day | CUTANEOUS | 0 refills | Status: DC | PRN

## 2021-05-16 NOTE — Patient Instructions (Signed)

## 2021-05-16 NOTE — Progress Notes (Signed)
Follow-Up Visit   Subjective  Andres STEENSON Sr. is a 80 y.o. male who presents for the following: Annual Exam (History of SCC - UBSE today) and Other (Spot on right knee that won't heal). The patient presents for Upper Body Skin Exam (UBSE) for skin cancer screening and mole check.  The following portions of the chart were reviewed this encounter and updated as appropriate:   Tobacco  Allergies  Meds  Problems  Med Hx  Surg Hx  Fam Hx      Review of Systems:  No other skin or systemic complaints except as noted in HPI or Assessment and Plan.  Objective  Well appearing patient in no apparent distress; mood and affect are within normal limits.  A focused examination was performed including upper body and right leg. Relevant physical exam findings are noted in the Assessment and Plan.  Right knee x 1, left antecubital x 1, left neck x 1, left forearm x 1 (4) Erythematous keratotic or waxy stuck-on papule or plaque.   Left Ear x 1, right temple x 1 (2) Erythematous thin papules/macules with gritty scale.   Left Upper Arm - Anterior Pink patch   Assessment & Plan   Lentigines - Scattered tan macules - Due to sun exposure - Benign-appering, observe - Recommend daily broad spectrum sunscreen SPF 30+ to sun-exposed areas, reapply every 2 hours as needed. - Call for any changes  Seborrheic Keratoses - Stuck-on, waxy, tan-brown papules and/or plaques  - Benign-appearing - Discussed benign etiology and prognosis. - Observe - Call for any changes  Melanocytic Nevi - Tan-brown and/or pink-flesh-colored symmetric macules and papules - Benign appearing on exam today - Observation - Call clinic for new or changing moles - Recommend daily use of broad spectrum spf 30+ sunscreen to sun-exposed areas.   Hemangiomas - Red papules - Discussed benign nature - Observe - Call for any changes  Actinic Damage - Chronic condition, secondary to cumulative UV/sun  exposure - diffuse scaly erythematous macules with underlying dyspigmentation - Recommend daily broad spectrum sunscreen SPF 30+ to sun-exposed areas, reapply every 2 hours as needed.  - Staying in the shade or wearing long sleeves, sun glasses (UVA+UVB protection) and wide brim hats (4-inch brim around the entire circumference of the hat) are also recommended for sun protection.  - Call for new or changing lesions.  Skin cancer screening performed today.  Acrochordons (Skin Tags) - Fleshy, skin-colored pedunculated papules - Benign appearing.  - Observe. - If desired, they can be removed with an in office procedure that is not covered by insurance. - Please call the clinic if you notice any new or changing lesions.  Inflamed seborrheic keratosis Right knee x 1, left antecubital x 1, left neck x 1, left forearm x 1  Destruction of lesion - Right knee x 1, left antecubital x 1, left neck x 1, left forearm x 1 Complexity: simple   Destruction method: cryotherapy   Informed consent: discussed and consent obtained   Timeout:  patient name, date of birth, surgical site, and procedure verified Lesion destroyed using liquid nitrogen: Yes   Region frozen until ice ball extended beyond lesion: Yes   Outcome: patient tolerated procedure well with no complications   Post-procedure details: wound care instructions given    AK (actinic keratosis) (2) Left Ear x 1, right temple x 1  Destruction of lesion - Left Ear x 1, right temple x 1 Complexity: simple   Destruction method: cryotherapy  Informed consent: discussed and consent obtained   Timeout:  patient name, date of birth, surgical site, and procedure verified Lesion destroyed using liquid nitrogen: Yes   Region frozen until ice ball extended beyond lesion: Yes   Outcome: patient tolerated procedure well with no complications   Post-procedure details: wound care instructions given    Eczema, Left Upper Arm - Anterior Eczema vs bite  reaction Start Mometasone cream qd-bid prn Atopic dermatitis (eczema) is a chronic, relapsing, pruritic condition that can significantly affect quality of life. It is often associated with allergic rhinitis and/or asthma and can require treatment with topical medications, phototherapy, or in severe cases a biologic medication called Dupixent in children and adults.   mometasone (ELOCON) 0.1 % cream - Left Upper Arm - Anterior Apply 1 application topically daily as needed (Rash).  Skin cancer screening  Return in about 6 months (around 11/15/2021).  I, Ashok Cordia, CMA, am acting as scribe for Sarina Ser, MD .  Documentation: I have reviewed the above documentation for accuracy and completeness, and I agree with the above.  Sarina Ser, MD

## 2021-05-19 ENCOUNTER — Encounter: Payer: Self-pay | Admitting: Dermatology

## 2021-05-23 ENCOUNTER — Ambulatory Visit (INDEPENDENT_AMBULATORY_CARE_PROVIDER_SITE_OTHER): Payer: Medicare Other

## 2021-05-23 DIAGNOSIS — I255 Ischemic cardiomyopathy: Secondary | ICD-10-CM | POA: Diagnosis not present

## 2021-05-24 LAB — CUP PACEART REMOTE DEVICE CHECK
Battery Remaining Longevity: 74 mo
Battery Remaining Percentage: 71 %
Battery Voltage: 2.96 V
Brady Statistic RV Percent Paced: 16 %
Date Time Interrogation Session: 20220627020017
HighPow Impedance: 75 Ohm
HighPow Impedance: 75 Ohm
Implantable Lead Implant Date: 20130403
Implantable Lead Location: 753860
Implantable Pulse Generator Implant Date: 20190422
Lead Channel Impedance Value: 380 Ohm
Lead Channel Pacing Threshold Amplitude: 1 V
Lead Channel Pacing Threshold Pulse Width: 0.5 ms
Lead Channel Sensing Intrinsic Amplitude: 11.7 mV
Lead Channel Setting Pacing Amplitude: 2.5 V
Lead Channel Setting Pacing Pulse Width: 0.5 ms
Lead Channel Setting Sensing Sensitivity: 0.5 mV
Pulse Gen Serial Number: 9811176

## 2021-05-31 ENCOUNTER — Ambulatory Visit (INDEPENDENT_AMBULATORY_CARE_PROVIDER_SITE_OTHER): Payer: Medicare Other | Admitting: Internal Medicine

## 2021-05-31 ENCOUNTER — Other Ambulatory Visit: Payer: Self-pay

## 2021-05-31 ENCOUNTER — Encounter: Payer: Self-pay | Admitting: Internal Medicine

## 2021-05-31 VITALS — BP 118/70 | HR 54 | Ht 70.0 in | Wt 215.8 lb

## 2021-05-31 DIAGNOSIS — I5022 Chronic systolic (congestive) heart failure: Secondary | ICD-10-CM | POA: Diagnosis not present

## 2021-05-31 DIAGNOSIS — Z95810 Presence of automatic (implantable) cardiac defibrillator: Secondary | ICD-10-CM

## 2021-05-31 DIAGNOSIS — I472 Ventricular tachycardia, unspecified: Secondary | ICD-10-CM

## 2021-05-31 DIAGNOSIS — I4821 Permanent atrial fibrillation: Secondary | ICD-10-CM

## 2021-05-31 DIAGNOSIS — Z79899 Other long term (current) drug therapy: Secondary | ICD-10-CM

## 2021-05-31 DIAGNOSIS — I255 Ischemic cardiomyopathy: Secondary | ICD-10-CM | POA: Diagnosis not present

## 2021-05-31 MED ORDER — ENTRESTO 24-26 MG PO TABS: 1.0000 | ORAL_TABLET | Freq: Two times a day (BID) | ORAL | 3 refills | Status: DC

## 2021-05-31 MED ORDER — RIVAROXABAN 15 MG PO TABS: 15.0000 mg | ORAL_TABLET | Freq: Two times a day (BID) | ORAL | 3 refills | Status: DC

## 2021-05-31 NOTE — Patient Instructions (Addendum)
Medication Instructions:  - Your physician has recommended you make the following change in your medication:   1) STOP metoprolol  2) DECREASE xarelto to 15 mg- take 1 tablet by mouth once daily  3) CHANGE lasix (furosemide) 20 mg- take 2 tablets (40 mg) once every other day x 3 doses, then resume your normal dosing  4) Entresto has been refilled for you  *If you need a refill on your cardiac medications before your next appointment, please call your pharmacy*   Lab Work: - Your physician recommends that you have lab work today: TSH/ Digoxin  If you have labs (blood work) drawn today and your tests are completely normal, you will receive your results only by: [employer] (if you have [place]) OR A paper copy in the mail If you have any lab test that is abnormal or we need to change your treatment, we will call you to review the results.   Testing/Procedures: - none ordered   Follow-Up: At [facility], you and your health needs are our priority.  As part of our continuing mission to provide you with exceptional heart care, we have created designated Provider Care Teams.  These Care Teams include your primary Cardiologist (physician) and Advanced Practice Providers (APPs -  Physician Assistants and Nurse Practitioners) who all work together to provide you with the care you need, when you need it.  We recommend signing up for the patient portal called "MyChart".  Sign up information is provided on this After Visit Summary.  MyChart is used to connect with patients for Virtual Visits (Telemedicine).  Patients are able to view lab/test results, encounter notes, upcoming appointments, etc.  Non-urgent messages can be sent to your provider as well.   To learn more about what you can do with MyChart, go to [email or web address].    Your next appointment:   6 month(s)  The format for your next appointment:   In Person  Provider:   Virl Axe, MD   Other  Instructions N/a

## 2021-05-31 NOTE — Progress Notes (Signed)
Patient ID: Andres Shutter., male   DOB: 1941-11-05, 80 y.o.   MRN: [id number]        Patient Care Team: [name], MD as PCP - General (Family Medicine) Deboraha Sprang, MD as PCP - Electrophysiology (Cardiology) [name], MD as PCP - Cardiology (Cardiology)   HPI  Andres Gandy Sr. is a 80 y.o. 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 bypass had been  done emergently in [place] generator replacement 4/19 (GT)  Interval syncope assoc with rapid pace terminated VT    Permanent atrial fibrillation on Xarelto without bleeding   Appropriate Therapy yes  VT monomorphic 5/18 Cl 220--240 and 7/21 assoc with syncope and complicated by subdural hematoma Recurrent VT 12/21 monomorphic and treated with ATP successfully Now on amiodarone Patient denies symptoms of GI intolerance, sun sensitivity, neurological symptoms attributable to amiodarone.       He was switched to [facility] for the adjunctive benefit of decreased ventricular tachycardia   Today,the patient denies chest pain, shortness of breath, nocturnal dyspnea, orthopnea or peripheral edema.  There have been no palpitations,and no syncope.  Complains of dizziness and lightheadedness occurs with standing    Not Associated with palpitations but have heat intolerance   Patient denies symptoms of GI intolerance, sun sensitivity, neurological symptoms attributable to amiodarone.     DATE TEST EF%   2011  Echo [facility]  14 %   12/16 [facility]  15 % SVG-OM T; LAD/RCA non obstru  8/20 Echo  30-35% LAE mod  7/21 Echo  20-25%   02/22 [facility]   SVG-OM3 T; CXm-90% LAD/RCA nonobstructive  3/22 Echo  25-30%     Date Cr K TSH LFT Dig Hgb  4/18  1.4 5.2       4/19 0.36 4.2    14.3  5/20 1.3 4.8 (2/20)    15.6  7/21 1.36 4.5    16.0  12/21 1.47 4.1  5.49 (CE)  19 0.6(10/21) 14.9  5/22(CE) 1.9 4.7 9.68(4/22) 15  15.1        Thromboembolic risk factors ( age  -2, HTN-1, DM-1, Vasc disease -1, CHF-1) for a CHADSVASc Score of >=6   Past Medical History:  Diagnosis Date   Arthritis    Atrial fibrillation, persistent-long-term    Chronic systolic congestive heart failure ([place])    a. 03/2015 Echo: EF 30-35%, diff HK, mild MR, mod dil LA, mildly dil RA, mild TR.   Coronary artery disease    a. 1994 s/p CABG x 1 (VG->OM3);  b. 10/2015 Cath: LM nl, LAD min irregs, D1 nl, D2 min irregs, LCX 10m, OM2 nl, OM3 90 (small territory), VG->OM3 100, RCA min irregs, RPDA/RPL1/RPL2/RPL3 nl, EF 25%-->Med Rx.   Dysrhythmia    Hx of squamous cell carcinoma of skin 06/04/2013   L upper arm   Hyperlipidemia    ICD (implantable cardiac defibrillator) in place    a. 02/2012 s/p SJM 1257-40Q Fort[employer] Assurance single lead AICD.   Mixed Ischemic and Non-ischemic Cardiomyopathy    a. 07/2012 s/p SJM AICD (RV lead only);  b. 03/2015 Echo: EF 30-35%, diff HK;  c. EF 25% by LV gram.   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 07/2012 s/ SJM AICD.    Past Surgical History:  Procedure Laterality Date   basel cell removal     left arm   CARDIAC CATHETERIZATION  02/28/2012   Minor disease in LAD and RCA, LCX: 70% mid and occluded distally. Patent SVG to OM3   CARDIAC CATHETERIZATION N/A 11/08/2015   Procedure: Left Heart Cath and Cors/Grafts Angiography;  Surgeon: [name], MD;  Location: [place] [facility];  Service: Cardiovascular;  Laterality: N/A;   CARDIOVERSION  03/01/2012   Procedure: CARDIOVERSION;  Surgeon: Deboraha Sprang, MD;  Location: [facility] OR;  Service: Cardiovascular;  Laterality: N/A;   CATARACT EXTRACTION W/ INTRAOCULAR LENS   IMPLANT, BILATERAL  2011   CORONARY ARTERY BYPASS GRAFT  1994   [place] ; CABG X1   ELECTROPHYSIOLOGY STUDY N/A 02/28/2012   Procedure: ELECTROPHYSIOLOGY STUDY;  Surgeon: Deboraha Sprang, MD;  Location: [facility] CATH LAB;  Service: Cardiovascular;  Laterality: N/A;   ICD GENERATOR CHANGEOUT N/A 03/18/2018   Procedure: [place];  Surgeon: Evans Lance, MD;  Location: [place] [facility];  Service: Cardiovascular;  Laterality: N/A;   INSERT / REPLACE / REMOVE PACEMAKER  02/28/12   "w/defibrillator"   JOINT REPLACEMENT     KNEE ARTHROSCOPY  2012   right   LEFT HEART CATH AND CORONARY ANGIOGRAPHY N/A 01/10/2021   Procedure: LEFT HEART CATH AND CORONARY ANGIOGRAPHY;  Surgeon: [name], MD;  Location: [place] [facility];  Service: Cardiovascular;  Laterality: N/A;   LEFT HEART CATHETERIZATION WITH CORONARY/GRAFT ANGIOGRAM N/A 02/28/2012   Procedure: LEFT HEART CATHETERIZATION WITH [name];  Surgeon: [name], MD;  Location: [facility] CATH LAB;  Service: Cardiovascular;  Laterality: N/A;   TOTAL KNEE ARTHROPLASTY  2011   left    Current Outpatient Medications  Medication Sig Dispense Refill   amiodarone (PACERONE) 200 MG tablet TAKE 1 TABLET BY MOUTH  DAILY 90 tablet 3   Cinnamon 500 MG TABS Take 1,000 mg by mouth 2 (two) times daily.      Coenzyme Q10 (CO Q 10 PO) Take 300 mg by mouth daily.     digoxin (LANOXIN) 0.125 MG tablet Take 0.5 tablets (0.0625 mg total) by mouth daily. 45 tablet 2   diphenhydramine-acetaminophen (TYLENOL PM) 25-500 MG TABS tablet Take 2 tablets by mouth at bedtime as needed.     eplerenone (INSPRA) 25 MG tablet Take 0.5 tablets (12.5 mg total) by mouth daily. 45 tablet 2   fluticasone (FLONASE) 50 MCG/ACT nasal spray Place 1 spray into the nose daily.     folic acid (FOLVITE) 1 MG tablet Take 1 mg by mouth daily.      furosemide (LASIX) 20 MG tablet Take 1 tablet (20 mg total) by mouth every other day. 90 tablet 1   Garlic 9735 MG CAPS  Take 1 capsule by mouth daily.      Glucosamine-Chondroit-Vit C-Mn (GLUCOSAMINE CHONDR 1500 COMPLX PO) Take 1,500 mg by mouth 2 (two) times daily.      insulin NPH Human (HUMULIN N,NOVOLIN N) 100 UNIT/ML injection Inject 95 Units into the skin at bedtime.      insulin regular (NOVOLIN R,HUMULIN R) 100 units/mL injection Inject 30-40 Units into the skin 3 (three) times daily before meals.     ipratropium (ATROVENT) 0.03 % nasal  spray Place 1 spray into both nostrils 3 (three) times daily as needed.     levothyroxine (SYNTHROID, LEVOTHROID) 50 MCG tablet Take 50 mcg by mouth daily before breakfast.      meclizine (ANTIVERT) 25 MG tablet Take 25 mg by mouth 3 (three) times daily as needed for dizziness.     metFORMIN (GLUCOPHAGE) 500 MG tablet Take 500 mg tablet in the am with 0.5 tablet in the pm.     methotrexate (RHEUMATREX) 2.5 MG tablet Take 5 mg tablets on Wednesday.Caution:Chemotherapy. Protect from light.     metoprolol succinate (TOPROL-XL) 25 MG 24 hr tablet Take 1 tablet (25 mg total) by mouth every evening. 90 tablet 3   mometasone (ELOCON) 0.1 % cream Apply 1 application topically daily as needed (Rash). 45 g 0   Omega-3 Fatty Acids (FISH OIL PO) Take by mouth 2 (two) times daily.      rivaroxaban (XARELTO) 20 MG TABS tablet Take 1 tablet by mouth daily with breakfast.     rosuvastatin (CRESTOR) 5 MG tablet Take 5 mg alternating with 10 mg daily     sacubitril-valsartan (ENTRESTO) 24-26 MG Take 1 tablet by mouth 2 (two) times daily. 180 tablet 2   No current facility-administered medications for this visit.    No Known Allergies  Physical Exam: BP 118/70   Pulse (!) 54   Ht 5\' 10"  (1.778 m)   Wt 215 lb 12.8 oz (97.9 kg)   SpO2 97%   BMI 30.96 kg/m  Well developed and well nourished in no acute distress HENT normal Neck supple with JVP-6 to 7 Lungs Clear Device pocket well healed; without hematoma or erythema.  There is no tethering  Regular rate and rhythm, No gallop and  No  murmur Abd-soft with active BS No Clubbing cyanosis No edema Skin-warm and dry A & Oriented  Grossly normal sensory and motor function  ECG: Atrial fibrillation 54 Intervals-/13/46   Assessment and  Plan:  Congestive heart failure-chronic-systolic  Atrial fibrillation-permanent  Cardiomyopathy ischemic/nonischemic  Orthostatic intolerance-improved  High Risk Medication Surveillance Dig/AMIO   Ventricular tachycardia -monomorphic-rapid  ICD single chamber-St. Jude   Bradycardia  Renal insufficiency grade 3 (estimated GFR 43)  No interval ventricular tachycardia.  We will continue the amiodarone At 200 mg daily. [facility] laboratories have been normal apart from his TSH.  We will check it again today.  Suspect we will need up titration of his replacement.  Atrial fibrillation is permanent.  I suspect the amiodarone has aggravated his tendency towards bradycardia and so we will discontinue his metoprolol so as to avoid ventricular pacing and to allow for greater heart rate excursion    tolerating his Xarelto without bleeding.  Renal function dictates a decrease in his Xarelto dose from 20--15.  Orthostatic vital signs>>> are unrevealing.  Suggesting some of his orthostasis may be neurological  We will check his digoxin level  Have asked him to discuss SGLT2 as an alternative to Glucophage with his endocrinologist  [place] was elevated.  We will have him increase his furosemide to 40 mg every other day for 3 doses  I,Stephanie Williams,acting as a scribe for Virl Axe, MD.,have documented all relevant documentation on the behalf of Virl Axe, MD,as directed by  Virl Axe, MD while in the presence of Virl Axe, MD.  I, Virl Axe, MD, have reviewed all documentation for this visit. The documentation on 05/31/21 for the exam, diagnosis, procedures, and orders are all accurate and complete.

## 2021-06-01 ENCOUNTER — Telehealth: Payer: Self-pay | Admitting: Internal Medicine

## 2021-06-01 LAB — TSH: TSH: 6.08 u[IU]/mL — ABNORMAL HIGH (ref 0.450–4.500)

## 2021-06-01 LAB — DIGOXIN LEVEL: Digoxin, Serum: 0.5 ng/mL (ref 0.5–0.9)

## 2021-06-01 MED ORDER — RIVAROXABAN 15 MG PO TABS: 15.0000 mg | ORAL_TABLET | Freq: Every day | ORAL | 3 refills | Status: DC

## 2021-06-01 NOTE — Telephone Encounter (Signed)
The patient had requested that I call [facility] to request they cancel his [employer] with them. He requested we send this to [facility] due to cost.  I called [facility] and spoke with the pharmacist there. They are aware to discontinue sending him the entresto as he is wanting to get this locally.  The also questioned his Xarelto RX that came in yesterday. Dr. Caryl Comes had advised the patient to decrease Xarelto to 15 mg once daily. The sig in the RX defaulted to 15 mg BID.  I have verified with the pharmacy team that the patient should be taking Xarelto 15 mg once daily due to his renal function.   They have adjusted the sig for this RX and will send to the patient.

## 2021-06-08 LAB — T3, FREE: T3, Free: 2.3 pg/mL (ref 2.0–4.4)

## 2021-06-08 LAB — SPECIMEN STATUS REPORT

## 2021-06-08 LAB — T4, FREE: Free T4: 1.28 ng/dL (ref 0.82–1.77)

## 2021-06-13 NOTE — Progress Notes (Signed)
Remote ICD transmission.   

## 2021-06-14 ENCOUNTER — Telehealth: Payer: Self-pay | Admitting: Internal Medicine

## 2021-06-14 NOTE — Telephone Encounter (Signed)
That is fine, pharmacy calling since dixogin is a narrow therapeutic index drug. Last dig level checked a few weeks ago and was stable at 0.5.

## 2021-06-14 NOTE — Telephone Encounter (Signed)
Spoke with Daneil Dan at [facility] and they wanted to verify change in manufacturer and confirmed per our pharmacy team that would be fine. She documented our conversation with no further questions at this time.

## 2021-06-14 NOTE — Telephone Encounter (Signed)
Please call regerding Digoxin. States the manufacturer is changing. Reference number [id number]

## 2021-07-25 ENCOUNTER — Telehealth: Payer: Self-pay | Admitting: Cardiovascular Disease

## 2021-07-25 NOTE — Telephone Encounter (Signed)
Spoke with pt's wife, DPR approved.  Wife wanted to verify 3 medications that were on AVS from pt's endocrinologist at [facility] clinic whether pt should be taking. Enalapril, Metoprolol, Carvedilol.  Reviewed pt's chart and verified that we do not have pt on either of these three medications.  Pt's wife appreciative for clarifying.  Does report that pt does have occasional episodes of dizziness and thinks it is since he started Amiodarone. Pt's BP/HR now while on phone: 126/60 71. Advised pt change positions slowly, stay hydrated well, and check BP/HR next time pt has an "episode" to let us know what his results are. Pt's wife voiced understanding and will contact office with BP/HR readings.  Otherwise, pt is stable at this time. Pt has follow up scheduled 08/23/21.  No further questions at this time.

## 2021-07-25 NOTE — Telephone Encounter (Signed)
Patient's wife would like to review medication list. She states that she is not sure patient is taking the medications that he should be. Please call to discuss

## 2021-08-22 ENCOUNTER — Ambulatory Visit (INDEPENDENT_AMBULATORY_CARE_PROVIDER_SITE_OTHER): Payer: Medicare Other

## 2021-08-22 DIAGNOSIS — I5022 Chronic systolic (congestive) heart failure: Secondary | ICD-10-CM | POA: Diagnosis not present

## 2021-08-22 LAB — CUP PACEART REMOTE DEVICE CHECK
Battery Remaining Longevity: 73 mo
Battery Remaining Percentage: 69 %
Battery Voltage: 2.96 V
Brady Statistic RV Percent Paced: 9.1 %
Date Time Interrogation Session: 20220926020016
HighPow Impedance: 77 Ohm
HighPow Impedance: 77 Ohm
Implantable Lead Implant Date: 20130403
Implantable Lead Location: 753860
Implantable Pulse Generator Implant Date: 20190422
Lead Channel Impedance Value: 390 Ohm
Lead Channel Pacing Threshold Amplitude: 1.25 V
Lead Channel Pacing Threshold Pulse Width: 0.5 ms
Lead Channel Sensing Intrinsic Amplitude: 11.7 mV
Lead Channel Setting Pacing Amplitude: 2.5 V
Lead Channel Setting Pacing Pulse Width: 0.5 ms
Lead Channel Setting Sensing Sensitivity: 0.5 mV
Pulse Gen Serial Number: 9811176

## 2021-08-23 ENCOUNTER — Ambulatory Visit: Payer: Medicare Other | Admitting: Cardiovascular Disease

## 2021-08-23 ENCOUNTER — Other Ambulatory Visit: Payer: Self-pay

## 2021-08-23 VITALS — BP 116/60 | HR 59 | Ht 69.0 in | Wt 210.1 lb

## 2021-08-23 DIAGNOSIS — I472 Ventricular tachycardia, unspecified: Secondary | ICD-10-CM

## 2021-08-23 DIAGNOSIS — E785 Hyperlipidemia, unspecified: Secondary | ICD-10-CM | POA: Diagnosis not present

## 2021-08-23 DIAGNOSIS — I482 Chronic atrial fibrillation, unspecified: Secondary | ICD-10-CM

## 2021-08-23 DIAGNOSIS — I5022 Chronic systolic (congestive) heart failure: Secondary | ICD-10-CM | POA: Diagnosis not present

## 2021-08-23 DIAGNOSIS — I2510 Atherosclerotic heart disease of native coronary artery without angina pectoris: Secondary | ICD-10-CM

## 2021-08-23 NOTE — Patient Instructions (Signed)
Medication Instructions:  Your physician has recommended you make the following change in your medication:   STOP furosemide (Lasix)   Continue your other medications as prescribed.  *If you need a refill on your cardiac medications before your next appointment, please call your pharmacy*   Lab Work: None ordered If you have labs (blood work) drawn today and your tests are completely normal, you will receive your results only by: [place] (if you have MyChart) OR A paper copy in the mail If you have any lab test that is abnormal or we need to change your treatment, we will call you to review the results.   Testing/Procedures: None ordered   Follow-Up: At [facility], you and your health needs are our priority.  As part of our continuing mission to provide you with exceptional heart care, we have created designated Provider Care Teams.  These Care Teams include your primary Cardiologist (physician) and Advanced Practice Providers (APPs -  Physician Assistants and Nurse Practitioners) who all work together to provide you with the care you need, when you need it.  We recommend signing up for the patient portal called "MyChart".  Sign up information is provided on this After Visit Summary.  MyChart is used to connect with patients for Virtual Visits (Telemedicine).  Patients are able to view lab/test results, encounter notes, upcoming appointments, etc.  Non-urgent messages can be sent to your provider as well.   To learn more about what you can do with MyChart, go to [email or web address].    Your next appointment:   6 month(s)  The format for your next appointment:   In Person  Provider:   You may see Kathlyn Sacramento, MD or one of the following Advanced Practice Providers on your designated Care Team:   Murray Hodgkins, NP Christell Faith, PA-C Marrianne Mood, PA-C Cadence Kathlen Mody, [place]   Other Instructions N/A

## 2021-08-23 NOTE — Progress Notes (Signed)
Cardiology Office Note   Date:  08/23/2021   ID:  Andres Gandy Sr., DOB 03-02-1941, MRN [id number]  PCP:  [name], MD  Cardiologist:   Kathlyn Sacramento, MD   Chief Complaint  Andres [name] presents with   Other    6 month f/u c/o dizziness/syncope. Meds reviewed verbally with pt.        History of Present Illness: Andres ARPS Sr. is a 80 y.o. male who presents for a follow up regarding chronic systolic heart failure, chronic atrial fibrillation and coronary artery disease.   He had previous one-vessel CABG in 1994 after failed angioplasty on Andres left circumflex complicated by dissection.   Cardiac catheterization in December 2016 showed occluded SVG to OM 3. Andres mid left circumflex had diffuse 90% disease into a small OM3 which was also diffusely diseased and relatively small in size. EF was 15% with mildly elevated LVEDP. I recommended medical therapy. He had previous ventricular tachycardia and syncope that was successfully treated with ICD shock with subsequent adjustment of his device to deliver ATP.   He had ICD generator replacement in April 2019. Most recent echocardiogram in July 2021 showed an EF of 20 to 25% with moderate tricuspid regurgitation.  He was hospitalized in June 2021 with syncope and was found to have ventricular tachycardia treated with ATP.  CT scan showed subdural hematoma which necessitated holding Xarelto at that time.  At that time, he was transitioned to [facility] from ACE inhibitor.  Digoxin was added in September due to episodes of A. fib with RVR.  In addition, Andres dose of Toprol was increased to 25 mg twice daily.  Andres Andres [name] had ventricular tachycardia early this year and thus underwent cardiac catheterization February 2022 which showed significant underlying one-vessel coronary artery disease affecting Andres mid to distal left circumflex into OM 3 with chronically occluded SVG to OM 3 and stable moderate mid LAD disease.  Overall, no  significant change from most recent cardiac catheterization in 2016.  LVEDP was normal.  He was treated with amiodarone.  He was seen by Dr. Caryl Comes in July and metoprolol was discontinued at that time due to bradycardia and to avoid ventricular pacing.  Jardiance was added by endocrinology.  Over Andres last few weeks, Andres Andres [name] reports worsening orthostatic dizziness and presyncope.  No palpitations.  No chest pain or shortness of breath.  No significant leg edema.  He takes furosemide every other day.  Unfortunately, his wife is dealing with advanced breast cancer.  Past Medical History:  Diagnosis Date   Arthritis    Atrial fibrillation, persistent-long-term    Chronic systolic congestive heart failure ([place])    a. 03/2015 Echo: EF 30-35%, diff HK, mild MR, mod dil LA, mildly dil RA, mild TR.   Coronary artery disease    a. 1994 s/p CABG x 1 (VG->OM3);  b. 10/2015 Cath: LM nl, LAD min irregs, D1 nl, D2 min irregs, LCX 77m, OM2 nl, OM3 90 (small territory), VG->OM3 100, RCA min irregs, RPDA/RPL1/RPL2/RPL3 nl, EF 25%-->Med Rx.   Dysrhythmia    Hx of squamous cell carcinoma of skin 06/04/2013   L upper arm   Hyperlipidemia    ICD (implantable cardiac defibrillator) in place    a. 02/2012 s/p SJM [id number] Fort[employer] Assurance single lead AICD.   Mixed Ischemic and Non-ischemic Cardiomyopathy    a. 07/2012 s/p SJM AICD (RV lead only);  b. 03/2015 Echo: EF 30-35%, diff HK;  c. EF 25% by LV gram.  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 07/2012 s/ SJM AICD.    Past Surgical History:  Procedure Laterality Date   basel cell removal     left arm   CARDIAC CATHETERIZATION  02/28/2012   Minor disease in LAD and RCA, LCX: 70% mid and occluded distally. Patent SVG to OM3    CARDIAC CATHETERIZATION N/A 11/08/2015   Procedure: Left Heart Cath and Cors/Grafts Angiography;  Surgeon: [name], MD;  Location: [facility];  Service: Cardiovascular;  Laterality: N/A;   CARDIOVERSION  03/01/2012   Procedure: CARDIOVERSION;  Surgeon: Deboraha Sprang, MD;  Location: [facility];  Service: Cardiovascular;  Laterality: N/A;   CATARACT EXTRACTION W/ INTRAOCULAR LENS  IMPLANT, BILATERAL  2011   CORONARY ARTERY BYPASS GRAFT  1994   [place] ; CABG X1   ELECTROPHYSIOLOGY STUDY N/A 02/28/2012   Procedure: ELECTROPHYSIOLOGY STUDY;  Surgeon: Deboraha Sprang, MD;  Location: [facility] CATH LAB;  Service: Cardiovascular;  Laterality: N/A;   ICD GENERATOR CHANGEOUT N/A 03/18/2018   Procedure: [place];  Surgeon: Evans Lance, MD;  Location: [facility];  Service: Cardiovascular;  Laterality: N/A;   INSERT / REPLACE / REMOVE PACEMAKER  02/28/12   "w/defibrillator"   JOINT REPLACEMENT     KNEE ARTHROSCOPY  2012   right   LEFT HEART CATH AND CORONARY ANGIOGRAPHY N/A 01/10/2021   Procedure: LEFT HEART CATH AND CORONARY ANGIOGRAPHY;  Surgeon: [name], MD;  Location: [place];  Service: Cardiovascular;  Laterality: N/A;   LEFT HEART CATHETERIZATION WITH CORONARY/GRAFT ANGIOGRAM N/A 02/28/2012   Procedure: LEFT HEART CATHETERIZATION WITH [name];  Surgeon: [name], MD;  Location: [facility] CATH LAB;  Service: Cardiovascular;  Laterality: N/A;   TOTAL KNEE ARTHROPLASTY  2011   left     Current Outpatient Medications  Medication Sig Dispense Refill   amiodarone (PACERONE) 200 MG tablet TAKE 1 TABLET BY MOUTH  DAILY 90 tablet 3   Cinnamon 500 MG TABS Take 1,000 mg by mouth 2 (two) times daily.      Coenzyme Q10 (CO Q 10 PO) Take 300 mg by mouth daily.     digoxin (LANOXIN) 0.125 MG tablet Take 0.5 tablets (0.0625 mg total) by mouth daily. 45 tablet 2   diphenhydramine-acetaminophen (TYLENOL PM) 25-500 MG TABS tablet Take 2 tablets by  mouth at bedtime as needed.     empagliflozin (JARDIANCE) 25 MG TABS tablet Take 1 tablet by mouth daily.     eplerenone (INSPRA) 25 MG tablet Take 0.5 tablets (12.5 mg total) by mouth daily. 45 tablet 2   fluticasone (FLONASE) 50 MCG/ACT nasal spray Place 1 spray into Andres nose daily.     folic acid (FOLVITE) 1 MG tablet Take 1 mg by mouth daily.      furosemide (LASIX) 20 MG tablet Take 1 tablet (20 mg total) by mouth every other day. 90 tablet 1   Garlic 7341 MG CAPS Take 1 capsule by mouth daily.      Glucosamine-Chondroit-Vit C-Mn (GLUCOSAMINE CHONDR 1500 COMPLX PO) Take 1,500 mg by mouth 2 (two) times daily.      insulin NPH Human (HUMULIN N,NOVOLIN N) 100  UNIT/ML injection Inject 95 Units into Andres skin at bedtime.      insulin regular (NOVOLIN R,HUMULIN R) 100 units/mL injection Inject 30-40 Units into Andres skin 3 (three) times daily before meals.     levocetirizine (XYZAL) 5 MG tablet Take 5 mg by mouth every evening.     levothyroxine (SYNTHROID, LEVOTHROID) 50 MCG tablet Take 50 mcg by mouth daily before breakfast.      meclizine (ANTIVERT) 25 MG tablet Take 25 mg by mouth 3 (three) times daily as needed for dizziness.     metFORMIN (GLUCOPHAGE) 500 MG tablet Take 500 mg tablet in Andres am with 0.5 tablet in Andres pm.     methotrexate (RHEUMATREX) 2.5 MG tablet Take 5 mg tablets on Wednesday.Caution:Chemotherapy. Protect from light.     mometasone (ELOCON) 0.1 % cream Apply 1 application topically daily as needed (Rash). 45 g 0   Omega-3 Fatty Acids (FISH OIL PO) Take by mouth 2 (two) times daily.      Rivaroxaban (XARELTO) 15 MG TABS tablet Take 1 tablet (15 mg total) by mouth daily with supper. 90 tablet 3   rosuvastatin (CRESTOR) 10 MG tablet Take 10 mg by mouth daily.     sacubitril-valsartan (ENTRESTO) 24-26 MG Take 1 tablet by mouth 2 (two) times daily. 180 tablet 3   No current facility-administered medications for this visit.    Allergies:   Andres [name] has no known allergies.     Social History:  Andres Andres [name]  reports that he quit smoking about 33 years ago. His smoking use included cigarettes. He has a 25.00 pack-year smoking history. He has never used smokeless tobacco. He reports that he does not drink alcohol and does not use drugs.   Family History:  Andres Andres [name]'s family history includes Heart attack in his brother and father.    ROS:  Please see Andres history of present illness.   Otherwise, review of systems are positive for none.   All other systems are reviewed and negative.    PHYSICAL EXAM: VS:  BP 116/60 (BP Location: Left Arm, Andres [name] Position: Sitting, Cuff Size: Normal)   Pulse (!) 59   Ht 5\' 9"  (1.753 m)   Wt 210 lb 2 oz (95.3 kg)   SpO2 96%   BMI 31.03 kg/m  , BMI Body mass index is 31.03 kg/m. GEN: Well nourished, well developed, in no acute distress  HEENT: normal  Neck: no JVD, carotid bruits, or masses Cardiac: Irregularly irregular; no murmurs, rubs, or gallops,no edema  Respiratory:  clear to auscultation bilaterally, normal work of breathing GI: soft, nontender, nondistended, + BS MS: no deformity or atrophy  Skin: warm and dry, no rash Neuro:  Strength and sensation are intact Psych: euthymic mood, full affect   EKG:  EKG is ordered today. Andres ekg ordered today demonstrates atrial fibrillation with ventricular rate of 59 bpm.  Nonspecific ST and T wave changes.   Recent Labs: 11/25/2020: Magnesium 1.9 12/28/2020: BUN 24; Creatinine, Ser 1.53; Hemoglobin 16.3; Platelets 210; Potassium 5.1; Sodium 138 05/31/2021: TSH 6.080    Lipid Panel No results found for: CHOL, TRIG, HDL, CHOLHDL, VLDL, LDLCALC, LDLDIRECT    Wt Readings from Last 3 Encounters:  08/23/21 210 lb 2 oz (95.3 kg)  05/31/21 215 lb 12.8 oz (97.9 kg)  01/25/21 221 lb 6 oz (100.4 kg)      ASSESSMENT AND PLAN:  1.  Chronic systolic heart failure: Most recent ejection fraction was 20-25 %.  Andres Andres [name] reports increased orthostatic  dizziness since he was  started on Jardiance.  Suspect that this might be related to some volume depletion.  I elected to discontinue furosemide.  He is otherwise on optimal medical therapy including Entresto, eplerenone and Jardiance.  Beta-blockers were discontinued due to bradycardia.    2. Coronary artery disease: Mainly affecting Andres left circumflex supplying a small territory.  Continue medical therapy.  Most recent cardiac catheterization showed stable coronary anatomy.  3. Chronic atrial fibrillation: Continue rate control with digoxin and Toprol.. Continue anticoagulation with Xarelto.    4. Diabetes mellitus:  Managed by endocrinology.  5. Hyperlipidemia: He is not able to tolerate higher doses of statins but is able to take small dose rosuvastatin.  He takes 10 mg alternating with 5 mg.  6.  Recurrent ventricular tachycardia: Andres Andres [name] has an ICD in place.  He is now on amiodarone 200 mg once daily which should be continued.   Disposition:   FU with me in 6 months  Signed,  Kathlyn Sacramento, MD  08/23/2021 3:45 PM    [facility] [facility]

## 2021-08-29 NOTE — Progress Notes (Signed)
Remote ICD transmission.   

## 2021-09-06 DIAGNOSIS — G4730 Sleep apnea, unspecified: Secondary | ICD-10-CM | POA: Insufficient documentation

## 2021-10-27 ENCOUNTER — Other Ambulatory Visit: Payer: Self-pay | Admitting: Internal Medicine

## 2021-10-27 NOTE — Telephone Encounter (Signed)
This is a [place] pt 

## 2021-10-28 ENCOUNTER — Other Ambulatory Visit: Payer: Self-pay | Admitting: Internal Medicine

## 2021-10-31 NOTE — Telephone Encounter (Signed)
Please schedule 6 month F/U appointment with Dr. Caryl Comes. Thank you!

## 2021-10-31 NOTE — Telephone Encounter (Signed)
This is a [place] pt 

## 2021-11-22 ENCOUNTER — Ambulatory Visit (INDEPENDENT_AMBULATORY_CARE_PROVIDER_SITE_OTHER): Payer: Medicare Other

## 2021-11-22 DIAGNOSIS — I5022 Chronic systolic (congestive) heart failure: Secondary | ICD-10-CM | POA: Diagnosis not present

## 2021-11-22 LAB — CUP PACEART REMOTE DEVICE CHECK
Battery Remaining Longevity: 70 mo
Battery Remaining Percentage: 67 %
Battery Voltage: 2.96 V
Brady Statistic RV Percent Paced: 11 %
Date Time Interrogation Session: 20221226020017
HighPow Impedance: 71 Ohm
HighPow Impedance: 71 Ohm
Implantable Lead Implant Date: 20130403
Implantable Lead Location: 753860
Implantable Pulse Generator Implant Date: 20190422
Lead Channel Impedance Value: 390 Ohm
Lead Channel Pacing Threshold Amplitude: 1.25 V
Lead Channel Pacing Threshold Pulse Width: 0.5 ms
Lead Channel Sensing Intrinsic Amplitude: 11.7 mV
Lead Channel Setting Pacing Amplitude: 2.5 V
Lead Channel Setting Pacing Pulse Width: 0.5 ms
Lead Channel Setting Sensing Sensitivity: 0.5 mV
Pulse Gen Serial Number: 9811176

## 2021-12-01 NOTE — Progress Notes (Signed)
Remote ICD transmission.   

## 2021-12-08 ENCOUNTER — Other Ambulatory Visit: Payer: Self-pay

## 2021-12-08 ENCOUNTER — Ambulatory Visit: Payer: Medicare Other | Admitting: Dermatology

## 2021-12-08 ENCOUNTER — Encounter: Payer: Self-pay | Admitting: Dermatology

## 2021-12-08 DIAGNOSIS — Z85828 Personal history of other malignant neoplasm of skin: Secondary | ICD-10-CM | POA: Diagnosis not present

## 2021-12-08 DIAGNOSIS — D1800 Hemangioma unspecified site: Secondary | ICD-10-CM

## 2021-12-08 DIAGNOSIS — L578 Other skin changes due to chronic exposure to nonionizing radiation: Secondary | ICD-10-CM

## 2021-12-08 DIAGNOSIS — Z1283 Encounter for screening for malignant neoplasm of skin: Secondary | ICD-10-CM

## 2021-12-08 DIAGNOSIS — I781 Nevus, non-neoplastic: Secondary | ICD-10-CM | POA: Diagnosis not present

## 2021-12-08 DIAGNOSIS — D229 Melanocytic nevi, unspecified: Secondary | ICD-10-CM

## 2021-12-08 DIAGNOSIS — L820 Inflamed seborrheic keratosis: Secondary | ICD-10-CM | POA: Diagnosis not present

## 2021-12-08 DIAGNOSIS — L814 Other melanin hyperpigmentation: Secondary | ICD-10-CM

## 2021-12-08 DIAGNOSIS — L821 Other seborrheic keratosis: Secondary | ICD-10-CM

## 2021-12-08 DIAGNOSIS — Z872 Personal history of diseases of the skin and subcutaneous tissue: Secondary | ICD-10-CM

## 2021-12-08 NOTE — Progress Notes (Signed)
Follow-Up Visit   Subjective  Andres CARD Sr. is a 81 y.o. male who presents for the following: Upper body skin exam (Hx of SCC L upper arm, hx of AKs) and check spots (Face, irritated by shaving). The patient presents for Upper Body Skin Exam (UBSE) for skin cancer screening and mole check.  The patient has spots, moles and lesions to be evaluated, some may be new or changing and the patient has concerns that these could be cancer.   The following portions of the chart were reviewed this encounter and updated as appropriate:   Tobacco   Allergies   Meds   Problems   Med Hx   Surg Hx   Fam Hx      Review of Systems:  No other skin or systemic complaints except as noted in HPI or Assessment and Plan.  Objective  Well appearing patient in no apparent distress; mood and affect are within normal limits.  All skin waist up examined.  face Blanching red macule         R inf cheek x 3 (3) Stuck on waxy paps with erythema    Assessment & Plan  Telangiectasias with specific lesions on the nose and left infraorbital areas  but confluent areas over the cheeks and nose consistent with Rosacea face  Discussed the treatment option of BBL/laser.  Typically we recommend 1-3 treatment sessions about 5-8 weeks apart for best results.  The patient's condition may require "maintenance treatments" in the future.  The fee for BBL / laser treatments is $350 per treatment session for the whole face or $200 per treatment for telangiectasia on L cheek.  A fee can be quoted for other parts of the body. Insurance typically does not pay for BBL/laser treatments and therefore the fee is an out-of-pocket cost.  Rosacea is a chronic progressive skin condition usually affecting the face of adults, causing redness and/or acne bumps. It is treatable but not curable. It sometimes affects the eyes (ocular rosacea) as well. It may respond to topical and/or systemic medication and can flare with stress,  sun exposure, alcohol, exercise and some foods.  Daily application of broad spectrum spf 30+ sunscreen to face is recommended to reduce flares.  Inflamed seborrheic keratosis (3) R inf cheek x 3  Destruction of lesion - R inf cheek x 3 Complexity: simple   Destruction method: cryotherapy   Informed consent: discussed and consent obtained   Timeout:  patient name, date of birth, surgical site, and procedure verified Lesion destroyed using liquid nitrogen: Yes   Region frozen until ice ball extended beyond lesion: Yes   Outcome: patient tolerated procedure well with no complications   Post-procedure details: wound care instructions given    Skin cancer screening  Lentigines - Scattered tan macules - Due to sun exposure - Benign-appearing, observe - Recommend daily broad spectrum sunscreen SPF 30+ to sun-exposed areas, reapply every 2 hours as needed. - Call for any changes  Seborrheic Keratoses - Stuck-on, waxy, tan-brown papules and/or plaques  - Benign-appearing - Discussed benign etiology and prognosis. - Observe - Call for any changes  Melanocytic Nevi - Tan-brown and/or pink-flesh-colored symmetric macules and papules - Benign appearing on exam today - Observation - Call clinic for new or changing moles - Recommend daily use of broad spectrum spf 30+ sunscreen to sun-exposed areas.   Hemangiomas - Red papules - Discussed benign nature - Observe - Call for any changes  Actinic Damage - Chronic condition, secondary  to cumulative UV/sun exposure - diffuse scaly erythematous macules with underlying dyspigmentation - Recommend daily broad spectrum sunscreen SPF 30+ to sun-exposed areas, reapply every 2 hours as needed.  - Staying in the shade or wearing long sleeves, sun glasses (UVA+UVB protection) and wide brim hats (4-inch brim around the entire circumference of the hat) are also recommended for sun protection.  - Call for new or changing lesions.  Skin cancer  screening performed today.   History of Squamous Cell Carcinoma of the Skin - No evidence of recurrence today - No lymphadenopathy - Recommend regular full body skin exams - Recommend daily broad spectrum sunscreen SPF 30+ to sun-exposed areas, reapply every 2 hours as needed.  - Call if any new or changing lesions are noted between office visits - L upper arm  History of PreCancerous Actinic Keratosis  - site(s) of PreCancerous Actinic Keratosis clear today. - these may recur and new lesions may form requiring treatment to prevent transformation into skin cancer - observe for new or changing spots and contact [facility] for appointment if occur - photoprotection with sun protective clothing; sunglasses and broad spectrum sunscreen with SPF of at least 30 + and frequent self skin exams recommended - yearly exams by a dermatologist recommended for persons with history of PreCancerous Actinic Keratoses   Return in about 6 months (around 06/07/2022) for UBSE, Hx of SCC, Hx of AKs.  I, Othelia Pulling, RMA, am acting as scribe for Sarina Ser, MD . Documentation: I have reviewed the above documentation for accuracy and completeness, and I agree with the above.  Sarina Ser, MD

## 2021-12-08 NOTE — Patient Instructions (Signed)

## 2021-12-11 ENCOUNTER — Encounter: Payer: Self-pay | Admitting: Dermatology

## 2022-01-08 ENCOUNTER — Other Ambulatory Visit: Payer: Self-pay | Admitting: Internal Medicine

## 2022-01-09 NOTE — Telephone Encounter (Signed)
This is a [place] pt 

## 2022-01-10 ENCOUNTER — Encounter: Payer: Medicare Other | Admitting: Internal Medicine

## 2022-01-13 ENCOUNTER — Other Ambulatory Visit: Payer: Self-pay | Admitting: Internal Medicine

## 2022-01-16 ENCOUNTER — Other Ambulatory Visit: Payer: Self-pay | Admitting: Internal Medicine

## 2022-02-20 ENCOUNTER — Ambulatory Visit (INDEPENDENT_AMBULATORY_CARE_PROVIDER_SITE_OTHER): Payer: Medicare Other

## 2022-02-20 DIAGNOSIS — I255 Ischemic cardiomyopathy: Secondary | ICD-10-CM

## 2022-02-20 DIAGNOSIS — I5022 Chronic systolic (congestive) heart failure: Secondary | ICD-10-CM

## 2022-02-22 LAB — CUP PACEART REMOTE DEVICE CHECK
Battery Remaining Longevity: 68 mo
Battery Remaining Percentage: 66 %
Battery Voltage: 2.96 V
Brady Statistic RV Percent Paced: 12 %
Date Time Interrogation Session: 20230327020026
HighPow Impedance: 68 Ohm
HighPow Impedance: 68 Ohm
Implantable Lead Implant Date: 20130403
Implantable Lead Location: 753860
Implantable Pulse Generator Implant Date: 20190422
Lead Channel Impedance Value: 360 Ohm
Lead Channel Pacing Threshold Amplitude: 1.25 V
Lead Channel Pacing Threshold Pulse Width: 0.5 ms
Lead Channel Sensing Intrinsic Amplitude: 11.7 mV
Lead Channel Setting Pacing Amplitude: 2.5 V
Lead Channel Setting Pacing Pulse Width: 0.5 ms
Lead Channel Setting Sensing Sensitivity: 0.5 mV
Pulse Gen Serial Number: 9811176

## 2022-02-23 ENCOUNTER — Encounter: Payer: Self-pay | Admitting: Cardiovascular Disease

## 2022-02-23 ENCOUNTER — Ambulatory Visit: Payer: Medicare Other | Admitting: Cardiovascular Disease

## 2022-02-23 VITALS — BP 120/70 | HR 57 | Ht 70.0 in | Wt 216.0 lb

## 2022-02-23 DIAGNOSIS — I2510 Atherosclerotic heart disease of native coronary artery without angina pectoris: Secondary | ICD-10-CM | POA: Diagnosis not present

## 2022-02-23 DIAGNOSIS — E119 Type 2 diabetes mellitus without complications: Secondary | ICD-10-CM

## 2022-02-23 DIAGNOSIS — E785 Hyperlipidemia, unspecified: Secondary | ICD-10-CM

## 2022-02-23 DIAGNOSIS — I482 Chronic atrial fibrillation, unspecified: Secondary | ICD-10-CM | POA: Diagnosis not present

## 2022-02-23 DIAGNOSIS — I472 Ventricular tachycardia, unspecified: Secondary | ICD-10-CM

## 2022-02-23 DIAGNOSIS — Z794 Long term (current) use of insulin: Secondary | ICD-10-CM

## 2022-02-23 DIAGNOSIS — I5022 Chronic systolic (congestive) heart failure: Secondary | ICD-10-CM

## 2022-02-23 NOTE — Progress Notes (Signed)
?  ?Cardiology Office Note ? ? ?Date:  02/23/2022  ? ?ID:  Andres Gandy Sr., DOB 22-Aug-1941, MRN [id number] ? ?PCP:  [name], MD  ?Cardiologist:   Kathlyn Sacramento, MD  ? ?Chief Complaint  ?Patient presents with  ? Other  ?  6 month f/u no complaints today. Meds reviewed verbally with pt.  ? ? ? ?  ?History of Present Illness: ?Andres GARLITZ Sr. is a 81 y.o. male who presents for a follow up regarding chronic systolic heart failure, chronic atrial fibrillation and coronary artery disease.   ?He had previous one-vessel CABG in 1994 after failed angioplasty on the left circumflex complicated by dissection.   ?Cardiac catheterization in December 2016 showed occluded SVG to OM 3. The mid left circumflex had diffuse 90% disease into a small OM3 which was also diffusely diseased and relatively small in size. EF was 15% with mildly elevated LVEDP. I recommended medical therapy. ?He had previous ventricular tachycardia and syncope that was successfully treated with ICD shock with subsequent adjustment of his device to deliver ATP.   ?He had ICD generator replacement in April 2019. ?Most recent echocardiogram in July 2021 showed an EF of 20 to 25% with moderate tricuspid regurgitation.  ?He was hospitalized in June 2021 with syncope and was found to have ventricular tachycardia treated with ATP.  CT scan showed subdural hematoma which necessitated holding Xarelto at that time.  At that time, he was transitioned to [facility] from ACE inhibitor.  Digoxin was added in September due to episodes of A. fib with RVR.  In addition, the dose of Toprol was increased to 25 mg twice daily. ? ?He had recurrent ventricular tachycardia in early 2022 thus underwent cardiac catheterization February 2022 which showed significant underlying one-vessel coronary artery disease affecting the mid to distal left circumflex into OM 3 with chronically occluded SVG to OM 3 and stable moderate mid LAD disease.  Overall, no significant  change from most recent cardiac catheterization in 2016.  LVEDP was normal.  He was treated with amiodarone. ? ?Metoprolol was discontinued due to bradycardia. ? ?He had dizziness with Jardiance and thus furosemide was discontinued during last visit.  However, he continued to have dizziness and Jardiance was discontinued. ? ?Unfortunately, his wife continues to deal with advanced breast cancer.  He has been under significant stress as a result.  He reports stable exertional dyspnea with no chest pain.  In spite of stopping furosemide, he does not have leg edema and his weight has been relatively stable.  He does have underlying chronic kidney disease with most recent creatinine of 1.7. ? ?Past Medical History:  ?Diagnosis Date  ? Actinic keratosis   ? Arthritis   ? Atrial fibrillation, persistent-long-term   ? Chronic systolic congestive heart failure ([place])   ? a. 03/2015 Echo: EF 30-35%, diff HK, mild MR, mod dil LA, mildly dil RA, mild TR.  ? Coronary artery disease   ? a. 1994 s/p CABG x 1 (VG->OM3);  b. 10/2015 Cath: LM nl, LAD min irregs, D1 nl, D2 min irregs, LCX 62m OM2 nl, OM3 90 (small territory), VG->OM3 100, RCA min irregs, RPDA/RPL1/RPL2/RPL3 nl, EF 25%-->Med Rx.  ? Dysrhythmia   ? Hx of squamous cell carcinoma of skin 06/04/2013  ? L upper arm  ? Hyperlipidemia   ? ICD (implantable cardiac defibrillator) in place   ? a. 02/2012 s/p SJM 1257-40Q Fortify Assurance single lead AICD.  ? Mixed Ischemic and Non-ischemic Cardiomyopathy   ?  a. 07/2012 s/p SJM AICD (RV lead only);  b. 03/2015 Echo: EF 30-35%, diff HK;  c. EF 25% by LV gram.  ? Nephrolithiasis 1970's  ? Polymyalgia rheumatica ([place])   ? On steroids  ? Sleep apnea   ? On CPAP  ? Squamous cell carcinoma of skin 06/04/2013  ? L upper arm - Bowen's disease   ? Type II diabetes mellitus ([facility])   ? Ventricular tachycardia -inducible at EP testing   ? a. 07/2012 s/ SJM AICD.  ? ? ?Past Surgical History:  ?Procedure Laterality Date  ? basel cell removal    ?  left arm  ? CARDIAC CATHETERIZATION  02/28/2012  ? Minor disease in LAD and RCA, LCX: 70% mid and occluded distally. Patent SVG to OM3  ? CARDIAC CATHETERIZATION N/A 11/08/2015  ? Procedure: Left Heart Cath and Cors/Grafts Angiography;  Surgeon: [name], MD;  Location: [place] [facility];  Service: Cardiovascular;  Laterality: N/A;  ? CARDIOVERSION  03/01/2012  ? Procedure: CARDIOVERSION;  Surgeon: Deboraha Sprang, MD;  Location: [place];  Service: Cardiovascular;  Laterality: N/A;  ? CATARACT EXTRACTION W/ INTRAOCULAR LENS  IMPLANT, BILATERAL  2011  ? CORONARY ARTERY BYPASS GRAFT  1994  ? [place] ; CABG X1  ? ELECTROPHYSIOLOGY STUDY N/A 02/28/2012  ? Procedure: ELECTROPHYSIOLOGY STUDY;  Surgeon: Deboraha Sprang, MD;  Location: [facility] CATH LAB;  Service: Cardiovascular;  Laterality: N/A;  ? ICD GENERATOR CHANGEOUT N/A 03/18/2018  ? Procedure: ICD GENERATOR CHANGEOUT;  Surgeon: Evans Lance, MD;  Location: [place] [facility];  Service: Cardiovascular;  Laterality: N/A;  ? INSERT / REPLACE / REMOVE PACEMAKER  02/28/12  ? "w/defibrillator"  ? JOINT REPLACEMENT    ? KNEE ARTHROSCOPY  2012  ? right  ? LEFT HEART CATH AND CORONARY ANGIOGRAPHY N/A 01/10/2021  ? Procedure: LEFT HEART CATH AND CORONARY ANGIOGRAPHY;  Surgeon: [name], MD;  Location: [place] [facility];  Service: Cardiovascular;  Laterality: N/A;  ? LEFT HEART CATHETERIZATION WITH CORONARY/GRAFT ANGIOGRAM N/A 02/28/2012  ? Procedure: LEFT HEART CATHETERIZATION WITH [name];  Surgeon: [name], MD;  Location: [facility] CATH LAB;  Service: Cardiovascular;  Laterality: N/A;  ? TOTAL KNEE ARTHROPLASTY  2011  ? left  ? ? ? ?Current Outpatient Medications  ?Medication Sig Dispense Refill  ? amiodarone (PACERONE) 200 MG tablet TAKE 1 TABLET BY MOUTH  DAILY 90 tablet 3  ? Cinnamon 500 MG TABS Take 1,000 mg by mouth 2 (two) times daily.     ? Coenzyme Q10 (CO Q 10 PO) Take 300 mg by mouth daily.    ? digoxin (LANOXIN) 0.125 MG tablet TAKE ONE-HALF  TABLET BY MOUTH  DAILY 45 tablet 0  ? diphenhydramine-acetaminophen (TYLENOL PM) 25-500 MG TABS tablet Take 2 tablets by mouth at bedtime as needed.    ? eplerenone (INSPRA) 25 MG tablet TAKE ONE-HALF TABLET BY MOUTH  DAILY 45 tablet 0  ? fluticasone (FLONASE) 50 MCG/ACT nasal spray Place 1 spray into the nose daily.    ? folic acid (FOLVITE) 1 MG tablet Take 1 mg by mouth daily.     ? Garlic 6789 MG CAPS Take 1 capsule by mouth daily.     ? Glucosamine-Chondroit-Vit C-Mn (GLUCOSAMINE CHONDR 1500 COMPLX PO) Take 1,500 mg by mouth 2 (two) times daily.     ? insulin NPH Human (HUMULIN N,NOVOLIN N) 100 UNIT/ML injection Inject 95 Units into the skin at bedtime.     ? insulin  regular (NOVOLIN R,HUMULIN R) 100 units/mL injection Inject 30-40 Units into the skin 3 (three) times daily before meals.    ? levocetirizine (XYZAL) 5 MG tablet Take 5 mg by mouth every evening.    ? levothyroxine (SYNTHROID) 75 MCG tablet Take by mouth daily.    ? meclizine (ANTIVERT) 25 MG tablet Take 25 mg by mouth 3 (three) times daily as needed for dizziness.    ? metFORMIN (GLUCOPHAGE) 500 MG tablet Take 500 mg tablet in the am with 0.5 tablet in the pm.    ? methotrexate (RHEUMATREX) 2.5 MG tablet Take 5 mg tablets on Wednesday.Caution:Chemotherapy. Protect from light.    ? metoprolol succinate (TOPROL-XL) 25 MG 24 hr tablet Take 25 mg by mouth daily.    ? mometasone (ELOCON) 0.1 % cream Apply 1 application topically daily as needed (Rash). 45 g 0  ? Omega-3 Fatty Acids (FISH OIL PO) Take by mouth 2 (two) times daily.     ? Rivaroxaban (XARELTO) 15 MG TABS tablet Take 1 tablet (15 mg total) by mouth daily with supper. 90 tablet 3  ? rosuvastatin (CRESTOR) 10 MG tablet Take 10 mg by mouth daily.    ? sacubitril-valsartan (ENTRESTO) 24-26 MG Take 1 tablet by mouth 2 (two) times daily. 180 tablet 3  ? ?No current facility-administered medications for this visit.  ? ? ?Allergies:   Patient has no known allergies.  ? ? ?Social History:  The  patient  reports that he quit smoking about 34 years ago. His smoking use included cigarettes. He has a 25.00 pack-year smoking history. He has never used smokeless tobacco. He reports that he does not drink a

## 2022-02-23 NOTE — Patient Instructions (Signed)

## 2022-02-27 ENCOUNTER — Other Ambulatory Visit: Payer: Self-pay | Admitting: Interventional Cardiology

## 2022-02-28 ENCOUNTER — Encounter: Payer: Self-pay | Admitting: Internal Medicine

## 2022-02-28 ENCOUNTER — Ambulatory Visit: Payer: Medicare Other | Admitting: Internal Medicine

## 2022-02-28 VITALS — BP 120/70 | HR 57 | Ht 70.0 in | Wt 215.0 lb

## 2022-02-28 DIAGNOSIS — Z79899 Other long term (current) drug therapy: Secondary | ICD-10-CM

## 2022-02-28 DIAGNOSIS — I255 Ischemic cardiomyopathy: Secondary | ICD-10-CM | POA: Diagnosis not present

## 2022-02-28 DIAGNOSIS — I4821 Permanent atrial fibrillation: Secondary | ICD-10-CM

## 2022-02-28 DIAGNOSIS — I5022 Chronic systolic (congestive) heart failure: Secondary | ICD-10-CM

## 2022-02-28 DIAGNOSIS — I472 Ventricular tachycardia, unspecified: Secondary | ICD-10-CM | POA: Diagnosis not present

## 2022-02-28 DIAGNOSIS — Z95810 Presence of automatic (implantable) cardiac defibrillator: Secondary | ICD-10-CM

## 2022-02-28 LAB — PACEMAKER DEVICE OBSERVATION

## 2022-02-28 MED ORDER — EPLERENONE 25 MG PO TABS: ORAL_TABLET | ORAL | 2 refills | Status: DC

## 2022-02-28 NOTE — Progress Notes (Signed)
Patient ID: Andres Shutter., male   DOB: 14-Feb-1941, 81 y.o.   MRN: [id number] ?  ? ? ? ? ? ?Patient Care Team: ?[name], MD as PCP - General (Family Medicine) ?Deboraha Sprang, MD as PCP - Electrophysiology (Cardiology) ?[name], MD as PCP - Cardiology (Cardiology) ? ? ?HPI ? ?Andres Judith Part Sr. is a 81 y.o. male ?Seen in followup for ICD [employer] implanted for ischemic/nonischemic cardiomyopathy  and inducible ventricular tachycardia -April 2013 catheterization April 2013 demonstrating patent single vein graft to the OM. bypass had been  done emergently in [place] generator replacement 4/19 (GT) ?Permanent atrial fibrillation on Xarelto without bleeding  ? ?Interval syncope assoc with rapid pace terminated VT  ?  ? ?Appropriate Therapy yes  VT monomorphic 5/18 Cl 220--240 and 7/21 assoc with syncope and complicated by subdural hematoma ?Recurrent VT 12/21 monomorphic and treated with ATP successfully ?Now on amiodarone ?Patient denies symptoms of respiratory, GI intolerance, sun sensitivity, neurological symptoms attributable to amiodarone although there has been more dizziness. ? ? ?He was switched to [facility] for the adjunctive benefit of decreased ventricular tachycardia ?The patient denies chest pain, nocturnal dyspnea, orthopnea.  There have been no palpitations or syncope.  Complains of shortness of breath with exertion.  Even modest exertion.**Orthopnea.  Lightheadedness with standing from a chair or with un   bending ? ?Life is very difficult with caring for his wife with her cancer and hisdaughter-- ?  ? ?DATE TEST EF%   ?2011  Echo [place]  14 %   ?12/16 [facility]  15 % SVG-OM T; LAD/RCA non obstru  ?8/20 Echo  30-35% LAE mod  ?7/21 Echo  20-25%   ?02/22 LHC   SVG-OM3 T; CXm-90% ?LAD/RCA nonobstructive  ?3/22 Echo  25-30%   ? ? ?Date Cr K TSH LFT Dig Hgb  ?4/18  1.4 5.2       ?4/19 0.36 4.2    14.3  ?5/20 1.3 4.8 (2/20)    15.6  ?7/21 1.36 4.5    16.0  ?12/21 1.47 4.1 5.49 (CE)  19  0.6(10/21) 14.9  ?5/22(CE) 1.9 4.7 9.68(4/22) 15 0.5 (7/22) 15.1  ?3/23 (CE)  1.7 4.7 4.67 17 (1/23)  14.6 (1/23)  ?  ?    ?Thromboembolic risk factors ( age  -2, HTN-1, DM-1, Vasc disease -1, CHF-1) for a CHADSVASc Score of >=6 ?  ?Past Medical History:  ?Diagnosis Date  ? Actinic keratosis   ? Arthritis   ? Atrial fibrillation, persistent-long-term   ? Chronic systolic congestive heart failure ([place])   ? a. 03/2015 Echo: EF 30-35%, diff HK, mild MR, mod dil LA, mildly dil RA, mild TR.  ? Coronary artery disease   ? a. 1994 s/p CABG x 1 (VG->OM3);  b. 10/2015 Cath: LM nl, LAD min irregs, D1 nl, D2 min irregs, LCX 35m OM2 nl, OM3 90 (small territory), VG->OM3 100, RCA min irregs, RPDA/RPL1/RPL2/RPL3 nl, EF 25%-->Med Rx.  ? Dysrhythmia   ? Hx of squamous cell carcinoma of skin 06/04/2013  ? L upper arm  ? Hyperlipidemia   ? ICD (implantable cardiac defibrillator) in place   ? a. 02/2012 s/p SJM 1257-40Q Fortify Assurance single lead AICD.  ? Mixed Ischemic and Non-ischemic Cardiomyopathy   ? a. 07/2012 s/p SJM AICD (RV lead only);  b. 03/2015 Echo: EF 30-35%, diff HK;  c. EF 25% by LV gram.  ? Nephrolithiasis 1970's  ? [place]   ?  On steroids  ? Sleep apnea   ? On CPAP  ? Squamous cell carcinoma of skin 06/04/2013  ? L upper arm - Bowen's disease   ? Type II diabetes mellitus ([place])   ? Ventricular tachycardia -inducible at EP testing   ? a. 07/2012 s/ SJM AICD.  ? ? ?Past Surgical History:  ?Procedure Laterality Date  ? basel cell removal    ? left arm  ? CARDIAC CATHETERIZATION  02/28/2012  ? Minor disease in LAD and RCA, LCX: 70% mid and occluded distally. Patent SVG to OM3  ? CARDIAC CATHETERIZATION N/A 11/08/2015  ? Procedure: Left Heart Cath and Cors/Grafts Angiography;  Surgeon: [name], MD;  Location: [place] [facility];  Service: Cardiovascular;  Laterality: N/A;  ? CARDIOVERSION  03/01/2012  ? Procedure: CARDIOVERSION;  Surgeon: Deboraha Sprang, MD;  Location: [place];  Service:  Cardiovascular;  Laterality: N/A;  ? CATARACT EXTRACTION W/ INTRAOCULAR LENS  IMPLANT, BILATERAL  2011  ? CORONARY ARTERY BYPASS GRAFT  1994  ? [place] ; CABG X1  ? ELECTROPHYSIOLOGY STUDY N/A 02/28/2012  ? Procedure: ELECTROPHYSIOLOGY STUDY;  Surgeon: Deboraha Sprang, MD;  Location: [facility] CATH LAB;  Service: Cardiovascular;  Laterality: N/A;  ? ICD GENERATOR CHANGEOUT N/A 03/18/2018  ? Procedure: ICD GENERATOR CHANGEOUT;  Surgeon: Evans Lance, MD;  Location: [place] [facility];  Service: Cardiovascular;  Laterality: N/A;  ? INSERT / REPLACE / REMOVE PACEMAKER  02/28/12  ? "w/defibrillator"  ? JOINT REPLACEMENT    ? KNEE ARTHROSCOPY  2012  ? right  ? LEFT HEART CATH AND CORONARY ANGIOGRAPHY N/A 01/10/2021  ? Procedure: LEFT HEART CATH AND CORONARY ANGIOGRAPHY;  Surgeon: [name], MD;  Location: [place] [facility];  Service: Cardiovascular;  Laterality: N/A;  ? LEFT HEART CATHETERIZATION WITH CORONARY/GRAFT ANGIOGRAM N/A 02/28/2012  ? Procedure: LEFT HEART CATHETERIZATION WITH [name];  Surgeon: [name], MD;  Location: [facility] CATH LAB;  Service: Cardiovascular;  Laterality: N/A;  ? TOTAL KNEE ARTHROPLASTY  2011  ? left  ? ? ?Current Outpatient Medications  ?Medication Sig Dispense Refill  ? amiodarone (PACERONE) 200 MG tablet TAKE 1 TABLET BY MOUTH  DAILY 90 tablet 3  ? Cinnamon 500 MG TABS Take 1,000 mg by mouth 2 (two) times daily.     ? Coenzyme Q10 (CO Q 10 PO) Take 300 mg by mouth daily.    ? digoxin (LANOXIN) 0.125 MG tablet TAKE ONE-HALF TABLET BY MOUTH  DAILY 45 tablet 0  ? diphenhydramine-acetaminophen (TYLENOL PM) 25-500 MG TABS tablet Take 2 tablets by mouth at bedtime as needed.    ? eplerenone (INSPRA) 25 MG tablet TAKE ONE-HALF TABLET BY MOUTH  DAILY 45 tablet 2  ? fluticasone (FLONASE) 50 MCG/ACT nasal spray Place 1 spray into the nose daily.    ? folic acid (FOLVITE) 1 MG tablet Take 1 mg by mouth daily.     ? Garlic 2694 MG CAPS Take 1 capsule by mouth daily.     ?  Glucosamine-Chondroit-Vit C-Mn (GLUCOSAMINE CHONDR 1500 COMPLX PO) Take 1,500 mg by mouth 2 (two) times daily.     ? insulin NPH Human (HUMULIN N,NOVOLIN N) 100 UNIT/ML injection Inject 95 Units into the skin at bedtime.     ? insulin regular (NOVOLIN R,HUMULIN R) 100 units/mL injection Inject 30-40 Units into the skin 3 (three) times daily before meals.    ? levocetirizine (XYZAL) 5 MG tablet Take 5 mg by mouth every evening.    ?  levothyroxine (SYNTHROID) 75 MCG tablet Take by mouth daily.    ? meclizine (ANTIVERT) 25 MG tablet Take 25 mg by mouth 3 (three) times daily as needed for dizziness.    ? metFORMIN (GLUCOPHAGE) 500 MG tablet Take 500 mg tablet in the am with 0.5 tablet in the pm.    ? methotrexate (RHEUMATREX) 2.5 MG tablet Take 5 mg tablets on Wednesday.Caution:Chemotherapy. Protect from light.    ? metoprolol succinate (TOPROL-XL) 25 MG 24 hr tablet Take 25 mg by mouth daily.    ? mometasone (ELOCON) 0.1 % cream Apply 1 application topically daily as needed (Rash). 45 g 0  ? Omega-3 Fatty Acids (FISH OIL PO) Take by mouth 2 (two) times daily.     ? Rivaroxaban (XARELTO) 15 MG TABS tablet Take 1 tablet (15 mg total) by mouth daily with supper. 90 tablet 3  ? rosuvastatin (CRESTOR) 10 MG tablet Take 10 mg by mouth daily.    ? sacubitril-valsartan (ENTRESTO) 24-26 MG Take 1 tablet by mouth 2 (two) times daily. 180 tablet 3  ? ?No current facility-administered medications for this visit.  ? ? ?No Known Allergies ? ?Physical Exam: ?BP 120/70 (BP Location: Left Arm, Patient Position: Sitting, Cuff Size: Normal)   Pulse (!) 57   Ht '5\' 10"'$  (1.778 m)   Wt 215 lb (97.5 kg)   SpO2 96%   BMI 30.85 kg/m?  ?Well developed and well nourished in no acute distress ?HENT normal ?Neck supple with JVP 8 ?Clear ?Device pocket well healed; without hematoma or erythema.  There is no tethering  ?Irregularly Irregular rate and rhythm with controlled  ventricular response , no  gallop No  murmur ?Abd-soft with active  BS ?No Clubbing cyanosis 1+ edema ?Skin-warm and dry ?A & Oriented  Grossly normal sensory and motor function ? ?ECG atrial fibrillation at 57 ?Intervals-/12/46 ?Axis rightward at 103 ?Nonspecific ST-T changes ? ? ?Assessment and  P

## 2022-02-28 NOTE — Patient Instructions (Signed)
Medication Instructions:  ?- Your physician has recommended you make the following change in your medication:  ? ?1) CHANGE Inspra (Eplerenone) to taking this at bedtime (same dose) ? ?2) CHANGE Toprol LX (metoprolol succinate) to taking this at bedtime (same dose) ? ?*If you need a refill on your cardiac medications before your next appointment, please call your pharmacy* ? ? ?Lab Work: ?- Your physician recommends that you have lab work today: Digoxin level ? ?If you have labs (blood work) drawn today and your tests are completely normal, you will receive your results only by: ?MyChart Message (if you have MyChart) OR ?A paper copy in the mail ?If you have any lab test that is abnormal or we need to change your treatment, we will call you to review the results. ? ? ?Testing/Procedures: ?- none ordered ? ? ?Follow-Up: ?At [facility], you and your health needs are our priority.  As part of our continuing mission to provide you with exceptional heart care, we have created designated Provider Care Teams.  These Care Teams include your primary Cardiologist (physician) and Advanced Practice Providers (APPs -  Physician Assistants and Nurse Practitioners) who all work together to provide you with the care you need, when you need it. ? ?We recommend signing up for the patient portal called "MyChart".  Sign up information is provided on this After Visit Summary.  MyChart is used to connect with patients for Virtual Visits (Telemedicine).  Patients are able to view lab/test results, encounter notes, upcoming appointments, etc.  Non-urgent messages can be sent to your provider as well.   ?To learn more about what you can do with MyChart, go to [email or web address].   ? ?Your next appointment:   ?6 month(s) ? ?The format for your next appointment:   ?In Person ? ?Provider:   ?Virl Axe, MD  ? ? ?Other Instructions ?1) Call the office in about 2 weeks to let us know how your dizziness/ lightheadedness are doing ? ?

## 2022-03-01 LAB — DIGOXIN LEVEL: Digoxin, Serum: 0.4 ng/mL — ABNORMAL LOW (ref 0.5–0.9)

## 2022-03-01 NOTE — Progress Notes (Signed)
Remote ICD transmission.   

## 2022-03-15 LAB — CUP PACEART INCLINIC DEVICE CHECK
Date Time Interrogation Session: 20230419161540
Implantable Lead Implant Date: 20130403
Implantable Lead Location: 753860
Implantable Pulse Generator Implant Date: 20190422
Pulse Gen Serial Number: 9811176

## 2022-03-24 ENCOUNTER — Other Ambulatory Visit: Payer: Self-pay

## 2022-03-24 MED ORDER — ENTRESTO 24-26 MG PO TABS: 1.0000 | ORAL_TABLET | Freq: Two times a day (BID) | ORAL | 1 refills | Status: DC

## 2022-03-30 ENCOUNTER — Other Ambulatory Visit: Payer: Self-pay | Admitting: Internal Medicine

## 2022-04-05 ENCOUNTER — Encounter: Payer: Self-pay | Admitting: Dermatology

## 2022-04-12 ENCOUNTER — Encounter: Payer: Self-pay | Admitting: Internal Medicine

## 2022-04-29 ENCOUNTER — Other Ambulatory Visit: Payer: Self-pay | Admitting: Internal Medicine

## 2022-05-02 ENCOUNTER — Other Ambulatory Visit: Payer: Self-pay | Admitting: Cardiovascular Disease

## 2022-05-02 DIAGNOSIS — I482 Chronic atrial fibrillation, unspecified: Secondary | ICD-10-CM

## 2022-05-02 DIAGNOSIS — I472 Ventricular tachycardia, unspecified: Secondary | ICD-10-CM

## 2022-05-04 ENCOUNTER — Other Ambulatory Visit: Payer: Self-pay | Admitting: Cardiovascular Disease

## 2022-05-04 DIAGNOSIS — I482 Chronic atrial fibrillation, unspecified: Secondary | ICD-10-CM

## 2022-05-04 DIAGNOSIS — I472 Ventricular tachycardia, unspecified: Secondary | ICD-10-CM

## 2022-05-22 ENCOUNTER — Ambulatory Visit (INDEPENDENT_AMBULATORY_CARE_PROVIDER_SITE_OTHER): Payer: Medicare Other

## 2022-05-22 DIAGNOSIS — I255 Ischemic cardiomyopathy: Secondary | ICD-10-CM

## 2022-05-22 DIAGNOSIS — I5022 Chronic systolic (congestive) heart failure: Secondary | ICD-10-CM | POA: Diagnosis not present

## 2022-05-24 LAB — CUP PACEART REMOTE DEVICE CHECK
Battery Remaining Longevity: 65 mo
Battery Remaining Percentage: 63 %
Battery Voltage: 2.96 V
Brady Statistic RV Percent Paced: 17 %
Date Time Interrogation Session: 20230626020023
HighPow Impedance: 72 Ohm
HighPow Impedance: 72 Ohm
Implantable Lead Implant Date: 20130403
Implantable Lead Location: 753860
Implantable Pulse Generator Implant Date: 20190422
Lead Channel Impedance Value: 360 Ohm
Lead Channel Pacing Threshold Amplitude: 1 V
Lead Channel Pacing Threshold Pulse Width: 0.5 ms
Lead Channel Sensing Intrinsic Amplitude: 11.7 mV
Lead Channel Setting Pacing Amplitude: 2.5 V
Lead Channel Setting Pacing Pulse Width: 0.5 ms
Lead Channel Setting Sensing Sensitivity: 0.5 mV
Pulse Gen Serial Number: 9811176

## 2022-06-12 ENCOUNTER — Ambulatory Visit: Payer: Medicare Other | Admitting: Dermatology

## 2022-06-15 NOTE — Progress Notes (Signed)
Remote ICD transmission.   

## 2022-06-27 ENCOUNTER — Encounter: Payer: Self-pay | Admitting: Emergency Medicine

## 2022-06-27 ENCOUNTER — Other Ambulatory Visit: Payer: Self-pay

## 2022-06-27 ENCOUNTER — Emergency Department
Admission: EM | Admit: 2022-06-27 | Discharge: 2022-06-27 | Disposition: A | Discharge status: Home / Self Care | Payer: Medicare Other | Attending: Emergency Medicine | Admitting: Emergency Medicine

## 2022-06-27 ENCOUNTER — Emergency Department: Payer: Medicare Other

## 2022-06-27 DIAGNOSIS — S9001XA Contusion of right ankle, initial encounter: Secondary | ICD-10-CM | POA: Diagnosis not present

## 2022-06-27 DIAGNOSIS — S99911A Unspecified injury of right ankle, initial encounter: Secondary | ICD-10-CM | POA: Diagnosis present

## 2022-06-27 DIAGNOSIS — W010XXA Fall on same level from slipping, tripping and stumbling without subsequent striking against object, initial encounter: Secondary | ICD-10-CM | POA: Diagnosis not present

## 2022-06-27 DIAGNOSIS — Z23 Encounter for immunization: Secondary | ICD-10-CM | POA: Insufficient documentation

## 2022-06-27 DIAGNOSIS — Y92007 Garden or yard of unspecified non-institutional (private) residence as the place of occurrence of the external cause: Secondary | ICD-10-CM | POA: Insufficient documentation

## 2022-06-27 DIAGNOSIS — S90511A Abrasion, right ankle, initial encounter: Secondary | ICD-10-CM

## 2022-06-27 MED ORDER — TETANUS-DIPHTH-ACELL PERTUSSIS 5-2.5-18.5 LF-MCG/0.5 IM SUSY
0.5000 mL | PREFILLED_SYRINGE | Freq: Once | INTRAMUSCULAR | Status: AC
  Administered 2022-06-27: 0.5 mL via INTRAMUSCULAR
  Filled 2022-06-27: qty 0.5

## 2022-06-27 MED ORDER — HYDROCODONE-ACETAMINOPHEN 5-325 MG PO TABS
1.0000 | ORAL_TABLET | Freq: Once | ORAL | Status: AC
  Administered 2022-06-27: 1 via ORAL
  Filled 2022-06-27: qty 1

## 2022-06-27 NOTE — ED Provider Notes (Signed)
[facility] Provider Note    Event Date/Time   First MD Initiated Contact with Patient 06/27/22 [phone]     (approximate)   History   Ankle Pain   HPI  Andres [name] Sr. is a 81 y.o. male   Libby Maw to the ED with complaint of abrasions to the right ankle after mechanical fall that occurred yesterday.  Patient states that he hit a cinderblock in his garden causing him to fall.  He is unaware of the last tetanus but daughter states it has been a long time.      Physical Exam   Triage Vital Signs: ED Triage Vitals  Enc Vitals Group     BP 06/27/22 0605 (!) 127/54     Pulse Rate 06/27/22 0605 (!) 58     Resp 06/27/22 0605 18     Temp 06/27/22 0605 97.9 F (36.6 C)     Temp Source 06/27/22 0605 Oral     SpO2 06/27/22 0605 96 %     Weight 06/27/22 0607 209 lb (94.8 kg)     Height 06/27/22 0607 '5\' 10"'$  (1.778 m)     Head Circumference --      Peak Flow --      Pain Score 06/27/22 0612 7     Pain Loc --      Pain Edu? --      Excl. in [place]? --     Most recent vital signs: Vitals:   06/27/22 0605  BP: (!) 127/54  Pulse: (!) 58  Resp: 18  Temp: 97.9 F (36.6 C)  SpO2: 96%     General: Awake, no distress.  CV:  Good peripheral perfusion.  Resp:  Normal effort.  Abd:  No distention.  Other:  Medial aspect of the right ankle has a 4 and half centimeter superficial abrasion that is linear.  No foreign body and no active bleeding.  Minimal soft tissue edema present.  DP pulse present and motor or sensory function intact.   ED Results / Procedures / Treatments   Labs (all labs ordered are listed, but only abnormal results are displayed) Labs Reviewed - No data to display    RADIOLOGY X-rays of the right ankle reviewed and interpreted by myself independent of the radiologist is negative for foreign body or fracture.    PROCEDURES:  Critical Care performed:   Procedures   MEDICATIONS ORDERED IN ED: Medications  Tdap (BOOSTRIX)  injection 0.5 mL (0.5 mLs Intramuscular Given 06/27/22 0736)  HYDROcodone-acetaminophen (NORCO/VICODIN) 5-325 MG per tablet 1 tablet (1 tablet Oral Given 06/27/22 0735)     IMPRESSION / MDM / ASSESSMENT AND PLAN / ED COURSE  I reviewed the triage vital signs and the nursing notes.   Differential diagnosis includes, but is not limited to, fractured ankle, sprained ankle, foreign body, laceration, abrasions.  81 year old male presents to the ED with a injury to his right ankle that occurred yesterday afternoon when he tripped over a cinderblock.  Patient states that he was in his garden and bumped into the cinderblock losing his balance and falling.  Patient denies any loss of consciousness.  Tdap was given and area was cleaned with normal saline and dressed.  Patient was reassured that there was no fracture noted to his ankle and also no foreign bodies were seen on the x-ray.  He was given instructions on how to clean the area and watch for any signs of infection.  He is to follow-up with his  PCP if any continued problems or return to the emergency department.  Ice and elevation today and use walker for added support and protection.  Patient has a prescription for hydrocodone at home from wrist surgery and verified through notes at [facility] with Dr. Rudene Christians.  Patient was dispensed hydrocodone No. 15 and patient states he has not taken any.  He is encouraged to take 1 every 6 hours if needed for moderate pain and reassured that the amount that was dispensed is not enough for him to get addicted to.      Patient's presentation is most consistent with acute complicated illness / injury requiring diagnostic workup.  FINAL CLINICAL IMPRESSION(S) / ED DIAGNOSES   Final diagnoses:  Abrasion of right ankle, initial encounter  Contusion of right ankle, initial encounter     Rx / DC Orders   ED Discharge Orders     None        Note:  This document was prepared using Dragon voice recognition  software and may include unintentional dictation errors.   Johnn Hai, PA-C 06/27/22 [id number]    Harvest Dark, MD 06/27/22 Lurena Nida

## 2022-06-27 NOTE — ED Notes (Signed)
See triage note  Present s/p fall  States he was in his garden trying to move cinder block  Wound to right ankle area

## 2022-06-27 NOTE — ED Triage Notes (Signed)
Pt presents via POV with complaints of right ankle pain following a mechanical fall yesterday. Pt has abrasion to his right inner ankle dressed with gauze and curlex. Denies hitting his head or LOC.

## 2022-06-27 NOTE — Discharge Instructions (Addendum)
Clean daily with mild soap and water and allowed to dry completely before covering it.  Watch for any signs of infection such as redness, fever or pus.  Elevate ankle when sitting.  Take hydrocodone that you have at home every 6 hours as needed for pain.  This contains Tylenol with this that do not more than 1 Tylenol with it if additional pain medication is needed.

## 2022-06-27 NOTE — ED Triage Notes (Signed)
First RN Note: pt to ED via POV from home, pt states was in his garden yesterday and fell. Pt c/o pain from his R ankle up into his calf. Pt states mechanical fall at this time. Pt A&O x4, ambulatory with steady gait to triage. Pt with bandage in place to R medial ankle at this time.

## 2022-06-28 ENCOUNTER — Encounter: Payer: Self-pay | Admitting: Dermatology

## 2022-06-28 ENCOUNTER — Ambulatory Visit: Payer: Medicare Other | Admitting: Dermatology

## 2022-06-28 DIAGNOSIS — L570 Actinic keratosis: Secondary | ICD-10-CM

## 2022-06-28 DIAGNOSIS — D1800 Hemangioma unspecified site: Secondary | ICD-10-CM

## 2022-06-28 DIAGNOSIS — Z85828 Personal history of other malignant neoplasm of skin: Secondary | ICD-10-CM | POA: Diagnosis not present

## 2022-06-28 DIAGNOSIS — Z1283 Encounter for screening for malignant neoplasm of skin: Secondary | ICD-10-CM | POA: Diagnosis not present

## 2022-06-28 DIAGNOSIS — L821 Other seborrheic keratosis: Secondary | ICD-10-CM

## 2022-06-28 DIAGNOSIS — L814 Other melanin hyperpigmentation: Secondary | ICD-10-CM

## 2022-06-28 DIAGNOSIS — L578 Other skin changes due to chronic exposure to nonionizing radiation: Secondary | ICD-10-CM

## 2022-06-28 DIAGNOSIS — L817 Pigmented purpuric dermatosis: Secondary | ICD-10-CM

## 2022-06-28 DIAGNOSIS — D229 Melanocytic nevi, unspecified: Secondary | ICD-10-CM

## 2022-06-28 NOTE — Progress Notes (Signed)
Follow-Up Visit   Subjective  Andres FLATER Sr. is a 81 y.o. male who presents for the following: UBSE (Hx SCC, AK's ). The patient presents for Upper Body Skin Exam (UBSE) for skin cancer screening and mole check.  The patient has spots, moles and lesions to be evaluated, some may be new or changing and the patient has concerns that these could be cancer.  The following portions of the chart were reviewed this encounter and updated as appropriate:   Tobacco  Allergies  Meds  Problems  Med Hx  Surg Hx  Fam Hx     Review of Systems:  No other skin or systemic complaints except as noted in HPI or Assessment and Plan.  Objective  Well appearing patient in no apparent distress; mood and affect are within normal limits.  All skin waist up examined. Legs also examined.  Face, ears, and scalp x 8 (8) Erythematous thin papules/macules with gritty scale.    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  Recommend daily graduated compression hose/stockings- easiest to put on first thing in morning, remove at bedtime.  Elevate legs as much as possible. Avoid salt/sodium rich foods.  Reassured patient this is a benign condition. Recommend knee high compression stockings.   AK (actinic keratosis) (8) Face, ears, and scalp x 8 Destruction of lesion - Face, ears, and scalp x 8 Complexity: simple   Destruction method: cryotherapy   Informed consent: discussed and consent obtained   Timeout:  patient name, date of birth, surgical site, and procedure verified Lesion destroyed using liquid nitrogen: Yes   Region frozen until ice ball extended beyond lesion: Yes   Outcome: patient tolerated procedure well with no complications   Post-procedure details: wound care instructions given    Lentigines -  Scattered tan macules - Due to sun exposure - Benign-appearing, observe - Recommend daily broad spectrum sunscreen SPF 30+ to sun-exposed areas, reapply every 2 hours as needed. - Call for any changes  Seborrheic Keratoses - Stuck-on, waxy, tan-brown papules and/or plaques  - Benign-appearing - Discussed benign etiology and prognosis. - Observe - Call for any changes  Melanocytic Nevi - Tan-brown and/or pink-flesh-colored symmetric macules and papules - Benign appearing on exam today - Observation - Call clinic for new or changing moles - Recommend daily use of broad spectrum spf 30+ sunscreen to sun-exposed areas.   Hemangiomas - Red papules - Discussed benign nature - Observe - Call for any changes  Actinic Damage - Chronic condition, secondary to cumulative UV/sun exposure - diffuse scaly erythematous macules with underlying dyspigmentation - Recommend daily broad spectrum sunscreen SPF 30+ to sun-exposed areas, reapply every 2 hours as needed.  - Staying in the shade or wearing long sleeves, sun glasses (UVA+UVB protection) and wide brim hats (4-inch brim around the entire circumference of the hat) are also recommended for sun protection.  - Call for new or changing lesions.  History of Squamous Cell Carcinoma of the Skin - No evidence of recurrence today - No lymphadenopathy - Recommend regular full body skin exams - Recommend daily broad spectrum sunscreen SPF 30+ to sun-exposed areas, reapply every 2 hours as needed.  - Call if any new or changing lesions are noted between office visits  Skin cancer screening performed today.  Return in about 6 months (around 12/29/2022) for  AK follow up and TBSE.  Luther Redo, CMA, am acting as scribe for Sarina Ser, MD . Documentation: I have reviewed the above documentation for accuracy and completeness, and I agree with the above.  Sarina Ser, MD

## 2022-06-28 NOTE — Patient Instructions (Signed)
Due to recent changes in healthcare laws, you may see results of your pathology and/or laboratory studies on MyChart before the doctors have had a chance to review them. We understand that in some cases there may be results that are confusing or concerning to you. Please understand that not all results are received at the same time and often the doctors may need to interpret multiple results in order to provide you with the best plan of care or course of treatment. Therefore, we ask that you please give us 2 business days to thoroughly review all your results before contacting the office for clarification. Should we see a critical lab result, you will be contacted sooner.   If You Need Anything After Your Visit  If you have any questions or concerns for your doctor, please call our main line at [phone] and press option 4 to reach your doctor's medical assistant. If no one answers, please leave a voicemail as directed and we will return your call as soon as possible. Messages left after 4 pm will be answered the following business day.   You may also send us a message via MyChart. We typically respond to MyChart messages within 1-2 business days.  For prescription refills, please ask your pharmacy to contact our office. Our fax number is [phone].  If you have an urgent issue when the clinic is closed that cannot wait until the next business day, you can page your doctor at the number below.    Please note that while we do our best to be available for urgent issues outside of office hours, we are not available 24/7.   If you have an urgent issue and are unable to reach us, you may choose to seek medical care at your doctor's office, retail clinic, urgent care center, or emergency room.  If you have a medical emergency, please immediately call 911 or go to the emergency department.  Pager Numbers  - Dr. Kowalski: [phone]  - Dr. Moye: [phone]  - Dr. Stewart:  [phone]  In the event of inclement weather, please call our main line at [phone] for an update on the status of any delays or closures.  Dermatology Medication Tips: Please keep the boxes that topical medications come in in order to help keep track of the instructions about where and how to use these. Pharmacies typically print the medication instructions only on the boxes and not directly on the medication tubes.   If your medication is too expensive, please contact our office at [phone] option 4 or send us a message through MyChart.   We are unable to tell what your co-pay for medications will be in advance as this is different depending on your insurance coverage. However, we may be able to find a substitute medication at lower cost or fill out paperwork to get insurance to cover a needed medication.   If a prior authorization is required to get your medication covered by your insurance company, please allow us 1-2 business days to complete this process.  Drug prices often vary depending on where the prescription is filled and some pharmacies may offer cheaper prices.  The website www.goodrx.com contains coupons for medications through different pharmacies. The prices here do not account for what the cost may be with help from insurance (it may be cheaper with your insurance), but the website can give you the price if you did not use any insurance.  - You can print the associated coupon and take it with   your prescription to the pharmacy.  - You may also stop by our office during regular business hours and pick up a GoodRx coupon card.  - If you need your prescription sent electronically to a different pharmacy, notify our office through [facility] MyChart or by phone at [phone] option 4.     Si Usted Necesita Algo Despus de Su Visita  Tambin puede enviarnos un mensaje a travs de MyChart. Por lo general respondemos a los mensajes de MyChart en el transcurso de 1 a 2  das hbiles.  Para renovar recetas, por favor pida a su farmacia que se ponga en contacto con nuestra oficina. Nuestro nmero de fax es el [phone].  Si tiene un asunto urgente cuando la clnica est cerrada y que no puede esperar hasta el siguiente da hbil, puede llamar/localizar a su doctor(a) al nmero que aparece a continuacin.   Por favor, tenga en cuenta que aunque hacemos todo lo posible para estar disponibles para asuntos urgentes fuera del horario de oficina, no estamos disponibles las 24 horas del da, los 7 das de la semana.   Si tiene un problema urgente y no puede comunicarse con nosotros, puede optar por buscar atencin mdica  en el consultorio de su doctor(a), en una clnica privada, en un centro de atencin urgente o en una sala de emergencias.  Si tiene una emergencia mdica, por favor llame inmediatamente al 911 o vaya a la sala de emergencias.  Nmeros de bper  - Dr. Kowalski: [phone]  - Dra. Moye: [phone]  - Dra. Stewart: [phone]  En caso de inclemencias del tiempo, por favor llame a nuestra lnea principal al [phone] para una actualizacin sobre el estado de cualquier retraso o cierre.  Consejos para la medicacin en dermatologa: Por favor, guarde las cajas en las que vienen los medicamentos de uso tpico para ayudarle a seguir las instrucciones sobre dnde y cmo usarlos. Las farmacias generalmente imprimen las instrucciones del medicamento slo en las cajas y no directamente en los tubos del medicamento.   Si su medicamento es muy caro, por favor, pngase en contacto con nuestra oficina llamando al [phone] y presione la opcin 4 o envenos un mensaje a travs de MyChart.   No podemos decirle cul ser su copago por los medicamentos por adelantado ya que esto es diferente dependiendo de la cobertura de su seguro. Sin embargo, es posible que podamos encontrar un medicamento sustituto a menor costo o llenar un formulario para que el  seguro cubra el medicamento que se considera necesario.   Si se requiere una autorizacin previa para que su compaa de seguros cubra su medicamento, por favor permtanos de 1 a 2 das hbiles para completar este proceso.  Los precios de los medicamentos varan con frecuencia dependiendo del lugar de dnde se surte la receta y alguna farmacias pueden ofrecer precios ms baratos.  El sitio web www.goodrx.com tiene cupones para medicamentos de diferentes farmacias. Los precios aqu no tienen en cuenta lo que podra costar con la ayuda del seguro (puede ser ms barato con su seguro), pero el sitio web puede darle el precio si no utiliz ningn seguro.  - Puede imprimir el cupn correspondiente y llevarlo con su receta a la farmacia.  - Tambin puede pasar por nuestra oficina durante el horario de atencin regular y recoger una tarjeta de cupones de GoodRx.  - Si necesita que su receta se enve electrnicamente a una farmacia diferente, informe a nuestra oficina a travs de MyChart de [facility]   o por telfono llamando al [phone] y presione la opcin 4.  

## 2022-07-18 ENCOUNTER — Other Ambulatory Visit: Payer: Self-pay | Admitting: Cardiovascular Disease

## 2022-07-18 DIAGNOSIS — I482 Chronic atrial fibrillation, unspecified: Secondary | ICD-10-CM

## 2022-07-18 DIAGNOSIS — I472 Ventricular tachycardia, unspecified: Secondary | ICD-10-CM

## 2022-08-04 ENCOUNTER — Other Ambulatory Visit: Payer: Self-pay | Admitting: Internal Medicine

## 2022-08-04 NOTE — Telephone Encounter (Signed)
Refill Request.  

## 2022-08-04 NOTE — Telephone Encounter (Signed)
Prescription refill request for Xarelto received.  Indication:Afib Last office visit:4/23 Weight:94.8 kg Age:81 Scr:1.7 CrCl:45.7 ml/min  Prescription refilled

## 2022-08-15 ENCOUNTER — Other Ambulatory Visit: Payer: Self-pay | Admitting: Cardiovascular Disease

## 2022-08-20 ENCOUNTER — Other Ambulatory Visit: Payer: Self-pay

## 2022-08-20 ENCOUNTER — Emergency Department: Payer: Medicare Other

## 2022-08-20 ENCOUNTER — Emergency Department
Admission: EM | Admit: 2022-08-20 | Discharge: 2022-08-20 | Disposition: A | Discharge status: Home / Self Care | Payer: Medicare Other | Attending: Emergency Medicine | Admitting: Emergency Medicine

## 2022-08-20 ENCOUNTER — Encounter: Payer: Self-pay | Admitting: Intensive Care

## 2022-08-20 DIAGNOSIS — I509 Heart failure, unspecified: Secondary | ICD-10-CM | POA: Diagnosis not present

## 2022-08-20 DIAGNOSIS — I2510 Atherosclerotic heart disease of native coronary artery without angina pectoris: Secondary | ICD-10-CM | POA: Diagnosis not present

## 2022-08-20 DIAGNOSIS — Z7984 Long term (current) use of oral hypoglycemic drugs: Secondary | ICD-10-CM | POA: Diagnosis not present

## 2022-08-20 DIAGNOSIS — U071 COVID-19: Secondary | ICD-10-CM | POA: Insufficient documentation

## 2022-08-20 DIAGNOSIS — Z794 Long term (current) use of insulin: Secondary | ICD-10-CM | POA: Insufficient documentation

## 2022-08-20 DIAGNOSIS — E119 Type 2 diabetes mellitus without complications: Secondary | ICD-10-CM | POA: Insufficient documentation

## 2022-08-20 DIAGNOSIS — R059 Cough, unspecified: Secondary | ICD-10-CM | POA: Diagnosis present

## 2022-08-20 DIAGNOSIS — R051 Acute cough: Secondary | ICD-10-CM

## 2022-08-20 LAB — COMPREHENSIVE METABOLIC PANEL
ALT: 20 U/L (ref 0–44)
AST: 27 U/L (ref 15–41)
Albumin: 3.7 g/dL (ref 3.5–5.0)
Alkaline Phosphatase: 60 U/L (ref 38–126)
Anion gap: 8 (ref 5–15)
BUN: 22 mg/dL (ref 8–23)
CO2: 23 mmol/L (ref 22–32)
Calcium: 8.5 mg/dL — ABNORMAL LOW (ref 8.9–10.3)
Chloride: 107 mmol/L (ref 98–111)
Creatinine, Ser: 1.64 mg/dL — ABNORMAL HIGH (ref 0.61–1.24)
GFR, Estimated: 42 mL/min — ABNORMAL LOW (ref >60)
Glucose, Bld: 190 mg/dL — ABNORMAL HIGH (ref 70–99)
Potassium: 5.1 mmol/L (ref 3.5–5.1)
Sodium: 138 mmol/L (ref 135–145)
Total Bilirubin: 1 mg/dL (ref 0.3–1.2)
Total Protein: 7.3 g/dL (ref 6.5–8.1)

## 2022-08-20 LAB — CBC WITH DIFFERENTIAL/PLATELET
Abs Immature Granulocytes: 0.03 10*3/uL (ref 0.00–0.07)
Basophils Absolute: 0 10*3/uL (ref 0.0–0.1)
Basophils Relative: 0 %
Eosinophils Absolute: 0.1 10*3/uL (ref 0.0–0.5)
Eosinophils Relative: 1 %
HCT: 42.5 % (ref 39.0–52.0)
Hemoglobin: 13.6 g/dL (ref 13.0–17.0)
Immature Granulocytes: 0 %
Lymphocytes Relative: 16 %
Lymphs Abs: 1.2 10*3/uL (ref 0.7–4.0)
MCH: 32.5 pg (ref 26.0–34.0)
MCHC: 32 g/dL (ref 30.0–36.0)
MCV: 101.4 fL — ABNORMAL HIGH (ref 80.0–100.0)
Monocytes Absolute: 0.7 10*3/uL (ref 0.1–1.0)
Monocytes Relative: 9 %
Neutro Abs: 5.3 10*3/uL (ref 1.7–7.7)
Neutrophils Relative %: 74 %
Platelets: 149 10*3/uL — ABNORMAL LOW (ref 150–400)
RBC: 4.19 MIL/uL — ABNORMAL LOW (ref 4.22–5.81)
RDW: 15.8 % — ABNORMAL HIGH (ref 11.5–15.5)
WBC: 7.3 10*3/uL (ref 4.0–10.5)
nRBC: 0 % (ref 0.0–0.2)

## 2022-08-20 LAB — RESP PANEL BY RT-PCR (FLU A&B, COVID) ARPGX2
Influenza A by PCR: NEGATIVE
Influenza B by PCR: NEGATIVE
SARS Coronavirus 2 by RT PCR: POSITIVE — AB

## 2022-08-20 LAB — TROPONIN I (HIGH SENSITIVITY): Troponin I (High Sensitivity): 17 ng/L (ref <18)

## 2022-08-20 LAB — BRAIN NATRIURETIC PEPTIDE: B Natriuretic Peptide: 922.2 pg/mL — ABNORMAL HIGH (ref 0.0–100.0)

## 2022-08-20 MED ORDER — AZITHROMYCIN 250 MG PO TABS: ORAL_TABLET | ORAL | 0 refills | Status: DC

## 2022-08-20 MED ORDER — IPRATROPIUM-ALBUTEROL 0.5-2.5 (3) MG/3ML IN SOLN
3.0000 mL | Freq: Once | RESPIRATORY_TRACT | Status: AC
  Administered 2022-08-20: 3 mL via RESPIRATORY_TRACT
  Filled 2022-08-20: qty 3

## 2022-08-20 MED ORDER — MOLNUPIRAVIR EUA 200MG CAPSULE: 4.0000 | ORAL_CAPSULE | Freq: Once | ORAL | Status: DC

## 2022-08-20 MED ORDER — MOLNUPIRAVIR 200 MG PO CAPS: 4.0000 | ORAL_CAPSULE | Freq: Two times a day (BID) | ORAL | 0 refills | Status: DC

## 2022-08-20 MED ORDER — BENZONATATE 100 MG PO CAPS: 200.0000 mg | ORAL_CAPSULE | Freq: Three times a day (TID) | ORAL | 0 refills | Status: DC | PRN

## 2022-08-20 MED ORDER — PREDNISONE 20 MG PO TABS: 40.0000 mg | ORAL_TABLET | Freq: Every day | ORAL | 0 refills | Status: AC

## 2022-08-20 MED ORDER — ALBUTEROL SULFATE HFA 108 (90 BASE) MCG/ACT IN AERS: 2.0000 | INHALATION_SPRAY | Freq: Four times a day (QID) | RESPIRATORY_TRACT | 2 refills | Status: DC | PRN

## 2022-08-20 MED ORDER — MOLNUPIRAVIR 200 MG PO CAPS: 4.0000 | ORAL_CAPSULE | Freq: Two times a day (BID) | ORAL | 0 refills | Status: AC

## 2022-08-20 MED ORDER — PREDNISONE 20 MG PO TABS: 40.0000 mg | ORAL_TABLET | Freq: Every day | ORAL | 0 refills | Status: DC

## 2022-08-20 MED ORDER — PREDNISONE 20 MG PO TABS
40.0000 mg | ORAL_TABLET | Freq: Once | ORAL | Status: AC
  Administered 2022-08-20: 40 mg via ORAL
  Filled 2022-08-20: qty 2

## 2022-08-20 NOTE — Discharge Instructions (Addendum)
Please take medications as prescribed.  Make sure you are drinking lots of fluids.  Return to the ER for any oxygen saturations lower than 90, worsening shortness of breath, fevers or any urgent changes in your health.

## 2022-08-20 NOTE — ED Provider Notes (Signed)
[facility] EMERGENCY DEPARTMENT Provider Note   CSN: [id number] Arrival date & time: 08/20/22  1448     History  Chief Complaint  Patient presents with   Shortness of Breath   Cough    Andres Gandy Sr. is a 81 y.o. male with history of atrial fibrillation, diabetes, congestive heart failure and coronary artery disease presents to the emergency department for evaluation of cough and shortness of breath.  Patient was sick 4 to 5 weeks ago, several people were diagnosed with COVID at a party, he denies any testing but states his symptoms did resolve.  Over the last week he has had reoccurring cough, dry nonproductive with no chest pain and occasionally he will feel like he is having a hard time breathing.  He denies any fevers, chest pain, shortness of breath at this time.  No swelling of his lower legs.  HPI     Home Medications Prior to Admission medications   Medication Sig Start Date End Date Taking? Authorizing Provider  albuterol (VENTOLIN HFA) 108 (90 Base) MCG/ACT inhaler Inhale 2 puffs into the lungs every 6 (six) hours as needed for wheezing or shortness of breath. 08/20/22  Yes Duanne Guess, PA-C  benzonatate (TESSALON PERLES) 100 MG capsule Take 2 capsules (200 mg total) by mouth 3 (three) times daily as needed for cough. 08/20/22  Yes Duanne Guess, PA-C  molnupiravir EUA (LAGEVRIO) 200 MG CAPS capsule Take 4 capsules (800 mg total) by mouth 2 (two) times daily for 5 days. 08/20/22 08/25/22 Yes Duanne Guess, PA-C  predniSONE (DELTASONE) 20 MG tablet Take 2 tablets (40 mg total) by mouth daily with breakfast for 4 days. 08/20/22 08/24/22 Yes Duanne Guess, PA-C  amiodarone (PACERONE) 200 MG tablet TAKE 1 TABLET BY MOUTH DAILY 07/18/22   [name], MD  Cinnamon 500 MG TABS Take 1,000 mg by mouth 2 (two) times daily.     [provider]  Coenzyme Q10 (CO Q 10 PO) Take 300 mg by mouth daily.    [provider]  digoxin  (LANOXIN) 0.125 MG tablet TAKE ONE-HALF TABLET BY MOUTH  DAILY 03/30/22   Deboraha Sprang, MD  diphenhydramine-acetaminophen (TYLENOL PM) 25-500 MG TABS tablet Take 2 tablets by mouth at bedtime as needed.    [provider]  eplerenone (INSPRA) 25 MG tablet TAKE ONE-HALF TABLET BY MOUTH  DAILY 08/15/22   Deboraha Sprang, MD  fluticasone [facility]) 50 MCG/ACT nasal spray Place 1 spray into the nose daily. 06/06/18   [provider]  folic acid (FOLVITE) 1 MG tablet Take 1 mg by mouth daily.     [provider]  Garlic 7035 MG CAPS Take 1 capsule by mouth daily.     [provider]  Glucosamine-Chondroit-Vit C-Mn (GLUCOSAMINE CHONDR 1500 COMPLX PO) Take 1,500 mg by mouth 2 (two) times daily.     [provider]  insulin NPH Human (HUMULIN N,NOVOLIN N) 100 UNIT/ML injection Inject 95 Units into the skin at bedtime.     [provider]  insulin regular (NOVOLIN R,HUMULIN R) 100 units/mL injection Inject 30-40 Units into the skin 3 (three) times daily before meals.    [provider]  levocetirizine (XYZAL) 5 MG tablet Take 5 mg by mouth every evening.    [provider]  levothyroxine (SYNTHROID) 75 MCG tablet Take by mouth daily. 01/18/22   [provider]  meclizine (ANTIVERT) 25 MG tablet Take 25 mg by  mouth 3 (three) times daily as needed for dizziness. 09/15/20   [provider]  metFORMIN (GLUCOPHAGE) 500 MG tablet Take 500 mg tablet in the am with 0.5 tablet in the pm.    [provider]  methotrexate (RHEUMATREX) 2.5 MG tablet Take 5 mg tablets on Wednesday.Caution:Chemotherapy. Protect from light.    [provider]  metoprolol succinate (TOPROL-XL) 25 MG 24 hr tablet Take 1 tablet (25 mg total) by mouth at bedtime. 05/02/22   Deboraha Sprang, MD  mometasone (ELOCON) 0.1 % cream Apply 1 application topically daily as needed (Rash). 05/16/21   Ralene Bathe, MD  Omega-3 Fatty Acids (FISH OIL  PO) Take by mouth 2 (two) times daily.     [provider]  rosuvastatin (CRESTOR) 10 MG tablet Take 10 mg by mouth daily.    [provider]  sacubitril-valsartan (ENTRESTO) 24-26 MG Take 1 tablet by mouth 2 (two) times daily. 03/24/22   Deboraha Sprang, MD  XARELTO 15 MG TABS tablet TAKE 1 TABLET BY MOUTH ONCE DAILY WITH A MEAL 08/04/22   Deboraha Sprang, MD      Allergies    Patient has no known allergies.    Review of Systems   Review of Systems  Physical Exam Updated Vital Signs BP 118/60   Pulse (!) 55   Temp 98.1 F (36.7 C) (Oral)   Resp 17   Ht '5\' 10"'$  (1.778 m)   Wt 96.6 kg   SpO2 95%   BMI 30.56 kg/m  Physical Exam Constitutional:      Appearance: He is well-developed.  HENT:     Head: Normocephalic and atraumatic.     Right Ear: Tympanic membrane, ear canal and external ear normal.     Left Ear: Tympanic membrane, ear canal and external ear normal.     Mouth/Throat:     Mouth: Mucous membranes are moist.     Pharynx: No oropharyngeal exudate or posterior oropharyngeal erythema.  Eyes:     Conjunctiva/sclera: Conjunctivae normal.  Cardiovascular:     Rate and Rhythm: Normal rate.  Pulmonary:     Effort: Pulmonary effort is normal. No respiratory distress.     Breath sounds: Normal breath sounds. No stridor. No rhonchi or rales.  Abdominal:     General: Bowel sounds are normal. There is no distension.     Tenderness: There is no abdominal tenderness. There is no guarding.  Musculoskeletal:        General: Normal range of motion.     Cervical back: Normal range of motion.  Skin:    General: Skin is warm.     Findings: No rash.  Neurological:     General: No focal deficit present.     Mental Status: He is alert and oriented to person, place, and time.  Psychiatric:        Behavior: Behavior normal.        Thought Content: Thought content normal.     ED Results / Procedures / Treatments   Labs (all labs ordered are listed, but only  abnormal results are displayed) Labs Reviewed  RESP PANEL BY RT-PCR (FLU A&B, COVID) ARPGX2 - Abnormal; Notable for the following components:      Result Value   SARS Coronavirus 2 by RT PCR POSITIVE (*)    All other components within normal limits  CBC WITH DIFFERENTIAL/PLATELET - Abnormal; Notable for the following components:   RBC 4.19 (*)    MCV 101.4 (*)  RDW 15.8 (*)    Platelets 149 (*)    All other components within normal limits  COMPREHENSIVE METABOLIC PANEL - Abnormal; Notable for the following components:   Glucose, Bld 190 (*)    Creatinine, Ser 1.64 (*)    Calcium 8.5 (*)    GFR, Estimated 42 (*)    All other components within normal limits  BRAIN NATRIURETIC PEPTIDE - Abnormal; Notable for the following components:   B Natriuretic Peptide 922.2 (*)    All other components within normal limits  TROPONIN I (HIGH SENSITIVITY)    EKG None  Radiology DG Chest 2 View  Result Date: 08/20/2022 CLINICAL DATA:  Cough short of breath EXAM: CHEST - 2 VIEW COMPARISON:  11/01/2015 FINDINGS: Post sternotomy changes. Left-sided pacing device as before. Cardiomegaly. Aortic atherosclerosis. No consolidation. Trace left pleural effusion or thickening. No pneumothorax. IMPRESSION: 1. Small left effusion versus pleural thickening. 2. Cardiomegaly. Electronically Signed   By: Donavan Foil M.D.   On: 08/20/2022 15:54    Procedures Procedures   Medications Ordered in ED Medications  ipratropium-albuterol (DUONEB) 0.5-2.5 (3) MG/3ML nebulizer solution 3 mL (3 mLs Nebulization Given 08/20/22 1600)  predniSONE (DELTASONE) tablet 40 mg (40 mg Oral Given 08/20/22 1716)    ED Course/ Medical Decision Making/ A&P                           Medical Decision Making Amount and/or Complexity of Data Reviewed Labs: ordered.  Risk Prescription drug management.    81 year old male with cough, shortness of breath.  Shortness of breath occurred earlier today.  Currently no shortness of  breath and oxygen saturations as well as vital signs remained stable.  Blood work normal, chest x-ray showing no significant acute cardiopulmonary process.  Patient appears well in no respiratory distress.  He was able to walk a lap around the nurses station with stable O2 saturations around 94.  He is COVID-positive.  He has been fully vaccinated.  Will place on monitor.  We are due to contraindications with Paxlovid, prednisone, Tessalon Perles.  He understands signs symptoms return to urgent care for.  Final Clinical Impression(s) / ED Diagnoses Final diagnoses:  Acute cough  COVID    Rx / DC Orders ED Discharge Orders          Ordered    predniSONE (DELTASONE) 20 MG tablet  Daily with breakfast        08/20/22 1727    molnupiravir EUA (LAGEVRIO) 200 MG CAPS capsule  2 times daily        08/20/22 1727    albuterol (VENTOLIN HFA) 108 (90 Base) MCG/ACT inhaler  Every 6 hours PRN        08/20/22 1727    benzonatate (TESSALON PERLES) 100 MG capsule  3 times daily PRN        08/20/22 1727    azithromycin (ZITHROMAX Z-PAK) 250 MG tablet  Status:  Discontinued        08/20/22 1728              Duanne Guess, PA-C 08/20/22 1731    Nance Pear, MD 08/20/22 [phone]

## 2022-08-20 NOTE — ED Triage Notes (Signed)
Patient c/o cough that started Friday and sob today. Reports recently around people diagnosed with covid.

## 2022-08-21 ENCOUNTER — Ambulatory Visit (INDEPENDENT_AMBULATORY_CARE_PROVIDER_SITE_OTHER): Payer: Medicare Other

## 2022-08-21 DIAGNOSIS — I255 Ischemic cardiomyopathy: Secondary | ICD-10-CM

## 2022-08-22 LAB — CUP PACEART REMOTE DEVICE CHECK
Battery Remaining Longevity: 64 mo
Battery Remaining Percentage: 62 %
Battery Voltage: 2.96 V
Brady Statistic RV Percent Paced: 18 %
Date Time Interrogation Session: 20230925020021
HighPow Impedance: 64 Ohm
HighPow Impedance: 64 Ohm
Implantable Lead Implant Date: 20130403
Implantable Lead Location: 753860
Implantable Pulse Generator Implant Date: 20190422
Lead Channel Impedance Value: 360 Ohm
Lead Channel Pacing Threshold Amplitude: 1 V
Lead Channel Pacing Threshold Pulse Width: 0.5 ms
Lead Channel Sensing Intrinsic Amplitude: 11.7 mV
Lead Channel Setting Pacing Amplitude: 2.5 V
Lead Channel Setting Pacing Pulse Width: 0.5 ms
Lead Channel Setting Sensing Sensitivity: 0.5 mV
Pulse Gen Serial Number: 9811176

## 2022-08-24 ENCOUNTER — Ambulatory Visit: Payer: Medicare Other | Admitting: Cardiovascular Disease

## 2022-08-24 NOTE — Progress Notes (Deleted)
Cardiology Office Note   Date:  08/24/2022   ID:  Andres Gandy Sr., DOB 09-07-1941, MRN [id number]  PCP:  [name], MD  Cardiologist:   Kathlyn Sacramento, MD   No chief complaint on file.      History of Present Illness: Andres TORAN Sr. is a 81 y.o. male who presents for a follow up regarding chronic systolic heart failure, chronic atrial fibrillation and coronary artery disease.   He had previous one-vessel CABG in 1994 after failed angioplasty on the left circumflex complicated by dissection.   Cardiac catheterization in December 2016 showed occluded SVG to OM 3. The mid left circumflex had diffuse 90% disease into a small OM3 which was also diffusely diseased and relatively small in size. EF was 15% with mildly elevated LVEDP. I recommended medical therapy. He had previous ventricular tachycardia and syncope that was successfully treated with ICD shock with subsequent adjustment of his device to deliver ATP.   He had ICD generator replacement in April 2019. He was hospitalized in June 2021 with syncope and was found to have ventricular tachycardia treated with ATP.  CT scan showed subdural hematoma which necessitated holding Xarelto at that time.  At that time, he was transitioned to [facility] from ACE inhibitor.  Digoxin was added  due to episodes of A. fib with RVR.  In addition, the dose of Toprol was increased to 25 mg twice daily.  He had recurrent ventricular tachycardia in early 2022 thus underwent cardiac catheterization in February 2022 which showed significant underlying one-vessel coronary artery disease affecting the mid to distal left circumflex into OM 3 with chronically occluded SVG to OM 3 and stable moderate mid LAD disease.  Overall, no significant change from most recent cardiac catheterization in 2016.  LVEDP was normal.  He was treated with amiodarone.  Metoprolol was discontinued due to bradycardia.  He had dizziness with Jardiance and thus  furosemide was discontinued.  However, he continued to have dizziness and Jardiance was discontinued.  Echocardiogram in March 2022 showed an EF of 25 to 30% with mild mitral regurgitation.  He went to the emergency room earlier this week with increased shortness of breath and cough.  He had exposure to COVID-19.  He tested positive for COVID  Unfortunately, his wife continues to deal with advanced breast cancer.  He has been under significant stress as a result.  He reports stable exertional dyspnea with no chest pain.  In spite of stopping furosemide, he does not have leg edema and his weight has been relatively stable.  He does have underlying chronic kidney disease with most recent creatinine of 1.7.  Past Medical History:  Diagnosis Date   Actinic keratosis    Arthritis    Atrial fibrillation, persistent-long-term    Chronic systolic congestive heart failure ([place])    a. 03/2015 Echo: EF 30-35%, diff HK, mild MR, mod dil LA, mildly dil RA, mild TR.   Coronary artery disease    a. 1994 s/p CABG x 1 (VG->OM3);  b. 10/2015 Cath: LM nl, LAD min irregs, D1 nl, D2 min irregs, LCX 71m OM2 nl, OM3 90 (small territory), VG->OM3 100, RCA min irregs, RPDA/RPL1/RPL2/RPL3 nl, EF 25%-->Med Rx.   Dysrhythmia    Hyperlipidemia    ICD (implantable cardiac defibrillator) in place    a. 02/2012 s/p SJM 1257-40Q Fortify Assurance single lead AICD.   Mixed Ischemic and Non-ischemic Cardiomyopathy    a. 07/2012 s/p SJM AICD (RV lead only);  b.  03/2015 Echo: EF 30-35%, diff HK;  c. EF 25% by LV gram.   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 07/2012 s/ SJM AICD.    Past Surgical History:  Procedure Laterality Date   basel cell removal     left arm   CARDIAC CATHETERIZATION   02/28/2012   Minor disease in LAD and RCA, LCX: 70% mid and occluded distally. Patent SVG to OM3   CARDIAC CATHETERIZATION N/A 11/08/2015   Procedure: Left Heart Cath and Cors/Grafts Angiography;  Surgeon: [name], MD;  Location: [place] [facility];  Service: Cardiovascular;  Laterality: N/A;   CARDIOVERSION  03/01/2012   Procedure: CARDIOVERSION;  Surgeon: Deboraha Sprang, MD;  Location: [facility] OR;  Service: Cardiovascular;  Laterality: N/A;   CATARACT EXTRACTION W/ INTRAOCULAR LENS  IMPLANT, BILATERAL  2011   CORONARY ARTERY BYPASS GRAFT  1994   Andres ; CABG X1   ELECTROPHYSIOLOGY STUDY N/A 02/28/2012   Procedure: ELECTROPHYSIOLOGY STUDY;  Surgeon: Deboraha Sprang, MD;  Location: [facility] CATH LAB;  Service: Cardiovascular;  Laterality: N/A;   ICD GENERATOR CHANGEOUT N/A 03/18/2018   Procedure: [place];  Surgeon: Evans Lance, MD;  Location: [place] [facility];  Service: Cardiovascular;  Laterality: N/A;   INSERT / REPLACE / REMOVE PACEMAKER  02/28/12   "w/defibrillator"   JOINT REPLACEMENT     KNEE ARTHROSCOPY  2012   right   LEFT HEART CATH AND CORONARY ANGIOGRAPHY N/A 01/10/2021   Procedure: LEFT HEART CATH AND CORONARY ANGIOGRAPHY;  Surgeon: [name], MD;  Location: [place] [facility];  Service: Cardiovascular;  Laterality: N/A;   LEFT HEART CATHETERIZATION WITH CORONARY/GRAFT ANGIOGRAM N/A 02/28/2012   Procedure: LEFT HEART CATHETERIZATION WITH [name];  Surgeon: [name], MD;  Location: [place] CATH LAB;  Service: Cardiovascular;  Laterality: N/A;   TOTAL KNEE ARTHROPLASTY  2011   left     Current Outpatient Medications  Medication Sig Dispense Refill   albuterol (VENTOLIN HFA) 108 (90 Base) MCG/ACT inhaler Inhale 2 puffs into the lungs every 6 (six) hours as needed for wheezing or shortness of breath. 8 g 2   amiodarone (PACERONE) 200 MG tablet TAKE 1 TABLET BY MOUTH DAILY 90 tablet 3   benzonatate (TESSALON PERLES) 100 MG capsule Take 2  capsules (200 mg total) by mouth 3 (three) times daily as needed for cough. 30 capsule 0   Cinnamon 500 MG TABS Take 1,000 mg by mouth 2 (two) times daily.      Coenzyme Q10 (CO Q 10 PO) Take 300 mg by mouth daily.     digoxin (LANOXIN) 0.125 MG tablet TAKE ONE-HALF TABLET BY MOUTH  DAILY 45 tablet 2   diphenhydramine-acetaminophen (TYLENOL PM) 25-500 MG TABS tablet Take 2 tablets by mouth at bedtime as needed.     eplerenone (INSPRA) 25 MG tablet TAKE ONE-HALF TABLET BY MOUTH  DAILY 45 tablet 3   fluticasone (FLONASE) 50 MCG/ACT nasal spray Place 1 spray into the nose daily.     folic acid (FOLVITE) 1 MG tablet Take 1 mg by mouth daily.      Garlic 9470 MG CAPS Take 1 capsule by mouth daily.  Glucosamine-Chondroit-Vit C-Mn (GLUCOSAMINE CHONDR 1500 COMPLX PO) Take 1,500 mg by mouth 2 (two) times daily.      insulin NPH Human (HUMULIN N,NOVOLIN N) 100 UNIT/ML injection Inject 95 Units into the skin at bedtime.      insulin regular (NOVOLIN R,HUMULIN R) 100 units/mL injection Inject 30-40 Units into the skin 3 (three) times daily before meals.     levocetirizine (XYZAL) 5 MG tablet Take 5 mg by mouth every evening.     levothyroxine (SYNTHROID) 75 MCG tablet Take by mouth daily.     meclizine (ANTIVERT) 25 MG tablet Take 25 mg by mouth 3 (three) times daily as needed for dizziness.     metFORMIN (GLUCOPHAGE) 500 MG tablet Take 500 mg tablet in the am with 0.5 tablet in the pm.     methotrexate (RHEUMATREX) 2.5 MG tablet Take 5 mg tablets on Wednesday.Caution:Chemotherapy. Protect from light.     metoprolol succinate (TOPROL-XL) 25 MG 24 hr tablet Take 1 tablet (25 mg total) by mouth at bedtime. 90 tablet 1   molnupiravir EUA (LAGEVRIO) 200 MG CAPS capsule Take 4 capsules (800 mg total) by mouth 2 (two) times daily for 5 days. 40 capsule 0   mometasone (ELOCON) 0.1 % cream Apply 1 application topically daily as needed (Rash). 45 g 0   Omega-3 Fatty Acids (FISH OIL PO) Take by mouth 2 (two) times  daily.      predniSONE (DELTASONE) 20 MG tablet Take 2 tablets (40 mg total) by mouth daily with breakfast for 4 days. 8 tablet 0   rosuvastatin (CRESTOR) 10 MG tablet Take 10 mg by mouth daily.     sacubitril-valsartan (ENTRESTO) 24-26 MG Take 1 tablet by mouth 2 (two) times daily. 180 tablet 1   XARELTO 15 MG TABS tablet TAKE 1 TABLET BY MOUTH ONCE DAILY WITH A MEAL 90 tablet 3   No current facility-administered medications for this visit.    Allergies:   Patient has no known allergies.    Social History:  The patient  reports that he quit smoking about 34 years ago. His smoking use included cigarettes. He has a 25.00 pack-year smoking history. He has never used smokeless tobacco. He reports that he does not drink alcohol and does not use drugs.   Family History:  The patient's family history includes Heart attack in his brother and father.    ROS:  Please see the history of present illness.   Otherwise, review of systems are positive for none.   All other systems are reviewed and negative.    PHYSICAL EXAM: VS:  There were no vitals taken for this visit. , BMI There is no height or weight on file to calculate BMI. GEN: Well nourished, well developed, in no acute distress  HEENT: normal  Neck: no JVD, carotid bruits, or masses Cardiac: Irregularly irregular; no murmurs, rubs, or gallops,no edema  Respiratory:  clear to auscultation bilaterally, normal work of breathing GI: soft, nontender, nondistended, + BS MS: no deformity or atrophy  Skin: warm and dry, no rash Neuro:  Strength and sensation are intact Psych: euthymic mood, full affect   EKG:  EKG is ordered today. The ekg ordered today demonstrates atrial fibrillation with ventricular rate of 57 bpm.  Nonspecific IVCD.  Recent Labs: 08/20/2022: ALT 20; B Natriuretic Peptide 922.2; BUN 22; Creatinine, Ser 1.64; Hemoglobin 13.6; Platelets 149; Potassium 5.1; Sodium 138    Lipid Panel No results found for: "CHOL", "TRIG",  "HDL", "CHOLHDL", "VLDL", "LDLCALC", "LDLDIRECT"  Wt Readings from Last 3 Encounters:  08/20/22 213 lb (96.6 kg)  06/27/22 209 lb (94.8 kg)  02/28/22 215 lb (97.5 kg)      ASSESSMENT AND PLAN:  1.  Chronic systolic heart failure: Most recent ejection fraction was 20-25 %.  He reports stable exertional dyspnea.  In spite of stopping furosemide, he does not appear volume overloaded.  Continue Entresto, digoxin and eplerenone.  He did not tolerate Jardiance due to dizziness and was taken off beta-blockers due to bradycardia.    2. Coronary artery disease: Mainly affecting the left circumflex supplying a small territory.  Continue medical therapy.  Most recent cardiac catheterization showed stable coronary anatomy.  3. Chronic atrial fibrillation: Continue rate control with digoxin. Continue anticoagulation with Xarelto.  Given underlying chronic kidney disease, I will consider stopping digoxin and probably adding small dose carvedilol in the near future.  Most recent digoxin level was 0.5.  Therefore he elected to continue digoxin, we will have to check his level next time.  4. Diabetes mellitus:  Managed by endocrinology.  5. Hyperlipidemia: He is not able to tolerate higher doses of statins but is able to take small dose rosuvastatin.  He takes 10 mg alternating with 5 mg.  6.  Recurrent ventricular tachycardia: The patient has an ICD in place.  He is on amiodarone for this.   Disposition:   FU with me in 6 months  Signed,  Kathlyn Sacramento, MD  08/24/2022 9:17 AM    [place]

## 2022-08-29 ENCOUNTER — Encounter: Payer: Self-pay | Admitting: Internal Medicine

## 2022-08-29 ENCOUNTER — Ambulatory Visit: Payer: Medicare Other | Attending: Internal Medicine | Admitting: Internal Medicine

## 2022-08-29 VITALS — BP 134/75 | HR 56 | Ht 70.0 in | Wt 213.0 lb

## 2022-08-29 DIAGNOSIS — Z79899 Other long term (current) drug therapy: Secondary | ICD-10-CM

## 2022-08-29 DIAGNOSIS — I4821 Permanent atrial fibrillation: Secondary | ICD-10-CM

## 2022-08-29 DIAGNOSIS — I472 Ventricular tachycardia, unspecified: Secondary | ICD-10-CM | POA: Diagnosis not present

## 2022-08-29 DIAGNOSIS — I255 Ischemic cardiomyopathy: Secondary | ICD-10-CM

## 2022-08-29 DIAGNOSIS — E785 Hyperlipidemia, unspecified: Secondary | ICD-10-CM

## 2022-08-29 DIAGNOSIS — I509 Heart failure, unspecified: Secondary | ICD-10-CM | POA: Insufficient documentation

## 2022-08-29 DIAGNOSIS — Z95810 Presence of automatic (implantable) cardiac defibrillator: Secondary | ICD-10-CM | POA: Diagnosis not present

## 2022-08-29 DIAGNOSIS — I5022 Chronic systolic (congestive) heart failure: Secondary | ICD-10-CM

## 2022-08-29 MED ORDER — FUROSEMIDE 40 MG PO TABS: ORAL_TABLET | ORAL | 0 refills | Status: DC

## 2022-08-29 NOTE — Patient Instructions (Addendum)
Medication Instructions:  - Your physician has recommended you make the following change in your medication:   1) STOP Digoxin (lanoxin)  2) START Lasix (furosemide) 40 mg: - take 1 tablet by mouth EVERY OTHER day x 3 doses - then take 1 tablet daily as needed for shortness of breath  *If you need a refill on your cardiac medications before your next appointment, please call your pharmacy*   Lab Work: - Your physician recommends that you return for lab work at your convenience:  [place] at [facility] 1st desk on the right to check in (REGISTRATION)  Lab hours: Monday- Friday (7:30 am- 5:30 pm)   If you have labs (blood work) drawn today and your tests are completely normal, you will receive your results only by: MyChart Message (if you have MyChart) OR A paper copy in the mail If you have any lab test that is abnormal or we need to change your treatment, we will call you to review the results.   Testing/Procedures: - none ordered   Follow-Up: At [facility], you and your health needs are our priority.  As part of our continuing mission to provide you with exceptional heart care, we have created designated Provider Care Teams.  These Care Teams include your primary Cardiologist (physician) and Advanced Practice Providers (APPs -  Physician Assistants and Nurse Practitioners) who all work together to provide you with the care you need, when you need it.  We recommend signing up for the patient portal called "MyChart".  Sign up information is provided on this After Visit Summary.  MyChart is used to connect with patients for Virtual Visits (Telemedicine).  Patients are able to view lab/test results, encounter notes, upcoming appointments, etc.  Non-urgent messages can be sent to your provider as well.   To learn more about what you can do with MyChart, go to [email or web address].    Your next appointment:   1) 3 months with the PA/ NP   2) 9 months with  Dr. Caryl Comes  The format for your next appointment:   In Person  Provider:   As above     Other Instructions N/a  Important Information About Sugar

## 2022-08-29 NOTE — Progress Notes (Signed)
Patient ID: Andres [name]., male   DOB: 09-15-1941, 81 y.o.   MRN: [id number]        Patient Care Team: Andres Late, MD as PCP - General (Family Medicine) Andres Sprang, MD as PCP - Electrophysiology (Cardiology) Andres Hampshire, MD as PCP - Cardiology (Cardiology)   HPI  Andres Gandy Sr. is a 81 y.o. 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 bypass had been  done emergently in [place] generator replacement 4/19 (GT) Permanent atrial fibrillation on Xarelto without bleeding   Interval syncope assoc with rapid pace terminated VT     Appropriate Therapy yes  VT monomorphic 5/18 Cl 220--240 and 7/21 assoc with syncope and complicated by subdural hematoma Recurrent VT 12/21 monomorphic and treated with ATP successfully Now on amiodarone Patient denies symptoms of respiratory, GI intolerance, sun sensitivity,   attributable to amiodarone.  He describes orthostatic intolerance, specifically a wobbliness that occurs upon standing, it can resolve over a couple minutes if he just stands still.  Does not occur sitting.  Dyspnea on exertion is much improved, he is recently diagnosed with COVID 2 Paxlovid was very short of breath hypoxic but is better now.  Has noted some peripheral edema no orthopnea or nocturnal dyspnea  He was switched to [facility] for the adjunctive benefit of decreased ventricular tachycardia   His wife died 04-25-23.  35 years.  Name Andres [name].  Life is very very hard, lonely, not going to church does not really want to be around people His daughter remains upstairs    DATE TEST EF%   2011  Echo [place]  14 %   12/16 LHC  15 % SVG-OM T; LAD/RCA non obstru  8/20 Echo  30-35% LAE mod  7/21 Echo  20-25%   02/22 LHC   SVG-OM3 T; CXm-90% LAD/RCA nonobstructive  3/22 Echo  25-30%      Date Cr K TSH LFT Dig Hgb  4/18  1.4 5.2       4/19 0.36 4.2    14.3  5/20 1.3 4.8 (2/20)    15.6  7/21 1.36 4.5    16.0  12/21 1.47 4.1 5.49 (CE)  19 0.6(10/21) 14.9  5/22(CE) 1.9 4.7 9.68(4/22) 15 0.5 (7/22) 15.1  3/23 (CE)  1.7 4.7 4.67 17 (1/23)  14.6 (1/23)  9/23 1.64 5.1 6.08 20 0.4 13.6        Thromboembolic risk factors ( age  -2, HTN-1, DM-1, Vasc disease -1, CHF-1) for a CHADSVASc Score of >=6   Past Medical History:  Diagnosis Date   Actinic keratosis    Arthritis    Atrial fibrillation, persistent-long-term    Chronic systolic congestive heart failure ([place])    a. 03/2015 Echo: EF 30-35%, diff HK, mild MR, mod dil LA, mildly dil RA, mild TR.   Coronary artery disease    a. 1994 s/p CABG x 1 (VG->OM3);  b. 10/2015 Cath: LM nl, LAD min irregs, D1 nl, D2 min irregs, LCX 83m OM2 nl, OM3 90 (small territory), VG->OM3 100, RCA min irregs, RPDA/RPL1/RPL2/RPL3 nl, EF 25%-->Med Rx.   Dysrhythmia    Hyperlipidemia    ICD (implantable cardiac defibrillator) in place    a. 02/2012 s/p SJM 1257-40Q Fortify Assurance single lead AICD.   Mixed Ischemic and Non-ischemic Cardiomyopathy    a. 07/2012 s/p SJM AICD (  RV lead only);  b. 03/2015 Echo: EF 30-35%, diff HK;  c. EF 25% by LV gram.   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 07/2012 s/ SJM AICD.    Past Surgical History:  Procedure Laterality Date   basel cell removal     left arm   CARDIAC CATHETERIZATION  02/28/2012   Minor disease in LAD and RCA, LCX: 70% mid and occluded distally. Patent SVG to OM3   CARDIAC CATHETERIZATION N/A 11/08/2015   Procedure: Left Heart Cath and Cors/Grafts Angiography;  Surgeon: Andres Hampshire, MD;  Location: [place] [facility] LAB;  Service: Cardiovascular;   Laterality: N/A;   CARDIOVERSION  03/01/2012   Procedure: CARDIOVERSION;  Surgeon: Andres Sprang, MD;  Location: [facility] OR;  Service: Cardiovascular;  Laterality: N/A;   CATARACT EXTRACTION W/ INTRAOCULAR LENS  IMPLANT, BILATERAL  2011   CORONARY ARTERY BYPASS GRAFT  1994   [place] ; CABG X1   ELECTROPHYSIOLOGY STUDY N/A 02/28/2012   Procedure: ELECTROPHYSIOLOGY STUDY;  Surgeon: Andres Sprang, MD;  Location: [facility] CATH LAB;  Service: Cardiovascular;  Laterality: N/A;   ICD GENERATOR CHANGEOUT N/A 03/18/2018   Procedure: [place];  Surgeon: Evans Lance, MD;  Location: [place] CV LAB;  Service: Cardiovascular;  Laterality: N/A;   INSERT / REPLACE / REMOVE PACEMAKER  02/28/12   "w/defibrillator"   JOINT REPLACEMENT     KNEE ARTHROSCOPY  2012   right   LEFT HEART CATH AND CORONARY ANGIOGRAPHY N/A 01/10/2021   Procedure: LEFT HEART CATH AND CORONARY ANGIOGRAPHY;  Surgeon: Andres Hampshire, MD;  Location: [place] CV LAB;  Service: Cardiovascular;  Laterality: N/A;   LEFT HEART CATHETERIZATION WITH CORONARY/GRAFT ANGIOGRAM N/A 02/28/2012   Procedure: LEFT HEART CATHETERIZATION WITH [name];  Surgeon: Andres Hampshire, MD;  Location: [place] CATH LAB;  Service: Cardiovascular;  Laterality: N/A;   TOTAL KNEE ARTHROPLASTY  2011   left    Current Outpatient Medications  Medication Sig Dispense Refill   amiodarone (PACERONE) 200 MG tablet TAKE 1 TABLET BY MOUTH DAILY 90 tablet 3   Cinnamon 500 MG TABS Take 1,000 mg by mouth 2 (two) times daily.      Coenzyme Q10 (CO Q 10 PO) Take 300 mg by mouth daily.     digoxin (LANOXIN) 0.125 MG tablet TAKE ONE-HALF TABLET BY MOUTH  DAILY 45 tablet 2   eplerenone (INSPRA) 25 MG tablet TAKE ONE-HALF TABLET BY MOUTH  DAILY 45 tablet 3   fluticasone (FLONASE) 50 MCG/ACT nasal spray Place 1 spray into the nose as needed for allergies or rhinitis.     folic acid (FOLVITE) 1 MG tablet Take 1 mg by mouth daily.      Garlic 6834 MG CAPS Take 1  capsule by mouth daily.      Glucosamine-Chondroit-Vit C-Mn (GLUCOSAMINE CHONDR 1500 COMPLX PO) Take 1,500 mg by mouth 2 (two) times daily.      insulin NPH Human (HUMULIN N,NOVOLIN N) 100 UNIT/ML injection Inject 85 Units into the skin at bedtime.     insulin regular (NOVOLIN R,HUMULIN R) 100 units/mL injection Inject 20 Units into the skin in the morning.     levocetirizine (XYZAL) 5  MG tablet Take 5 mg by mouth every evening.     levothyroxine (SYNTHROID) 75 MCG tablet Take 75 mcg by mouth daily.     meclizine (ANTIVERT) 25 MG tablet Take 25 mg by mouth 3 (three) times daily as needed for dizziness.     metFORMIN (GLUCOPHAGE) 500 MG tablet Take 500 mg tablet in the am with 0.5 tablet in the pm.     metoprolol succinate (TOPROL-XL) 25 MG 24 hr tablet Take 1 tablet (25 mg total) by mouth at bedtime. 90 tablet 1   Omega-3 Fatty Acids (FISH OIL PO) Take 1 tablet by mouth 2 (two) times daily.     rosuvastatin (CRESTOR) 10 MG tablet Take 10 mg by mouth daily.     sacubitril-valsartan (ENTRESTO) 24-26 MG Take 1 tablet by mouth 2 (two) times daily. 180 tablet 1   XARELTO 15 MG TABS tablet TAKE 1 TABLET BY MOUTH ONCE DAILY WITH A MEAL 90 tablet 3   albuterol (VENTOLIN HFA) 108 (90 Base) MCG/ACT inhaler Inhale 2 puffs into the lungs every 6 (six) hours as needed for wheezing or shortness of breath. 8 g 2   benzonatate (TESSALON PERLES) 100 MG capsule Take 2 capsules (200 mg total) by mouth 3 (three) times daily as needed for cough. (Patient not taking: Reported on 08/29/2022) 30 capsule 0   mometasone (ELOCON) 0.1 % cream Apply 1 application topically daily as needed (Rash). (Patient not taking: Reported on 08/29/2022) 45 g 0   No current facility-administered medications for this visit.    No Known Allergies  Physical Exam: BP 123/70   Pulse (!) 56   Ht '5\' 10"'$  (1.778 m)   Wt 213 lb (96.6 kg)   SpO2 97%   BMI 30.56 kg/m  Well developed and well nourished in no acute distress HENT normal Neck  supple with JVP-7-8 cm   clear Device pocket well healed; without hematoma or erythema.  There is no tethering  Regular rate and rhythm, no  gallop No  murmur Abd-soft with active BS No Clubbing cyanosis 1+ edema Skin-warm and dry A & Oriented  Grossly normal sensory and motor function  ECG afib @ 56   Progressive right axis deviation now 138 9/22 about 105  Assessment and  Plan:  Congestive heart failure-acute/chronic-systolic  Atrial fibrillation-permanent  Cardiomyopathy ischemic/nonischemic  Orthostatic intolerance-   High Risk Medication Surveillance Dig/AMIO   Ventricular tachycardia -monomorphic-rapid  ICD single chamber-St. Jude   Bradycardia  Renal insufficiency grade 3 (estimated GFR 43)  Treated hypothyroidism  grief  Device function is normal.  Programming changes none, with ventricular pacing 18%, we will discontinue digoxin  see Paceart for details   No interval ventricular tachycardia, but with syncopal VT we will continue his amiodarone at 200 mg a day surveillance laboratories within normal range 9/23 apart from a mildly elevated TSH.  Volume overloaded.  Continue his eplerenone 12.5.  Given furosemide 40 mg to take every other day for 3 doses and then dispensed 30 tablets to take as needed  Orthostatic vital signs remain unimpressive, raising concern as to whether this is neurological unsteadiness; the context of amiodarone therapy needs to be considered.  However, as it remains quasi stable and has had syncopal ventricular tachycardia we will hold off on any adjustments at this time*   Attended to his grieving  pwp

## 2022-08-29 NOTE — Addendum Note (Signed)
Addended by: Alvis Lemmings C on: 08/29/2022 11:30 AM   Modules accepted: Orders

## 2022-08-29 NOTE — Addendum Note (Signed)
Addended byAlvis Lemmings C on: 08/29/2022 11:44 AM   Modules accepted: Orders

## 2022-09-04 ENCOUNTER — Ambulatory Visit: Payer: Medicare Other | Admitting: Cardiology

## 2022-09-07 NOTE — Progress Notes (Signed)
Remote ICD transmission.   

## 2022-09-14 ENCOUNTER — Other Ambulatory Visit: Payer: Self-pay | Admitting: Internal Medicine

## 2022-09-15 ENCOUNTER — Other Ambulatory Visit: Payer: Self-pay | Admitting: Internal Medicine

## 2022-11-21 ENCOUNTER — Ambulatory Visit (INDEPENDENT_AMBULATORY_CARE_PROVIDER_SITE_OTHER): Payer: Medicare Other

## 2022-11-21 DIAGNOSIS — I255 Ischemic cardiomyopathy: Secondary | ICD-10-CM

## 2022-11-22 LAB — CUP PACEART REMOTE DEVICE CHECK
Battery Remaining Longevity: 62 mo
Battery Remaining Percentage: 59 %
Battery Voltage: 2.96 V
Brady Statistic RV Percent Paced: 15 %
Date Time Interrogation Session: 20231226020014
HighPow Impedance: 69 Ohm
HighPow Impedance: 69 Ohm
Implantable Lead Connection Status: 753985
Implantable Lead Implant Date: 20130403
Implantable Lead Location: 753860
Implantable Pulse Generator Implant Date: 20190422
Lead Channel Impedance Value: 380 Ohm
Lead Channel Pacing Threshold Amplitude: 1 V
Lead Channel Pacing Threshold Pulse Width: 0.5 ms
Lead Channel Sensing Intrinsic Amplitude: 11.7 mV
Lead Channel Setting Pacing Amplitude: 2.5 V
Lead Channel Setting Pacing Pulse Width: 0.5 ms
Lead Channel Setting Sensing Sensitivity: 0.5 mV
Pulse Gen Serial Number: 9811176
Zone Setting Status: 755011

## 2022-11-28 NOTE — Progress Notes (Deleted)
Cardiology Clinic Note   Patient Name: Andres WOO Sr. Date of Encounter: 11/28/2022  Primary Care Provider:  [name], MD Primary Cardiologist:  Kathlyn Sacramento, MD  Patient Profile    82 year old male with a past medical history of chronic HFrEF, chronic persistent atrial fibrillation, coronary artery disease, recurrent ventricular tachycardia, ICD followed by EP hyperlipidemia, insulin-dependent type 2 diabetes, and sleep apnea, who is here today for follow-up of his HFrEF and coronary artery disease.  Past Medical History    Past Medical History:  Diagnosis Date   Actinic keratosis    Arthritis    Atrial fibrillation, persistent-long-term    Chronic systolic congestive heart failure ([place])    a. 03/2015 Echo: EF 30-35%, diff HK, mild MR, mod dil LA, mildly dil RA, mild TR.   Coronary artery disease    a. 1994 s/p CABG x 1 (VG->OM3);  b. 10/2015 Cath: LM nl, LAD min irregs, D1 nl, D2 min irregs, LCX 66m OM2 nl, OM3 90 (small territory), VG->OM3 100, RCA min irregs, RPDA/RPL1/RPL2/RPL3 nl, EF 25%-->Med Rx.   Dysrhythmia    Hyperlipidemia    ICD (implantable cardiac defibrillator) in place    a. 02/2012 s/p SJM [id number] Fortify Assurance single lead AICD.   Mixed Ischemic and Non-ischemic Cardiomyopathy    a. 07/2012 s/p SJM AICD (RV lead only);  b. 03/2015 Echo: EF 30-35%, diff HK;  c. EF 25% by LV gram.   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 07/2012 s/ SJM AICD.   Past Surgical History:  Procedure Laterality Date   basel cell removal     left arm   CARDIAC CATHETERIZATION  02/28/2012   Minor disease in LAD and RCA, LCX: 70% mid and occluded distally. Patent SVG to OM3   CARDIAC CATHETERIZATION N/A 11/08/2015   Procedure: Left  Heart Cath and Cors/Grafts Angiography;  Surgeon: MWellington Hampshire MD;  [facility];  Service: Cardiovascular;  Laterality: N/A;   CARDIOVERSION  03/01/2012   Procedure: CARDIOVERSION;  Surgeon: SDeboraha Sprang MD;  [facility];  Service: Cardiovascular;  Laterality: N/A;   CATARACT EXTRACTION W/ INTRAOCULAR LENS  IMPLANT, BILATERAL  2011   CORONARY ARTERY BYPASS GRAFT  1994   [place]; CABG X1   ELECTROPHYSIOLOGY STUDY N/A 02/28/2012   Procedure: ELECTROPHYSIOLOGY STUDY;  Surgeon: SDeboraha Sprang MD;  [facility];  Service: Cardiovascular;  Laterality: N/A;   ICD GENERATOR CHANGEOUT N/A 03/18/2018   [place]  Surgeon: TEvans Lance MD;  [facility];  Service: Cardiovascular;  Laterality: N/A;   INSERT / REPLACE / REMOVE PACEMAKER  02/28/12   "w/defibrillator"   JOINT REPLACEMENT     KNEE ARTHROSCOPY  2012   right   LEFT HEART CATH AND CORONARY ANGIOGRAPHY N/A 01/10/2021   Procedure: LEFT HEART CATH AND CORONARY ANGIOGRAPHY;  Surgeon: AWellington Hampshire MD;  [facility];  Service: Cardiovascular;  Laterality: N/A;   LEFT HEART CATHETERIZATION WITH CORONARY/GRAFT ANGIOGRAM N/A 02/28/2012   Procedure: LEFT HEART CATHETERIZATION WITH CBeatrix Fetters  Surgeon: MWellington Hampshire MD;  [facility];  Service: Cardiovascular;  Laterality:  N/A;   TOTAL KNEE ARTHROPLASTY  2011   left    Allergies  No Known Allergies  History of Present Illness    Andres Mellinger Sr. is an 82 year old male with a previously mentioned complex past medical history of chronic HFrEF, chronic persistent atrial fibrillation, coronary artery disease status post CABG x 1 vessel in 1994 after failed angioplasty on the left circumflex complicated by dissection, recurrent ventricular tachycardia that required ICD placement, hyperlipidemia, insulin-dependent type 2 diabetes, and sleep apnea.  He previously had one-vessel  CABG in 1994 after failed angioplasty to the left circumflex complicated by dissection.  Repeat heart catheterization in 10/2015 showed occluded SVG to OM 3.  The mid left circumflex and diffuse 90% disease into a small OM 3 which was also diffusely diseased and relatively small in size.  EF was 50% with mildly elevated LVEDP.  Previous episodes of ventricular tachycardia with syncope that was successfully treated with ICD shock and subsequent adjustment of his device to deliver ATP.  Generator replacement was completed in April 2019.  Most recent echocardiogram July 2021 showed an EF of 20-25% with moderate tricuspid regurgitation.  Unfortunately he was hospitalized in June 2021 with syncope and was found to have ventricular tachycardia treated with ATP.  CT scan showed subdural hematoma with no which necessitated pausing anticoagulation of rivaroxaban at that time.  He was transition from [place] from his ACE inhibitor.  In September 2021 unfortunately he had episodes of atrial fibrillation with RVR that required the addition of digoxin to his medication regimen his Toprol was increased to 25 mg twice daily.  He again had recurrent ventricular tachycardia in early 2022 and underwent repeat heart catheterization in February 2022 which showed significant underlying one-vessel coronary artery disease affecting the mid to distal left circumflex into OM 3 with chronically occluded SVG to OM.  Stable moderate mid LAD disease.  No significant changes and he was treated with amiodarone.  Later on metoprolol had to be discontinued due to bradycardia.  He was escalated on GDMT and was placed on Jardiance but he had dizziness and his furosemide was discontinued symptoms did not improve and Jardiance was later discontinued.  He was last seen in clinic 09/25/2022 by Dr. Caryl Comes.  Unfortunately at that time he did have dyspnea on exertion that was slowly improving as he had recently been diagnosed with COVID-19 infection and  was treated with antiviral Paxlovid at the time he was extremely short of breath and hypoxic.  His device was interrogated function was normal there were no programming changes that was made with ventricular pacing at 18% and digoxin was discontinued.  He returns to clinic today  Home Medications    Current Outpatient Medications  Medication Sig Dispense Refill   albuterol (VENTOLIN HFA) 108 (90 Base) MCG/ACT inhaler Inhale 2 puffs into the lungs every 6 (six) hours as needed for wheezing or shortness of breath. 8 g 2   amiodarone (PACERONE) 200 MG tablet TAKE 1 TABLET BY MOUTH DAILY 90 tablet 3   benzonatate (TESSALON PERLES) 100 MG capsule Take 2 capsules (200 mg total) by mouth 3 (three) times daily as needed for cough. (Patient not taking: Reported on 08/29/2022) 30 capsule 0   Cinnamon 500 MG TABS Take 1,000 mg by mouth 2 (two) times daily.      Coenzyme Q10 (CO Q 10 PO) Take 300 mg by mouth daily.     ENTRESTO 24-26 MG TAKE ONE (1) TABLET BY MOUTH TWO TIMES PER DAY 180  tablet 1   eplerenone (INSPRA) 25 MG tablet TAKE ONE-HALF TABLET BY MOUTH  DAILY 45 tablet 3   fluticasone (FLONASE) 50 MCG/ACT nasal spray Place 1 spray into the nose as needed for allergies or rhinitis.     folic acid (FOLVITE) 1 MG tablet Take 1 mg by mouth daily.      furosemide (LASIX) 40 MG tablet Take 1 tablet (40 mg) by mouth EVERY OTHER day as directed 30 tablet 0   Garlic 1497 MG CAPS Take 1 capsule by mouth daily.      Glucosamine-Chondroit-Vit C-Mn (GLUCOSAMINE CHONDR 1500 COMPLX PO) Take 1,500 mg by mouth 2 (two) times daily.      insulin NPH Human (HUMULIN N,NOVOLIN N) 100 UNIT/ML injection Inject 85 Units into the skin at bedtime.     insulin regular (NOVOLIN R,HUMULIN R) 100 units/mL injection Inject 20 Units into the skin in the morning.     levocetirizine (XYZAL) 5 MG tablet Take 5 mg by mouth every evening.     levothyroxine (SYNTHROID) 75 MCG tablet Take 75 mcg by mouth daily.     meclizine (ANTIVERT)  25 MG tablet Take 25 mg by mouth 3 (three) times daily as needed for dizziness.     metFORMIN (GLUCOPHAGE) 500 MG tablet Take 500 mg tablet in the am with 0.5 tablet in the pm.     metoprolol succinate (TOPROL-XL) 25 MG 24 hr tablet TAKE 1 TABLET BY MOUTH AT  BEDTIME 90 tablet 3   mometasone (ELOCON) 0.1 % cream Apply 1 application topically daily as needed (Rash). (Patient not taking: Reported on 08/29/2022) 45 g 0   Omega-3 Fatty Acids (FISH OIL PO) Take 1 tablet by mouth 2 (two) times daily.     rosuvastatin (CRESTOR) 10 MG tablet Take 10 mg by mouth daily.     XARELTO 15 MG TABS tablet TAKE 1 TABLET BY MOUTH ONCE DAILY WITH A MEAL 90 tablet 3   No current facility-administered medications for this visit.     Family History    Family History  Problem Relation Age of Onset   Heart attack Father    Heart attack Brother    He indicated that his mother is deceased. He indicated that his father is deceased. He indicated that the status of his brother is unknown.  Social History    Social History   Socioeconomic History   Marital status: Married    Spouse name: Not on file   Number of children: 3   Years of education: Not on file   Highest education level: Not on file  Occupational History   Not on file  Tobacco Use   Smoking status: Former    Packs/day: 1.00    Years: 25.00    Total pack years: 25.00    Types: Cigarettes    Quit date: 01/09/1988    Years since quitting: 34.9   Smokeless tobacco: Never  Vaping Use   Vaping Use: Never used  Substance and Sexual Activity   Alcohol use: No   Drug use: No   Sexual activity: Never  Other Topics Concern   Not on file  Social History Narrative   Not on file   Social Determinants of Health   Financial Resource Strain: Not on file  Food Insecurity: Not on file  Transportation Needs: Not on file  Physical Activity: Not on file  Stress: Not on file  Social Connections: Not on file  Intimate Partner Violence: Not on file  Review of Systems    General:  No chills, fever, night sweats or weight changes.  Cardiovascular:  No chest pain, dyspnea on exertion, edema, orthopnea, palpitations, paroxysmal nocturnal dyspnea. Dermatological: No rash, lesions/masses Respiratory: No cough, dyspnea Urologic: No hematuria, dysuria Abdominal:   No nausea, vomiting, diarrhea, bright red blood per rectum, melena, or hematemesis Neurologic:  No visual changes, wkns, changes in mental status. All other systems reviewed and are otherwise negative except as noted above.     Physical Exam    VS:  There were no vitals taken for this visit. , BMI There is no height or weight on file to calculate BMI.     GEN: Well nourished, well developed, in no acute distress. HEENT: normal. Neck: Supple, no JVD, carotid bruits, or masses. Cardiac: RRR, no murmurs, rubs, or gallops. No clubbing, cyanosis, edema.  Radials 2+/PT 2+ and equal bilaterally.  Respiratory:  Respirations regular and unlabored, clear to auscultation bilaterally. GI: Soft, nontender, nondistended, BS + x 4. MS: no deformity or atrophy. Skin: warm and dry, no rash. Neuro:  Strength and sensation are intact. Psych: Normal affect.  Accessory Clinical Findings    ECG personally reviewed by me today- *** - No acute changes  Lab Results  Component Value Date   WBC 7.3 08/20/2022   HGB 13.6 08/20/2022   HCT 42.5 08/20/2022   MCV 101.4 (H) 08/20/2022   PLT 149 (L) 08/20/2022   Lab Results  Component Value Date   CREATININE 1.64 (H) 08/20/2022   BUN 22 08/20/2022   NA 138 08/20/2022   K 5.1 08/20/2022   CL 107 08/20/2022   CO2 23 08/20/2022   Lab Results  Component Value Date   ALT 20 08/20/2022   AST 27 08/20/2022   ALKPHOS 60 08/20/2022   BILITOT 1.0 08/20/2022   No results found for: "CHOL", "HDL", "LDLCALC", "LDLDIRECT", "TRIG", "CHOLHDL"  Lab Results  Component Value Date   HGBA1C 6.8 (H) 05/26/2020    Assessment & Plan   1.   ***  Jeanpierre Thebeau, NP 11/28/2022, 8:05 PM

## 2022-11-29 ENCOUNTER — Ambulatory Visit: Payer: Medicare Other | Attending: Cardiology | Admitting: Cardiology

## 2022-11-30 ENCOUNTER — Encounter: Payer: Self-pay | Admitting: Cardiology

## 2022-12-16 ENCOUNTER — Other Ambulatory Visit: Payer: Self-pay | Admitting: Internal Medicine

## 2022-12-18 NOTE — Telephone Encounter (Signed)
LMOV

## 2022-12-18 NOTE — Progress Notes (Signed)
Remote ICD transmission.   

## 2022-12-18 NOTE — Telephone Encounter (Signed)
Please reschedule 3 month F/U appt. Thank you!

## 2023-01-03 NOTE — Progress Notes (Signed)
Cardiology Office Note    Date:  01/05/2023   ID:  Andres Gandy Sr., DOB 08-03-1941, MRN [id number]  PCP:  [name], MD  Cardiologist:  Kathlyn Sacramento, MD  Electrophysiologist:  Virl Axe, MD   Chief Complaint: Follow-up  History of Present Illness:   Andres Albers. is a 82 y.o. 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  Andres [name] underwent EP study in 02/2012 which showed inducible VT. Subsequently he underwent implantation of SJM single lead ICD.  LHC at that time showed a patent SVG to OM3.  LHC in 10/2015 showed an occluded SVG to OM3.  The mid LCx had diffuse 90% disease into a small OM3, which was diffusely diseased and relatively small in size.  EF was 15% with mildly elevated LVEDP.  Medical therapy was recommended.  Echo from 05/2020 showed an EF of 20 to 25% with moderate tricuspid regurgitation.  He was admitted in 04/2020 with syncope and was found to have ventricular tachycardia treated with ATP.  CT of the head showed a subdural hematoma which necessitated holding of rivaroxaban at that time.  During that time frame, he was transitioned from ACE inhibitor to [place].  In 07/2020, in the setting of A-fib with RVR, digoxin was added and Toprol was titrated.  He had recurrent ventricular tachycardia in early 2022 with repeat LHC in 12/2020 showing significant underlying one-vessel CAD affecting the mid to distal LCx into OM3 with chronically occluded SVG to OM3 and stable moderate  mid LAD disease.  Overall, there was no significant change from cardiac cath in 2016.  He was treated with amiodarone.  Metoprolol has previously been discontinued due to bradycardia.  Setting of dizziness, Jardiance and furosemide have also previously been held.  He was seen by general cardiology in 01/2022 and was under significant stress surrounding his wife's health.  He reported stable exertional dyspnea without chest pain.  Despite being off furosemide, he did not have significant lower extremity swelling and his weight was stable.  He was last seen by EP in 08/2022 and remained without symptoms of angina or cardiac decompensation.  Digoxin was discontinued.  There was no interval ventricular tachycardia noted on device interrogation with recommendation to continue amiodarone from an EP perspective.  He was felt to be volume overloaded with recommendation to take furosemide 40 mg every other day for 3 days then as needed.  He did continue to note stable dizziness.  He comes in doing well from a cardiac perspective and is without symptoms of angina or decompensation.  He notes chronic stable dizziness.  No presyncope or syncope.  No dyspnea or palpitations.  He reports he is largely sedentary following the passing of his wife.  Previously played golf on a regular basis.  Is considering getting back into this.  He does walk up and down his driveway a couple of times per week.  No lower extremity swelling, abdominal distention, or orthopnea.  No early satiety.  Since we last saw him, PCP recommended the patient continue Lasix 20  mg daily indefinitely for volume overload and outside chest x-ray showing mild pulmonary edema.  Follow-up labs with this time showed stable renal function.  He reports a tingling sensation at his ICD if leaning forward or shaving.  No alarms or shocks.   Labs independently reviewed: 12/2022 - A1c 7.2, potassium 5.0, BUN 31, serum creatinine 1.8 09/2022 - potassium 5.0, BUN 28,  serum creatinine 1.8, albumin 4.0, AST/ALT normal, Hgb 13.2, PLT 210, TC 157, TG 80, HDL 44, LDL 97, A1c 7.8, TSH normal 07/2022 - potassium 5.1, BUN 22, serum creatinine 1.64  Past Medical History:  Diagnosis Date   Actinic keratosis    Arthritis    Atrial fibrillation, persistent-long-term    Chronic systolic congestive heart failure ([place])    a. 03/2015 Echo: EF 30-35%, diff HK, mild MR, mod dil LA, mildly dil RA, mild TR.   Coronary artery disease    a. 1994 s/p CABG x 1 (VG->OM3);  b. 10/2015 Cath: LM nl, LAD min irregs, D1 nl, D2 min irregs, LCX 10m OM2 nl, OM3 90 (small territory), VG->OM3 100, RCA min irregs, RPDA/RPL1/RPL2/RPL3 nl, EF 25%-->Med Rx.   Dysrhythmia    Hyperlipidemia    ICD (implantable cardiac defibrillator) in place    a. 02/2012 s/p SJM [id number] Fort[employer] Assurance single lead AICD.   Mixed Ischemic and Non-ischemic Cardiomyopathy    a. 07/2012 s/p SJM AICD (RV lead only);  b. 03/2015 Echo: EF 30-35%, diff HK;  c. EF 25% by LV gram.   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 07/2012 s/ SJM AICD.    Past Surgical History:  Procedure Laterality Date   basel cell removal     left arm   CARDIAC CATHETERIZATION  02/28/2012   Minor disease in LAD and RCA, LCX: 70% mid and occluded distally. Patent SVG to OM3   CARDIAC CATHETERIZATION N/A 11/08/2015   Procedure: Left Heart Cath and Cors/Grafts Angiography;  Surgeon: MWellington Hampshire MD;  [facility];  Service: Cardiovascular;  Laterality: N/A;   CARDIOVERSION  03/01/2012   Procedure: CARDIOVERSION;  Surgeon: SDeboraha Sprang MD;  [facility];  Service: Cardiovascular;  Laterality: N/A;   CATARACT EXTRACTION W/ INTRAOCULAR LENS  IMPLANT, BILATERAL  2011   [place]ON[place]  [place]   ELECTROPHYSIOLOGY STUDY N/A 02/28/2012   Procedure: ELECTROPHYSIOLOGY STUDY;  Surgeon: SDeboraha Sprang MD;  [facility];  Service: Cardiovascular;  Laterality: N/A;   ICD GENERATOR CHANGEOUT N/A 03/18/2018   Procedure: IBud  Surgeon: TEvans Lance MD;  [facility];  Service: Cardiovascular;  Laterality: N/A;   INSERT / REPLACE / REMOVE PACEMAKER  02/28/12   "w/defibrillator"   JOINT REPLACEMENT     KNEE ARTHROSCOPY  2012   right   LEFT HEART CATH AND CORONARY ANGIOGRAPHY N/A 01/10/2021   Procedure: LEFT HEART CATH AND CORONARY ANGIOGRAPHY;  Surgeon: AWellington Hampshire MD;  [facility];  Service: Cardiovascular;  Laterality: N/A;   LEFT HEART CATHETERIZATION WITH CORONARY/GRAFT ANGIOGRAM N/A 02/28/2012   Procedure: LEFT HEART CATHETERIZATION WITH CBeatrix Fetters  Surgeon: MWellington Hampshire MD;  Location: [facility] C[facility];  Service: Cardiovascular;  Laterality: N/A;   TOTAL KNEE ARTHROPLASTY  2011   left    Current Medications: Current Meds  Medication Sig   albuterol (VENTOLIN HFA) 108 (90 Base) MCG/ACT inhaler Inhale 2 puffs into the lungs every 6 (six) hours as needed for wheezing or shortness of breath.   amiodarone (PACERONE) 200 MG tablet TAKE 1 TABLET BY MOUTH DAILY   Cinnamon 500 MG TABS Take 1,000 mg by mouth 2 (two) times daily.    Coenzyme Q10 (CO Q 10 PO) Take 300 mg by mouth daily.   Continuous Blood Gluc Sensor (DEXCOM G7 SENSOR) MISC USE 1 SENSOR EVERY 10 DAYS   ENTRESTO 24-26 MG TAKE ONE (1) TABLET BY MOUTH TWO TIMES PER DAY   eplerenone (INSPRA) 25 MG tablet TAKE ONE-HALF TABLET BY MOUTH  DAILY   fluticasone (FLONASE) 50 MCG/ACT nasal spray Place 1 spray into the nose as needed for allergies or rhinitis.   folic acid (FOLVITE) 1 MG tablet Take 1 mg by mouth daily.    furosemide (LASIX) 20 MG tablet Take 20 mg by mouth daily. Added by PCP.   Garlic 123XX123 MG CAPS Take 1  capsule by mouth daily.    Glucosamine-Chondroit-Vit C-Mn (GLUCOSAMINE CHONDR 1500 COMPLX PO) Take 1,500 mg by mouth 2 (two) times daily.    insulin NPH Human (HUMULIN N,NOVOLIN N) 100 UNIT/ML injection Inject 85 Units into the skin at bedtime.   insulin regular (NOVOLIN R,HUMULIN R) 100 units/mL injection Inject 20 Units into the skin in the morning.   levocetirizine (XYZAL) 5 MG tablet Take 5 mg by mouth every evening.   levothyroxine (SYNTHROID) 75 MCG tablet Take 75 mcg by mouth daily.   meclizine (ANTIVERT) 25 MG tablet Take 25 mg by mouth 3 (three) times daily as needed for dizziness.   metFORMIN (GLUCOPHAGE) 500 MG tablet Take 500 mg tablet in the am with 0.5 tablet in the pm.   metoprolol succinate (TOPROL-XL) 25 MG 24 hr tablet TAKE 1 TABLET BY MOUTH AT  BEDTIME   Omega-3 Fatty Acids (FISH OIL PO) Take 1 tablet by mouth 2 (two) times daily.   rosuvastatin (CRESTOR) 10 MG tablet Take 10 mg by mouth daily.   traZODone (DESYREL) 50 MG tablet Take 1 tablet by mouth at bedtime.   XARELTO 15 MG TABS tablet TAKE 1 TABLET BY MOUTH ONCE DAILY WITH A MEAL   [DISCONTINUED] furosemide (LASIX) 40 MG tablet Take 1 tablet (40 mg) by mouth EVERY OTHER day as directed    Allergies:   Patient has no known allergies.   Social History   Socioeconomic History   Marital status: Married    Spouse name: Not on file   Number of children: 3   Years of education: Not on file   Highest education level: Not on file  Occupational History   Not on file  Tobacco Use   Smoking status: Former    Packs/day: 1.00    Years: 25.00    Total pack years: 25.00    Types: Cigarettes    Quit date: 01/09/1988    Years since quitting: 35.0   Smokeless tobacco: Never  Vaping Use   Vaping Use: Never used  Substance and Sexual Activity   Alcohol use: No   Drug use: No   Sexual activity: Never  Other Topics Concern   Not on file  Social History Narrative   Not on file   Social Determinants of Health  Financial Resource Strain: Not on file  Food Insecurity: Not on file  Transportation Needs: Not on file  Physical Activity: Not on file  Stress: Not on file  Social Connections: Not on file     Family History:  The patient's family history includes Heart attack in his brother and father.  ROS:   12-point review of systems is negative unless otherwise noted in the HPI.   EKGs/Labs/Other Studies Reviewed:    Studies reviewed were summarized above. The additional studies were reviewed today:  2D echo 02/22/2021: 1. Left ventricular ejection fraction, by estimation, is 25 to 30%. The  left ventricle has severely decreased function. The left ventricle  demonstrates global hypokinesis. The left ventricular internal cavity size  was mildly dilated. Left ventricular  diastolic parameters are indeterminate.   2. Right ventricular systolic function is normal. The right ventricular  size is normal. There is mildly elevated pulmonary artery systolic  pressure.   3. Left atrial size was moderately dilated.   4. Right atrial size was moderately dilated.   5. The mitral valve is normal in structure. Mild mitral valve  regurgitation. No evidence of mitral stenosis.   6. Tricuspid valve regurgitation is mild to moderate.   7. The aortic valve is normal in structure. Aortic valve regurgitation is  not visualized. Mild to moderate aortic valve sclerosis/calcification is  present, without any evidence of aortic stenosis.   8. The inferior vena cava is normal in size with greater than 50%  respiratory variability, suggesting right atrial pressure of 3 mmHg.  __________  LHC 01/10/2021: Ost 3rd Mrg to 3rd Mrg lesion is 90% stenosed. Mid LAD lesion is 50% stenosed. Mid Cx to Dist Cx lesion is 90% stenosed. 3rd Mrg-1 lesion is 80% stenosed. 3rd Mrg-2 lesion is 100% stenosed. Ost Cx to Prox Cx lesion is 30% stenosed. SVG. Origin to Prox Graft lesion is 100% stenosed.   1.  Significant  underlying one-vessel coronary artery disease affecting the mid to distal left circumflex into OM 3 with chronically occluded SVG to OM 3.  Stable moderate mid LAD disease.  Overall, no significant change from most recent cardiac catheterization in 2016. 2.  Normal left ventricular end-diastolic pressure at 11 to 12 mmHg.  Left ventricular angiography was not performed due to chronic kidney disease.   Recommendations: The distal left circumflex disease is chronic and supplies a small territory mainly OM 3 distribution which is occluded distally and diffusely diseased proximally.  PCI is not recommended.  Overall appearance of the coronary arteries is not different from most recent catheterization. Recommend continuing medical therapy. __________  2D echo 05/27/2020: 1. Left ventricular ejection fraction, by estimation, is 20 to 25%. The  left ventricle has severely decreased function. The left ventricle  demonstrates global hypokinesis. The left ventricular internal cavity size  was mildly dilated. Left ventricular  diastolic parameters are indeterminate.   2. Right ventricular systolic function is mildly reduced. The right  ventricular size is mildly enlarged. There is mildly elevated pulmonary  artery systolic pressure. The estimated right ventricular systolic  pressure is 0000000 mmHg.   3. Tricuspid valve regurgitation is moderate.  __________  2D echo 07/24/2019: 1. The left ventricle has moderate-severely reduced systolic function,  with an ejection fraction of 30-35%. The cavity size was moderately  dilated. Left ventricular diastolic Doppler parameters are indeterminate.  Left ventricular diffuse hypokinesis.   2. The right ventricle has low normal systolic function. The cavity was  mildly enlarged. There is no  increase in right ventricular wall thickness.  Right ventricular systolic pressure is mildly elevated with an estimated  pressure of 35.8 mmHg.   3. Left atrial size was  moderately dilated   4. Mild dilation of the ascending aorta 3.5 cm  __________  LHC 11/08/2015: Mid Cx lesion, 90% stenosed. Ost 3rd Mrg to 3rd Mrg lesion, 90% stenosed. SVG was injected . Origin lesion, 100% stenosed. There is severe left ventricular systolic dysfunction.   1. Significant one-vessel coronary artery disease involving distal left circumflex into OM 3. No obstructive disease involving the LAD/RCA. Occluded SVG to OM 3 which is new since most recent cardiac catheterization in 2013. 2. Severely reduced LV systolic function with an ejection fraction of 15% with global hypokinesis. 3. Mildly elevated left ventricular end-diastolic pressure.   Recommendations: Continue aggressive medical therapy for coronary artery disease and heart failure. Cardiomyopathy is out of proportion to coronary artery disease.  I doubt there would be much benefit from revascularization of the distal left circumflex and OM 3 given that it supplies a relatively small territory and the area is diffusely diseased. __________  Carlton Adam MPI 03/31/2015: This is a high risk study - due to large size infarction and low ejection fraction. NO ischemia identified Findings consistent with prior myocardial infarction and prior myocardial infarction with peri-infarct ischemia. The left ventricular ejection fraction is severely decreased (<30%). Ischemic Cardiomyopathy Defect 1: There is a large defect of severe severity present in the basal inferior, basal inferolateral, mid inferior, mid inferolateral, apical inferior, apical lateral and apex location. __________  2D echo 03/30/2015: - Left ventricle: The cavity size was severely dilated. Wall    thickness was normal. Systolic function was moderately to    severely reduced. The estimated ejection fraction was in the    range of 30% to 35%. Diffuse hypokinesis. The study is not    technically sufficient to allow evaluation of LV diastolic    function.  - Mitral  valve: There was mild regurgitation.  - Left atrium: The atrium was moderately dilated.  - Right atrium: The atrium was mildly dilated.  - Tricuspid valve: There was mild regurgitation.  - Pulmonary arteries: Systolic pressure was within the normal    range.     EKG:  EKG is ordered today.  The EKG ordered today demonstrates A-fib, 61 bpm, right axis deviation, nonspecific IVCD, nonspecific ST-T changes, consistent with prior tracings  Recent Labs: 08/20/2022: ALT 20; B Natriuretic Peptide 922.2; BUN 22; Creatinine, Ser 1.64; Hemoglobin 13.6; Platelets 149; Potassium 5.1; Sodium 138  Recent Lipid Panel No results found for: "CHOL", "TRIG", "HDL", "CHOLHDL", "VLDL", "LDLCALC", "LDLDIRECT"  PHYSICAL EXAM:    VS:  BP 128/68 (BP Location: Left Arm, Patient Position: Sitting, Cuff Size: Normal)   Pulse 61   Ht 5' 10"$  (1.778 m)   Wt 215 lb 6.4 oz (97.7 kg)   SpO2 94%   BMI 30.91 kg/m   BMI: Body mass index is 30.91 kg/m.  Physical Exam Vitals reviewed.  Constitutional:      Appearance: He is well-developed.  HENT:     Head: Normocephalic and atraumatic.  Eyes:     General:        Right eye: No discharge.        Left eye: No discharge.  Neck:     Vascular: No JVD.  Cardiovascular:     Rate and Rhythm: Normal rate and regular rhythm.     Pulses:  Posterior tibial pulses are 2+ on the right side and 2+ on the left side.     Heart sounds: Normal heart sounds, S1 normal and S2 normal. Heart sounds not distant. No midsystolic click and no opening snap. No murmur heard.    No friction rub.     Comments: No erythema, swelling, bleeding, bruising, warmth, or tenderness to palpation of the pacemaker incision site. Pulmonary:     Effort: Pulmonary effort is normal. No respiratory distress.     Breath sounds: Normal breath sounds. No decreased breath sounds, wheezing or rales.  Chest:     Chest wall: No tenderness.  Abdominal:     General: There is no distension.   Musculoskeletal:     Cervical back: Normal range of motion.     Right lower leg: No edema.     Left lower leg: No edema.  Skin:    General: Skin is warm and dry.     Nails: There is no clubbing.  Neurological:     Mental Status: He is alert and oriented to person, place, and time.  Psychiatric:        Speech: Speech normal.        Behavior: Behavior normal.        Thought Content: Thought content normal.        Judgment: Judgment normal.     Wt Readings from Last 3 Encounters:  01/05/23 215 lb 6.4 oz (97.7 kg)  08/29/22 213 lb (96.6 kg)  08/20/22 213 lb (96.6 kg)     ASSESSMENT & PLAN:   CAD status post CABG without angina: He is doing well and is without symptoms concerning for angina or decompensation.  His CAD mainly affects the LCx which supplies a small territory.  Continue medical therapy including rivaroxaban in place of aspirin given underlying A-fib, in an effort to minimize bleeding risk along with Toprol-XL and rosuvastatin.  No indication for further ischemic testing at this time.  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  He remains on Entresto, metoprolol succinate, eplerenone, and low-dose furosemide.  Recent renal function and electrolytes stable.  Not on SGLT2 inhibitor secondary to dizziness.  Permanent A-fib: Ventricular rate well-controlled on Toprol-XL.  CHA2DS2-VASc at least 5 (CHF, age x 2, DM, vascular disease).  He remains on renally dosed rivaroxaban 15 mg with a creatinine clearance of 43.5.  Recurrent ventricular tachycardia: No evidence of recurrence.  He remains on Toprol-XL and amiodarone.  Recent AST/ALT and TSH normal.  No device discharges or alarms.  Follow-up with EP as directed.  HLD: LDL 97 in 09/2022 with normal AST/ALT at that time.  Goal LDL less than 70.  He is not able to tolerate higher doses of statins, but is able to take a low-dose rosuvastatin 10 mg daily.  CKD stage IIIb: Stable on checked just  yesterday.  Recommend close monitoring given eplerenone to use.  Followed by PCP.  Avoid nephrotoxic agents.   Disposition: F/u with [name] or an APP in 6 months, and EP as directed.   Medication Adjustments/Labs and Tests Ordered: Current medicines are reviewed at length with the patient today.  Concerns regarding medicines are outlined above. Medication changes, Labs and Tests ordered today are summarized above and listed in the Patient Instructions accessible in Encounters.   SignedChristell Faith, PA-C 01/05/2023 11:07 AM     [address], [place] [address] [phone]

## 2023-01-05 ENCOUNTER — Encounter: Payer: Self-pay | Admitting: Physician Assistant

## 2023-01-05 ENCOUNTER — Ambulatory Visit: Payer: Medicare Other | Attending: Cardiology | Admitting: Physician Assistant

## 2023-01-05 VITALS — BP 128/68 | HR 61 | Ht 70.0 in | Wt 215.4 lb

## 2023-01-05 DIAGNOSIS — I2510 Atherosclerotic heart disease of native coronary artery without angina pectoris: Secondary | ICD-10-CM

## 2023-01-05 DIAGNOSIS — I4821 Permanent atrial fibrillation: Secondary | ICD-10-CM | POA: Diagnosis not present

## 2023-01-05 DIAGNOSIS — Z951 Presence of aortocoronary bypass graft: Secondary | ICD-10-CM | POA: Diagnosis not present

## 2023-01-05 DIAGNOSIS — I5022 Chronic systolic (congestive) heart failure: Secondary | ICD-10-CM | POA: Diagnosis not present

## 2023-01-05 DIAGNOSIS — I255 Ischemic cardiomyopathy: Secondary | ICD-10-CM

## 2023-01-05 DIAGNOSIS — Z95810 Presence of automatic (implantable) cardiac defibrillator: Secondary | ICD-10-CM | POA: Diagnosis not present

## 2023-01-05 DIAGNOSIS — I472 Ventricular tachycardia, unspecified: Secondary | ICD-10-CM

## 2023-01-05 DIAGNOSIS — E785 Hyperlipidemia, unspecified: Secondary | ICD-10-CM

## 2023-01-05 DIAGNOSIS — N1832 Chronic kidney disease, stage 3b: Secondary | ICD-10-CM

## 2023-01-05 NOTE — Patient Instructions (Signed)
Medication Instructions:  No changes at this time.   *If you need a refill on your cardiac medications before your next appointment, please call your pharmacy*   Lab Work: None  If you have labs (blood work) drawn today and your tests are completely normal, you will receive your results only by: [facility] (if you have MyChart) OR A paper copy in the mail If you have any lab test that is abnormal or we need to change your treatment, we will call you to review the results.   Testing/Procedures: None   Follow-Up: At [facility], you and your health needs are our priority.  As part of our continuing mission to provide you with exceptional heart care, we have created designated Provider Care Teams.  These Care Teams include your primary Cardiologist (physician) and Advanced Practice Providers (APPs -  Physician Assistants and Nurse Practitioners) who all work together to provide you with the care you need, when you need it.  We recommend signing up for the patient portal called "MyChart".  Sign up information is provided on this After Visit Summary.  MyChart is used to connect with patients for Virtual Visits (Telemedicine).  Patients are able to view lab/test results, encounter notes, upcoming appointments, etc.  Non-urgent messages can be sent to your provider as well.   To learn more about what you can do with MyChart, go to [email or web address].    Your next appointment:   6 month(s)  Provider:   Kathlyn Sacramento, MD or Christell Faith, PA-C

## 2023-01-10 ENCOUNTER — Ambulatory Visit: Payer: Medicare Other | Admitting: Dermatology

## 2023-01-24 ENCOUNTER — Ambulatory Visit: Payer: Medicare Other | Admitting: Dermatology

## 2023-01-24 VITALS — BP 110/63 | HR 66

## 2023-01-24 DIAGNOSIS — Z1283 Encounter for screening for malignant neoplasm of skin: Secondary | ICD-10-CM | POA: Diagnosis not present

## 2023-01-24 DIAGNOSIS — L578 Other skin changes due to chronic exposure to nonionizing radiation: Secondary | ICD-10-CM

## 2023-01-24 DIAGNOSIS — L814 Other melanin hyperpigmentation: Secondary | ICD-10-CM

## 2023-01-24 DIAGNOSIS — I781 Nevus, non-neoplastic: Secondary | ICD-10-CM

## 2023-01-24 DIAGNOSIS — Z86007 Personal history of in-situ neoplasm of skin: Secondary | ICD-10-CM

## 2023-01-24 DIAGNOSIS — I872 Venous insufficiency (chronic) (peripheral): Secondary | ICD-10-CM

## 2023-01-24 DIAGNOSIS — L918 Other hypertrophic disorders of the skin: Secondary | ICD-10-CM

## 2023-01-24 DIAGNOSIS — D229 Melanocytic nevi, unspecified: Secondary | ICD-10-CM

## 2023-01-24 DIAGNOSIS — L570 Actinic keratosis: Secondary | ICD-10-CM | POA: Diagnosis not present

## 2023-01-24 DIAGNOSIS — Z7189 Other specified counseling: Secondary | ICD-10-CM

## 2023-01-24 DIAGNOSIS — Z8589 Personal history of malignant neoplasm of other organs and systems: Secondary | ICD-10-CM

## 2023-01-24 DIAGNOSIS — L821 Other seborrheic keratosis: Secondary | ICD-10-CM

## 2023-01-24 NOTE — Progress Notes (Signed)
Follow-Up Visit   Subjective  Andres VALADE Sr. is a 82 y.o. male who presents for the following: Total body skin exam (Hx of SCC, AKs). The patient presents for Total-Body Skin Exam (TBSE) for skin cancer screening and mole check.  The patient has spots, moles and lesions to be evaluated, some may be new or changing and the patient has concerns that these could be cancer.   The following portions of the chart were reviewed this encounter and updated as appropriate:   Tobacco  Allergies  Meds  Problems  Med Hx  Surg Hx  Fam Hx     Review of Systems:  No other skin or systemic complaints except as noted in HPI or Assessment and Plan.  Objective  Well appearing patient in no apparent distress; mood and affect are within normal limits.  A full examination was performed including scalp, head, eyes, ears, nose, lips, neck, chest, axillae, abdomen, back, buttocks, bilateral upper extremities, bilateral lower extremities, hands, feet, fingers, toes, fingernails, and toenails. All findings within normal limits unless otherwise noted below.  Nose, L cheek Dilated vessels nose, L cheek  Right Lower Leg - Anterior Stasis changes lower legs  face x 7 (7) Pink scaly macules   Assessment & Plan  Telangiectasia Nose, L cheek Benign, observe Counseling for BBL / IPL / Laser and Coordination of Care Discussed the treatment option of Broad Band Light (BBL) Daron Offer Pulsed Light (IPL)/ Laser for skin discoloration, including brown spots and redness.  Typically we recommend at least 1-3 treatment sessions about 5-8 weeks apart for best results.  Cannot have tanned skin when BBL performed, and regular use of sunscreen is advised after the procedure to help maintain results. The patient's condition may also require "maintenance treatments" in the future.  The fee for BBL / laser treatments is $350 per treatment session for the whole face.  A fee can be quoted for other parts of the body.   Insurance typically does not pay for BBL/laser treatments and therefore the fee is an out-of-pocket cost.   Stasis dermatitis of both legs Right Lower Leg - Anterior With Schamberg's Purpura Stasis in the legs causes chronic leg swelling, which may result in itchy or painful rashes, skin discoloration, skin texture changes, and sometimes ulceration.  Recommend daily graduated compression hose/stockings- easiest to put on first thing in morning, remove at bedtime.  Elevate legs as much as possible. Avoid salt/sodium rich foods.  AK (actinic keratosis) (7) face x 7 Destruction of lesion - face x 7 Complexity: simple   Destruction method: cryotherapy   Informed consent: discussed and consent obtained   Timeout:  patient name, date of birth, surgical site, and procedure verified Lesion destroyed using liquid nitrogen: Yes   Region frozen until ice ball extended beyond lesion: Yes   Outcome: patient tolerated procedure well with no complications   Post-procedure details: wound care instructions given    History of Squamous Cell Carcinoma in Situ of the Skin - No evidence of recurrence today - Recommend regular full body skin exams - Recommend daily broad spectrum sunscreen SPF 30+ to sun-exposed areas, reapply every 2 hours as needed.  - Call if any new or changing lesions are noted between office visits  - L upper arm  Lentigines - Scattered tan macules - Due to sun exposure - Benign-appearing, observe - Recommend daily broad spectrum sunscreen SPF 30+ to sun-exposed areas, reapply every 2 hours as needed. - Call for any changes - upper  back  Seborrheic Keratoses - Stuck-on, waxy, tan-brown papules and/or plaques  - Benign-appearing - Discussed benign etiology and prognosis. - Observe - Call for any changes - back  Acrochordons (Skin Tags) - Fleshy, skin-colored pedunculated papules - Benign appearing.  - Observe. - If desired, they can be removed with an in office procedure  that is not covered by insurance. - Please call the clinic if you notice any new or changing lesions.   Melanocytic Nevi - Tan-brown and/or pink-flesh-colored symmetric macules and papules - Benign appearing on exam today - Observation - Call clinic for new or changing moles - Recommend daily use of broad spectrum spf 30+ sunscreen to sun-exposed areas.   Hemangiomas - Red papules - Discussed benign nature - Observe - Call for any changes - trunk  Actinic Damage - Chronic condition, secondary to cumulative UV/sun exposure - diffuse scaly erythematous macules with underlying dyspigmentation - Recommend daily broad spectrum sunscreen SPF 30+ to sun-exposed areas, reapply every 2 hours as needed.  - Staying in the shade or wearing long sleeves, sun glasses (UVA+UVB protection) and wide brim hats (4-inch brim around the entire circumference of the hat) are also recommended for sun protection.  - Call for new or changing lesions.  Skin cancer screening performed today.   Return in about 1 year (around 01/25/2024) for TBSE, Hx of SCC, Hx of AKs.  I, Othelia Pulling, RMA, am acting as scribe for Sarina Ser, MD . Documentation: I have reviewed the above documentation for accuracy and completeness, and I agree with the above.  Sarina Ser, MD

## 2023-01-24 NOTE — Patient Instructions (Addendum)
 Cryotherapy Aftercare  Wash gently with soap and water everyday.   Apply Vaseline and Band-Aid daily until healed. Due to recent changes in healthcare laws, you may see results of your pathology and/or laboratory studies on MyChart before the doctors have had a chance to review them. We understand that in some cases there may be results that are confusing or concerning to you. Please understand that not all results are received at the same time and often the doctors may need to interpret multiple results in order to provide you with the best plan of care or course of treatment. Therefore, we ask that you please give us 2 business days to thoroughly review all your results before contacting the office for clarification. Should we see a critical lab result, you will be contacted sooner.   If You Need Anything After Your Visit  If you have any questions or concerns for your doctor, please call our main line at [phone] and press option 4 to reach your doctor's medical assistant. If no one answers, please leave a voicemail as directed and we will return your call as soon as possible. Messages left after 4 pm will be answered the following business day.   You may also send us a message via MyChart. We typically respond to MyChart messages within 1-2 business days.  For prescription refills, please ask your pharmacy to contact our office. Our fax number is [phone].  If you have an urgent issue when the clinic is closed that cannot wait until the next business day, you can page your doctor at the number below.    Please note that while we do our best to be available for urgent issues outside of office hours, we are not available 24/7.   If you have an urgent issue and are unable to reach us, you may choose to seek medical care at your doctor's office, retail clinic, urgent care center, or emergency room.  If you have a medical emergency, please immediately call 911 or go to the emergency  department.  Pager Numbers  - Dr. Kowalski: [phone]  - Dr. Moye: [phone]  - Dr. Stewart: [phone]  In the event of inclement weather, please call our main line at [phone] for an update on the status of any delays or closures.  Dermatology Medication Tips: Please keep the boxes that topical medications come in in order to help keep track of the instructions about where and how to use these. Pharmacies typically print the medication instructions only on the boxes and not directly on the medication tubes.   If your medication is too expensive, please contact our office at [phone] option 4 or send us a message through MyChart.   We are unable to tell what your co-pay for medications will be in advance as this is different depending on your insurance coverage. However, we may be able to find a substitute medication at lower cost or fill out paperwork to get insurance to cover a needed medication.   If a prior authorization is required to get your medication covered by your insurance company, please allow us 1-2 business days to complete this process.  Drug prices often vary depending on where the prescription is filled and some pharmacies may offer cheaper prices.  The website www.goodrx.com contains coupons for medications through different pharmacies. The prices here do not account for what the cost may be with help from insurance (it may be cheaper with your insurance), but the website can give you the   price if you did not use any insurance.  - You can print the associated coupon and take it with your prescription to the pharmacy.  - You may also stop by our office during regular business hours and pick up a GoodRx coupon card.  - If you need your prescription sent electronically to a different pharmacy, notify our office through [facility] MyChart or by phone at [phone] option 4.     Si Usted Necesita Algo Despus de Su Visita  Tambin puede enviarnos un  mensaje a travs de MyChart. Por lo general respondemos a los mensajes de MyChart en el transcurso de 1 a 2 das hbiles.  Para renovar recetas, por favor pida a su farmacia que se ponga en contacto con nuestra oficina. Nuestro nmero de fax es el [phone].  Si tiene un asunto urgente cuando la clnica est cerrada y que no puede esperar hasta el siguiente da hbil, puede llamar/localizar a su doctor(a) al nmero que aparece a continuacin.   Por favor, tenga en cuenta que aunque hacemos todo lo posible para estar disponibles para asuntos urgentes fuera del horario de oficina, no estamos disponibles las 24 horas del da, los 7 das de la semana.   Si tiene un problema urgente y no puede comunicarse con nosotros, puede optar por buscar atencin mdica  en el consultorio de su doctor(a), en una clnica privada, en un centro de atencin urgente o en una sala de emergencias.  Si tiene una emergencia mdica, por favor llame inmediatamente al 911 o vaya a la sala de emergencias.  Nmeros de bper  - Dr. Kowalski: [phone]  - Dra. Moye: [phone]  - Dra. Stewart: [phone]  En caso de inclemencias del tiempo, por favor llame a nuestra lnea principal al [phone] para una actualizacin sobre el estado de cualquier retraso o cierre.  Consejos para la medicacin en dermatologa: Por favor, guarde las cajas en las que vienen los medicamentos de uso tpico para ayudarle a seguir las instrucciones sobre dnde y cmo usarlos. Las farmacias generalmente imprimen las instrucciones del medicamento slo en las cajas y no directamente en los tubos del medicamento.   Si su medicamento es muy caro, por favor, pngase en contacto con nuestra oficina llamando al [phone] y presione la opcin 4 o envenos un mensaje a travs de MyChart.   No podemos decirle cul ser su copago por los medicamentos por adelantado ya que esto es diferente dependiendo de la cobertura de su seguro. Sin embargo,  es posible que podamos encontrar un medicamento sustituto a menor costo o llenar un formulario para que el seguro cubra el medicamento que se considera necesario.   Si se requiere una autorizacin previa para que su compaa de seguros cubra su medicamento, por favor permtanos de 1 a 2 das hbiles para completar este proceso.  Los precios de los medicamentos varan con frecuencia dependiendo del lugar de dnde se surte la receta y alguna farmacias pueden ofrecer precios ms baratos.  El sitio web www.goodrx.com tiene cupones para medicamentos de diferentes farmacias. Los precios aqu no tienen en cuenta lo que podra costar con la ayuda del seguro (puede ser ms barato con su seguro), pero el sitio web puede darle el precio si no utiliz ningn seguro.  - Puede imprimir el cupn correspondiente y llevarlo con su receta a la farmacia.  - Tambin puede pasar por nuestra oficina durante el horario de atencin regular y recoger una tarjeta de cupones de GoodRx.  - Si necesita que   su receta se enve electrnicamente a una farmacia diferente, informe a nuestra oficina a travs de MyChart de [place] o por telfono llamando al [phone] y presione la opcin 4.  

## 2023-01-31 ENCOUNTER — Encounter: Payer: Self-pay | Admitting: Dermatology

## 2023-02-20 ENCOUNTER — Ambulatory Visit (INDEPENDENT_AMBULATORY_CARE_PROVIDER_SITE_OTHER): Payer: Medicare Other

## 2023-02-20 DIAGNOSIS — I255 Ischemic cardiomyopathy: Secondary | ICD-10-CM

## 2023-02-20 LAB — CUP PACEART REMOTE DEVICE CHECK
Battery Remaining Longevity: 60 mo
Battery Remaining Percentage: 58 %
Battery Voltage: 2.95 V
Brady Statistic RV Percent Paced: 15 %
Date Time Interrogation Session: 20240326020016
HighPow Impedance: 70 Ohm
HighPow Impedance: 70 Ohm
Implantable Lead Connection Status: 753985
Implantable Lead Implant Date: 20130403
Implantable Lead Location: 753860
Implantable Pulse Generator Implant Date: 20190422
Lead Channel Impedance Value: 360 Ohm
Lead Channel Pacing Threshold Amplitude: 1 V
Lead Channel Pacing Threshold Pulse Width: 0.5 ms
Lead Channel Sensing Intrinsic Amplitude: 11.7 mV
Lead Channel Setting Pacing Amplitude: 2.5 V
Lead Channel Setting Pacing Pulse Width: 0.5 ms
Lead Channel Setting Sensing Sensitivity: 0.5 mV
Pulse Gen Serial Number: 9811176
Zone Setting Status: 755011

## 2023-03-16 ENCOUNTER — Other Ambulatory Visit: Payer: Self-pay | Admitting: Internal Medicine

## 2023-03-27 ENCOUNTER — Telehealth: Payer: Self-pay | Admitting: Internal Medicine

## 2023-03-27 MED ORDER — ENTRESTO 24-26 MG PO TABS: 1.0000 | ORAL_TABLET | Freq: Two times a day (BID) | ORAL | 0 refills | Status: DC

## 2023-03-27 NOTE — Telephone Encounter (Signed)
Requested Prescriptions   Signed Prescriptions Disp Refills   sacubitril-valsartan (ENTRESTO) 24-26 MG 180 tablet 0    Sig: Take 1 tablet by mouth 2 (two) times daily.    Authorizing Provider: Sondra Barges    Ordering User: Thayer Headings, Aairah Negrette L

## 2023-03-27 NOTE — Telephone Encounter (Signed)
*  STAT* If patient is at the pharmacy, call can be transferred to refill team.   1. Which medications need to be refilled? (please list name of each medication and dose if known) sacubitril-valsartan (ENTRESTO) 24-26 MG   2. Which pharmacy/location (including street and city if local pharmacy) is medication to be sent to?[facility]) - [place],  - [address] [employer]  3. Do they need a 30 day or 90 day supply? 90 Day Supply

## 2023-04-03 NOTE — Progress Notes (Signed)
Remote ICD transmission.   

## 2023-04-07 ENCOUNTER — Other Ambulatory Visit: Payer: Self-pay | Admitting: Cardiovascular Disease

## 2023-04-07 DIAGNOSIS — I472 Ventricular tachycardia, unspecified: Secondary | ICD-10-CM

## 2023-04-07 DIAGNOSIS — I482 Chronic atrial fibrillation, unspecified: Secondary | ICD-10-CM

## 2023-04-29 ENCOUNTER — Other Ambulatory Visit: Payer: Self-pay | Admitting: Internal Medicine

## 2023-04-30 NOTE — Telephone Encounter (Signed)
Prescription refill request for Xarelto received.  Indication: afib  Last office visit: [place], 01/05/2023 Weight: 97.7kg  Age: 82 yo  Scr: 1.9, 02/27/2023 CrCl: 41 ml/min   Refill sent.

## 2023-05-22 ENCOUNTER — Ambulatory Visit (INDEPENDENT_AMBULATORY_CARE_PROVIDER_SITE_OTHER): Payer: Medicare Other

## 2023-05-22 DIAGNOSIS — I255 Ischemic cardiomyopathy: Secondary | ICD-10-CM

## 2023-05-23 LAB — CUP PACEART REMOTE DEVICE CHECK
Battery Remaining Longevity: 58 mo
Battery Remaining Percentage: 55 %
Battery Voltage: 2.95 V
Brady Statistic RV Percent Paced: 14 %
Date Time Interrogation Session: 20240625020016
HighPow Impedance: 73 Ohm
HighPow Impedance: 73 Ohm
Implantable Lead Connection Status: 753985
Implantable Lead Implant Date: 20130403
Implantable Lead Location: 753860
Implantable Pulse Generator Implant Date: 20190422
Lead Channel Impedance Value: 360 Ohm
Lead Channel Pacing Threshold Amplitude: 1 V
Lead Channel Pacing Threshold Pulse Width: 0.5 ms
Lead Channel Sensing Intrinsic Amplitude: 11.7 mV
Lead Channel Setting Pacing Amplitude: 2.5 V
Lead Channel Setting Pacing Pulse Width: 0.5 ms
Lead Channel Setting Sensing Sensitivity: 0.5 mV
Pulse Gen Serial Number: 9811176
Zone Setting Status: 755011

## 2023-05-28 ENCOUNTER — Other Ambulatory Visit: Payer: Self-pay | Admitting: Internal Medicine

## 2023-05-29 ENCOUNTER — Other Ambulatory Visit: Payer: Self-pay | Admitting: Internal Medicine

## 2023-06-05 ENCOUNTER — Other Ambulatory Visit
Admission: RE | Admit: 2023-06-05 | Discharge: 2023-06-05 | Disposition: A | Discharge status: Home / Self Care | Payer: Medicare Other | Source: Ambulatory Visit | Attending: Internal Medicine | Admitting: Internal Medicine

## 2023-06-05 ENCOUNTER — Ambulatory Visit: Payer: Medicare Other | Attending: Internal Medicine | Admitting: Internal Medicine

## 2023-06-05 ENCOUNTER — Encounter: Payer: Self-pay | Admitting: Internal Medicine

## 2023-06-05 VITALS — BP 125/72 | HR 59 | Ht 70.0 in | Wt 209.0 lb

## 2023-06-05 DIAGNOSIS — Z95810 Presence of automatic (implantable) cardiac defibrillator: Secondary | ICD-10-CM | POA: Insufficient documentation

## 2023-06-05 DIAGNOSIS — I472 Ventricular tachycardia, unspecified: Secondary | ICD-10-CM

## 2023-06-05 DIAGNOSIS — I4821 Permanent atrial fibrillation: Secondary | ICD-10-CM | POA: Diagnosis present

## 2023-06-05 DIAGNOSIS — Z79899 Other long term (current) drug therapy: Secondary | ICD-10-CM | POA: Insufficient documentation

## 2023-06-05 LAB — CUP PACEART INCLINIC DEVICE CHECK
Battery Remaining Longevity: 58 mo
Brady Statistic RV Percent Paced: 14 %
Date Time Interrogation Session: 20240709122609
HighPow Impedance: 78.75 Ohm
Implantable Lead Connection Status: 753985
Implantable Lead Implant Date: 20130403
Implantable Lead Location: 753860
Implantable Pulse Generator Implant Date: 20190422
Lead Channel Impedance Value: 375 Ohm
Lead Channel Pacing Threshold Amplitude: 1.25 V
Lead Channel Pacing Threshold Amplitude: 1.25 V
Lead Channel Pacing Threshold Pulse Width: 0.5 ms
Lead Channel Pacing Threshold Pulse Width: 0.5 ms
Lead Channel Sensing Intrinsic Amplitude: 11.7 mV
Lead Channel Setting Pacing Amplitude: 2.5 V
Lead Channel Setting Pacing Pulse Width: 0.5 ms
Lead Channel Setting Sensing Sensitivity: 0.5 mV
Pulse Gen Serial Number: 9811176
Zone Setting Status: 755011

## 2023-06-05 LAB — TSH: TSH: 3.765 u[IU]/mL (ref 0.350–4.500)

## 2023-06-05 LAB — BASIC METABOLIC PANEL
Anion gap: 9 (ref 5–15)
BUN: 36 mg/dL — ABNORMAL HIGH (ref 8–23)
CO2: 24 mmol/L (ref 22–32)
Calcium: 9.5 mg/dL (ref 8.9–10.3)
Chloride: 103 mmol/L (ref 98–111)
Creatinine, Ser: 2.09 mg/dL — ABNORMAL HIGH (ref 0.61–1.24)
GFR, Estimated: 31 mL/min — ABNORMAL LOW (ref >60)
Glucose, Bld: 124 mg/dL — ABNORMAL HIGH (ref 70–99)
Potassium: 4.9 mmol/L (ref 3.5–5.1)
Sodium: 136 mmol/L (ref 135–145)

## 2023-06-05 MED ORDER — AMIODARONE HCL 200 MG PO TABS: ORAL_TABLET | ORAL | 3 refills | Status: DC

## 2023-06-05 NOTE — Progress Notes (Signed)
Patient ID: Andres [name]., male   DOB: [birth year], 82 y.o.   MRN: [id number]        Patient Care Team: Kandyce Rud, MD as PCP - General (Family Medicine) [name], MD as PCP - Electrophysiology (Cardiology) [name], MD as PCP - Cardiology (Cardiology)   HPI  Andres Riding Sr. is a 82 y.o. 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 bypass had been  done emergently in 1994   Underwent generator replacement 4/19 (GT)  Permanent atrial fibrillation on Xarelto without bleeding   Interval syncope assoc with rapid pace terminated VT, multiple recurrences and treated with amiodarone     Patient denies symptoms of respiratory, GI intolerance, sun sensitivity, neurological symptoms attributable to amiodarone.       The patient denies chest pain, nocturnal dyspnea, orthopnea or peripheral edema.  There have been no palpitations, lightheadedness or syncope.  Complains of dyspnea on exertion and fatigue.[name]   He is out, going back to church, working in the yard, [employer].  May be even interested in going out to lunch with somebody.    His wife died 2023-04-19.  64 years.  Name Andres [name].  Correlate with repeated BMET plays thank you much    DATE TEST EF%   2011  Echo [facility]  14 %   12/16 [facility]  15 % SVG-OM T; LAD/RCA non obstru  8/20 Echo  30-35% LAE mod  7/21 Echo  20-25%   02/22 LHC   SVG-OM3 T; CXm-90% LAD/RCA nonobstructive  3/22 Echo  25-30%     Date Cr K TSH LFT Dig Hgb  4/18  1.4 5.2       4/19 0.36 4.2    14.3  5/20 1.3 4.8 (2/20)    15.6  7/21 1.36 4.5    16.0  12/21 1.47 4.1 5.49 (CE)  19 0.6(10/21) 14.9  5/22(CE) 1.9 4.7 9.68(4/22) 15 0.5 (7/22) 15.1  3/23 (CE)  1.7 4.7 4.67 17 (1/23)  14.6 (1/23)  9/23 1.64 5.1 6.08 20 0.4 13.6  6/24 (CE) 2.2 4.7 4.9   14.5         Thromboembolic risk factors ( age  -2, HTN-1, DM-1, Vasc disease -1, CHF-1) for a CHADSVASc Score of >=6   Past Medical History:  Diagnosis Date   Actinic keratosis    Arthritis    Atrial fibrillation, persistent-long-term    Chronic systolic congestive heart failure (HCC)    a. 03/2015 Echo: EF 30-35%, diff HK, mild MR, mod dil LA, mildly dil RA, mild TR.   Coronary artery disease    a. 1994 s/p CABG x 1 (VG->OM3);  b. 10/2015 Cath: LM nl, LAD min irregs, D1 nl, D2 min irregs, LCX 85m, OM2 nl, OM3 90 (small territory), VG->OM3 100, RCA min irregs, RPDA/RPL1/RPL2/RPL3 nl, EF 25%-->Med Rx.   Dysrhythmia    Hyperlipidemia    ICD (implantable cardiac defibrillator) in place    a. 02/2012 s/p SJM [id number] Fortify Assurance single lead AICD.   Mixed Ischemic and Non-ischemic Cardiomyopathy    a. 07/2012 s/p SJM AICD (RV lead only);  b. 03/2015 Echo: EF 30-35%, diff HK;  c. EF 25% by LV gram.   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 07/2012 s/ SJM AICD.    Past Surgical History:  Procedure Laterality Date   basel cell removal     left arm   CARDIAC CATHETERIZATION  02/28/2012   Minor disease in LAD and RCA, LCX: 70% mid and occluded distally. Patent SVG to OM3   CARDIAC CATHETERIZATION N/A 11/08/2015   Procedure: Left Heart Cath and Cors/Grafts Angiography;  Surgeon: [name], MD;  Location: [facility] INVASIVE CV LAB;  Service: Cardiovascular;  Laterality: N/A;   CARDIOVERSION  03/01/2012   Procedure: CARDIOVERSION;  Surgeon: [name], MD;  Location[facility];  Service: Cardiovascular;  Laterality: N/A;   CATARACT EXTRACTION W/ INTRAOCULAR LENS  IMPLANT, BILATERAL  2011   CORONARY ARTERY BYPASS GRAFT  1994   [facility] ; CABG X1   ELECTROPHYSIOLOGY STUDY N/A  02/28/2012   Procedure: ELECTROPHYSIOLOGY STUDY;  Surgeon: [name], MD;  Location: [facility] CATH LAB;  Service: Cardiovascular;  Laterality: N/A;   ICD GENERATOR CHANGEOUT N/A 03/18/2018   Procedure: ICD GENERATOR CHANGEOUT;  Surgeon: [name], MD;  Location: [facility] INVASIVE CV LAB;  Service: Cardiovascular;  Laterality: N/A;   INSERT / REPLACE / REMOVE PACEMAKER  02/28/12   "w/defibrillator"   JOINT REPLACEMENT     KNEE ARTHROSCOPY  2012   right   LEFT HEART CATH AND CORONARY ANGIOGRAPHY N/A 01/10/2021   Procedure: LEFT HEART CATH AND CORONARY ANGIOGRAPHY;  Surgeon: [name], MD;  Location: [facility] INVASIVE CV LAB;  Service: Cardiovascular;  Laterality: N/A;   LEFT HEART CATHETERIZATION WITH CORONARY/GRAFT ANGIOGRAM N/A 02/28/2012   Procedure: LEFT HEART CATHETERIZATION WITH [name];  Surgeon: [name], MD;  Location: [facility] CATH LAB;  Service: Cardiovascular;  Laterality: N/A;   TOTAL KNEE ARTHROPLASTY  2011   left    Current Outpatient Medications  Medication Sig Dispense Refill   albuterol (VENTOLIN HFA) 108 (90 Base) MCG/ACT inhaler Inhale 2 puffs into the lungs every 6 (six) hours as needed for wheezing or shortness of breath. 8 g 2   amiodarone (PACERONE) 200 MG tablet TAKE 1 TABLET BY MOUTH DAILY 90 tablet 0   benzonatate (TESSALON PERLES) 100 MG capsule Take 2 capsules (200 mg total) by mouth 3 (three) times daily as needed for cough. 30 capsule 0   Cinnamon 500 MG TABS Take 1,000 mg by mouth 2 (two) times daily.      Coenzyme Q10 (CO Q 10 PO) Take 300 mg by mouth daily.     Continuous Blood Gluc Sensor (DEXCOM G7 SENSOR) MISC USE 1 SENSOR EVERY 10 DAYS     eplerenone (INSPRA) 25 MG tablet TAKE ONE-HALF TABLET BY MOUTH  DAILY 45 tablet 0   fluticasone (FLONASE) 50 MCG/ACT nasal spray Place 1 spray into the nose as needed for allergies or rhinitis.     folic acid (FOLVITE) 1 MG tablet Take 1 mg by mouth daily.      furosemide (LASIX) 20 MG tablet Take 20 mg  by mouth daily. Added by PCP.     Garlic 1000 MG CAPS Take 1 capsule by mouth daily.      Glucosamine-Chondroit-Vit C-Mn (GLUCOSAMINE CHONDR 1500 COMPLX PO) Take 1,500 mg by mouth 2 (two) times daily.      insulin NPH Human (HUMULIN N,NOVOLIN N) 100 UNIT/ML injection Inject 85 Units into the  skin at bedtime.     insulin regular (NOVOLIN R,HUMULIN R) 100 units/mL injection Inject 20 Units into the skin in the morning.     levocetirizine (XYZAL) 5 MG tablet Take 5 mg by mouth every evening.     levothyroxine (SYNTHROID) 75 MCG tablet Take 75 mcg by mouth daily.     meclizine (ANTIVERT) 25 MG tablet Take 25 mg by mouth 3 (three) times daily as needed for dizziness.     metFORMIN (GLUCOPHAGE) 500 MG tablet Take 500 mg tablet in the am with 0.5 tablet in the pm.     metoprolol succinate (TOPROL-XL) 25 MG 24 hr tablet TAKE 1 TABLET BY MOUTH AT  BEDTIME 90 tablet 0   mometasone (ELOCON) 0.1 % cream Apply 1 application topically daily as needed (Rash). 45 g 0   Omega-3 Fatty Acids (FISH OIL PO) Take 1 tablet by mouth 2 (two) times daily.     Rivaroxaban (XARELTO) 15 MG TABS tablet TAKE 1 TABLET BY MOUTH ONCE  DAILY WITH A MEAL 100 tablet 1   rosuvastatin (CRESTOR) 10 MG tablet Take 10 mg by mouth daily.     sacubitril-valsartan (ENTRESTO) 24-26 MG Take 1 tablet by mouth 2 (two) times daily. 180 tablet 0   traZODone (DESYREL) 50 MG tablet Take 1 tablet by mouth at bedtime.     No current facility-administered medications for this visit.    No Known Allergies  Physical Exam: BP (!) 152/75   Pulse (!) 59   Ht 5\' 10"  (1.778 m)   Wt 209 lb (94.8 kg)   SpO2 97%   BMI 29.99 kg/m  Well developed and well nourished in no acute distress HENT normal Neck supple with JVP-flat Clear Device pocket well healed; without hematoma or erythema.  There is no tethering  irregular rate and rhythm no murmur Abd-soft with active BS No Clubbing cyanosis  edema Skin-warm and dry A & Oriented  Grossly normal  sensory and motor function  ECG atrial fibrillation at 59 -/13/47 Access right 113  Device function is normal. Programming changes none See Paceart for details    Assessment and  Plan:  Congestive heart failure- chronic-systolic  Atrial fibrillation-permanent  Cardiomyopathy ischemic/nonischemic  Orthostatic intolerance-   High Risk Medication Surveillance  /AMIO   Ventricular tachycardia -monomorphic-rapid  ICD single chamber-St. Jude   Bradycardia  Renal insufficiency grade 3b   Treated hypothyroidism  grief  Volume status is stable.  Still has class III symptoms but is able to work in the yard and playing golf. Continue GDMT with low-dose eplerenone, metoprolol and Entresto.  He might well be a candidate for an SGLT2.  Last we discussed this with his primary care physician given his diabetes  No bleeding.  Continue the Xarelto dosed for renal function  Last creatinine was up, we will recheck it.  May require further medication adjustments.    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  Will decrease the amiodarone from 7--5 days a week as her been no intercurrent ventricular tachycardia  There may be a degree of chronotropic incompetence.  However, given his single-chamber device, would be loath to force ventricular pacing and widening of the QRS

## 2023-06-05 NOTE — Patient Instructions (Signed)
Medication Instructions:  Your physician has recommended you make the following change in your medication:  1) DECREASE amiodarone to 5 days per week *If you need a refill on your cardiac medications before your next appointment, please call your pharmacy*  Labs: BMET You will get your lab work at [facility]) hospital.  Your lab work will be done at the [facility] mall next to the [employer] building.  These are walk in labs- you will not need an appointment and you do not need to be fasting.     Follow-Up: At [facility], you and your health needs are our priority.  As part of our continuing mission to provide you with exceptional heart care, we have created designated Provider Care Teams.  These Care Teams include your primary Cardiologist (physician) and Advanced Practice Providers (APPs -  Physician Assistants and Nurse Practitioners) who all work together to provide you with the care you need, when you need it.  Your next appointment:   6 month(s)  Provider:   Sherryl Manges, MD

## 2023-06-06 ENCOUNTER — Other Ambulatory Visit: Payer: Self-pay | Admitting: Physician Assistant

## 2023-06-07 ENCOUNTER — Other Ambulatory Visit: Payer: Self-pay | Admitting: Physician Assistant

## 2023-06-08 NOTE — Telephone Encounter (Signed)
Patient is seeing dr Kirke Corin on 10/10

## 2023-06-08 NOTE — Telephone Encounter (Signed)
Please schedule 6 month F/U appointment. Thank you! 

## 2023-06-15 NOTE — Progress Notes (Signed)
Remote ICD transmission.   

## 2023-06-19 ENCOUNTER — Encounter: Payer: Self-pay | Admitting: Internal Medicine

## 2023-06-25 ENCOUNTER — Telehealth: Payer: Self-pay

## 2023-06-25 DIAGNOSIS — R7989 Other specified abnormal findings of blood chemistry: Secondary | ICD-10-CM

## 2023-06-25 DIAGNOSIS — Z79899 Other long term (current) drug therapy: Secondary | ICD-10-CM

## 2023-06-25 NOTE — Telephone Encounter (Signed)
Attempted phone call to pt and left voicemail message.  OK per EPIC to leave detailed message.  Pt advised per Dr Graciela Husbands labs are normal except worsening kidney function (Creatinine)  Dr Graciela Husbands would like for pt to repeat labs and also follow up with PCP regarding renal function.  Pt advised to have labs drawn at [facility], medical mall.  Please call [phone] for further questions.

## 2023-06-25 NOTE — Telephone Encounter (Signed)
-----   Message from Sherryl Manges sent at 06/22/2023  2:38 PM EDT ----- Please Inform Patient   Labs are normal x worsening Cr -- no changes in entresto or eplernone  We should repeat and have him followp with his PCP      Thanks

## 2023-06-29 ENCOUNTER — Other Ambulatory Visit
Admission: RE | Admit: 2023-06-29 | Discharge: 2023-06-29 | Disposition: A | Discharge status: Home / Self Care | Payer: Medicare Other | Attending: Internal Medicine | Admitting: Internal Medicine

## 2023-06-29 DIAGNOSIS — Z79899 Other long term (current) drug therapy: Secondary | ICD-10-CM | POA: Diagnosis present

## 2023-06-29 DIAGNOSIS — R7989 Other specified abnormal findings of blood chemistry: Secondary | ICD-10-CM | POA: Diagnosis present

## 2023-06-29 LAB — BASIC METABOLIC PANEL
Anion gap: 9 (ref 5–15)
BUN: 36 mg/dL — ABNORMAL HIGH (ref 8–23)
CO2: 23 mmol/L (ref 22–32)
Calcium: 9 mg/dL (ref 8.9–10.3)
Chloride: 106 mmol/L (ref 98–111)
Creatinine, Ser: 1.89 mg/dL — ABNORMAL HIGH (ref 0.61–1.24)
GFR, Estimated: 35 mL/min — ABNORMAL LOW (ref >60)
Glucose, Bld: 116 mg/dL — ABNORMAL HIGH (ref 70–99)
Potassium: 4.7 mmol/L (ref 3.5–5.1)
Sodium: 138 mmol/L (ref 135–145)

## 2023-07-02 ENCOUNTER — Telehealth: Payer: Self-pay

## 2023-07-02 NOTE — Telephone Encounter (Signed)
Attempted phone call to pt.  OK to leave detailed message and pt advised per Dr Graciela Husbands labs are normal with mild improvement in kidney function.  Pt may call [phone] for any further questions or concerns.

## 2023-07-02 NOTE — Telephone Encounter (Signed)
-----   Message from Sherryl Manges sent at 06/22/2023  2:38 PM EDT ----- Please Inform Patient   Labs are normal x worsening Cr -- no changes in entresto or eplernone  We should repeat and have him followp with his PCP      Thanks

## 2023-07-17 ENCOUNTER — Other Ambulatory Visit: Payer: Self-pay | Admitting: Nephrology

## 2023-07-17 DIAGNOSIS — R809 Proteinuria, unspecified: Secondary | ICD-10-CM

## 2023-07-17 DIAGNOSIS — E1122 Type 2 diabetes mellitus with diabetic chronic kidney disease: Secondary | ICD-10-CM

## 2023-07-17 DIAGNOSIS — N1832 Chronic kidney disease, stage 3b: Secondary | ICD-10-CM

## 2023-07-23 ENCOUNTER — Ambulatory Visit
Admission: RE | Admit: 2023-07-23 | Discharge: 2023-07-23 | Disposition: A | Discharge status: Home / Self Care | Payer: Medicare Other | Source: Ambulatory Visit | Attending: Nephrology | Admitting: Nephrology

## 2023-07-23 DIAGNOSIS — R809 Proteinuria, unspecified: Secondary | ICD-10-CM | POA: Diagnosis present

## 2023-07-23 DIAGNOSIS — E1122 Type 2 diabetes mellitus with diabetic chronic kidney disease: Secondary | ICD-10-CM | POA: Diagnosis present

## 2023-07-23 DIAGNOSIS — N1832 Chronic kidney disease, stage 3b: Secondary | ICD-10-CM | POA: Insufficient documentation

## 2023-07-27 ENCOUNTER — Other Ambulatory Visit: Payer: Self-pay | Admitting: Internal Medicine

## 2023-08-21 ENCOUNTER — Ambulatory Visit: Payer: Medicare Other

## 2023-08-21 DIAGNOSIS — I5022 Chronic systolic (congestive) heart failure: Secondary | ICD-10-CM

## 2023-08-21 DIAGNOSIS — I255 Ischemic cardiomyopathy: Secondary | ICD-10-CM

## 2023-08-22 LAB — CUP PACEART REMOTE DEVICE CHECK
Battery Remaining Longevity: 56 mo
Battery Remaining Percentage: 54 %
Battery Voltage: 2.95 V
Brady Statistic RV Percent Paced: 12 %
Date Time Interrogation Session: 20240924020018
HighPow Impedance: 81 Ohm
HighPow Impedance: 81 Ohm
Implantable Lead Connection Status: 753985
Implantable Lead Implant Date: 20130403
Implantable Lead Location: 753860
Implantable Pulse Generator Implant Date: 20190422
Lead Channel Impedance Value: 360 Ohm
Lead Channel Pacing Threshold Amplitude: 1.25 V
Lead Channel Pacing Threshold Pulse Width: 0.5 ms
Lead Channel Sensing Intrinsic Amplitude: 11.7 mV
Lead Channel Setting Pacing Amplitude: 2.5 V
Lead Channel Setting Pacing Pulse Width: 0.5 ms
Lead Channel Setting Sensing Sensitivity: 0.5 mV
Pulse Gen Serial Number: 9811176
Zone Setting Status: 755011

## 2023-09-06 ENCOUNTER — Ambulatory Visit: Payer: Medicare Other | Admitting: Cardiovascular Disease

## 2023-09-07 NOTE — Progress Notes (Signed)
Remote ICD transmission.   

## 2023-09-26 ENCOUNTER — Other Ambulatory Visit: Payer: Self-pay | Admitting: Physician Assistant

## 2023-10-04 ENCOUNTER — Other Ambulatory Visit: Payer: Self-pay | Admitting: Internal Medicine

## 2023-10-05 NOTE — Telephone Encounter (Signed)
Prescription refill request for Xarelto received.  Indication:afib Last office visit:7/24 Weight:94.8  kg Age:82 Scr:1.89  8/24 CrCl:40.41  ml/min  Prescription refilled

## 2023-10-06 ENCOUNTER — Other Ambulatory Visit: Payer: Self-pay | Admitting: Cardiovascular Disease

## 2023-11-01 ENCOUNTER — Encounter: Payer: Self-pay | Admitting: Emergency Medicine

## 2023-11-01 ENCOUNTER — Emergency Department
Admission: EM | Admit: 2023-11-01 | Discharge: 2023-11-01 | Disposition: A | Discharge status: Home / Self Care | Payer: Medicare Other | Attending: Emergency Medicine | Admitting: Emergency Medicine

## 2023-11-01 ENCOUNTER — Emergency Department: Payer: Medicare Other

## 2023-11-01 ENCOUNTER — Other Ambulatory Visit: Payer: Self-pay

## 2023-11-01 DIAGNOSIS — R079 Chest pain, unspecified: Secondary | ICD-10-CM | POA: Insufficient documentation

## 2023-11-01 DIAGNOSIS — N189 Chronic kidney disease, unspecified: Secondary | ICD-10-CM | POA: Insufficient documentation

## 2023-11-01 DIAGNOSIS — E119 Type 2 diabetes mellitus without complications: Secondary | ICD-10-CM | POA: Insufficient documentation

## 2023-11-01 DIAGNOSIS — Z95810 Presence of automatic (implantable) cardiac defibrillator: Secondary | ICD-10-CM | POA: Insufficient documentation

## 2023-11-01 DIAGNOSIS — Z7901 Long term (current) use of anticoagulants: Secondary | ICD-10-CM | POA: Diagnosis not present

## 2023-11-01 DIAGNOSIS — Z955 Presence of coronary angioplasty implant and graft: Secondary | ICD-10-CM | POA: Diagnosis not present

## 2023-11-01 DIAGNOSIS — I129 Hypertensive chronic kidney disease with stage 1 through stage 4 chronic kidney disease, or unspecified chronic kidney disease: Secondary | ICD-10-CM | POA: Diagnosis not present

## 2023-11-01 DIAGNOSIS — R0689 Other abnormalities of breathing: Secondary | ICD-10-CM | POA: Diagnosis not present

## 2023-11-01 LAB — CBC
HCT: 43.9 % (ref 39.0–52.0)
Hemoglobin: 14.4 g/dL (ref 13.0–17.0)
MCH: 32.7 pg (ref 26.0–34.0)
MCHC: 32.8 g/dL (ref 30.0–36.0)
MCV: 99.5 fL (ref 80.0–100.0)
Platelets: 178 10*3/uL (ref 150–400)
RBC: 4.41 MIL/uL (ref 4.22–5.81)
RDW: 14 % (ref 11.5–15.5)
WBC: 7 10*3/uL (ref 4.0–10.5)
nRBC: 0 % (ref 0.0–0.2)

## 2023-11-01 LAB — PROTIME-INR
INR: 2.2 — ABNORMAL HIGH (ref 0.8–1.2)
Prothrombin Time: 24.6 s — ABNORMAL HIGH (ref 11.4–15.2)

## 2023-11-01 LAB — BASIC METABOLIC PANEL
Anion gap: 10 (ref 5–15)
BUN: 40 mg/dL — ABNORMAL HIGH (ref 8–23)
CO2: 23 mmol/L (ref 22–32)
Calcium: 8.9 mg/dL (ref 8.9–10.3)
Chloride: 104 mmol/L (ref 98–111)
Creatinine, Ser: 1.99 mg/dL — ABNORMAL HIGH (ref 0.61–1.24)
GFR, Estimated: 33 mL/min — ABNORMAL LOW (ref >60)
Glucose, Bld: 139 mg/dL — ABNORMAL HIGH (ref 70–99)
Potassium: 4.2 mmol/L (ref 3.5–5.1)
Sodium: 137 mmol/L (ref 135–145)

## 2023-11-01 LAB — TROPONIN I (HIGH SENSITIVITY)
Troponin I (High Sensitivity): 15 ng/L (ref <18)
Troponin I (High Sensitivity): 16 ng/L (ref <18)

## 2023-11-01 LAB — BRAIN NATRIURETIC PEPTIDE: B Natriuretic Peptide: 393.7 pg/mL — ABNORMAL HIGH (ref 0.0–100.0)

## 2023-11-01 NOTE — ED Provider Notes (Signed)
[facility] Provider Note    Event Date/Time   First MD Initiated Contact with Patient 11/01/23 1541     (approximate)   History   Chest Pain   HPI  Andres TILLIS Sr. is a 82 y.o. male with ischemic cardiomyopathy with ICD, CKD, hypertension, diabetes who comes in with concern for chest pain.  Patient reports some intermittent left-sided chest pain that started yesterday.  He reports that it comes and goes.  He states that will last a few hours.  He denies any significant arm use although he does report that he was doing a lot of dusting and so he is not sure if it is related to that.  Denies any shortness of breath.  He reports the pain is currently not there.  He denies the chest pain being exertional in nature.  Patient is on a blood thinner, Xarelto.  He denies any shortness of breath, cough, fevers.  He denies any new swelling in his legs.  He denies any passing out or feeling any shocks.  Patient reports that his last stent was years ago patient does report catheterization 2 years ago that showed that he had nonobstructive stents.  I reviewed the notes were patient is followed by [facility] health cardiology.  Reviewed the note from 01/05/2023.  Does have history of V. tach and is currently on amiodarone.     Physical Exam   Triage Vital Signs: ED Triage Vitals [11/01/23 1457]  Encounter Vitals Group     BP 119/66     Systolic BP Percentile      Diastolic BP Percentile      Pulse Rate 71     Resp 18     Temp 99.2 F (37.3 C)     Temp Source Oral     SpO2 92 %     Weight 212 lb (96.2 kg)     Height 5\' 10"  (1.778 m)     Head Circumference      Peak Flow      Pain Score 2     Pain Loc      Pain Education      Exclude from Growth Chart     Most recent vital signs: Vitals:   11/01/23 1457  BP: 119/66  Pulse: 71  Resp: 18  Temp: 99.2 F (37.3 C)  SpO2: 92%     General: Awake, no distress.  CV:  Good peripheral perfusion.  Resp:  Normal  effort.  Abd:  No distention.  Other:  No swelling in legs.  No calf tenderness.  No rash noted over the chest wall.  Clear lungs bilaterally.  ICD palpated without any warmth or redness noted.   ED Results / Procedures / Treatments   Labs (all labs ordered are listed, but only abnormal results are displayed) Labs Reviewed  BASIC METABOLIC PANEL - Abnormal; Notable for the following components:      Result Value   Glucose, Bld 139 (*)    BUN 40 (*)    Creatinine, Ser 1.99 (*)    GFR, Estimated 33 (*)    All other components within normal limits  PROTIME-INR - Abnormal; Notable for the following components:   Prothrombin Time 24.6 (*)    INR 2.2 (*)    All other components within normal limits  CBC  TROPONIN I (HIGH SENSITIVITY)     EKG  My interpretation of EKG:  A-fib with a rate of 69 without any ST elevation,  T wave version in lead III, left posterior fascicular block  Reviewed prior EKG and he was also in A-fib with T wave version in lead III  RADIOLOGY I have reviewed the xray personally and interpreted he has an ICD in place.  No obvious pneumonia   PROCEDURES:  Critical Care performed: No  .1-3 Lead EKG Interpretation  Performed by: Concha Se, MD Authorized by: Concha Se, MD     Interpretation: abnormal     ECG rate:  60   ECG rate assessment: normal     Rhythm: atrial fibrillation     Ectopy: none     Conduction: normal      MEDICATIONS ORDERED IN ED: Medications - No data to display   IMPRESSION / MDM / ASSESSMENT AND PLAN / ED COURSE  I reviewed the triage vital signs and the nursing notes.   Patient's presentation is most consistent with acute presentation with potential threat to life or bodily function.   Patient comes in with some intermittent chest pain.  On examination he currently denies any pain.  I doubt PE given no shortness of breath and patient is on blood thinner.  Again he does not have any pain so I doubt dissection.   Will get cardiac markers to evaluate for ACS.  Does not sound like typical angina given it is not exertional in nature.  He denies any shocks or syncope however given he has a history of V. tach we will interrogated to ensure that there is no signs of V. tach that is causing chest pain.  INR is slightly elevated.  BMP shows creatinine around baseline.  CBC shows stable hemoglobin.  Troponin is negative.  His BNP is downtrending from 1 year ago and on exam he looks euvolemic.  Chest x-ray without any edema.  Nurse Jerilee Field discussed with the representative for the ICD and there were no shocks today or episodes of abnormalities.  7:05 PM reevaluated patient his repeat troponin is negative.  On repeat evaluation he remains without any symptoms.  His chest pain was not exertional in nature.  We discussed the risk for having coronary disease that just not being seen with the troponins and that he still needs close follow-up with cardiology note that he has return of chest pain worsening chest pain or any other concerns she should have a repeat evaluation as I cannot predict future heart attacks.  We discussed admission versus going home but at this time he feels comfortable going home given he is asymptomatic and feels at his baseline self with 2 negative troponins.    The patient is on the cardiac monitor to evaluate for evidence of arrhythmia and/or significant heart rate changes.      FINAL CLINICAL IMPRESSION(S) / ED DIAGNOSES   Final diagnoses:  Chest pain, unspecified type     Rx / DC Orders   ED Discharge Orders     None        Note:  This document was prepared using Dragon voice recognition software and may include unintentional dictation errors.   Concha Se, MD 11/01/23 Ebony Cargo

## 2023-11-01 NOTE — Discharge Instructions (Addendum)
Call your cardiology team to make a follow-up appointment and return to the ER if you develop worsening symptoms or any other concerns.  Your cardiac markers were negative today. You are still high risk for heart problems so if things are changing or getting worse please return to the ER emergently

## 2023-11-01 NOTE — ED Triage Notes (Signed)
Patient to ED via POV for CP. Left-sided, non-radiating, intermittent that started yesterday. Hx of MI, pacemaker and afib.

## 2023-11-06 ENCOUNTER — Ambulatory Visit: Payer: Medicare Other | Attending: Medical | Admitting: Medical

## 2023-11-06 ENCOUNTER — Encounter: Payer: Self-pay | Admitting: Medical

## 2023-11-06 VITALS — BP 122/74 | HR 75 | Ht 71.0 in | Wt 217.2 lb

## 2023-11-06 DIAGNOSIS — E782 Mixed hyperlipidemia: Secondary | ICD-10-CM | POA: Diagnosis not present

## 2023-11-06 DIAGNOSIS — I4821 Permanent atrial fibrillation: Secondary | ICD-10-CM | POA: Diagnosis not present

## 2023-11-06 DIAGNOSIS — I5022 Chronic systolic (congestive) heart failure: Secondary | ICD-10-CM

## 2023-11-06 DIAGNOSIS — N1832 Chronic kidney disease, stage 3b: Secondary | ICD-10-CM

## 2023-11-06 DIAGNOSIS — I25758 Atherosclerosis of native coronary artery of transplanted heart with other forms of angina pectoris: Secondary | ICD-10-CM | POA: Diagnosis not present

## 2023-11-06 DIAGNOSIS — I472 Ventricular tachycardia, unspecified: Secondary | ICD-10-CM

## 2023-11-06 NOTE — Progress Notes (Signed)
Cardiology Office Note:    Date:  11/06/2023   ID:  Andres Riding Sr., DOB May 03, 1941, MRN [id number]  PCP:  Kandyce Rud, MD  [facility] Cardiologist:  Lorine Bears, MD  [facility] Electrophysiologist:  Sherryl Manges, MD   Referring MD: Kandyce Rud, MD   Chief Complaint: ER follow-up  History of Present Illness:    Andres [name]. is a 82 y.o. 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  The patient underwent EP study and April 2013 which showed inducible VT.  Subsequently he underwent implantation of SJM single-lead ICD.  Left heart cath at that time showed a patent SVG to OM 3.  Left heart cath in December 2016 showed an occluded SVG to OM 3.  The mid left circumflex had diffuse 90% disease into a small OM 3, which was diffusely diseased and relatively small in size.  EF was 50% with mildly elevated LVEDP.  Medical therapy was recommended.  Echo in July 2021 showed EF of 20 to 25% with moderate TR.  Patient was admitted in June 2021 with syncope and found to have VT treated with ATP.  CT of the head showed a subdural hematoma which necessitated holding Xarelto at that time. He had recurrent VT in 2022 with repeat left heart cath in February 2022 showing significant underlying one-vessel CAD affecting the mid to distal left circumflex into OM 3 with chronically occluded SVG to OM 3 and stable moderate mid LAD disease.  Overall, there was no significant change from  cardiac cath in 2016.  He was treated with amiodarone.  Metoprolol was previously discontinued due to bradycardia.  Patient was last seen in February 2024 and was overall doing well from a cardiac perspective.  The patient was seen in the ER November 01, 2023 with chest pain.  Vitals were overall stable.  He had a low-grade fever.  High-sensitivity troponin was negative x 2.  EKG showed A-fib with a heart rate of 69 bpm.  Chest x-ray showed no edema.  He denied any ICD shocks.  Symptoms improved and patient was discharged home in stable condition.  Today, the patient reports he went to the ER for chest soreness.  He reported constant chest soreness for 2 days, and it improved after this.  It was not worse with exertion.  Patient felt it was musculoskeletal in nature since he does housework at home.  Due to cardiac history, family members wanted him to go to the ER.   The patient stays fairly active on a daily basis. No shortness of breath, lower leg edema, orthopnea, pnd. He uses a CPAP. He has a lot of dizziness when he walks, however this is chronic and unchanged. He has not fallen and does not use a cane or walker.   Past Medical History:  Diagnosis Date   Actinic keratosis    Arthritis    Atrial fibrillation, persistent-long-term    Chronic systolic congestive heart failure (HCC)    a. 03/2015 Echo: EF 30-35%, diff HK, mild MR, mod dil LA, mildly dil  RA, mild TR.   Coronary artery disease    a. 1994 s/p CABG x 1 (VG->OM3);  b. 10/2015 Cath: LM nl, LAD min irregs, D1 nl, D2 min irregs, LCX 83m, OM2 nl, OM3 90 (small territory), VG->OM3 100, RCA min irregs, RPDA/RPL1/RPL2/RPL3 nl, EF 25%-->Med Rx.   Dysrhythmia    Hyperlipidemia    ICD (implantable cardiac defibrillator) in place    a. 02/2012 s/p SJM [id number] Fortify Assurance single lead AICD.   Mixed Ischemic and Non-ischemic Cardiomyopathy    a. 07/2012 s/p SJM AICD (RV lead only);  b. 03/2015 Echo: EF 30-35%, diff HK;  c. EF 25% by LV gram.    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 07/2012 s/ SJM AICD.    Past Surgical History:  Procedure Laterality Date   basel cell removal     left arm   CARDIAC CATHETERIZATION  02/28/2012   Minor disease in LAD and RCA, LCX: 70% mid and occluded distally. Patent SVG to OM3   CARDIAC CATHETERIZATION N/A 11/08/2015   Procedure: Left Heart Cath and Cors/Grafts Angiography;  Surgeon: [name], MD;  Location: [facility] INVASIVE CV LAB;  Service: Cardiovascular;  Laterality: N/A;   CARDIOVERSION  03/01/2012   Procedure: CARDIOVERSION;  Surgeon: [name], MD;  Location: [facility] OR;  Service: Cardiovascular;  Laterality: N/A;   CATARACT EXTRACTION W/ INTRAOCULAR LENS  IMPLANT, BILATERAL  2011   CORONARY ARTERY BYPASS GRAFT  1994   [facility] ; CABG X1   ELECTROPHYSIOLOGY STUDY N/A 02/28/2012   Procedure: ELECTROPHYSIOLOGY STUDY;  Surgeon: [name], MD;  Location: [facility] CATH LAB;  Service: Cardiovascular;  Laterality: N/A;   ICD GENERATOR CHANGEOUT N/A 03/18/2018   Procedure: ICD GENERATOR CHANGEOUT;  Surgeon: [name], MD;  Location: [facility] INVASIVE CV LAB;  Service: Cardiovascular;  Laterality: N/A;   INSERT / REPLACE / REMOVE PACEMAKER  02/28/12   "w/defibrillator"   JOINT REPLACEMENT     KNEE ARTHROSCOPY  2012   right   LEFT HEART CATH AND CORONARY ANGIOGRAPHY N/A 01/10/2021   Procedure: LEFT HEART CATH AND CORONARY ANGIOGRAPHY;  Surgeon: [name], MD;  Location: [facility] INVASIVE CV LAB;  Service: Cardiovascular;  Laterality: N/A;   LEFT HEART CATHETERIZATION WITH CORONARY/GRAFT ANGIOGRAM N/A 02/28/2012   Procedure: LEFT HEART CATHETERIZATION WITH [name];  Surgeon: [name], MD;  Location: [facility] CATH LAB;  Service: Cardiovascular;   Laterality: N/A;   TOTAL KNEE ARTHROPLASTY  2011   left    Current Medications: Current Meds  Medication Sig   albuterol (VENTOLIN HFA) 108 (90 Base) MCG/ACT inhaler Inhale 2 puffs into the lungs every 6 (six) hours as needed for wheezing or shortness of breath.   amiodarone (PACERONE) 200 MG tablet Take 1 tablet (200 mg total) five days per week.   Cinnamon 500 MG TABS Take 1,000 mg by mouth 2 (two) times daily.    Coenzyme Q10 (CO Q 10 PO) Take 300 mg by mouth daily.   Continuous Blood Gluc Sensor (DEXCOM G7 SENSOR) MISC USE 1 SENSOR EVERY 10 DAYS   eplerenone (INSPRA) 25 MG tablet TAKE ONE-HALF TABLET BY MOUTH  DAILY   fluticasone (FLONASE) 50 MCG/ACT nasal spray  Place 1 spray into the nose as needed for allergies or rhinitis.   folic acid (FOLVITE) 1 MG tablet Take 1 mg by mouth daily.    furosemide (LASIX) 20 MG tablet Take 20 mg by mouth daily. Added by PCP.   Garlic 1000 MG CAPS Take 1 capsule by mouth daily.    Glucosamine-Chondroit-Vit C-Mn (GLUCOSAMINE CHONDR 1500 COMPLX PO) Take 1,500 mg by mouth 2 (two) times daily.    meclizine (ANTIVERT) 25 MG tablet Take 25 mg by mouth 3 (three) times daily as needed for dizziness.   metoprolol succinate (TOPROL-XL) 25 MG 24 hr tablet TAKE 1 TABLET BY MOUTH AT  BEDTIME   Omega-3 Fatty Acids (FISH OIL PO) Take 1 tablet by mouth 2 (two) times daily.   rosuvastatin (CRESTOR) 10 MG tablet Take 10 mg by mouth daily.   sacubitril-valsartan (ENTRESTO) 24-26 MG TAKE 1 TABLET BY MOUTH TWICE  DAILY   traZODone (DESYREL) 50 MG tablet Take 1 tablet by mouth at bedtime.   TRESIBA FLEXTOUCH 200 UNIT/ML FlexTouch Pen SMARTSIG:90 Unit(s) SUB-Q Every Night   XARELTO 15 MG TABS tablet TAKE 1 TABLET BY MOUTH ONCE  DAILY WITH A MEAL     Allergies:   Patient has no known allergies.   Social History   Socioeconomic History   Marital status: Married    Spouse name: Not on file   Number of children: 3   Years of education: Not on file   Highest education  level: Not on file  Occupational History   Not on file  Tobacco Use   Smoking status: Former    Current packs/day: 0.00    Average packs/day: 1 pack/day for 25.0 years (25.0 ttl pk-yrs)    Types: Cigarettes    Start date: 01/08/1963    Quit date: 01/09/1988    Years since quitting: 35.8   Smokeless tobacco: Never  Vaping Use   Vaping status: Never Used  Substance and Sexual Activity   Alcohol use: No   Drug use: No   Sexual activity: Never  Other Topics Concern   Not on file  Social History Narrative   Not on file   Social Determinants of Health   Financial Resource Strain: Low Risk  (03/06/2023)   Received from [facility], [facility]   Overall Financial Resource Strain (CARDIA)    Difficulty of Paying Living Expenses: Not hard at all  Food Insecurity: No Food Insecurity (03/06/2023)   Received from [facility], [facility]   Hunger Vital Sign    Worried About Running Out of Food in the Last Year: Never true    Ran Out of Food in the Last Year: Never true  Transportation Needs: No Transportation Needs (03/06/2023)   Received from [facility], [facility]   [facility] - Transportation    In the past 12 months, has lack of transportation kept you from medical appointments or from getting medications?: No    Lack of Transportation (Non-Medical): No  Physical Activity: Not on file  Stress: Not on file  Social Connections: Not on file     Family History: The patient's family history includes Heart attack in his brother and father.  ROS:   Please see the history of present illness.     All other systems reviewed and are negative.  EKGs/Labs/Other Studies Reviewed:    The following studies were reviewed today:  Echo on 2022 1. Left ventricular ejection fraction,  by estimation, is 25 to 30%. The  left ventricle has severely decreased function. The left ventricle   demonstrates global hypokinesis. The left ventricular internal cavity size  was mildly dilated. Left ventricular  diastolic parameters are indeterminate.   2. Right ventricular systolic function is normal. The right ventricular  size is normal. There is mildly elevated pulmonary artery systolic  pressure.   3. Left atrial size was moderately dilated.   4. Right atrial size was moderately dilated.   5. The mitral valve is normal in structure. Mild mitral valve  regurgitation. No evidence of mitral stenosis.   6. Tricuspid valve regurgitation is mild to moderate.   7. The aortic valve is normal in structure. Aortic valve regurgitation is  not visualized. Mild to moderate aortic valve sclerosis/calcification is  present, without any evidence of aortic stenosis.   8. The inferior vena cava is normal in size with greater than 50%  respiratory variability, suggesting right atrial pressure of 3 mmHg.   LHC  Ost 3rd Mrg to 3rd Mrg lesion is 90% stenosed. Mid LAD lesion is 50% stenosed. Mid Cx to Dist Cx lesion is 90% stenosed. 3rd Mrg-1 lesion is 80% stenosed. 3rd Mrg-2 lesion is 100% stenosed. Ost Cx to Prox Cx lesion is 30% stenosed. SVG. Origin to Prox Graft lesion is 100% stenosed.   1.  Significant underlying one-vessel coronary artery disease affecting the mid to distal left circumflex into OM 3 with chronically occluded SVG to OM 3.  Stable moderate mid LAD disease.  Overall, no significant change from most recent cardiac catheterization in 2016. 2.  Normal left ventricular end-diastolic pressure at 11 to 12 mmHg.  Left ventricular angiography was not performed due to chronic kidney disease.   Recommendations: The distal left circumflex disease is chronic and supplies a small territory mainly OM 3 distribution which is occluded distally and diffusely diseased proximally.  PCI is not recommended.  Overall appearance of the coronary arteries is not different from most recent  catheterization. Recommend continuing medical therapy.  EKG:  EKG is not ordered today.   Recent Labs: 06/05/2023: TSH 3.765 11/01/2023: B Natriuretic Peptide 393.7; BUN 40; Creatinine, Ser 1.99; Hemoglobin 14.4; Platelets 178; Potassium 4.2; Sodium 137  Recent Lipid Panel No results found for: "CHOL", "TRIG", "HDL", "CHOLHDL", "VLDL", "LDLCALC", "LDLDIRECT"  Physical Exam:    VS:  BP 122/74 (BP Location: Left Arm, Patient Position: Sitting, Cuff Size: Normal)   Pulse 75   Ht 5\' 11"  (1.803 m)   Wt 217 lb 3.2 oz (98.5 kg)   SpO2 97%   BMI 30.29 kg/m     Wt Readings from Last 3 Encounters:  11/06/23 217 lb 3.2 oz (98.5 kg)  11/01/23 212 lb (96.2 kg)  06/05/23 209 lb (94.8 kg)     GEN:  Well nourished, well developed in no acute distress HEENT: Normal NECK: No JVD; No carotid bruits LYMPHATICS: No lymphadenopathy CARDIAC: Irregularly irregular, no murmurs, rubs, gallops RESPIRATORY:  Clear to auscultation without rales, wheezing or rhonchi  ABDOMEN: Soft, non-tender, non-distended MUSCULOSKELETAL:  No edema; No deformity  SKIN: Warm and dry NEUROLOGIC:  Alert and oriented x 3 PSYCHIATRIC:  Normal affect   ASSESSMENT:    1. 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 Hyperlipidemia, mixed   3. Chronic systolic heart failure (HCC)   4. Permanent atrial fibrillation (HCC)   5. Ventricular tachycardia (HCC)   6. Stage 3b chronic kidney  disease (HCC)    PLAN:    In order of problems listed above:  Chest discomfort CAD Patient went to the ER for chest soreness.  Workup showed negative troponin and BNP in the 300s.  He was discharged home in stable condition.  Patient reports chest soreness that was constant and worse with movement.  Chest discomfort was not worse with exertion and he had no associated symptoms.  He does daily housework at home and suspects chest soreness was musculoskeletal in nature.   He denies recurrent chest pain or chest soreness.  Overall pain sounds more atypical.  Last heart cath in 2022 with CAD recommending medical therapy  (report above).  We discussed options including medication changes versus noninvasive ischemic testing.  Patient would like to watch and wait at this time.  He will see Dr. Kirke Corin next month.  Continue Toprol 25 mg daily and Crestor 10 mg daily.  No aspirin with Xarelto.  Hyperlipidemia L1 18 with goal less than 70.  We discussed increasing Crestor, however patient would like to wait at this moment.  HFrEF Echo in 2022 showed LVEF of 25 to 30%.  Patient appears euvolemic on exam despite BNP in the 300s.  He takes Lasix 20 mg daily.  Continue Toprol 25 mg daily, lasix, Eplerenone 12.5mg  daily, and Entresto 24-26 mg twice daily.  We discussed repeating an echocardiogram and starting SGLT2 inhibitor, he but he would like to wait at this time.  Permanent A-fib Patient was in rate controlled A-fib on the day of his ER visit.  Continue Xarelto 15 mg daily for stroke prophylaxis.  Continue Toprol for rate control.  Recurrent VT S/p ICD No evidence of recurrence.  Continue Toprol and amiodarone (5 days a week).  Patient follows with the EP.  Recent ICD interrogation was normal.  CKD stage III Patient follows with nephrology.  Recent labs showed overall stable kidney function.  Disposition: Follow up in 1 month(s) with MD    Signed, Asjah Rauda David Stall, PA-C  11/06/2023 10:03 AM    [facility]

## 2023-11-06 NOTE — Patient Instructions (Signed)
Medication Instructions:  Your physician recommends that you continue on your current medications as directed. Please refer to the Current Medication list given to you today.   *If you need a refill on your cardiac medications before your next appointment, please call your pharmacy*   Lab Work: No labs ordered today    Testing/Procedures: No test ordered today    Follow-Up: At [facility], you and your health needs are our priority.  As part of our continuing mission to provide you with exceptional heart care, we have created designated Provider Care Teams.  These Care Teams include your primary Cardiologist (physician) and Advanced Practice Providers (APPs -  Physician Assistants and Nurse Practitioners) who all work together to provide you with the care you need, when you need it.  We recommend signing up for the patient portal called "MyChart".  Sign up information is provided on this After Visit Summary.  MyChart is used to connect with patients for Virtual Visits (Telemedicine).  Patients are able to view lab/test results, encounter notes, upcoming appointments, etc.  Non-urgent messages can be sent to your provider as well.   To learn more about what you can do with MyChart, go to [email or web address].    Your next appointment:   December 07, 2023 @ 10 am  Provider:   Lorine Bears, MD

## 2023-11-20 ENCOUNTER — Ambulatory Visit (INDEPENDENT_AMBULATORY_CARE_PROVIDER_SITE_OTHER): Payer: Medicare Other

## 2023-11-20 DIAGNOSIS — I255 Ischemic cardiomyopathy: Secondary | ICD-10-CM

## 2023-11-20 DIAGNOSIS — I5022 Chronic systolic (congestive) heart failure: Secondary | ICD-10-CM

## 2023-11-20 LAB — CUP PACEART REMOTE DEVICE CHECK
Battery Remaining Longevity: 54 mo
Battery Remaining Percentage: 52 %
Battery Voltage: 2.95 V
Brady Statistic RV Percent Paced: 12 %
Date Time Interrogation Session: 20241224020016
HighPow Impedance: 72 Ohm
HighPow Impedance: 72 Ohm
Implantable Lead Connection Status: 753985
Implantable Lead Implant Date: 20130403
Implantable Lead Location: 753860
Implantable Pulse Generator Implant Date: 20190422
Lead Channel Impedance Value: 360 Ohm
Lead Channel Pacing Threshold Amplitude: 1.25 V
Lead Channel Pacing Threshold Pulse Width: 0.5 ms
Lead Channel Sensing Intrinsic Amplitude: 11.7 mV
Lead Channel Setting Pacing Amplitude: 2.5 V
Lead Channel Setting Pacing Pulse Width: 0.5 ms
Lead Channel Setting Sensing Sensitivity: 0.5 mV
Pulse Gen Serial Number: 9811176
Zone Setting Status: 755011

## 2023-11-21 ENCOUNTER — Other Ambulatory Visit: Payer: Self-pay

## 2023-11-21 ENCOUNTER — Emergency Department
Admission: EM | Admit: 2023-11-21 | Discharge: 2023-11-21 | Disposition: A | Discharge status: Home / Self Care | Payer: Medicare Other | Attending: Emergency Medicine | Admitting: Emergency Medicine

## 2023-11-21 ENCOUNTER — Emergency Department: Payer: Medicare Other

## 2023-11-21 ENCOUNTER — Encounter: Payer: Self-pay | Admitting: Emergency Medicine

## 2023-11-21 DIAGNOSIS — R0602 Shortness of breath: Secondary | ICD-10-CM | POA: Diagnosis present

## 2023-11-21 DIAGNOSIS — Z20822 Contact with and (suspected) exposure to covid-19: Secondary | ICD-10-CM | POA: Insufficient documentation

## 2023-11-21 DIAGNOSIS — J205 Acute bronchitis due to respiratory syncytial virus: Secondary | ICD-10-CM | POA: Diagnosis not present

## 2023-11-21 DIAGNOSIS — R5383 Other fatigue: Secondary | ICD-10-CM | POA: Insufficient documentation

## 2023-11-21 DIAGNOSIS — I509 Heart failure, unspecified: Secondary | ICD-10-CM | POA: Insufficient documentation

## 2023-11-21 DIAGNOSIS — R7989 Other specified abnormal findings of blood chemistry: Secondary | ICD-10-CM | POA: Diagnosis not present

## 2023-11-21 LAB — CBC WITH DIFFERENTIAL/PLATELET
Abs Immature Granulocytes: 0.04 10*3/uL (ref 0.00–0.07)
Basophils Absolute: 0.1 10*3/uL (ref 0.0–0.1)
Basophils Relative: 1 %
Eosinophils Absolute: 0.1 10*3/uL (ref 0.0–0.5)
Eosinophils Relative: 1 %
HCT: 38.7 % — ABNORMAL LOW (ref 39.0–52.0)
Hemoglobin: 13.4 g/dL (ref 13.0–17.0)
Immature Granulocytes: 1 %
Lymphocytes Relative: 23 %
Lymphs Abs: 1.8 10*3/uL (ref 0.7–4.0)
MCH: 34.8 pg — ABNORMAL HIGH (ref 26.0–34.0)
MCHC: 34.6 g/dL (ref 30.0–36.0)
MCV: 100.5 fL — ABNORMAL HIGH (ref 80.0–100.0)
Monocytes Absolute: 0.8 10*3/uL (ref 0.1–1.0)
Monocytes Relative: 10 %
Neutro Abs: 5.1 10*3/uL (ref 1.7–7.7)
Neutrophils Relative %: 64 %
Platelets: 207 10*3/uL (ref 150–400)
RBC: 3.85 MIL/uL — ABNORMAL LOW (ref 4.22–5.81)
RDW: 14.4 % (ref 11.5–15.5)
WBC: 7.9 10*3/uL (ref 4.0–10.5)
nRBC: 0 % (ref 0.0–0.2)

## 2023-11-21 LAB — TROPONIN I (HIGH SENSITIVITY): Troponin I (High Sensitivity): 17 ng/L (ref <18)

## 2023-11-21 LAB — COMPREHENSIVE METABOLIC PANEL
ALT: 14 U/L (ref 0–44)
AST: 20 U/L (ref 15–41)
Albumin: 3.6 g/dL (ref 3.5–5.0)
Alkaline Phosphatase: 48 U/L (ref 38–126)
Anion gap: 10 (ref 5–15)
BUN: 32 mg/dL — ABNORMAL HIGH (ref 8–23)
CO2: 24 mmol/L (ref 22–32)
Calcium: 9 mg/dL (ref 8.9–10.3)
Chloride: 105 mmol/L (ref 98–111)
Creatinine, Ser: 1.88 mg/dL — ABNORMAL HIGH (ref 0.61–1.24)
GFR, Estimated: 35 mL/min — ABNORMAL LOW (ref >60)
Glucose, Bld: 102 mg/dL — ABNORMAL HIGH (ref 70–99)
Potassium: 4.4 mmol/L (ref 3.5–5.1)
Sodium: 139 mmol/L (ref 135–145)
Total Bilirubin: 0.9 mg/dL (ref <1.2)
Total Protein: 7.3 g/dL (ref 6.5–8.1)

## 2023-11-21 LAB — BRAIN NATRIURETIC PEPTIDE: B Natriuretic Peptide: 525.7 pg/mL — ABNORMAL HIGH (ref 0.0–100.0)

## 2023-11-21 LAB — RESP PANEL BY RT-PCR (RSV, FLU A&B, COVID)  RVPGX2
Influenza A by PCR: NEGATIVE
Influenza B by PCR: NEGATIVE
Resp Syncytial Virus by PCR: POSITIVE — AB
SARS Coronavirus 2 by RT PCR: NEGATIVE

## 2023-11-21 MED ORDER — BENZONATATE 100 MG PO CAPS: 100.0000 mg | ORAL_CAPSULE | Freq: Three times a day (TID) | ORAL | 0 refills | Status: DC | PRN

## 2023-11-21 NOTE — ED Provider Notes (Incomplete)
[facility] Provider Note    Event Date/Time   First MD Initiated Contact with Patient 11/21/23 1025     (approximate)   History   Shortness of Breath and Cough   HPI  Andres COVINGTON Sr. is a 82 y.o. male         Physical Exam   Triage Vital Signs: ED Triage Vitals  Encounter Vitals Group     BP 11/21/23 0750 135/86     Systolic BP Percentile --      Diastolic BP Percentile --      Pulse Rate 11/21/23 0750 76     Resp 11/21/23 0750 16     Temp 11/21/23 0750 98.2 F (36.8 C)     Temp Source 11/21/23 0750 Oral     SpO2 11/21/23 0750 97 %     Weight 11/21/23 0751 217 lb 2.5 oz (98.5 kg)     Height 11/21/23 0751 5\' 11"  (1.803 m)     Head Circumference --      Peak Flow --      Pain Score 11/21/23 0751 0     Pain Loc --      Pain Education --      Exclude from Growth Chart --     Most recent vital signs: Vitals:   11/21/23 0750  BP: 135/86  Pulse: 76  Resp: 16  Temp: 98.2 F (36.8 C)  SpO2: 97%    {Only need to document appropriate and relevant physical exam:1} General: Awake, no distress. *** CV:  Good peripheral perfusion. *** Resp:  Normal effort. *** Abd:  No distention. *** Other:  ***   ED Results / Procedures / Treatments   Labs (all labs ordered are listed, but only abnormal results are displayed) Labs Reviewed  RESP PANEL BY RT-PCR (RSV, FLU A&B, COVID)  RVPGX2 - Abnormal; Notable for the following components:      Result Value   Resp Syncytial Virus by PCR POSITIVE (*)    All other components within normal limits  CBC WITH DIFFERENTIAL/PLATELET - Abnormal; Notable for the following components:   RBC 3.85 (*)    HCT 38.7 (*)    MCV 100.5 (*)    MCH 34.8 (*)    All other components within normal limits  COMPREHENSIVE METABOLIC PANEL - Abnormal; Notable for the following components:   Glucose, Bld 102 (*)    BUN 32 (*)    Creatinine, Ser 1.88 (*)    GFR, Estimated 35 (*)    All other components within  normal limits  BRAIN NATRIURETIC PEPTIDE - Abnormal; Notable for the following components:   B Natriuretic Peptide 525.7 (*)    All other components within normal limits  TROPONIN I (HIGH SENSITIVITY)     EKG  ***   RADIOLOGY *** {USE THE WORD "INTERPRETED"!! You MUST document your own interpretation of imaging, as well as the fact that you reviewed the radiologist's report!:1}   PROCEDURES:  Critical Care performed: {CriticalCareYesNo:19197::"Yes, see critical care procedure note(s)","No"}  Procedures   MEDICATIONS ORDERED IN ED: Medications - No data to display   IMPRESSION / MDM / ASSESSMENT AND PLAN / ED COURSE  I reviewed the triage vital signs and the nursing notes.                              Differential diagnosis includes, but is not limited to, ***  Patient's presentation is  most consistent with {EM COPA:27473}  *** {If the patient is on the monitor, remove the brackets and asterisks on the sentence below and remember to document it as a Procedure as well. Otherwise delete the sentence below:1} {**The patient is on the cardiac monitor to evaluate for evidence of arrhythmia and/or significant heart rate changes.**} {Remember to include, when applicable, any/all of the following data: independent review of imaging independent review of labs (comment specifically on pertinent positives and negatives) review of specific prior hospitalizations, PCP/specialist notes, etc. discuss meds given and prescribed document any discussion with consultants (including hospitalists) any clinical decision tools you used and why (PECARN, NEXUS, etc.) did you consider admitting the patient? document social determinants of health affecting patient's care (homelessness, inability to follow up in a timely fashion, etc) document any pre-existing conditions increasing risk on current visit (e.g. diabetes and HTN increasing danger of high-risk chest pain/ACS) describes what meds you  gave (especially parenteral) and why any other interventions?:1}     FINAL CLINICAL IMPRESSION(S) / ED DIAGNOSES   Final diagnoses:  None     Rx / DC Orders   ED Discharge Orders     None        Note:  This document was prepared using Dragon voice recognition software and may include unintentional dictation errors.

## 2023-11-21 NOTE — ED Provider Notes (Incomplete)
[facility] Provider Note    Event Date/Time   First MD Initiated Contact with Patient 11/21/23 1025     (approximate)   History   Shortness of Breath and Cough   HPI {Remember to add pertinent medical, surgical, social, and/or OB history to HPI:1} Andres Zollie Pee Sr. is a 82 y.o. male  ***       Physical Exam   Triage Vital Signs: ED Triage Vitals  Encounter Vitals Group     BP 11/21/23 0750 135/86     Systolic BP Percentile --      Diastolic BP Percentile --      Pulse Rate 11/21/23 0750 76     Resp 11/21/23 0750 16     Temp 11/21/23 0750 98.2 F (36.8 C)     Temp Source 11/21/23 0750 Oral     SpO2 11/21/23 0750 97 %     Weight 11/21/23 0751 217 lb 2.5 oz (98.5 kg)     Height 11/21/23 0751 5\' 11"  (1.803 m)     Head Circumference --      Peak Flow --      Pain Score 11/21/23 0751 0     Pain Loc --      Pain Education --      Exclude from Growth Chart --     Most recent vital signs: Vitals:   11/21/23 0750  BP: 135/86  Pulse: 76  Resp: 16  Temp: 98.2 F (36.8 C)  SpO2: 97%    {Only need to document appropriate and relevant physical exam:1} General: Awake, no distress. *** CV:  Good peripheral perfusion. *** Resp:  Normal effort. *** Abd:  No distention. *** Other:  ***   ED Results / Procedures / Treatments   Labs (all labs ordered are listed, but only abnormal results are displayed) Labs Reviewed  RESP PANEL BY RT-PCR (RSV, FLU A&B, COVID)  RVPGX2 - Abnormal; Notable for the following components:      Result Value   Resp Syncytial Virus by PCR POSITIVE (*)    All other components within normal limits  CBC WITH DIFFERENTIAL/PLATELET - Abnormal; Notable for the following components:   RBC 3.85 (*)    HCT 38.7 (*)    MCV 100.5 (*)    MCH 34.8 (*)    All other components within normal limits  COMPREHENSIVE METABOLIC PANEL - Abnormal; Notable for the following components:   Glucose, Bld 102 (*)    BUN 32 (*)     Creatinine, Ser 1.88 (*)    GFR, Estimated 35 (*)    All other components within normal limits  BRAIN NATRIURETIC PEPTIDE - Abnormal; Notable for the following components:   B Natriuretic Peptide 525.7 (*)    All other components within normal limits  TROPONIN I (HIGH SENSITIVITY)     EKG  ***   RADIOLOGY *** {USE THE WORD "INTERPRETED"!! You MUST document your own interpretation of imaging, as well as the fact that you reviewed the radiologist's report!:1}   PROCEDURES:  Critical Care performed: {CriticalCareYesNo:19197::"Yes, see critical care procedure note(s)","No"}  Procedures   MEDICATIONS ORDERED IN ED: Medications - No data to display   IMPRESSION / MDM / ASSESSMENT AND PLAN / ED COURSE  I reviewed the triage vital signs and the nursing notes.                              Differential diagnosis  includes, but is not limited to, ***  Patient's presentation is most consistent with {EM COPA:27473}  *** {If the patient is on the monitor, remove the brackets and asterisks on the sentence below and remember to document it as a Procedure as well. Otherwise delete the sentence below:1} {**The patient is on the cardiac monitor to evaluate for evidence of arrhythmia and/or significant heart rate changes.**} {Remember to include, when applicable, any/all of the following data: independent review of imaging independent review of labs (comment specifically on pertinent positives and negatives) review of specific prior hospitalizations, PCP/specialist notes, etc. discuss meds given and prescribed document any discussion with consultants (including hospitalists) any clinical decision tools you used and why (PECARN, NEXUS, etc.) did you consider admitting the patient? document social determinants of health affecting patient's care (homelessness, inability to follow up in a timely fashion, etc) document any pre-existing conditions increasing risk on current visit (e.g.  diabetes and HTN increasing danger of high-risk chest pain/ACS) describes what meds you gave (especially parenteral) and why any other interventions?:1}     FINAL CLINICAL IMPRESSION(S) / ED DIAGNOSES   Final diagnoses:  None     Rx / DC Orders   ED Discharge Orders     None        Note:  This document was prepared using Dragon voice recognition software and may include unintentional dictation errors.

## 2023-11-21 NOTE — ED Provider Triage Note (Signed)
Emergency Medicine Provider Triage Evaluation Note  Andres Riding Sr. , a 82 y.o. male  was evaluated in triage.  Pt complains of cough, sob when lying down, hx heart problems, no swelling in legs, daughter has rsv.  Review of Systems  Positive:  Negative:   Physical Exam  BP 135/86 (BP Location: Left Arm)   Pulse 76   Temp 98.2 F (36.8 C) (Oral)   Resp 16   Ht 5\' 11"  (1.803 m)   Wt 98.5 kg   SpO2 97%   BMI 30.29 kg/m  Gen:   Awake, no distress   Resp:  Normal effort  MSK:   Moves extremities without difficulty  Other:    Medical Decision Making  Medically screening exam initiated at 7:53 AM.  Appropriate orders placed.  Andres Zollie Pee Sr. was informed that the remainder of the evaluation will be completed by another provider, this initial triage assessment does not replace that evaluation, and the importance of remaining in the ED until their evaluation is complete.     Faythe Ghee, PA-C 11/21/23 [phone]

## 2023-11-21 NOTE — ED Triage Notes (Signed)
Presents with some SOB and cough  No fever  States he has been exposed to RSV by family

## 2023-12-07 ENCOUNTER — Ambulatory Visit: Payer: Medicare Other | Attending: Cardiovascular Disease | Admitting: Cardiovascular Disease

## 2023-12-07 ENCOUNTER — Encounter: Payer: Self-pay | Admitting: Cardiovascular Disease

## 2023-12-07 VITALS — BP 120/70 | HR 61 | Ht 70.0 in | Wt 216.4 lb

## 2023-12-07 DIAGNOSIS — I25708 Atherosclerosis of coronary artery bypass graft(s), unspecified, with other forms of angina pectoris: Secondary | ICD-10-CM | POA: Diagnosis not present

## 2023-12-07 DIAGNOSIS — I5022 Chronic systolic (congestive) heart failure: Secondary | ICD-10-CM

## 2023-12-07 DIAGNOSIS — E785 Hyperlipidemia, unspecified: Secondary | ICD-10-CM

## 2023-12-07 DIAGNOSIS — I482 Chronic atrial fibrillation, unspecified: Secondary | ICD-10-CM | POA: Diagnosis not present

## 2023-12-07 MED ORDER — ENTRESTO 24-26 MG PO TABS: 1.0000 | ORAL_TABLET | Freq: Two times a day (BID) | ORAL | 3 refills | Status: AC

## 2023-12-07 MED ORDER — EPLERENONE 25 MG PO TABS: ORAL_TABLET | ORAL | 3 refills | Status: AC

## 2023-12-07 NOTE — Progress Notes (Signed)
 Cardiology Office Note   Date:  12/07/2023   ID:  Andres LITTIE Gobble Sr., DOB 1941/05/09, MRN [id number]  PCP:  [name], MD  Cardiologist:   Deatrice Cage, MD   Chief Complaint  Patient presents with   Follow-up    6 month f/u no complaints today. Meds reviewed verbally with pt.       History of Present Illness: Andres KENNETH Sr. is a 83 y.o. male who presents for a follow up regarding chronic systolic heart failure, chronic atrial fibrillation and coronary artery disease.   He had previous one-vessel CABG in 11/05/1993 after failed angioplasty on the left circumflex complicated by dissection.   Cardiac catheterization in Dec 10, 2016showed occluded SVG to OM 3. The mid left circumflex had diffuse 90% disease into a small OM3 which was also diffusely diseased and relatively small in size. EF was 15% with mildly elevated LVEDP. I recommended medical therapy. He had previous ventricular tachycardia and syncope that was successfully treated with ICD shock with subsequent adjustment of his device to deliver ATP.   He had ICD generator replacement in April 2019.  He was hospitalized in June 2021 with syncope and was found to have ventricular tachycardia treated with ATP.  CT scan showed subdural hematoma which necessitated holding Xarelto  at that time.  He had recurrent ventricular tachycardia in early 11/05/2021 thus underwent cardiac catheterization February 2022 which showed significant underlying one-vessel coronary artery disease affecting the mid to distal left circumflex into OM 3 with chronically occluded SVG to OM 3 and stable moderate mid LAD disease.  Overall, no significant change from most recent cardiac catheterization in Nov 06, 2015.  LVEDP was normal.  He was treated with amiodarone .  Jardiance was associated with dizziness.  Unfortunately, his wife died in 11/05/2022 due to breast cancer.  His daughter lives with him.  He had RSV infection in 2024/11/05.  He had musculoskeletal  chest pain at that time but he feels back to baseline now.   Past Medical History:  Diagnosis Date   Actinic keratosis    Arthritis    Atrial fibrillation, persistent-long-term    Chronic systolic congestive heart failure (HCC)    a. 03/2015 Echo: EF 30-35%, diff HK, mild MR, mod dil LA, mildly dil RA, mild TR.   Coronary artery disease    a. 1994 s/p CABG x 1 (VG->OM3);  b. 11-06-2015 Cath: LM nl, LAD min irregs, D1 nl, D2 min irregs, LCX 38m, OM2 nl, OM3 90 (small territory), VG->OM3 100, RCA min irregs, RPDA/RPL1/RPL2/RPL3 nl, EF 25%-->Med Rx.   Dysrhythmia    Hyperlipidemia    ICD (implantable cardiac defibrillator) in place    a. 02/2012 s/p SJM [id number] Fortify Assurance single lead AICD.   Mixed Ischemic and Non-ischemic Cardiomyopathy    a. 07/2012 s/p SJM AICD (RV lead only);  b. 03/2015 Echo: EF 30-35%, diff HK;  c. EF 25% by LV gram.   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 07/2012 s/ SJM AICD.    Past Surgical History:  Procedure Laterality Date   basel cell removal     left arm   CARDIAC CATHETERIZATION  02/28/2012   Minor disease in LAD and  RCA, LCX: 70% mid and occluded distally. Patent SVG to OM3   CARDIAC CATHETERIZATION N/A 11/08/2015   Procedure: Left Heart Cath and Cors/Grafts Angiography;  Surgeon: [name], MD;  Location: [facility] INVASIVE CV LAB;  Service: Cardiovascular;  Laterality: N/A;   CARDIOVERSION  03/01/2012   Procedure: CARDIOVERSION;  Surgeon: [name], MD;  Location: [facility] OR;  Service: Cardiovascular;  Laterality: N/A;   CATARACT EXTRACTION W/ INTRAOCULAR LENS  IMPLANT, BILATERAL  2011   CORONARY ARTERY BYPASS GRAFT  1994   [facility] ; CABG X1   ELECTROPHYSIOLOGY STUDY N/A 02/28/2012   Procedure: ELECTROPHYSIOLOGY STUDY;   Surgeon: [name], MD;  Location: [facility] CATH LAB;  Service: Cardiovascular;  Laterality: N/A;   ICD GENERATOR CHANGEOUT N/A 03/18/2018   Procedure: ICD GENERATOR CHANGEOUT;  Surgeon: Waddell Danelle ORN, MD;  Location: [facility] INVASIVE CV LAB;  Service: Cardiovascular;  Laterality: N/A;   INSERT / REPLACE / REMOVE PACEMAKER  02/28/12   w/defibrillator   JOINT REPLACEMENT     KNEE ARTHROSCOPY  2012   right   LEFT HEART CATH AND CORONARY ANGIOGRAPHY N/A 01/10/2021   Procedure: LEFT HEART CATH AND CORONARY ANGIOGRAPHY;  Surgeon: Cage Deatrice DELENA, MD;  Location: [facility] INVASIVE CV LAB;  Service: Cardiovascular;  Laterality: N/A;   LEFT HEART CATHETERIZATION WITH CORONARY/GRAFT ANGIOGRAM N/A 02/28/2012   Procedure: LEFT HEART CATHETERIZATION WITH EL BILE;  Surgeon: [name], MD;  Location: [facility] CATH LAB;  Service: Cardiovascular;  Laterality: N/A;   TOTAL KNEE ARTHROPLASTY  2011   left     Current Outpatient Medications  Medication Sig Dispense Refill   amiodarone  (PACERONE ) 200 MG tablet Take 1 tablet (200 mg total) five days per week. 90 tablet 3   Cinnamon  500 MG TABS Take 1,000 mg by mouth 2 (two) times daily.      Coenzyme Q10 (CO Q 10 PO) Take 300 mg by mouth daily.     Continuous Blood Gluc Sensor (DEXCOM G7 SENSOR) MISC USE 1 SENSOR EVERY 10 DAYS     eplerenone  (INSPRA ) 25 MG tablet TAKE ONE-HALF TABLET BY MOUTH  DAILY 50 tablet 2   fluticasone (FLONASE) 50 MCG/ACT nasal spray Place 1 spray into the nose as needed for allergies or rhinitis.     folic acid  (FOLVITE ) 1 MG tablet Take 1 mg by mouth daily.      furosemide  (LASIX ) 20 MG tablet Take 20 mg by mouth daily. Added by PCP.     Garlic  1000 MG CAPS Take 1 capsule by mouth daily.      Glucosamine-Chondroit-Vit C-Mn (GLUCOSAMINE CHONDR 1500 COMPLX PO) Take 1,500 mg by mouth 2 (two) times daily.      levocetirizine (XYZAL ) 5 MG tablet Take 5 mg by mouth every evening.     levothyroxine  (SYNTHROID ) 75 MCG tablet Take 75  mcg by mouth daily.     meclizine  (ANTIVERT ) 25 MG tablet Take 25 mg by mouth 3 (three) times daily as needed for dizziness.     metoprolol  succinate (TOPROL -XL) 25 MG 24 hr tablet TAKE 1 TABLET BY MOUTH AT  BEDTIME 100 tablet 2   Omega-3 Fatty Acids (FISH OIL PO) Take 1 tablet by mouth 2 (two) times daily.     rosuvastatin  (CRESTOR ) 10 MG tablet Take 10 mg by mouth daily.     sacubitril -valsartan  (ENTRESTO ) 24-26 MG TAKE 1 TABLET BY MOUTH TWICE  DAILY 180 tablet 0   traZODone  (DESYREL ) 50 MG tablet Take 1 tablet by mouth at bedtime.  TRESIBA FLEXTOUCH 200 UNIT/ML FlexTouch Pen SMARTSIG:90 Unit(s) SUB-Q Every Night     XARELTO  15 MG TABS tablet TAKE 1 TABLET BY MOUTH ONCE  DAILY WITH A MEAL 100 tablet 2   No current facility-administered medications for this visit.    Allergies:   Patient has no known allergies.    Social History:  The patient  reports that he quit smoking about 35 years ago. His smoking use included cigarettes. He started smoking about 60 years ago. He has a 25 pack-year smoking history. He has never used smokeless tobacco. He reports that he does not drink alcohol and does not use drugs.   Family History:  The patient's family history includes Heart attack in his brother and father.    ROS:  Please see the history of present illness.   Otherwise, review of systems are positive for none.   All other systems are reviewed and negative.    PHYSICAL EXAM: VS:  BP 120/70 (BP Location: Left Arm, Patient Position: Sitting, Cuff Size: Large)   Pulse 61   Ht 5' 10 (1.778 m)   Wt 216 lb 6 oz (98.1 kg)   SpO2 97%   BMI 31.05 kg/m  , BMI Body mass index is 31.05 kg/m. GEN: Well nourished, well developed, in no acute distress  HEENT: normal  Neck: no JVD, carotid bruits, or masses Cardiac: Irregularly irregular; no murmurs, rubs, or gallops,no edema  Respiratory:  clear to auscultation bilaterally, normal work of breathing GI: soft, nontender, nondistended, + BS MS: no  deformity or atrophy  Skin: warm and dry, no rash Neuro:  Strength and sensation are intact Psych: euthymic mood, full affect   EKG:  EKG is ordered today. The ekg ordered today demonstrates: Atrial fibrillation Rightward axis Non-specific intra-ventricular conduction delay Nonspecific ST abnormality   Recent Labs: 06/05/2023: TSH 3.765 11/21/2023: ALT 14; B Natriuretic Peptide 525.7; BUN 32; Creatinine, Ser 1.88; Hemoglobin 13.4; Platelets 207; Potassium 4.4; Sodium 139    Lipid Panel No results found for: CHOL, TRIG, HDL, CHOLHDL, VLDL, LDLCALC, LDLDIRECT    Wt Readings from Last 3 Encounters:  12/07/23 216 lb 6 oz (98.1 kg)  11/21/23 217 lb 2.5 oz (98.5 kg)  11/06/23 217 lb 3.2 oz (98.5 kg)      ASSESSMENT AND PLAN:  1.  Chronic systolic heart failure: Most recent ejection fraction was 25-30 %.  He appears to be euvolemic on Lasix  20 mg once daily.  Continue Toprol , Entresto  and eplerenone . He did not tolerate Jardiance in the past due to dizziness.  2. Coronary artery disease: Mainly affecting the left circumflex supplying a small territory.  Continue medical therapy.  Most recent cardiac catheterization showed stable coronary anatomy.  3. Chronic atrial fibrillation: Continue rate control with small dose Toprol .  Continue anticoagulation with Xarelto  15 mg once daily.  He does have underlying chronic kidney disease.  4. Diabetes mellitus:  Managed by endocrinology.  5. Hyperlipidemia: He is not able to tolerate higher doses of statins but is tolerating rosuvastatin  10 mg daily.  Most recent lipid profile showed an LDL of 118.  6.  Recurrent ventricular tachycardia: The patient has an ICD in place.  He is on amiodarone  for this.   Disposition:   FU with me in 6 months  Signed,  Deatrice Cage, MD  12/07/2023 10:03 AM    [facility]

## 2023-12-07 NOTE — Patient Instructions (Signed)
 Medication Instructions:  No changes *If you need a refill on your cardiac medications before your next appointment, please call your pharmacy*   Lab Work: None ordered If you have labs (blood work) drawn today and your tests are completely normal, you will receive your results only by: MyChart Message (if you have MyChart) OR A paper copy in the mail If you have any lab test that is abnormal or we need to change your treatment, we will call you to review the results.   Testing/Procedures: None ordered   Follow-Up: At [facility], you and your health needs are our priority.  As part of our continuing mission to provide you with exceptional heart care, we have created designated Provider Care Teams.  These Care Teams include your primary Cardiologist (physician) and Advanced Practice Providers (APPs -  Physician Assistants and Nurse Practitioners) who all work together to provide you with the care you need, when you need it.  We recommend signing up for the patient portal called "MyChart".  Sign up information is provided on this After Visit Summary.  MyChart is used to connect with patients for Virtual Visits (Telemedicine).  Patients are able to view lab/test results, encounter notes, upcoming appointments, etc.  Non-urgent messages can be sent to your provider as well.   To learn more about what you can do with MyChart, go to [email or web address].    Your next appointment:   6 month(s)  Provider:   You may see Lorine Bears, MD or one of the following Advanced Practice Providers on your designated Care Team:   Nicolasa Ducking, NP Eula Listen, PA-C Cadence Fransico Michael, PA-C Charlsie Quest, NP Carlos Levering, NP

## 2024-01-02 NOTE — Progress Notes (Signed)
 Remote ICD transmission.

## 2024-01-18 ENCOUNTER — Ambulatory Visit: Payer: Medicare Other | Attending: Internal Medicine | Admitting: Internal Medicine

## 2024-01-18 ENCOUNTER — Encounter: Payer: Self-pay | Admitting: Internal Medicine

## 2024-01-18 ENCOUNTER — Other Ambulatory Visit: Payer: Self-pay | Admitting: Physician Assistant

## 2024-01-18 VITALS — BP 130/76 | HR 71 | Ht 70.0 in | Wt 215.1 lb

## 2024-01-18 DIAGNOSIS — I5022 Chronic systolic (congestive) heart failure: Secondary | ICD-10-CM

## 2024-01-18 DIAGNOSIS — I482 Chronic atrial fibrillation, unspecified: Secondary | ICD-10-CM | POA: Diagnosis not present

## 2024-01-18 DIAGNOSIS — Z95810 Presence of automatic (implantable) cardiac defibrillator: Secondary | ICD-10-CM

## 2024-01-18 DIAGNOSIS — I255 Ischemic cardiomyopathy: Secondary | ICD-10-CM

## 2024-01-18 DIAGNOSIS — I25708 Atherosclerosis of coronary artery bypass graft(s), unspecified, with other forms of angina pectoris: Secondary | ICD-10-CM | POA: Diagnosis not present

## 2024-01-18 LAB — CUP PACEART INCLINIC DEVICE CHECK
Date Time Interrogation Session: 20250221163643
Implantable Lead Connection Status: 753985
Implantable Lead Implant Date: 20130403
Implantable Lead Location: 753860
Implantable Pulse Generator Implant Date: 20190422
Pulse Gen Serial Number: 9811176

## 2024-01-18 NOTE — Progress Notes (Signed)
Patient ID: Andres [name]., male   DOB: 1941-07-17, 83 y.o.   MRN: [id number]        Patient Care Team: Kandyce Rud, MD as PCP - General (Family Medicine) [name], MD as PCP - Electrophysiology (Cardiology) [name], MD as PCP - Cardiology (Cardiology)   HPI  Andres Riding Sr. is a 83 y.o. 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 bypass had been  done emergently in 1994   Underwent generator replacement 4/19 (GT)  Permanent atrial fibrillation on Xarelto without bleeding   Interval syncope assoc with rapid pace terminated VT, multiple recurrences and treated with amiodarone    Patient denies symptoms of respiratory, GI intolerance, sun sensitivity, neurological symptoms attributable to amiodarone.    Has had some "wobbliness" and has a known mild diabetic peripheral neuropathy.  The patient denies chest pai, shortness of breath, nocturnal dyspnea, orthopnea or peripheral edema.  There have been no palpitations, lightheadedness or syncope.  [employer].  Playing golf.  Went out for lunch.   His wife died 2024-04-30.  64 years.  Name Andres [name].     DATE TEST EF%   2011  Echo [facility]  14 %   12/16 [facility]  15 % SVG-OM T; LAD/RCA non obstru  8/20 Echo  30-35% LAE mod  7/21 Echo  20-25%   02/22 LHC   SVG-OM3 T; CXm-90% LAD/RCA nonobstructive  3/22 Echo  25-30%     Date Cr K TSH LFT Hgb  4/18  1.4 5.2      4/19 0.36 4.2   14.3  5/20 1.3 4.8 (2/20)   15.6  7/21 1.36 4.5   16.0  12/21 1.47 4.1 5.49 (CE)  19 14.9  5/22(CE) 1.9 4.7 9.68(4/22) 15 15.1  3/23 (CE)  1.7 4.7 4.67 17 (1/23) 14.6 (1/23)  9/23 1.64 5.1 6.08 20 13.6  6/24 (CE) 2.2 4.7 4.9  14.5   12/24 1..88 4.4  14 13.4        Thromboembolic risk factors ( age  -2, HTN-1, DM-1, Vasc disease -1, CHF-1) for a CHADSVASc  Score of >=6   Past Medical History:  Diagnosis Date   Actinic keratosis    Arthritis    Atrial fibrillation, persistent-long-term    Chronic systolic congestive heart failure (HCC)    a. 03/2015 Echo: EF 30-35%, diff HK, mild MR, mod dil LA, mildly dil RA, mild TR.   Coronary artery disease    a. 1994 s/p CABG x 1 (VG->OM3);  b. 10/2015 Cath: LM nl, LAD min irregs, D1 nl, D2 min irregs, LCX 62m, OM2 nl, OM3 90 (small territory), VG->OM3 100, RCA min irregs, RPDA/RPL1/RPL2/RPL3 nl, EF 25%-->Med Rx.   Dysrhythmia    Hyperlipidemia    ICD (implantable cardiac defibrillator) in place    a. 02/2012 s/p SJM [id number] Fortify Assurance single lead AICD.   Mixed Ischemic and Non-ischemic Cardiomyopathy    a. 07/2012 s/p SJM AICD (RV lead only);  b. 03/2015 Echo: EF 30-35%, diff HK;  c. EF 25% by LV gram.   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 07/2012 s/ SJM AICD.    Past Surgical History:  Procedure Laterality Date   basel cell removal     left arm   CARDIAC CATHETERIZATION  02/28/2012   Minor disease in LAD and RCA, LCX: 70% mid and occluded distally. Patent SVG to OM3   CARDIAC CATHETERIZATION N/A 11/08/2015   Procedure: Left Heart Cath and Cors/Grafts Angiography;  Surgeon: [name], MD;  Location: [facility] INVASIVE CV LAB;  Service: Cardiovascular;  Laterality: N/A;   CARDIOVERSION  03/01/2012   Procedure: CARDIOVERSION;  Surgeon: [name], MD;  Location[facility] OR;  Service: Cardiovascular;  Laterality: N/A;   CATARACT EXTRACTION W/ INTRAOCULAR LENS  IMPLANT, BILATERAL  2011   CORONARY ARTERY BYPASS GRAFT  1994   [facility] ; CABG X1   ELECTROPHYSIOLOGY STUDY N/A 02/28/2012   Procedure: ELECTROPHYSIOLOGY STUDY;  Surgeon: [name], MD;  Location: [facility] CATH  LAB;  Service: Cardiovascular;  Laterality: N/A;   ICD GENERATOR CHANGEOUT N/A 03/18/2018   Procedure: ICD GENERATOR CHANGEOUT;  Surgeon: [name], MD;  Location: [facility] INVASIVE CV LAB;  Service: Cardiovascular;  Laterality: N/A;   INSERT / REPLACE / REMOVE PACEMAKER  02/28/12   "w/defibrillator"   JOINT REPLACEMENT     KNEE ARTHROSCOPY  2012   right   LEFT HEART CATH AND CORONARY ANGIOGRAPHY N/A 01/10/2021   Procedure: LEFT HEART CATH AND CORONARY ANGIOGRAPHY;  Surgeon: [name], MD;  Location: [facility] INVASIVE CV LAB;  Service: Cardiovascular;  Laterality: N/A;   LEFT HEART CATHETERIZATION WITH CORONARY/GRAFT ANGIOGRAM N/A 02/28/2012   Procedure: LEFT HEART CATHETERIZATION WITH [name];  Surgeon: [name], MD;  Location: [facility] CATH LAB;  Service: Cardiovascular;  Laterality: N/A;   TOTAL KNEE ARTHROPLASTY  2011   left    Current Outpatient Medications  Medication Sig Dispense Refill   amiodarone (PACERONE) 200 MG tablet Take 1 tablet (200 mg total) five days per week. 90 tablet 3   Cinnamon 500 MG TABS Take 1,000 mg by mouth 2 (two) times daily.      Coenzyme Q10 (CO Q 10 PO) Take 300 mg by mouth daily.     Continuous Blood Gluc Sensor (DEXCOM G7 SENSOR) MISC USE 1 SENSOR EVERY 10 DAYS     eplerenone (INSPRA) 25 MG tablet TAKE ONE-HALF TABLET BY MOUTH  DAILY 45 tablet 3   fluticasone (FLONASE) 50 MCG/ACT nasal spray Place 1 spray into the nose as needed for allergies or rhinitis.     folic acid (FOLVITE) 1 MG tablet Take 1 mg by mouth daily.      furosemide (LASIX) 20 MG tablet Take 20 mg by mouth daily. Added by PCP.     Garlic 1000 MG CAPS Take 1 capsule by mouth daily.      Glucosamine-Chondroit-Vit C-Mn (GLUCOSAMINE CHONDR 1500 COMPLX PO) Take 1,500 mg by mouth 2 (two) times daily.      levocetirizine (XYZAL) 5 MG tablet Take 5 mg by mouth every evening.     levothyroxine (SYNTHROID) 75 MCG tablet Take 75 mcg by mouth daily.     meclizine (ANTIVERT) 25 MG  tablet Take 25 mg by mouth 3 (three) times daily as needed for dizziness.     metoprolol succinate (TOPROL-XL) 25 MG 24 hr tablet TAKE 1 TABLET BY MOUTH AT  BEDTIME 100 tablet 2   Omega-3 Fatty Acids (FISH OIL PO) Take 1 tablet  by mouth 2 (two) times daily.     rosuvastatin (CRESTOR) 10 MG tablet Take 10 mg by mouth daily.     sacubitril-valsartan (ENTRESTO) 24-26 MG Take 1 tablet by mouth 2 (two) times daily. 180 tablet 3   traZODone (DESYREL) 50 MG tablet Take 1 tablet by mouth at bedtime.     TRESIBA FLEXTOUCH 200 UNIT/ML FlexTouch Pen SMARTSIG:90 Unit(s) SUB-Q Every Night     XARELTO 15 MG TABS tablet TAKE 1 TABLET BY MOUTH ONCE  DAILY WITH A MEAL 100 tablet 2   No current facility-administered medications for this visit.    No Known Allergies  Physical Exam: BP 130/76 (BP Location: Left Arm, Patient Position: Sitting, Cuff Size: Normal)   Pulse 71   Ht 5\' 10"  (1.778 m)   Wt 215 lb 2 oz (97.6 kg)   SpO2 93%   BMI 30.87 kg/m  Well developed and well nourished in no acute distress HENT normal Neck supple with JVP-flat Clear Device pocket well healed; without hematoma or erythema.  There is no tethering  Regular rate and rhythm, no  gallop No  murmur Abd-soft with active BS No Clubbing cyanosis  edema Skin-warm and dry A & Oriented  Grossly normal sensory and motor function negative Romberg sign  ECG afib 71 -/12/45  Device function is normal. Programming changes none  See Paceart for details    Assessment and  Plan:  Congestive heart failure- chronic-systolic  Atrial fibrillation-permanent  Cardiomyopathy ischemic/nonischemic  Orthostatic intolerance-   High Risk Medication Surveillance  /AMIO   Ventricular tachycardia -monomorphic-rapid  ICD single chamber-St. Jude   Bradycardia  Renal insufficiency grade 3b   Treated hypothyroidism  grief   Volume status is stable.  Will continue him on low-dose Inspra and Lasix daily  With his cardiomyopathy  maintain metoprolol Entresto.  For his atrial fibrillation no bleeding.  Continue Xarelto dosed for renal function  No intercurrent ventricular tachycardia.  Will continue amiodarone at 5 days a week.

## 2024-01-18 NOTE — Telephone Encounter (Signed)
Last office visit: 06/05/23 with plan to f/u 6 months.  next office visit: 01/18/24

## 2024-01-18 NOTE — Patient Instructions (Signed)
Medication Instructions:  The current medical regimen is effective;  continue present plan and medications.  *If you need a refill on your cardiac medications before your next appointment, please call your pharmacy*   Lab Work: Have TSH drawn - take order sheet with you.   If you have labs (blood work) drawn today and your tests are completely normal, you will receive your results only by: MyChart Message (if you have MyChart) OR A paper copy in the mail If you have any lab test that is abnormal or we need to change your treatment, we will call you to review the results.   Follow-Up: At [facility], you and your health needs are our priority.  As part of our continuing mission to provide you with exceptional heart care, we have created designated Provider Care Teams.  These Care Teams include your primary Cardiologist (physician) and Advanced Practice Providers (APPs -  Physician Assistants and Nurse Practitioners) who all work together to provide you with the care you need, when you need it.  We recommend signing up for the patient portal called "MyChart".  Sign up information is provided on this After Visit Summary.  MyChart is used to connect with patients for Virtual Visits (Telemedicine).  Patients are able to view lab/test results, encounter notes, upcoming appointments, etc.  Non-urgent messages can be sent to your provider as well.   To learn more about what you can do with MyChart, go to [email or web address].    Your next appointment:   6 month(s)  Provider:   Sherryl Manges, MD

## 2024-01-22 ENCOUNTER — Ambulatory Visit: Payer: Medicare Other | Admitting: Dermatology

## 2024-01-23 ENCOUNTER — Ambulatory Visit: Payer: Medicare Other | Admitting: Dermatology

## 2024-01-29 ENCOUNTER — Ambulatory Visit: Payer: Medicare Other | Admitting: Dermatology

## 2024-02-12 ENCOUNTER — Encounter: Payer: Self-pay | Admitting: Dermatology

## 2024-02-12 ENCOUNTER — Ambulatory Visit: Payer: Medicare Other | Admitting: Dermatology

## 2024-02-12 DIAGNOSIS — M67449 Ganglion, unspecified hand: Secondary | ICD-10-CM

## 2024-02-12 DIAGNOSIS — W908XXA Exposure to other nonionizing radiation, initial encounter: Secondary | ICD-10-CM | POA: Diagnosis not present

## 2024-02-12 DIAGNOSIS — L570 Actinic keratosis: Secondary | ICD-10-CM | POA: Diagnosis not present

## 2024-02-12 DIAGNOSIS — L820 Inflamed seborrheic keratosis: Secondary | ICD-10-CM

## 2024-02-12 DIAGNOSIS — L578 Other skin changes due to chronic exposure to nonionizing radiation: Secondary | ICD-10-CM

## 2024-02-12 DIAGNOSIS — L84 Corns and callosities: Secondary | ICD-10-CM

## 2024-02-12 DIAGNOSIS — D1801 Hemangioma of skin and subcutaneous tissue: Secondary | ICD-10-CM

## 2024-02-12 DIAGNOSIS — Z1283 Encounter for screening for malignant neoplasm of skin: Secondary | ICD-10-CM | POA: Diagnosis not present

## 2024-02-12 DIAGNOSIS — D229 Melanocytic nevi, unspecified: Secondary | ICD-10-CM

## 2024-02-12 DIAGNOSIS — M67442 Ganglion, left hand: Secondary | ICD-10-CM

## 2024-02-12 DIAGNOSIS — Z86007 Personal history of in-situ neoplasm of skin: Secondary | ICD-10-CM

## 2024-02-12 DIAGNOSIS — L814 Other melanin hyperpigmentation: Secondary | ICD-10-CM

## 2024-02-12 DIAGNOSIS — D692 Other nonthrombocytopenic purpura: Secondary | ICD-10-CM

## 2024-02-12 DIAGNOSIS — Z8589 Personal history of malignant neoplasm of other organs and systems: Secondary | ICD-10-CM

## 2024-02-12 DIAGNOSIS — L821 Other seborrheic keratosis: Secondary | ICD-10-CM

## 2024-02-12 NOTE — Progress Notes (Signed)
 Follow-Up Visit   Subjective  Andres WEDIG Sr. is a 83 y.o. male who presents for the following: Skin Cancer Screening and Full Body Skin Exam, hx of SCC, hx of Aks  Patient c/o a painful growth on his left middle finger for several months will not go away.   The patient presents for Total-Body Skin Exam (TBSE) for skin cancer screening and mole check. The patient has spots, moles and lesions to be evaluated, some may be new or changing and the patient may have concern these could be cancer.  The following portions of the chart were reviewed this encounter and updated as appropriate: medications, allergies, medical history  Review of Systems:  No other skin or systemic complaints except as noted in HPI or Assessment and Plan.  Objective  Well appearing patient in no apparent distress; mood and affect are within normal limits.  A full examination was performed including scalp, head, eyes, ears, nose, lips, neck, chest, axillae, abdomen, back, buttocks, bilateral upper extremities, bilateral lower extremities, hands, feet, fingers, toes, fingernails, and toenails. All findings within normal limits unless otherwise noted below.   Relevant physical exam findings are noted in the Assessment and Plan.  scalp x 1, forehead x 1, right nose x 1, right anterior helix x 1 (4) Erythematous thin papules/macules with gritty scale.  right arm x 3, right shoulder x 1, right scapula x 1 (5) Stuck-on, waxy, tan-brown papules and plaques -- Discussed benign etiology and prognosis.   Assessment & Plan   SKIN CANCER SCREENING PERFORMED TODAY.  ACTINIC DAMAGE - Chronic condition, secondary to cumulative UV/sun exposure - diffuse scaly erythematous macules with underlying dyspigmentation - Recommend daily broad spectrum sunscreen SPF 30+ to sun-exposed areas, reapply every 2 hours as needed.  - Staying in the shade or wearing long sleeves, sun glasses (UVA+UVB protection) and wide brim hats (4-inch  brim around the entire circumference of the hat) are also recommended for sun protection.  - Call for new or changing lesions.  LENTIGINES, SEBORRHEIC KERATOSES, HEMANGIOMAS - Benign normal skin lesions - Benign-appearing - Call for any changes  MELANOCYTIC NEVI - Tan-brown and/or pink-flesh-colored symmetric macules and papules - Benign appearing on exam today - Observation - Call clinic for new or changing moles - Recommend daily use of broad spectrum spf 30+ sunscreen to sun-exposed areas.   Purpura - Chronic; persistent and recurrent.  Treatable, but not curable. Arms  - Violaceous macules and patches - Benign - Related to trauma, age, sun damage and/or use of blood thinners, chronic use of topical and/or oral steroids - Observe - Can use OTC arnica containing moisturizer such as Dermend Bruise Formula if desired - Call for worsening or other concerns   CORN/CALLUS  Left foot  See a pediatrist for treatment   DIGITAL MUCOUS CYST  Left middle finger DIP joint- inflamed cystic papule  We will refer to [facility] surgeon A digital mucous cyst also known as a myxoid cyst or pseudocyst is a ganglion cyst arising from the distal interphalangeal (DIP) joint of the finger or thumb (or, less commonly, toe). The cysts are believed to form from degeneration of connective tissue and are associated with osteoarthritic joints or injury. Although the exact etiology is unknown, it is likely that a small tear forms in a joint capsule or tendon sheath, allowing extravasation of synovial fluid into the adjacent tissue. When the fluid reacts with local tissue, it becomes more gelatinous and a cyst wall forms. With any  treatment, there is a high rate of recurrence.   Treatment options include: - Puncture / Incision & Drainage (I&D) - Intralesional steroid injection - Intralesional Sclerosant injection (Asclera/ Polidocanol) - Intralesional steroid + sclerosant - Corticosteroid tape -  Cryosurgery - Laser (CO2) - Infrared photocoagulation - Excision / Surgery  History of Squamous Cell Carcinoma in Situ of the Skin - No evidence of recurrence today - Recommend regular full body skin exams - Recommend daily broad spectrum sunscreen SPF 30+ to sun-exposed areas, reapply every 2 hours as needed.  - Call if any new or changing lesions are noted between office visits  - L upper arm  AK (ACTINIC KERATOSIS) (4) scalp x 1, forehead x 1, right nose x 1, right anterior helix x 1 (4) ACTINIC DAMAGE - chronic, secondary to cumulative UV radiation exposure/sun exposure over time - diffuse scaly erythematous macules with underlying dyspigmentation - Recommend daily broad spectrum sunscreen SPF 30+ to sun-exposed areas, reapply every 2 hours as needed.  - Recommend staying in the shade or wearing long sleeves, sun glasses (UVA+UVB protection) and wide brim hats (4-inch brim around the entire circumference of the hat). - Call for new or changing lesions.   If AK treated on the right anterior helix is not gone in 2 weeks return to the office  Destruction of lesion - scalp x 1, forehead x 1, right nose x 1, right anterior helix x 1 (4) Complexity: simple   Destruction method: cryotherapy   Informed consent: discussed and consent obtained   Timeout:  patient name, date of birth, surgical site, and procedure verified Lesion destroyed using liquid nitrogen: Yes   Region frozen until ice ball extended beyond lesion: Yes   Outcome: patient tolerated procedure well with no complications   Post-procedure details: wound care instructions given   INFLAMED SEBORRHEIC KERATOSIS (5) right arm x 3, right shoulder x 1, right scapula x 1 (5) Symptomatic, irritating, patient would like treated.  Destruction of lesion - right arm x 3, right shoulder x 1, right scapula x 1 (5) Complexity: simple   Destruction method: cryotherapy   Informed consent: discussed and consent obtained   Timeout:   patient name, date of birth, surgical site, and procedure verified Lesion destroyed using liquid nitrogen: Yes   Region frozen until ice ball extended beyond lesion: Yes   Outcome: patient tolerated procedure well with no complications   Post-procedure details: wound care instructions given   DIGITAL MUCOUS CYST OF FINGER   Related Procedures AMB referral to orthopedics  Return in about 1 year (around 02/11/2025) for TBSE, hx of SCC, hx of AKs .  IAngelique Holm, CMA, am acting as scribe for Armida Sans, MD .   Documentation: I have reviewed the above documentation for accuracy and completeness, and I agree with the above.  Armida Sans, MD

## 2024-02-12 NOTE — Patient Instructions (Addendum)

## 2024-02-19 ENCOUNTER — Ambulatory Visit (INDEPENDENT_AMBULATORY_CARE_PROVIDER_SITE_OTHER): Payer: Medicare Other

## 2024-02-19 DIAGNOSIS — I255 Ischemic cardiomyopathy: Secondary | ICD-10-CM

## 2024-02-19 LAB — CUP PACEART REMOTE DEVICE CHECK
Battery Remaining Longevity: 52 mo
Battery Remaining Percentage: 50 %
Battery Voltage: 2.95 V
Brady Statistic RV Percent Paced: 13 %
Date Time Interrogation Session: 20250325020018
HighPow Impedance: 80 Ohm
HighPow Impedance: 80 Ohm
Implantable Lead Connection Status: 753985
Implantable Lead Implant Date: 20130403
Implantable Lead Location: 753860
Implantable Pulse Generator Implant Date: 20190422
Lead Channel Impedance Value: 380 Ohm
Lead Channel Pacing Threshold Amplitude: 1 V
Lead Channel Pacing Threshold Pulse Width: 0.5 ms
Lead Channel Sensing Intrinsic Amplitude: 11.7 mV
Lead Channel Setting Pacing Amplitude: 2.5 V
Lead Channel Setting Pacing Pulse Width: 0.5 ms
Lead Channel Setting Sensing Sensitivity: 0.5 mV
Pulse Gen Serial Number: 9811176
Zone Setting Status: 755011

## 2024-04-01 NOTE — Progress Notes (Signed)
 Andres [name] is a 83 y.o. male seen in follow up. He was last seen on 12/04/2023.  Diabetes type 2 -- His Hb A1c is 7.9%. He has a DexCom G7 CGM. His DexCom was downloaded and reviewed. Over the last 2 weeks, his avg blood glucose was 163 mg/dl. He had 69% of sugars in target, 26% high, 5% very high and 0% low.  Pattern shows modest hyperglycemia after meals. However he reports he is most concerned about lows as he perceives he has a low blood glucose when sugar is <100 mg/dl. He is over treating his perceived lows. For diabetes, he is now taking Tresiba 90 units qAM and Humalog insulin  40 / 30 / 20 units before meals. He reports compliance with MDI. No acute complaints.  Diabetes complications -- His last eye exam was completed at [facility] on 09/24/2023, no signs of diabetic retinopathy. Diabetes is complicated by stage 3b/4 CKD, microalbuminuria, vascular disease, hyperlipidemia and peripheral neuropathy. He established with Dr Dominica at [facility] in 06/2023.   He follows with a number of specialists including: Dr Darron, General Cardiology Dr Fernande, Electrophysiology Dr Alm Rhyme, Dermatology Dr Diedra, primary care   Past Medical History:  Diagnosis Date  . A-fib (CMS/HHS-HCC)   . ASCVD (arteriosclerotic cardiovascular disease)   . Bronchitis   . Chronic ischemic heart disease   . Chronic systolic heart failure / HFrEF   . CKD (chronic kidney disease) stage 3, GFR 30-59 ml/min (CMS/HHS-HCC)   . Coronary atherosclerosis   . Hyperlipidemia   . Hypertension   . Hypothyroidism   . OSA on CPAP   . Osteoarthritis   . PMR (polymyalgia rheumatica) (CMS/HHS-HCC)   . Type 2 diabetes mellitus (CMS/HHS-HCC)     Outpatient Medications Marked as Taking for the 04/01/24 encounter (Office Visit) with Solum, Anna Melissa, MD  Medication Sig Dispense Refill  . AMIOdarone  (PACERONE ) 200 MG tablet Take 1 tablet by mouth once daily    . cinnamon  bark 500 mg capsule  Take 1,000 mg by mouth once daily.    . COQ10, LIPOSOMAL UBIQUINOL, ORAL Take 1 tablet by mouth once daily.     . eplerenone  (INSPRA ) 25 MG tablet     . fluticasone propionate (FLONASE) 50 mcg/actuation nasal spray Place 2 sprays into both nostrils once daily    . folic acid  (FOLVITE ) 1 MG tablet TAKE 1 TABLET BY MOUTH ONCE  DAILY 100 tablet 2  . FUROsemide  (LASIX ) 20 MG tablet TAKE 1 TABLET BY MOUTH ONCE  DAILY 100 tablet 2  . glucosamine HCl 1,500 mg Tab Take 1,500 mg by mouth 2 (two) times daily.    . insulin  DEGLUDEC (TRESIBA FLEXTOUCH U-200) pen injector (concentration 200 units/mL) Inject 90 Units subcutaneously once daily In the morning 18 mL 3  . insulin  LISPRO (HUMALOG KWIKPEN) pen injector (concentration 100 units/mL) Inject 40 units before breakfast, 30 units before lunch, 20 units before supper. 90 mL 3  . levocetirizine (XYZAL ) 5 MG tablet Take 5 mg by mouth every evening    . levothyroxine  (SYNTHROID ) 75 MCG tablet Take 1 tablet (75 mcg total) by mouth once daily TAKE 1 TABLET BY MOUTH DAILY  EXCEPT DO NOT TAKE ON SUNDAYS 86 tablet 3  . metoprolol  succinate (TOPROL -XL) 25 MG XL tablet Take 0.5 tablets (12.5 mg total) by mouth at bedtime 45 tablet 3  . omega-3 fatty acids/fish oil 340-1,000 mg capsule Take 1 capsule by mouth 2 (two) times daily. Reported on 11/25/2015     .  rosuvastatin  (CRESTOR ) 10 MG tablet Take 10 mg by mouth once daily    . sacubitriL -valsartan  (ENTRESTO ) 24-26 mg tablet Take by mouth    . traZODone  (DESYREL ) 50 MG tablet TAKE 1 TABLET BY MOUTH AT  BEDTIME 100 tablet 2  . XARELTO  15 mg tablet TAKE 1 TABLET BY MOUTH ONCE  DAILY WITH A MEAL      Physical Exam  BP 128/80   Pulse 55   Ht 172.7 cm (5' 8)   Wt 95 kg (209 lb 6 oz)   SpO2 97%   BMI 31.84 kg/m   GEN: well developed male in NAD. SKIN: A bilateral bare foot exam shows no ulcerations or calluses.  EXT: No peripheral edema PSYC: alert and oriented, good insight.   Labs Appointment on 03/27/2024   Component Date Value Ref Range Status  . Hemoglobin A1C 03/27/2024 7.9 (H)  4.2 - 5.6 % Final  . Average Blood Glucose (Calc) 03/27/2024 180  mg/dL Final  . Cholesterol, Total 03/27/2024 147  100 - 200 mg/dL Final  . Triglyceride 94/98/7974 143  35 - 199 mg/dL Final  . HDL (High Density Lipoprotein) Cho* 03/27/2024 30.0  29.0 - 71.0 mg/dL Final  . LDL Calculated 03/27/2024 88  0 - 130 mg/dL Final  . VLDL Cholesterol 03/27/2024 29  mg/dL Final  . Cholesterol/HDL Ratio 03/27/2024 4.9   Final  . Glucose 03/27/2024 126 (H)  70 - 110 mg/dL Final  . Sodium 94/98/7974 139  136 - 145 mmol/L Final  . Potassium 03/27/2024 4.8  3.6 - 5.1 mmol/L Final  . Chloride 03/27/2024 108  97 - 109 mmol/L Final  . Carbon Dioxide (CO2) 03/27/2024 23.4  22.0 - 32.0 mmol/L Final  . Calcium  03/27/2024 9.1  8.7 - 10.3 mg/dL Final  . Urea Nitrogen (BUN) 03/27/2024 38 (H)  7 - 25 mg/dL Final  . Creatinine 94/98/7974 2.0 (H)  0.7 - 1.3 mg/dL Final  . Glomerular Filtration Rate (eGFR) 03/27/2024 33 (L)  >60 mL/min/1.73sq m Final   Comment: CKD-EPI (2021) does not include patient's race in the calculation of eGFR.  Monitoring changes of plasma creatinine and eGFR over time is useful for monitoring kidney function.   Interpretive Ranges for eGFR (CKD-EPI 2021):  eGFR:       >60 mL/min/1.73 sq. m - Normal eGFR:       30-59 mL/min/1.73 sq. m - Moderately Decreased eGFR:       15-29 mL/min/1.73 sq. m  - Severely Decreased eGFR:       < 15 mL/min/1.73 sq. m  - Kidney Failure    Note: These eGFR calculations do not apply in acute situations when eGFR is changing rapidly or patients on dialysis. CKD-EPI (2021) does not include patient's race in the calculation of eGFR.  Monitoring changes of plasma creatinine and eGFR over time is useful for monitoring kidney function.   Interpretive Ranges for eGFR (CKD-EPI 2021):  eGFR:       >60 mL/min/1.73 sq. m - Normal eGFR:       30-59 mL/min/1.73 sq. m - Moderately  Decreased eGFR:       15-29 mL/min/1.73 sq. m  - Severely Decreased eGFR:       < 15 mL/min/1.73 sq. m  -                           Kidney Failure    Note: These eGFR calculations do not apply in acute situations  when eGFR is changing rapidly or patients on dialysis.  . BUN/Crea Ratio 03/27/2024 19.0  6.0 - 20.0 Final  . Anion Gap w/K 03/27/2024 12.4  6.0 - 16.0 Final  . Creatinine, Random Urine 03/27/2024 32.0 (L)  40.0 - 300.0 mg/dL Final  . Urine Albumin, Random 03/27/2024 182    mg/L Final  . Urine Albumin/Creatinine Ratio 03/27/2024 568.8 (H)  <30.0 ug/mg Final   Urine:         Spot collection              (g/mg creatinine)     Normal               < 30   Moderately          30-299         increased   Clinical             >=300 albuminuria    Assessment 1. 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 Type 2 diabetes mellitus with vascular disease (CMS/HHS-HCC)   3. DM type 2 with diabetic mixed hyperlipidemia  (CMS/HHS-HCC)   4. Hypertension associated with stage 3 chronic kidney disease due to type 2 diabetes mellitus (CMS/HHS-HCC)     Plan  - Diabetes is uncontrolled. His target Hb A1c is <7.5%. - Encouraged a low carb diet, reduced portions, reduced caloric intake, increased daily activity and and weight loss.   - Counseled him to cut out the white starches especially his morning biscuit.  - No changes made to insulins today as he already perceives sugars seem too low. We reviewed target sugars. I encouraged him to stop over treating his perceived lows.   - Continue use of DexCom G7 CGM.   - Advised about hypoglycemia, what it is, signs/symptoms and appropriate treatment. Encouraged a bedtime snack of 2 crackers with cheese or peanut butter if the bedtime blood glucose is 100 mg/dl or less.  - Continue annual eye exams. He is up to date on this. - Continue statin and ACE-I.  - Continue follow up with Nephrology as planned.  -  Anticipate follow up in 3 months, or sooner if necessary, with labs prior. Will repeat A1c before that follow up appt.

## 2024-04-04 NOTE — Progress Notes (Signed)
 Remote ICD transmission.

## 2024-05-20 ENCOUNTER — Ambulatory Visit (INDEPENDENT_AMBULATORY_CARE_PROVIDER_SITE_OTHER): Payer: Medicare Other

## 2024-05-20 DIAGNOSIS — I472 Ventricular tachycardia, unspecified: Secondary | ICD-10-CM

## 2024-05-20 LAB — CUP PACEART REMOTE DEVICE CHECK
Battery Remaining Longevity: 49 mo
Battery Remaining Percentage: 48 %
Battery Voltage: 2.93 V
Brady Statistic RV Percent Paced: 11 %
Date Time Interrogation Session: 20250624020019
HighPow Impedance: 73 Ohm
HighPow Impedance: 73 Ohm
Implantable Lead Connection Status: 753985
Implantable Lead Implant Date: 20130403
Implantable Lead Location: 753860
Implantable Pulse Generator Implant Date: 20190422
Lead Channel Impedance Value: 380 Ohm
Lead Channel Pacing Threshold Amplitude: 1 V
Lead Channel Pacing Threshold Pulse Width: 0.5 ms
Lead Channel Sensing Intrinsic Amplitude: 11.7 mV
Lead Channel Setting Pacing Amplitude: 2.5 V
Lead Channel Setting Pacing Pulse Width: 0.5 ms
Lead Channel Setting Sensing Sensitivity: 0.5 mV
Pulse Gen Serial Number: 9811176
Zone Setting Status: 755011

## 2024-06-11 ENCOUNTER — Emergency Department

## 2024-06-11 ENCOUNTER — Inpatient Hospital Stay
Admission: EM | Admit: 2024-06-11 | Discharge: 2024-06-14 | DRG: 286 | Disposition: A | Discharge status: Home / Self Care | Attending: Obstetrics and Gynecology | Admitting: Obstetrics and Gynecology

## 2024-06-11 ENCOUNTER — Other Ambulatory Visit: Payer: Self-pay

## 2024-06-11 DIAGNOSIS — Z7901 Long term (current) use of anticoagulants: Secondary | ICD-10-CM

## 2024-06-11 DIAGNOSIS — R4189 Other symptoms and signs involving cognitive functions and awareness: Secondary | ICD-10-CM | POA: Diagnosis present

## 2024-06-11 DIAGNOSIS — E1165 Type 2 diabetes mellitus with hyperglycemia: Secondary | ICD-10-CM | POA: Diagnosis present

## 2024-06-11 DIAGNOSIS — E039 Hypothyroidism, unspecified: Secondary | ICD-10-CM | POA: Diagnosis present

## 2024-06-11 DIAGNOSIS — I25810 Atherosclerosis of coronary artery bypass graft(s) without angina pectoris: Secondary | ICD-10-CM | POA: Diagnosis present

## 2024-06-11 DIAGNOSIS — Z8249 Family history of ischemic heart disease and other diseases of the circulatory system: Secondary | ICD-10-CM

## 2024-06-11 DIAGNOSIS — I493 Ventricular premature depolarization: Secondary | ICD-10-CM | POA: Diagnosis present

## 2024-06-11 DIAGNOSIS — Z95810 Presence of automatic (implantable) cardiac defibrillator: Secondary | ICD-10-CM | POA: Diagnosis present

## 2024-06-11 DIAGNOSIS — I2510 Atherosclerotic heart disease of native coronary artery without angina pectoris: Secondary | ICD-10-CM | POA: Diagnosis present

## 2024-06-11 DIAGNOSIS — Z79890 Hormone replacement therapy (postmenopausal): Secondary | ICD-10-CM

## 2024-06-11 DIAGNOSIS — Z9841 Cataract extraction status, right eye: Secondary | ICD-10-CM

## 2024-06-11 DIAGNOSIS — I5022 Chronic systolic (congestive) heart failure: Secondary | ICD-10-CM | POA: Diagnosis not present

## 2024-06-11 DIAGNOSIS — I10 Essential (primary) hypertension: Secondary | ICD-10-CM | POA: Diagnosis present

## 2024-06-11 DIAGNOSIS — E1129 Type 2 diabetes mellitus with other diabetic kidney complication: Secondary | ICD-10-CM | POA: Diagnosis present

## 2024-06-11 DIAGNOSIS — I482 Chronic atrial fibrillation, unspecified: Secondary | ICD-10-CM | POA: Diagnosis present

## 2024-06-11 DIAGNOSIS — I130 Hypertensive heart and chronic kidney disease with heart failure and stage 1 through stage 4 chronic kidney disease, or unspecified chronic kidney disease: Secondary | ICD-10-CM | POA: Diagnosis present

## 2024-06-11 DIAGNOSIS — R55 Syncope and collapse: Secondary | ICD-10-CM | POA: Diagnosis present

## 2024-06-11 DIAGNOSIS — Z79899 Other long term (current) drug therapy: Secondary | ICD-10-CM

## 2024-06-11 DIAGNOSIS — M1990 Unspecified osteoarthritis, unspecified site: Secondary | ICD-10-CM | POA: Diagnosis present

## 2024-06-11 DIAGNOSIS — Z87442 Personal history of urinary calculi: Secondary | ICD-10-CM

## 2024-06-11 DIAGNOSIS — E669 Obesity, unspecified: Secondary | ICD-10-CM | POA: Diagnosis present

## 2024-06-11 DIAGNOSIS — I472 Ventricular tachycardia, unspecified: Secondary | ICD-10-CM | POA: Diagnosis not present

## 2024-06-11 DIAGNOSIS — Z85828 Personal history of other malignant neoplasm of skin: Secondary | ICD-10-CM

## 2024-06-11 DIAGNOSIS — E1122 Type 2 diabetes mellitus with diabetic chronic kidney disease: Secondary | ICD-10-CM | POA: Diagnosis present

## 2024-06-11 DIAGNOSIS — Z96652 Presence of left artificial knee joint: Secondary | ICD-10-CM | POA: Diagnosis present

## 2024-06-11 DIAGNOSIS — N1832 Chronic kidney disease, stage 3b: Secondary | ICD-10-CM | POA: Diagnosis present

## 2024-06-11 DIAGNOSIS — Z6830 Body mass index (BMI) 30.0-30.9, adult: Secondary | ICD-10-CM

## 2024-06-11 DIAGNOSIS — I5023 Acute on chronic systolic (congestive) heart failure: Secondary | ICD-10-CM | POA: Diagnosis present

## 2024-06-11 DIAGNOSIS — I255 Ischemic cardiomyopathy: Secondary | ICD-10-CM | POA: Diagnosis present

## 2024-06-11 DIAGNOSIS — G4733 Obstructive sleep apnea (adult) (pediatric): Secondary | ICD-10-CM | POA: Diagnosis present

## 2024-06-11 DIAGNOSIS — I4821 Permanent atrial fibrillation: Secondary | ICD-10-CM | POA: Diagnosis present

## 2024-06-11 DIAGNOSIS — Z86007 Personal history of in-situ neoplasm of skin: Secondary | ICD-10-CM

## 2024-06-11 DIAGNOSIS — E785 Hyperlipidemia, unspecified: Secondary | ICD-10-CM | POA: Diagnosis present

## 2024-06-11 DIAGNOSIS — Z961 Presence of intraocular lens: Secondary | ICD-10-CM | POA: Diagnosis present

## 2024-06-11 DIAGNOSIS — Z87891 Personal history of nicotine dependence: Secondary | ICD-10-CM

## 2024-06-11 DIAGNOSIS — E66811 Obesity, class 1: Secondary | ICD-10-CM | POA: Diagnosis present

## 2024-06-11 DIAGNOSIS — I2489 Other forms of acute ischemic heart disease: Secondary | ICD-10-CM | POA: Diagnosis present

## 2024-06-11 DIAGNOSIS — I5A Non-ischemic myocardial injury (non-traumatic): Secondary | ICD-10-CM | POA: Diagnosis present

## 2024-06-11 DIAGNOSIS — R7989 Other specified abnormal findings of blood chemistry: Secondary | ICD-10-CM

## 2024-06-11 DIAGNOSIS — G4730 Sleep apnea, unspecified: Secondary | ICD-10-CM | POA: Diagnosis present

## 2024-06-11 DIAGNOSIS — E875 Hyperkalemia: Secondary | ICD-10-CM | POA: Diagnosis not present

## 2024-06-11 DIAGNOSIS — Z9842 Cataract extraction status, left eye: Secondary | ICD-10-CM

## 2024-06-11 DIAGNOSIS — Z4502 Encounter for adjustment and management of automatic implantable cardiac defibrillator: Secondary | ICD-10-CM

## 2024-06-11 DIAGNOSIS — M353 Polymyalgia rheumatica: Secondary | ICD-10-CM | POA: Diagnosis present

## 2024-06-11 LAB — CBC WITH DIFFERENTIAL/PLATELET
Abs Immature Granulocytes: 0.04 K/uL (ref 0.00–0.07)
Basophils Absolute: 0.1 K/uL (ref 0.0–0.1)
Basophils Relative: 1 %
Eosinophils Absolute: 0.2 K/uL (ref 0.0–0.5)
Eosinophils Relative: 2 %
HCT: 42 % (ref 39.0–52.0)
Hemoglobin: 14.1 g/dL (ref 13.0–17.0)
Immature Granulocytes: 0 %
Lymphocytes Relative: 34 %
Lymphs Abs: 3.1 K/uL (ref 0.7–4.0)
MCH: 32.9 pg (ref 26.0–34.0)
MCHC: 33.6 g/dL (ref 30.0–36.0)
MCV: 98.1 fL (ref 80.0–100.0)
Monocytes Absolute: 1 K/uL (ref 0.1–1.0)
Monocytes Relative: 11 %
Neutro Abs: 4.8 K/uL (ref 1.7–7.7)
Neutrophils Relative %: 52 %
Platelets: 166 K/uL (ref 150–400)
RBC: 4.28 MIL/uL (ref 4.22–5.81)
RDW: 14.7 % (ref 11.5–15.5)
WBC: 9.1 K/uL (ref 4.0–10.5)
nRBC: 0 % (ref 0.0–0.2)

## 2024-06-11 LAB — COMPREHENSIVE METABOLIC PANEL WITH GFR
ALT: 17 U/L (ref 0–44)
AST: 28 U/L (ref 15–41)
Albumin: 3.9 g/dL (ref 3.5–5.0)
Alkaline Phosphatase: 30 U/L — ABNORMAL LOW (ref 38–126)
Anion gap: 13 (ref 5–15)
BUN: 39 mg/dL — ABNORMAL HIGH (ref 8–23)
CO2: 20 mmol/L — ABNORMAL LOW (ref 22–32)
Calcium: 9.3 mg/dL (ref 8.9–10.3)
Chloride: 107 mmol/L (ref 98–111)
Creatinine, Ser: 1.9 mg/dL — ABNORMAL HIGH (ref 0.61–1.24)
GFR, Estimated: 35 mL/min — ABNORMAL LOW (ref >60)
Glucose, Bld: 68 mg/dL — ABNORMAL LOW (ref 70–99)
Potassium: 3.7 mmol/L (ref 3.5–5.1)
Sodium: 140 mmol/L (ref 135–145)
Total Bilirubin: 0.8 mg/dL (ref 0.0–1.2)
Total Protein: 7.2 g/dL (ref 6.5–8.1)

## 2024-06-11 LAB — CBG MONITORING, ED
Glucose-Capillary: 69 mg/dL — ABNORMAL LOW (ref 70–99)
Glucose-Capillary: 138 mg/dL — ABNORMAL HIGH (ref 70–99)
Glucose-Capillary: 274 mg/dL — ABNORMAL HIGH (ref 70–99)

## 2024-06-11 LAB — BRAIN NATRIURETIC PEPTIDE: B Natriuretic Peptide: 425.4 pg/mL — ABNORMAL HIGH (ref 0.0–100.0)

## 2024-06-11 LAB — TROPONIN I (HIGH SENSITIVITY)
Troponin I (High Sensitivity): 17 ng/L (ref <18)
Troponin I (High Sensitivity): 40 ng/L — ABNORMAL HIGH (ref <18)

## 2024-06-11 LAB — MAGNESIUM: Magnesium: 2.1 mg/dL (ref 1.7–2.4)

## 2024-06-11 MED ORDER — AMIODARONE LOAD VIA INFUSION
150.0000 mg | Freq: Once | INTRAVENOUS | Status: AC
  Administered 2024-06-11: 150 mg via INTRAVENOUS
  Filled 2024-06-11: qty 83.34

## 2024-06-11 MED ORDER — AMIODARONE HCL IN DEXTROSE 360-4.14 MG/200ML-% IV SOLN
30.0000 mg/h | INTRAVENOUS | Status: AC
  Administered 2024-06-11 – 2024-06-13 (×4): 30 mg/h via INTRAVENOUS
  Filled 2024-06-11 (×2): qty 200

## 2024-06-11 MED ORDER — GARLIC 1000 MG PO CAPS: 1.0000 | ORAL_CAPSULE | Freq: Every day | ORAL | Status: DC

## 2024-06-11 MED ORDER — POTASSIUM CHLORIDE CRYS ER 20 MEQ PO TBCR
40.0000 meq | EXTENDED_RELEASE_TABLET | Freq: Once | ORAL | Status: AC
  Administered 2024-06-11: 40 meq via ORAL
  Filled 2024-06-11: qty 2

## 2024-06-11 MED ORDER — CINNAMON 500 MG PO TABS: 1000.0000 mg | ORAL_TABLET | Freq: Two times a day (BID) | ORAL | Status: DC

## 2024-06-11 MED ORDER — LEVOCETIRIZINE DIHYDROCHLORIDE 5 MG PO TABS: 5.0000 mg | ORAL_TABLET | Freq: Every evening | ORAL | Status: DC

## 2024-06-11 MED ORDER — TRAZODONE HCL 50 MG PO TABS
50.0000 mg | ORAL_TABLET | Freq: Every day | ORAL | Status: DC
  Administered 2024-06-11 – 2024-06-13 (×3): 50 mg via ORAL
  Filled 2024-06-11 (×3): qty 1

## 2024-06-11 MED ORDER — METOPROLOL SUCCINATE ER 25 MG PO TB24
25.0000 mg | ORAL_TABLET | Freq: Every day | ORAL | Status: DC
  Administered 2024-06-11: 25 mg via ORAL
  Filled 2024-06-11: qty 1

## 2024-06-11 MED ORDER — ACETAMINOPHEN 325 MG PO TABS: 650.0000 mg | ORAL_TABLET | Freq: Four times a day (QID) | ORAL | Status: DC | PRN

## 2024-06-11 MED ORDER — EPLERENONE 25 MG PO TABS
12.5000 mg | ORAL_TABLET | Freq: Every day | ORAL | Status: DC
  Filled 2024-06-11: qty 1

## 2024-06-11 MED ORDER — ROSUVASTATIN CALCIUM 10 MG PO TABS
20.0000 mg | ORAL_TABLET | Freq: Every day | ORAL | Status: DC
  Administered 2024-06-11 – 2024-06-13 (×3): 20 mg via ORAL
  Filled 2024-06-11: qty 2
  Filled 2024-06-11: qty 1
  Filled 2024-06-11: qty 2

## 2024-06-11 MED ORDER — SACUBITRIL-VALSARTAN 24-26 MG PO TABS
1.0000 | ORAL_TABLET | Freq: Two times a day (BID) | ORAL | Status: DC
  Administered 2024-06-11: 1 via ORAL
  Filled 2024-06-11 (×2): qty 1

## 2024-06-11 MED ORDER — ONDANSETRON HCL 4 MG/2ML IJ SOLN: 4.0000 mg | Freq: Three times a day (TID) | INTRAMUSCULAR | Status: DC | PRN

## 2024-06-11 MED ORDER — INSULIN ASPART 100 UNIT/ML IJ SOLN
0.0000 [IU] | Freq: Every day | INTRAMUSCULAR | Status: DC
  Administered 2024-06-11: 3 [IU] via SUBCUTANEOUS
  Administered 2024-06-13: 2 [IU] via SUBCUTANEOUS
  Filled 2024-06-11 (×2): qty 1

## 2024-06-11 MED ORDER — AMIODARONE HCL IN DEXTROSE 360-4.14 MG/200ML-% IV SOLN
60.0000 mg/h | INTRAVENOUS | Status: AC
  Administered 2024-06-11 (×2): 60 mg/h via INTRAVENOUS
  Filled 2024-06-11 (×2): qty 200

## 2024-06-11 MED ORDER — FOLIC ACID 1 MG PO TABS
1.0000 mg | ORAL_TABLET | Freq: Every day | ORAL | Status: DC
  Administered 2024-06-12 – 2024-06-14 (×3): 1 mg via ORAL
  Filled 2024-06-11 (×3): qty 1

## 2024-06-11 MED ORDER — RIVAROXABAN 15 MG PO TABS
15.0000 mg | ORAL_TABLET | Freq: Every day | ORAL | Status: DC
  Filled 2024-06-11: qty 1

## 2024-06-11 MED ORDER — FUROSEMIDE 20 MG PO TABS
20.0000 mg | ORAL_TABLET | Freq: Every day | ORAL | Status: DC
Start: 2024-06-12 — End: 2024-06-13
  Administered 2024-06-12 – 2024-06-13 (×2): 20 mg via ORAL
  Filled 2024-06-11 (×2): qty 1

## 2024-06-11 MED ORDER — HYDRALAZINE HCL 20 MG/ML IJ SOLN: 5.0000 mg | INTRAMUSCULAR | Status: DC | PRN

## 2024-06-11 MED ORDER — CO Q 10 100 MG PO CAPS: 300.0000 mg | ORAL_CAPSULE | Freq: Every day | ORAL | Status: DC

## 2024-06-11 MED ORDER — DEXTROSE 50 % IV SOLN: 50.0000 mL | INTRAVENOUS | Status: DC | PRN

## 2024-06-11 MED ORDER — INSULIN ASPART 100 UNIT/ML IJ SOLN
0.0000 [IU] | Freq: Three times a day (TID) | INTRAMUSCULAR | Status: DC
  Administered 2024-06-12 (×2): 3 [IU] via SUBCUTANEOUS
  Administered 2024-06-12: 2 [IU] via SUBCUTANEOUS
  Administered 2024-06-13: 5 [IU] via SUBCUTANEOUS
  Administered 2024-06-13 – 2024-06-14 (×3): 2 [IU] via SUBCUTANEOUS
  Filled 2024-06-11 (×7): qty 1

## 2024-06-11 MED ORDER — OMEGA-3-ACID ETHYL ESTERS 1 G PO CAPS
1.0000 g | ORAL_CAPSULE | Freq: Two times a day (BID) | ORAL | Status: DC
  Administered 2024-06-11 – 2024-06-14 (×6): 1 g via ORAL
  Filled 2024-06-11 (×6): qty 1

## 2024-06-11 MED ORDER — LORATADINE 10 MG PO TABS
10.0000 mg | ORAL_TABLET | Freq: Every day | ORAL | Status: DC
  Administered 2024-06-11 – 2024-06-13 (×3): 10 mg via ORAL
  Filled 2024-06-11 (×3): qty 1

## 2024-06-11 NOTE — ED Triage Notes (Signed)
 Pt to ED via [facility] from home. Pt was out working in the garden and then came inside and went unresponsive. Daughter witnessed pt go unresponsive. Daughter reported to EMS that she witnessed him get defibrillated x2 . On EMS arrival, pt was alert and oriented x4. EMS reports he was in [place] on arrival. EMS did vagal maneuver and converted pt to Afib. Pt alert and oriented on arrival. Denies chest pain.   EMS vitals  BP 114/67 HR 80s-120s SPO2 96%

## 2024-06-11 NOTE — ED Notes (Signed)
Pt received dinner tray.

## 2024-06-11 NOTE — ED Notes (Signed)
 CCMD notified of order

## 2024-06-11 NOTE — ED Notes (Signed)
 Unable to interrogate pace maker r/t equipment not functioning properly. MD Ernest notified.

## 2024-06-11 NOTE — ED Notes (Signed)
 Patient family has approached nursing station multiple times for water for the patient. They were informed on all occasions by this nurse that until diagnostic studies are completed the patient needs to remain NPO.

## 2024-06-11 NOTE — ED Notes (Signed)
 Pt to CT

## 2024-06-11 NOTE — H&P (Incomplete)
 History and Physical    Andres [name] FMW:[id number] DOB: Jan 16, 1941 DOA: 06/11/2024  Referring MD/NP/PA:   PCP: [name], MD   Patient coming from:  The patient is coming from home.     Chief Complaint: Unresponsiveness  HPI: Andres [name] [name] is a 83 y.o. male with medical history significant of A-fib on Xarelto , V. tach (s/p of AICD), HLD, DM,CAD, sCHF with EF 20-30%, hypothyroidism, CKD-3b, PMR, kidney stone, SDH, who presents with unresponsiveness.  Per family report, pt was out working in the garden and then came inside and went unresponsive. Andres [name] witnessed pt go unresponsive. Andres [name] reported to EMS that she witnessed him get defibrillated twice. On EMS arrival, pt was alert and oriented x4. Per EMS, pt was in [place] on arrival. EMS did vagal maneuver and converted pt to Afib. EDP consulted Dr. Darliss of card. Pt was started on amiodarone  drip in ED.  While I saw patient in the ED, he is asymptomatic, denies chest pain, cough, SOB.  No nausea, vomiting, diarrhea or abdominal pain.  No symptoms of UTI.  No fever or chills.  His heart rate is in the 70s.   Data reviewed independently and ED Course: pt was found to have WBC 9.1, troponin 17 --> 40, renal function close to baseline, glucose 68 by BMP. Temperature normal, blood pressure 107/71, heart rate 78, RR 22, oxygen saturation 96% on room air.  CT of head and negative.  CT of C-spine negative for acute injury, but show degenerative disc disease.  Chest x-ray showed cardiomegaly and trace left pleural effusion.  Patient is placed in stepdown for observation with Dr. Darliss of cardiology is consulted.   EKG: I have personally reviewed.  Afebrile, QTc 488, poor R wave progression.   Review of Systems:   General: no fevers, chills, no body weight gain, fatigue HEENT: no blurry vision, hearing changes or sore throat Respiratory: no dyspnea, coughing, wheezing CV: no chest pain, no palpitations GI: no  nausea, vomiting, abdominal pain, diarrhea, constipation GU: no dysuria, burning on urination, increased urinary frequency, hematuria  Ext: no leg edema Neuro: no unilateral weakness, numbness, or tingling, no vision change or hearing loss. Had unresponsiveness Skin: no rash, no skin tear. MSK: No muscle spasm, no deformity, no limitation of range of movement in spin Heme: No easy bruising.  Travel history: No recent long distant travel.   Allergy: No Known Allergies  Past Medical History:  Diagnosis Date   Actinic keratosis    Arthritis    Atrial fibrillation, persistent-long-term    Chronic systolic congestive heart failure (HCC)    a. 03/2015 Echo: EF 30-35%, diff HK, mild MR, mod dil LA, mildly dil RA, mild TR.   Coronary artery disease    a. 1994 s/p CABG x 1 (VG->OM3);  b. 10/2015 Cath: LM nl, LAD min irregs, D1 nl, D2 min irregs, LCX 90m, OM2 nl, OM3 90 (small territory), VG->OM3 100, RCA min irregs, RPDA/RPL1/RPL2/RPL3 nl, EF 25%-->Med Rx.   Dysrhythmia    Hyperlipidemia    ICD (implantable cardiac defibrillator) in place    a. 02/2012 s/p SJM [id number] Fortify Assurance single lead AICD.   Mixed Ischemic and Non-ischemic Cardiomyopathy    a. 07/2012 s/p SJM AICD (RV lead only);  b. 03/2015 Echo: EF 30-35%, diff HK;  c. EF 25% by LV gram.   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 07/2012 s/ SJM AICD.    Past Surgical History:  Procedure Laterality Date   basel cell removal     left arm   CARDIAC CATHETERIZATION  02/28/2012   Minor disease in LAD and RCA, LCX: 70% mid and occluded distally. Patent SVG to OM3   CARDIAC CATHETERIZATION N/A 11/08/2015   Procedure: Left Heart Cath and Cors/Grafts Angiography;  Surgeon: [name], MD;   Location: [facility] INVASIVE CV LAB;  Service: Cardiovascular;  Laterality: N/A;   CARDIOVERSION  03/01/2012   Procedure: CARDIOVERSION;  Surgeon: [name], MD;  Location: [facility] OR;  Service: Cardiovascular;  Laterality: N/A;   CATARACT EXTRACTION W/ INTRAOCULAR LENS  IMPLANT, BILATERAL  2011   CORONARY ARTERY BYPASS GRAFT  1994   [facility] ; CABG X1   ELECTROPHYSIOLOGY STUDY N/A 02/28/2012   Procedure: ELECTROPHYSIOLOGY STUDY;  Surgeon: [name], MD;  Location: [facility] CATH LAB;  Service: Cardiovascular;  Laterality: N/A;   ICD GENERATOR CHANGEOUT N/A 03/18/2018   Procedure: ICD GENERATOR CHANGEOUT;  Surgeon: Waddell Danelle ORN, MD;  Location: [facility] INVASIVE CV LAB;  Service: Cardiovascular;  Laterality: N/A;   INSERT / REPLACE / REMOVE PACEMAKER  02/28/12   w/defibrillator   JOINT REPLACEMENT     KNEE ARTHROSCOPY  2012   right   LEFT HEART CATH AND CORONARY ANGIOGRAPHY N/A 01/10/2021   Procedure: LEFT HEART CATH AND CORONARY ANGIOGRAPHY;  Surgeon: Cage Deatrice DELENA, MD;  Location: [facility] INVASIVE CV LAB;  Service: Cardiovascular;  Laterality: N/A;   LEFT HEART CATHETERIZATION WITH CORONARY/GRAFT ANGIOGRAM N/A 02/28/2012   Procedure: LEFT HEART CATHETERIZATION WITH EL BILE;  Surgeon: [name], MD;  Location: [facility] CATH LAB;  Service: Cardiovascular;  Laterality: N/A;   TOTAL KNEE ARTHROPLASTY  2011   left    Social History:  reports that he quit smoking about 36 years ago. His smoking use included cigarettes. He started smoking about 61 years ago. He has a 25 pack-year smoking history. He has never used smokeless tobacco. He reports that he does not drink alcohol and does not use drugs.  Family History:  Family History  Problem Relation Age of Onset   Heart attack Father    Heart attack Brother      Prior to Admission medications   Medication Sig Start Date End Date Taking? Authorizing Provider  amiodarone  (PACERONE ) 200 MG tablet Take 1 tablet (200 mg total) five days per week.  06/05/23   Sage Elspeth JAYSON, MD  Cinnamon  500 MG TABS Take 1,000 mg by mouth 2 (two) times daily.     [provider]  Coenzyme Q10 (CO Q 10 PO) Take 300 mg by mouth daily.    [provider]  Continuous Blood Gluc Sensor (DEXCOM G7 SENSOR) MISC USE 1 SENSOR EVERY 10 DAYS 12/05/22   [provider]  eplerenone  (INSPRA ) 25 MG tablet TAKE ONE-HALF TABLET BY MOUTH  DAILY 12/07/23   Cage Deatrice DELENA, MD  fluticasone (FLONASE) 50 MCG/ACT nasal spray Place 1 spray into the nose as needed for allergies or rhinitis. 06/06/18   [provider]  folic acid  (FOLVITE ) 1 MG tablet Take 1 mg by mouth daily.     [provider]  furosemide  (LASIX ) 20 MG tablet Take 20 mg by mouth daily. Added by PCP.  [provider]  Garlic  1000 MG CAPS Take 1 capsule by mouth daily.     [provider]  Glucosamine-Chondroit-Vit C-Mn (GLUCOSAMINE CHONDR 1500 COMPLX PO) Take 1,500 mg by mouth 2 (two) times daily.     [provider]  levocetirizine (XYZAL ) 5 MG tablet Take 5 mg by mouth every evening.    [provider]  levothyroxine  (SYNTHROID ) 75 MCG tablet Take 75 mcg by mouth daily. 01/18/22   [provider]  meclizine  (ANTIVERT ) 25 MG tablet Take 25 mg by mouth 3 (three) times daily as needed for dizziness. 09/15/20   [provider]  metoprolol  succinate (TOPROL -XL) 25 MG 24 hr tablet TAKE 1 TABLET BY MOUTH AT  BEDTIME 10/08/23   Darron Deatrice LABOR, MD  Omega-3 Fatty Acids (FISH OIL PO) Take 1 tablet by mouth 2 (two) times daily.    [provider]  rosuvastatin  (CRESTOR ) 10 MG tablet Take 10 mg by mouth daily.    [provider]  sacubitril -valsartan  (ENTRESTO ) 24-26 MG Take 1 tablet by mouth 2 (two) times daily. 12/07/23   Darron Deatrice LABOR, MD  traZODone  (DESYREL ) 50 MG tablet Take 1 tablet by mouth at bedtime. 12/06/22   [provider]  TRESIBA FLEXTOUCH 200 UNIT/ML FlexTouch Pen SMARTSIG:90 Unit(s)  SUB-Q Every Night    [provider]  XARELTO  15 MG TABS tablet TAKE 1 TABLET BY MOUTH ONCE  DAILY WITH A MEAL 10/05/23   Fernande Elspeth BROCKS, MD    Physical Exam: Vitals:   06/11/24 2330 06/12/24 0000 06/12/24 0030 06/12/24 0100  BP: 119/69 119/64 106/67 106/61  Pulse: 69 (!) 59 66 (!) 57  Resp: 16 14 16 16   Temp:      TempSrc:      SpO2: 100% 100% 100% 98%  Weight:      Height:       General: Not in acute distress HEENT:       Eyes: PERRL, EOMI, no jaundice       ENT: No discharge from the ears and nose, no pharynx injection, no tonsillar enlargement.        Neck: No JVD, no bruit, no mass felt. Heme: No neck lymph node enlargement. Cardiac: S1/S2, irregularly irregular rhythm, no gallops or rubs. Respiratory: No rales, wheezing, rhonchi or rubs. GI: Soft, nondistended, nontender, no rebound pain, no organomegaly, BS present. GU: No hematuria Ext: No pitting leg edema bilaterally. 1+DP/PT pulse bilaterally. Musculoskeletal: No joint deformities, No joint redness or warmth, no limitation of ROM in spin. Skin: No rashes.  Neuro: Alert, oriented X3, cranial nerves II-XII grossly intact, moves all extremities normally. Psych: Patient is not psychotic, no suicidal or hemocidal ideation.  Labs on Admission: I have personally reviewed following labs and imaging studies  CBC: Recent Labs  Lab 06/11/24 1655  WBC 9.1  NEUTROABS 4.8  HGB 14.1  HCT 42.0  MCV 98.1  PLT 166   Basic Metabolic Panel: Recent Labs  Lab 06/11/24 1655  NA 140  K 3.7  CL 107  CO2 20*  GLUCOSE 68*  BUN 39*  CREATININE 1.90*  CALCIUM  9.3  MG 2.1   GFR: Estimated Creatinine Clearance: 34.3 mL/min (A) (by C-G formula based on SCr of 1.9 mg/dL (H)). Liver Function Tests: Recent Labs  Lab 06/11/24 1655  AST 28  ALT 17  ALKPHOS 30*  BILITOT 0.8  PROT 7.2  ALBUMIN 3.9   No results for input(s): LIPASE, AMYLASE in the last 168 hours. No results for  input(s): AMMONIA in the last  168 hours. Coagulation Profile: No results for input(s): INR, PROTIME in the last 168 hours. Cardiac Enzymes: No results for input(s): CKTOTAL, CKMB, CKMBINDEX, TROPONINI in the last 168 hours. BNP (last 3 results) No results for input(s): PROBNP in the last 8760 hours. HbA1C: No results for input(s): HGBA1C in the last 72 hours. CBG: Recent Labs  Lab 06/11/24 1652 06/11/24 1934 06/11/24 2126  GLUCAP 69* 138* 274*   Lipid Profile: No results for input(s): CHOL, HDL, LDLCALC, TRIG, CHOLHDL, LDLDIRECT in the last 72 hours. Thyroid  Function Tests: No results for input(s): TSH, T4TOTAL, FREET4, T3FREE, THYROIDAB in the last 72 hours. Anemia Panel: No results for input(s): VITAMINB12, FOLATE, FERRITIN, TIBC, IRON, RETICCTPCT in the last 72 hours. Urine analysis:    Component Value Date/Time   COLORURINE Straw 12/31/2012 0905   APPEARANCEUR Clear 12/31/2012 0905   LABSPEC 1.010 12/31/2012 0905   PHURINE 5.0 12/31/2012 0905   GLUCOSEU 50 mg/dL 97/95/7985 9094   HGBUR Negative 12/31/2012 0905   BILIRUBINUR Negative 12/31/2012 0905   KETONESUR Negative 12/31/2012 0905   PROTEINUR Negative 12/31/2012 0905   NITRITE Negative 12/31/2012 0905   LEUKOCYTESUR Negative 12/31/2012 0905   Sepsis Labs: @LABRCNTIP (procalcitonin:4,lacticidven:4) )No results found for this or any previous visit (from the past 240 hours).   Radiological Exams on Admission:   Assessment/Plan Principal Problem:   Ventricular tachycardia (HCC) Active Problems:   Unresponsiveness   Ischemic cardiomyopathy   Chronic systolic congestive heart failure (HCC)   Myocardial injury   Coronary artery disease   Hypothyroidism   Hyperlipidemia   Type II diabetes mellitus with renal manifestations (HCC)   Chronic kidney disease, stage 3b (HCC)   Obesity (BMI 30-39.9)   Atrial fibrillation, chronic (HCC)   Assessment and Plan:  Ventricular tachycardia [facility]):  ED physician consulted Dr. Darliss of cardiology, started amiodarone  drip.  Heart rate is in 70s currently.  Patient is asymptomatic.  Hemodynamically stable.  -place in SDU for obs -continue amiodarone  drip - Continue home metoprolol  25 mg daily - Check TSH level -Follow-up cardio recommendation  Unresponsiveness: Due to V. tach.  Currently patient's mental status normal.  No focal neurodeficit on physical examination. -Fall precaution - Frequent neurocheck  Ischemic cardiomyopathy and Chronic systolic congestive heart failure (HCC): Today: 02/22/2021 showed EF of 25-50%.  BNP still elevated 425, but patient does not have leg edema or JVD.  No SOB.  Does not seem to have CHF exacerbation. - Continue home Lasix , Entresto , metoprolol , eplerenone   Myocardial injury and coronary artery disease: trop 17 --> 40.  No chest pain.  Likely demand ischemia. - Metoprolol , Crestor  - Patient is on Xarelto , did not start aspirin   Hypothyroidism -Synthroid  - Follow-up TSH level  Hyperlipidemia -Crestor   Type II diabetes mellitus with renal manifestations (HCC): Recent A1c 6.8.  Well-controlled.  Patient is taking Tresiba 90 units at bedtime.  Since patient has hypoglycemia with blood sugar 68, will hold Tresiba now - SSI - As needed D50 for hypoglycemia  Chronic kidney disease, stage 3b (HCC): Close to baseline.  Recent baseline creatinine of 1.88 on 11/21/2023.  His creatinine is 1.90, BUN 39, GFR 35. - Follow-up with BMP  Obesity (BMI 30-39.9): Patient has Obesity Class I, with body weight 96.6 Kg and BMI 30.56 kg/m2.  - Encourage losing weight - Exercise and healthy diet  Chronic A-fib: -Continue Xarelto , metoprolol  -Patient is on amiodarone  drip as above  DVT ppx: on Xarelto   Code Status: Full code  Family Communication:     not done, no family member is at bed side.     Disposition Plan:  Anticipate discharge back to previous environment  Consults called:   Dr. Darliss  of card  Admission status and Level of care: Stepdown:    for obs     Dispo: The patient is from: Home              Anticipated d/c is to: Home              Anticipated d/c date is: 1 day              Patient currently is not medically stable to d/c.    Severity of Illness:  The appropriate patient status for this patient is OBSERVATION. Observation status is judged to be reasonable and necessary in order to provide the required intensity of service to ensure the patient's safety. The patient's presenting symptoms, physical exam findings, and initial radiographic and laboratory data in the context of their medical condition is felt to place them at decreased risk for further clinical deterioration. Furthermore, it is anticipated that the patient will be medically stable for discharge from the hospital within 2 midnights of admission.        Date of Service 06/12/2024    Caleb Exon [facility]   If 7PM-7AM, please contact night-coverage [email or web address] 06/12/2024, 1:06 AM

## 2024-06-11 NOTE — ED Provider Notes (Signed)
 [facility] Provider Note    Event Date/Time   First MD Initiated Contact with Patient 06/11/24 1659     (approximate)   History   AICD Problem and Loss of Consciousness   HPI  Andres [name] Sr. is a 83 y.o. male with CKD, CHF, coronary disease, hypertension, A-fib, AICD who comes in with concerns for loss of consciousness.  Patient is on amiodarone  200, Xarelto  who comes in with concern for syncopal episode.  Patient was found down on the ground by family when they heard a thud.  Patient appeared unwell and she had to do a sternal rub to wake him up got a shock of his AICD.  When EMS got there he was talking but then went into V. tach and had to get shocked again by his ICD.  Patient currently denies any symptoms and is doing well      Physical Exam   Triage Vital Signs: ED Triage Vitals  Encounter Vitals Group     BP 06/11/24 1656 (!) 102/47     Girls Systolic BP Percentile --      Girls Diastolic BP Percentile --      Boys Systolic BP Percentile --      Boys Diastolic BP Percentile --      Pulse Rate 06/11/24 1656 78     Resp 06/11/24 1656 18     Temp 06/11/24 1656 97.6 F (36.4 C)     Temp Source 06/11/24 1656 Oral     SpO2 06/11/24 1656 92 %     Weight 06/11/24 1657 213 lb (96.6 kg)     Height 06/11/24 1657 5' 10 (1.778 m)     Head Circumference --      Peak Flow --      Pain Score 06/11/24 1657 0     Pain Loc --      Pain Education --      Exclude from Growth Chart --     Most recent vital signs: Vitals:   06/11/24 1656  BP: (!) 102/47  Pulse: 78  Resp: 18  Temp: 97.6 F (36.4 C)  SpO2: 92%     General: Awake, no distress.  CV:  Good peripheral perfusion.  Resp:  Normal effort.  Abd:  No distention.  No tenderness Other:  ICD noted Moving all extremities well.  No obvious signs of trauma to the head. No chest wall tenderness ED Results / Procedures / Treatments   Labs (all labs ordered are listed, but only  abnormal results are displayed) Labs Reviewed  CBG MONITORING, ED - Abnormal; Notable for the following components:      Result Value   Glucose-Capillary 69 (*)    All other components within normal limits     EKG  My interpretation of EKG:  Atrial fibrillation with a rate of 92 without any ST elevation T wave inversions, normal intervals  RADIOLOGY I have reviewed the xray personally and interpreted unchanged cardiomegaly   PROCEDURES:  Critical Care performed: Yes, see critical care procedure note(s)  .1-3 Lead EKG Interpretation  Performed by: Ernest Ronal BRAVO, MD Authorized by: Ernest Ronal BRAVO, MD     Interpretation: normal     ECG rate:  70   ECG rate assessment: normal     Rhythm: atrial fibrillation     Ectopy: none     Conduction: normal   .Critical Care  Performed by: Ernest Ronal BRAVO, MD Authorized by: Ernest Ronal BRAVO, MD  Critical care provider statement:    Critical care time (minutes):  30   Critical care was necessary to treat or prevent imminent or life-threatening deterioration of the following conditions:  Cardiac failure   Critical care was time spent personally by me on the following activities:  Development of treatment plan with patient or surrogate, discussions with consultants, evaluation of patient's response to treatment, examination of patient, ordering and review of laboratory studies, ordering and review of radiographic studies, ordering and performing treatments and interventions, pulse oximetry, re-evaluation of patient's condition and review of old charts    MEDICATIONS ORDERED IN ED: Medications  amiodarone  (NEXTERONE ) 1.8 mg/mL load via infusion 150 mg (150 mg Intravenous Bolus from Bag 06/11/24 1831)    Followed by  amiodarone  (NEXTERONE  PREMIX) 360-4.14 MG/200ML-% (1.8 mg/mL) IV infusion (60 mg/hr Intravenous New Bag/Given 06/11/24 1845)    Followed by  amiodarone  (NEXTERONE  PREMIX) 360-4.14 MG/200ML-% (1.8 mg/mL) IV infusion (has no  administration in time range)  dextrose  50 % solution 50 mL (has no administration in time range)  ondansetron  (ZOFRAN ) injection 4 mg (has no administration in time range)  hydrALAZINE  (APRESOLINE ) injection 5 mg (has no administration in time range)  acetaminophen  (TYLENOL ) tablet 650 mg (has no administration in time range)  insulin  aspart (novoLOG ) injection 0-9 Units (has no administration in time range)  insulin  aspart (novoLOG ) injection 0-5 Units (has no administration in time range)  potassium chloride  SA (KLOR-CON  M) CR tablet 40 mEq (has no administration in time range)     IMPRESSION / MDM / ASSESSMENT AND PLAN / ED COURSE  I reviewed the triage vital signs and the nursing notes.   Patient's presentation is most consistent with acute presentation with potential threat to life or bodily function.  Patient comes in with 2 episodes of V. tach.  Unable to do formal evaluation from the [facility] given the machine is not working however EMS strip does look consistent with V. tach.  Will check labs evaluate for Electra abnormalities, AKI  CMP did show slightly low glucose patient eating some crackers and some food and will recheck.  His creatinine is stable around baseline.  CBC reassuring magnesium  is normal troponin negative  5:52 PM d/w Agbor etang-recommends IV amnio and IV amnio infusion  Will discuss with hospitalist for admission  The patient is on the cardiac monitor to evaluate for evidence of arrhythmia and/or significant heart rate changes.      FINAL CLINICAL IMPRESSION(S) / ED DIAGNOSES   Final diagnoses:  Ventricular tachycardia (HCC)  Syncope and collapse     Rx / DC Orders   ED Discharge Orders     None        Note:  This document was prepared using Dragon voice recognition software and may include unintentional dictation errors.   Ernest Ronal BRAVO, MD 06/11/24 [phone]

## 2024-06-11 NOTE — ED Notes (Signed)
 Respiratory set pt up with CPAP machine.

## 2024-06-11 NOTE — ED Notes (Signed)
 Pt assisted up to use the urinal. Back in bed with call bell within reach.

## 2024-06-12 ENCOUNTER — Observation Stay (HOSPITAL_COMMUNITY)
Admit: 2024-06-12 | Discharge: 2024-06-12 | Disposition: A | Discharge status: Home / Self Care | Attending: Cardiology | Admitting: Cardiology

## 2024-06-12 DIAGNOSIS — I2489 Other forms of acute ischemic heart disease: Secondary | ICD-10-CM | POA: Diagnosis present

## 2024-06-12 DIAGNOSIS — I255 Ischemic cardiomyopathy: Secondary | ICD-10-CM | POA: Diagnosis present

## 2024-06-12 DIAGNOSIS — I5022 Chronic systolic (congestive) heart failure: Secondary | ICD-10-CM | POA: Diagnosis not present

## 2024-06-12 DIAGNOSIS — R55 Syncope and collapse: Secondary | ICD-10-CM | POA: Diagnosis present

## 2024-06-12 DIAGNOSIS — I25810 Atherosclerosis of coronary artery bypass graft(s) without angina pectoris: Secondary | ICD-10-CM | POA: Diagnosis present

## 2024-06-12 DIAGNOSIS — I2510 Atherosclerotic heart disease of native coronary artery without angina pectoris: Secondary | ICD-10-CM | POA: Diagnosis present

## 2024-06-12 DIAGNOSIS — Z85828 Personal history of other malignant neoplasm of skin: Secondary | ICD-10-CM | POA: Diagnosis not present

## 2024-06-12 DIAGNOSIS — Z95810 Presence of automatic (implantable) cardiac defibrillator: Secondary | ICD-10-CM | POA: Diagnosis not present

## 2024-06-12 DIAGNOSIS — I4729 Other ventricular tachycardia: Secondary | ICD-10-CM | POA: Diagnosis not present

## 2024-06-12 DIAGNOSIS — I4821 Permanent atrial fibrillation: Secondary | ICD-10-CM

## 2024-06-12 DIAGNOSIS — E66811 Obesity, class 1: Secondary | ICD-10-CM | POA: Diagnosis present

## 2024-06-12 DIAGNOSIS — Z87891 Personal history of nicotine dependence: Secondary | ICD-10-CM | POA: Diagnosis not present

## 2024-06-12 DIAGNOSIS — Z79899 Other long term (current) drug therapy: Secondary | ICD-10-CM | POA: Diagnosis not present

## 2024-06-12 DIAGNOSIS — E875 Hyperkalemia: Secondary | ICD-10-CM | POA: Diagnosis not present

## 2024-06-12 DIAGNOSIS — E785 Hyperlipidemia, unspecified: Secondary | ICD-10-CM | POA: Diagnosis present

## 2024-06-12 DIAGNOSIS — Z8249 Family history of ischemic heart disease and other diseases of the circulatory system: Secondary | ICD-10-CM | POA: Diagnosis not present

## 2024-06-12 DIAGNOSIS — I5A Non-ischemic myocardial injury (non-traumatic): Secondary | ICD-10-CM | POA: Diagnosis present

## 2024-06-12 DIAGNOSIS — E039 Hypothyroidism, unspecified: Secondary | ICD-10-CM | POA: Diagnosis present

## 2024-06-12 DIAGNOSIS — E1122 Type 2 diabetes mellitus with diabetic chronic kidney disease: Secondary | ICD-10-CM | POA: Diagnosis present

## 2024-06-12 DIAGNOSIS — N1832 Chronic kidney disease, stage 3b: Secondary | ICD-10-CM | POA: Diagnosis present

## 2024-06-12 DIAGNOSIS — Z7901 Long term (current) use of anticoagulants: Secondary | ICD-10-CM | POA: Diagnosis not present

## 2024-06-12 DIAGNOSIS — M353 Polymyalgia rheumatica: Secondary | ICD-10-CM | POA: Diagnosis present

## 2024-06-12 DIAGNOSIS — I482 Chronic atrial fibrillation, unspecified: Secondary | ICD-10-CM | POA: Diagnosis present

## 2024-06-12 DIAGNOSIS — I130 Hypertensive heart and chronic kidney disease with heart failure and stage 1 through stage 4 chronic kidney disease, or unspecified chronic kidney disease: Secondary | ICD-10-CM | POA: Diagnosis present

## 2024-06-12 DIAGNOSIS — Z79890 Hormone replacement therapy (postmenopausal): Secondary | ICD-10-CM | POA: Diagnosis not present

## 2024-06-12 DIAGNOSIS — I5023 Acute on chronic systolic (congestive) heart failure: Secondary | ICD-10-CM | POA: Diagnosis not present

## 2024-06-12 DIAGNOSIS — I472 Ventricular tachycardia, unspecified: Secondary | ICD-10-CM | POA: Diagnosis present

## 2024-06-12 DIAGNOSIS — E1165 Type 2 diabetes mellitus with hyperglycemia: Secondary | ICD-10-CM | POA: Diagnosis present

## 2024-06-12 DIAGNOSIS — Z4502 Encounter for adjustment and management of automatic implantable cardiac defibrillator: Secondary | ICD-10-CM

## 2024-06-12 LAB — BASIC METABOLIC PANEL WITH GFR
Anion gap: 9 (ref 5–15)
Anion gap: 7 (ref 5–15)
BUN: 36 mg/dL — ABNORMAL HIGH (ref 8–23)
BUN: 31 mg/dL — ABNORMAL HIGH (ref 8–23)
CO2: 24 mmol/L (ref 22–32)
CO2: 25 mmol/L (ref 22–32)
Calcium: 8.9 mg/dL (ref 8.9–10.3)
Calcium: 8.9 mg/dL (ref 8.9–10.3)
Chloride: 104 mmol/L (ref 98–111)
Chloride: 105 mmol/L (ref 98–111)
Creatinine, Ser: 2.1 mg/dL — ABNORMAL HIGH (ref 0.61–1.24)
Creatinine, Ser: 1.67 mg/dL — ABNORMAL HIGH (ref 0.61–1.24)
GFR, Estimated: 31 mL/min — ABNORMAL LOW (ref >60)
GFR, Estimated: 40 mL/min — ABNORMAL LOW (ref >60)
Glucose, Bld: 236 mg/dL — ABNORMAL HIGH (ref 70–99)
Glucose, Bld: 280 mg/dL — ABNORMAL HIGH (ref 70–99)
Potassium: 5.2 mmol/L — ABNORMAL HIGH (ref 3.5–5.1)
Potassium: 4.8 mmol/L (ref 3.5–5.1)
Sodium: 137 mmol/L (ref 135–145)
Sodium: 137 mmol/L (ref 135–145)

## 2024-06-12 LAB — ECHOCARDIOGRAM COMPLETE
AR max vel: 2.18 cm2
AV Area VTI: 2.15 cm2
AV Area mean vel: 2.07 cm2
AV Mean grad: 2 mmHg
AV Peak grad: 4.6 mmHg
Ao pk vel: 1.07 m/s
Area-P 1/2: 2.16 cm2
Calc EF: 27.2 %
Height: 70 in
MV VTI: 1.63 cm2
S' Lateral: 6.2 cm
Single Plane A2C EF: 37.6 %
Single Plane A4C EF: 11.5 %
Weight: 3408 [oz_av]

## 2024-06-12 LAB — GLUCOSE, CAPILLARY
Glucose-Capillary: 224 mg/dL — ABNORMAL HIGH (ref 70–99)
Glucose-Capillary: 123 mg/dL — ABNORMAL HIGH (ref 70–99)
Glucose-Capillary: 200 mg/dL — ABNORMAL HIGH (ref 70–99)

## 2024-06-12 LAB — CBC
HCT: 42.2 % (ref 39.0–52.0)
Hemoglobin: 13.9 g/dL (ref 13.0–17.0)
MCH: 32.9 pg (ref 26.0–34.0)
MCHC: 32.9 g/dL (ref 30.0–36.0)
MCV: 100 fL (ref 80.0–100.0)
Platelets: 160 K/uL (ref 150–400)
RBC: 4.22 MIL/uL (ref 4.22–5.81)
RDW: 14.8 % (ref 11.5–15.5)
WBC: 8.9 K/uL (ref 4.0–10.5)
nRBC: 0 % (ref 0.0–0.2)

## 2024-06-12 LAB — TSH
TSH: 41.943 u[IU]/mL — ABNORMAL HIGH (ref 0.350–4.500)
TSH: 35.827 u[IU]/mL — ABNORMAL HIGH (ref 0.350–4.500)

## 2024-06-12 LAB — TROPONIN I (HIGH SENSITIVITY)
Troponin I (High Sensitivity): 84 ng/L — ABNORMAL HIGH (ref <18)
Troponin I (High Sensitivity): 68 ng/L — ABNORMAL HIGH (ref <18)
Troponin I (High Sensitivity): 59 ng/L — ABNORMAL HIGH (ref <18)
Troponin I (High Sensitivity): 52 ng/L — ABNORMAL HIGH (ref <18)

## 2024-06-12 LAB — T4, FREE: Free T4: <0.25 ng/dL — ABNORMAL LOW (ref 0.61–1.12)

## 2024-06-12 LAB — CBG MONITORING, ED
Glucose-Capillary: 153 mg/dL — ABNORMAL HIGH (ref 70–99)
Glucose-Capillary: 246 mg/dL — ABNORMAL HIGH (ref 70–99)

## 2024-06-12 MED ORDER — MEXILETINE HCL 150 MG PO CAPS
150.0000 mg | ORAL_CAPSULE | Freq: Two times a day (BID) | ORAL | Status: DC
  Administered 2024-06-12 – 2024-06-14 (×5): 150 mg via ORAL
  Filled 2024-06-12 (×5): qty 1

## 2024-06-12 MED ORDER — PERFLUTREN LIPID MICROSPHERE
1.0000 mL | INTRAVENOUS | Status: AC | PRN
  Administered 2024-06-12: 2 mL via INTRAVENOUS

## 2024-06-12 MED ORDER — LEVOTHYROXINE SODIUM 50 MCG PO TABS
75.0000 ug | ORAL_TABLET | Freq: Every day | ORAL | Status: DC
  Administered 2024-06-13 – 2024-06-14 (×2): 75 ug via ORAL
  Filled 2024-06-12 (×2): qty 1

## 2024-06-12 MED ORDER — INSULIN GLARGINE-YFGN 100 UNIT/ML ~~LOC~~ SOLN
25.0000 [IU] | Freq: Every day | SUBCUTANEOUS | Status: DC
  Administered 2024-06-12 – 2024-06-14 (×3): 25 [IU] via SUBCUTANEOUS
  Filled 2024-06-12 (×3): qty 0.25

## 2024-06-12 MED ORDER — TRAZODONE HCL 50 MG PO TABS
50.0000 mg | ORAL_TABLET | Freq: Every evening | ORAL | Status: DC | PRN
  Administered 2024-06-13: 50 mg via ORAL
  Filled 2024-06-12: qty 1

## 2024-06-12 MED ORDER — METOPROLOL SUCCINATE ER 25 MG PO TB24
25.0000 mg | ORAL_TABLET | Freq: Every day | ORAL | Status: DC
  Administered 2024-06-12 – 2024-06-14 (×3): 25 mg via ORAL
  Filled 2024-06-12 (×3): qty 1

## 2024-06-12 NOTE — Consult Note (Signed)
 Cardiology Consultation   Patient ID: Andres LITTIE Gobble Sr. MRN: [id number]; DOB: [birth year]  Admit date: 06/11/2024 Date of Consult: 06/12/2024  PCP:  [name], MD   [facility] Providers Cardiologist:  Deatrice Cage, MD  Electrophysiologist:  Elspeth Sage, MD   Physician requesting consult: Dr. Hilma Reason for consult: Syncope/VT/ICD shock  Patient Profile: Andres LITTIE Gobble Sr. is a 83 y.o. male with a hx of permanent atrial fibrillation, cardiomyopathy ejection fraction 25 to 30% in 2022, coronary artery disease history of CABG, ventricular tachycardia with ICD presenting after syncope, defibrillator shock, VT  History of Present Illness: Sr. Mcqueen reports that he has been in his usual state of health, does chores outside, was working in his garden picking up items, came inside, had episode of syncope with daughter appreciating the sound of him falling.  Remembers waking up in the ambulance.  Did not have warning prior to event.  Denies having chest pain concerning for angina leading up to event.  Reports compliance with his metoprolol , amiodarone   Was told that he had VT and after vagal maneuvers by EMS, converted to normal sinus rhythm  Download of his defibrillator confirming ventricular tachycardia not responding to ATP shock delivered  Currently asymptomatic Troponin minimally elevated nontrending 40, 84, 68, 59   Past Medical History:  Diagnosis Date   Actinic keratosis    Arthritis    Atrial fibrillation, persistent-long-term    Chronic systolic congestive heart failure (HCC)    a. 03/2015 Echo: EF 30-35%, diff HK, mild MR, mod dil LA, mildly dil RA, mild TR.   Coronary artery disease    a. 1994 s/p CABG x 1 (VG->OM3);  b. 10/2015 Cath: LM nl, LAD min irregs, D1 nl, D2 min irregs, LCX 30m, OM2 nl, OM3 90 (small territory), VG->OM3 100, RCA min irregs, RPDA/RPL1/RPL2/RPL3 nl, EF 25%-->Med Rx.   Dysrhythmia    Hyperlipidemia    ICD (implantable  cardiac defibrillator) in place    a. 02/2012 s/p SJM [id number] Fortify Assurance single lead AICD.   Mixed Ischemic and Non-ischemic Cardiomyopathy    a. 07/2012 s/p SJM AICD (RV lead only);  b. 03/2015 Echo: EF 30-35%, diff HK;  c. EF 25% by LV gram.   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 07/2012 s/ SJM AICD.    Past Surgical History:  Procedure Laterality Date   basel cell removal     left arm   CARDIAC CATHETERIZATION  02/28/2012   Minor disease in LAD and RCA, LCX: 70% mid and occluded distally. Patent SVG to OM3   CARDIAC CATHETERIZATION N/A 11/08/2015   Procedure: Left Heart Cath and Cors/Grafts Angiography;  Surgeon: [name], MD;  Location: [facility] INVASIVE CV LAB;  Service: Cardiovascular;  Laterality: N/A;   CARDIOVERSION  03/01/2012   Procedure: CARDIOVERSION;  Surgeon: [name], MD;  Location: [facility] OR;  Service: Cardiovascular;  Laterality: N/A;   CATARACT EXTRACTION W/ INTRAOCULAR LENS  IMPLANT, BILATERAL  2011   CORONARY ARTERY BYPASS GRAFT  1994   [facility] ; CABG X1   ELECTROPHYSIOLOGY STUDY N/A 02/28/2012   Procedure: ELECTROPHYSIOLOGY STUDY;  Surgeon: [name], MD;  Location: [facility] CATH LAB;  Service: Cardiovascular;  Laterality: N/A;   ICD GENERATOR CHANGEOUT N/A 03/18/2018   Procedure: ICD GENERATOR CHANGEOUT;  Surgeon: Waddell Danelle ORN, MD;  Location[facility] INVASIVE CV LAB;  Service: Cardiovascular;  Laterality: N/A;   INSERT / REPLACE / REMOVE PACEMAKER  02/28/12   w/defibrillator   JOINT REPLACEMENT     KNEE ARTHROSCOPY  2012   right   LEFT HEART CATH AND CORONARY ANGIOGRAPHY N/A 01/10/2021   Procedure: LEFT HEART CATH AND CORONARY ANGIOGRAPHY;  Surgeon: Darron Deatrice LABOR, MD;  Location: [facility] INVASIVE CV LAB;  Service: Cardiovascular;   Laterality: N/A;   LEFT HEART CATHETERIZATION WITH CORONARY/GRAFT ANGIOGRAM N/A 02/28/2012   Procedure: LEFT HEART CATHETERIZATION WITH EL BILE;  Surgeon: Deatrice LABOR Darron, MD;  Location: [facility] CATH LAB;  Service: Cardiovascular;  Laterality: N/A;   TOTAL KNEE ARTHROPLASTY  2011   left     Home Medications:  Prior to Admission medications   Medication Sig Start Date End Date Taking? Authorizing Provider  Cinnamon  500 MG TABS Take 1,000 mg by mouth 2 (two) times daily.    Yes [provider]  Coenzyme Q10 (CO Q 10 PO) Take 300 mg by mouth daily.   Yes [provider]  eplerenone  (INSPRA ) 25 MG tablet TAKE ONE-HALF TABLET BY MOUTH  DAILY 12/07/23  Yes Darron Deatrice LABOR, MD  folic acid  (FOLVITE ) 1 MG tablet Take 1 mg by mouth daily.    Yes [provider]  furosemide  (LASIX ) 20 MG tablet Take 20 mg by mouth daily. Added by PCP.   Yes [provider]  Garlic  1000 MG CAPS Take 1 capsule by mouth daily.    Yes [provider]  Glucosamine-Chondroit-Vit C-Mn (GLUCOSAMINE CHONDR 1500 COMPLX PO) Take 1,500 mg by mouth 2 (two) times daily.    Yes [provider]  levocetirizine (XYZAL ) 5 MG tablet Take 5 mg by mouth every evening.   Yes [provider]  metoprolol  succinate (TOPROL -XL) 25 MG 24 hr tablet TAKE 1 TABLET BY MOUTH AT  BEDTIME 10/08/23  Yes Arida, Deatrice LABOR, MD  Omega-3 Fatty Acids (FISH OIL PO) Take 1 tablet by mouth 2 (two) times daily.   Yes [provider]  rosuvastatin  (CRESTOR ) 20 MG tablet Take 20 mg by mouth at bedtime. 02/22/24  Yes [provider]  sacubitril -valsartan  (ENTRESTO ) 24-26 MG Take 1 tablet by mouth 2 (two) times daily. 12/07/23  Yes Darron Deatrice LABOR, MD  traZODone  (DESYREL ) 50 MG tablet Take 1 tablet by mouth at bedtime. 12/06/22  Yes [provider]  TRESIBA FLEXTOUCH 200 UNIT/ML FlexTouch Pen SMARTSIG:90 Unit(s) SUB-Q Every Night   Yes [provider]   XARELTO  15 MG TABS tablet TAKE 1 TABLET BY MOUTH ONCE  DAILY WITH A MEAL 10/05/23  Yes Fernande Elspeth BROCKS, MD  Continuous Blood Gluc Sensor (DEXCOM G7 SENSOR) MISC USE 1 SENSOR EVERY 10 DAYS 12/05/22   [provider]    Scheduled Meds:  folic acid   1 mg Oral Daily   furosemide   20 mg Oral Daily   insulin  aspart  0-5 Units Subcutaneous QHS   insulin  aspart  0-9 Units Subcutaneous TID WC   insulin  glargine-yfgn  25 Units Subcutaneous Daily   loratadine   10 mg Oral QHS   metoprolol  succinate  25 mg Oral Daily   mexiletine  150 mg Oral Q12H   omega-3 acid ethyl esters  1 g Oral BID   Rivaroxaban   15 mg Oral Q supper   rosuvastatin   20 mg Oral QHS  traZODone   50 mg Oral QHS   Continuous Infusions:  amiodarone  30 mg/hr (06/12/24 0834)   PRN Meds: acetaminophen , dextrose , hydrALAZINE , ondansetron  (ZOFRAN ) IV, traZODone   Allergies:   No Known Allergies  Social History:   Social History   Socioeconomic History   Marital status: Married    Spouse name: Not on file   Number of children: 3   Years of education: Not on file   Highest education level: Not on file  Occupational History   Not on file  Tobacco Use   Smoking status: Former    Current packs/day: 0.00    Average packs/day: 1 pack/day for 25.0 years (25.0 ttl pk-yrs)    Types: Cigarettes    Start date: 01/08/1963    Quit date: 01/09/1988    Years since quitting: 36.4   Smokeless tobacco: Never  Vaping Use   Vaping status: Never Used  Substance and Sexual Activity   Alcohol use: No   Drug use: No   Sexual activity: Not Currently  Other Topics Concern   Not on file  Social History Narrative   Not on file   Social Drivers of Health   Financial Resource Strain: Patient Declined (03/07/2024)   [facility] from [facility]   Overall Financial Resource Strain (CARDIA)    Difficulty of Paying Living Expenses: Patient declined  Food Insecurity: Patient Declined (03/07/2024)   Received from [facility]   Hunger Vital Sign    Within the past 12 months, you worried that your food would run out before you got the money to buy more.: Patient declined    Within the past 12 months, the food you bought just didn't last and you didn't have money to get more.: Patient declined  Transportation Needs: Patient Declined (03/07/2024)   Received from [facility] - Transportation    In the past 12 months, has lack of transportation kept you from medical appointments or from getting medications?: Patient declined    Lack of Transportation (Non-Medical): Patient declined  Physical Activity: Not on file  Stress: Not on file  Social Connections: Not on file  Intimate Partner Violence: Not on file    Family History:    Family History  Problem Relation Age of Onset   Heart attack Father    Heart attack Brother      ROS:  Please see the history of present illness.  Review of Systems  Constitutional: Negative.   HENT: Negative.    Respiratory: Negative.    Cardiovascular: Negative.   Gastrointestinal: Negative.   Musculoskeletal: Negative.   Neurological: Negative.   Psychiatric/Behavioral: Negative.    All other systems reviewed and are negative.   Physical Exam/Data: Vitals:   06/12/24 1000 06/12/24 1148 06/12/24 1300 06/12/24 1330  BP: (!) 106/58  (!) 121/91 114/73  Pulse: (!) 59  63 69  Resp: 17     Temp:  97.7 F (36.5 C)    TempSrc:  Oral    SpO2: 95%  96% 100%  Weight:      Height:        Intake/Output Summary (Last 24 hours) at 06/12/2024 1347 Last data filed at 06/11/2024 1939 Gross per 24 hour  Intake 29.94 ml  Output --  Net 29.94 ml      06/11/2024    4:57 PM 01/18/2024    1:19 PM 12/07/2023    9:48 AM  Last 3 Weights  Weight (lbs) 213 lb 215 lb  2 oz 216 lb 6 oz  Weight (kg) 96.616 kg 97.58 kg 98.147 kg     Body mass index is 30.56 kg/m.  General:  Well nourished, well developed, in no acute distress HEENT:  normal Neck: no JVD Vascular: No carotid bruits; Distal pulses 2+ bilaterally Cardiac:  normal S1, S2; RRR; no murmur  Lungs:  clear to auscultation bilaterally, no wheezing, rhonchi or rales  Abd: soft, nontender, no hepatomegaly  Ext: no edema Musculoskeletal:  No deformities, BUE and BLE strength normal and equal Skin: warm and dry  Neuro:  CNs 2-12 intact, no focal abnormalities noted Psych:  Normal affect   EKG:  The EKG was personally reviewed and demonstrates: Atrial fibrillation ventricular rate 92 bpm nonspecific ST abnormality V5, V6,  Telemetry:  Telemetry was personally reviewed and demonstrates: Atrial fibrillation  Relevant CV Studies: Echo pending  Laboratory Data: High Sensitivity Troponin:   Recent Labs  Lab 06/11/24 1655 06/11/24 1830 06/12/24 0204 06/12/24 0511 06/12/24 0926  TROPONINIHS 17 40* 84* 68* 59*     Chemistry Recent Labs  Lab 06/11/24 1655 06/12/24 0204  NA 140 137  K 3.7 5.2*  CL 107 104  CO2 20* 24  GLUCOSE 68* 236*  BUN 39* 36*  CREATININE 1.90* 2.10*  CALCIUM  9.3 8.9  MG 2.1  --   GFRNONAA 35* 31*  ANIONGAP 13 9    Recent Labs  Lab 06/11/24 1655  PROT 7.2  ALBUMIN 3.9  AST 28  ALT 17  ALKPHOS 30*  BILITOT 0.8   Lipids No results for input(s): CHOL, TRIG, HDL, LABVLDL, LDLCALC, CHOLHDL in the last 168 hours.  Hematology Recent Labs  Lab 06/11/24 1655 06/12/24 0359  WBC 9.1 8.9  RBC 4.28 4.22  HGB 14.1 13.9  HCT 42.0 42.2  MCV 98.1 100.0  MCH 32.9 32.9  MCHC 33.6 32.9  RDW 14.7 14.8  PLT 166 160   Thyroid   Recent Labs  Lab 06/12/24 0926 06/12/24 1146  TSH 35.827*  --   FREET4  --  <0.25*    BNP Recent Labs  Lab 06/11/24 1655  BNP 425.4*    DDimer No results for input(s): DDIMER in the last 168 hours.  Radiology/Studies:  CT HEAD WO CONTRAST Result Date: 06/11/2024 CLINICAL DATA:  Head trauma unresponsive EXAM: CT HEAD WITHOUT CONTRAST TECHNIQUE: Contiguous axial images were  obtained from the base of the skull through the vertex without intravenous contrast. RADIATION DOSE REDUCTION: This exam was performed according to the departmental dose-optimization program which includes automated exposure control, adjustment of the mA and/or kV according to patient size and/or use of iterative reconstruction technique. COMPARISON:  CT brain 06/23/2020 FINDINGS: Brain: No acute territorial infarction, hemorrhage or intracranial mass. Moderate atrophy. Stable ventricular size. Vascular: No hyperdense vessels.  Carotid vascular calcification Skull: Normal. Negative for fracture or focal lesion. Sinuses/Orbits: No acute finding. Other: None IMPRESSION: 1. No CT evidence for acute intracranial abnormality. 2. Atrophy. Electronically Signed   By: Luke Bun M.D.   On: 06/11/2024 18:23   CT Cervical Spine Wo Contrast Result Date: 06/11/2024 CLINICAL DATA:  Neck trauma fall, syncope EXAM: CT CERVICAL SPINE WITHOUT CONTRAST TECHNIQUE: Multidetector CT imaging of the cervical spine was performed without intravenous contrast. Multiplanar CT image reconstructions were also generated. RADIATION DOSE REDUCTION: This exam was performed according to the departmental dose-optimization program which includes automated exposure control, adjustment of the mA and/or kV according to patient size and/or use of iterative reconstruction technique. COMPARISON:  05/26/2020 FINDINGS:  Alignment: Degenerative straightening of the normal cervical lordosis. Skull base and vertebrae: No acute fracture. No primary bone lesion or focal pathologic process. Soft tissues and spinal canal: No prevertebral fluid or swelling. No visible canal hematoma. 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 Upper chest: Negative. Other: None. IMPRESSION: 1. No fracture or static subluxation of the cervical spine. 2. 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 Electronically Signed   By: Marolyn JONETTA Jaksch M.D.   On: 06/11/2024 18:08   DG Chest Portable 1 View Result Date: 06/11/2024 CLINICAL DATA:  cp EXAM: PORTABLE CHEST - 1 VIEW COMPARISON:  None available. FINDINGS: Lower lung volumes. Trace left pleural effusion. No focal airspace consolidation or pneumothorax. Moderate cardiomegaly. Left chest pacemaker/AICD with a single lead terminating in the right ventricle. Sternotomy wires and CABG markers. Tortuous aorta with aortic atherosclerosis. No acute fracture or destructive lesions. Multilevel thoracic osteophytosis. IMPRESSION: Unchanged cardiomegaly.  Trace left pleural effusion. Electronically Signed   By: Rogelia Myers M.D.   On: 06/11/2024 17:14     Assessment and Plan: Ventricular tachycardia/ICD shock/syncope -Notes indicating prior history of VT with shock - In the setting of cardiomyopathy, ejection fraction previously noted to be 25% - Has been started on amiodarone  infusion, on beta-blocker - Echo pending - Minimally elevated troponin, less likely primary ischemic event though ischemic workup may be warranted - Continue metoprolol  succinate 25 daily - After 24-48 hours IV amiodarone  load, would likely need oral loading - EP to determine if mexiletine needed  Coronary artery disease with history of CABG Minimally elevated troponin -Will defer to EP whether they prefer to have left heart catheterization for ischemic workup done while inpatient - Currently asymptomatic with no anginal prior to or following event  Hyperlipidemia Continue Crestor   Cardiomyopathy On Entresto  24/26 twice daily, metoprolol  succinate 25 daily, Lasix  20 daily, eplerenone  12.5 daily -Will need to research whether he is a candidate for Farxiga/Jardiance  Permanent atrial fibrillation Rate controlled with metoprolol , tolerating Xarelto  15 daily    For questions or updates, please contact [facility] Please consult [email or web address] for  contact info under    Signed, Carlette Palmatier, MD  06/12/2024 1:47 PM

## 2024-06-12 NOTE — Plan of Care (Signed)

## 2024-06-12 NOTE — Consult Note (Signed)
 ELECTROPHYSIOLOGY CONSULT NOTE    Patient ID: Andres LITTIE Gobble Sr. MRN: [id number], DOB/AGE: Mar 13, 1941 83 y.o.  Admit date: 06/11/2024 Date of Consult: 06/12/2024  Primary Physician: [name], MD Primary Cardiologist: Deatrice Cage, MD  Electrophysiologist: Dr. Fernande     Patient Profile: Andres LITTIE Gobble Sr. is a 83 y.o. male with a history of perm afib, ICM, VT s/p ICD, CAD s/p CABG, HFrEF, who is being seen today for the evaluation of VT with ICD shock at the request of Dr. Kandis.  HPI:  Andres MATO Sr. is a 83 y.o. male with PMH as above. He has history of syncope associated with VT, last episode 2022. LHC at that time showed significant 1v dz, no change from previous LHC in 2016. He has been on amiodarone  many years, on 100mg  5 days per week.   Yesterday, he was working in his garden picking (estimates he was outside about 30 minutes), and came inside. At that time, he had syncope where his daughter heard him fall. He does not remember anything else until being in the ambulance.  Yesterday before this event, he was in his usual state of health - no chest pain, chest pressure, SOB, increased edema. He takes his medicine regularly, no missed doses of metop or amiodarone . He does take his synthroid  with all AM medications.   In ER, trops slightly elevated, TSH at 45.  He was started on amiodarone  bolus + gtt, had subsequent bradycardia and amiodarone  was stopped overnight.   Currently, he feels well and requests to go home. He does not sleep well in the hospital.   Daughter lives with him.   Labs Potassium5.2* (07/17 9795) Magnesium   2.1 (07/16 1655) Creatinine, ser  2.10* (07/17 0204) PLT  160 (07/17 0359) HGB  13.9 (07/17 0359) WBC 8.9 (07/17 0359) Troponin I (High Sensitivity)59* (07/17 0926).    Past Medical History:  Diagnosis Date   Actinic keratosis    Arthritis    Atrial fibrillation, persistent-long-term    Chronic systolic congestive heart  failure (HCC)    a. 03/2015 Echo: EF 30-35%, diff HK, mild MR, mod dil LA, mildly dil RA, mild TR.   Coronary artery disease    a. 1994 s/p CABG x 1 (VG->OM3);  b. 10/2015 Cath: LM nl, LAD min irregs, D1 nl, D2 min irregs, LCX 22m, OM2 nl, OM3 90 (small territory), VG->OM3 100, RCA min irregs, RPDA/RPL1/RPL2/RPL3 nl, EF 25%-->Med Rx.   Dysrhythmia    Hyperlipidemia    ICD (implantable cardiac defibrillator) in place    a. 02/2012 s/p SJM [id number] Fort[employer] Assurance single lead AICD.   Mixed Ischemic and Non-ischemic Cardiomyopathy    a. 07/2012 s/p SJM AICD (RV lead only);  b. 03/2015 Echo: EF 30-35%, diff HK;  c. EF 25% by LV gram.   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 07/2012 s/ SJM AICD.     Surgical History:  Past Surgical History:  Procedure Laterality Date   basel cell removal     left arm   CARDIAC CATHETERIZATION  02/28/2012   Minor disease in LAD and RCA, LCX: 70% mid and occluded distally. Patent SVG to [facility]   CARDIAC CATHETERIZATION N/A  11/08/2015   Procedure: Left Heart Cath and Cors/Grafts Angiography;  Surgeon: [name], MD;  Location: [facility] INVASIVE CV LAB;  Service: Cardiovascular;  Laterality: N/A;   CARDIOVERSION  03/01/2012   Procedure: CARDIOVERSION;  Surgeon: [name], MD;  Location: [facility] OR;  Service: Cardiovascular;  Laterality: N/A;   CATARACT EXTRACTION W/ INTRAOCULAR LENS  IMPLANT, BILATERAL  2011   CORONARY ARTERY BYPASS GRAFT  1994   [facility] ; CABG X1   ELECTROPHYSIOLOGY STUDY N/A 02/28/2012   Procedure: ELECTROPHYSIOLOGY STUDY;  Surgeon: [name], MD;  Location: [facility] CATH LAB;  Service: Cardiovascular;  Laterality: N/A;   ICD GENERATOR CHANGEOUT N/A 03/18/2018   Procedure: ICD GENERATOR CHANGEOUT;  Surgeon: Waddell Danelle ORN, MD;  Location: [facility] INVASIVE CV LAB;  Service: Cardiovascular;  Laterality: N/A;   INSERT / REPLACE / REMOVE PACEMAKER  02/28/12   w/defibrillator   JOINT REPLACEMENT     KNEE ARTHROSCOPY  2012   right   LEFT HEART CATH AND CORONARY ANGIOGRAPHY N/A 01/10/2021   Procedure: LEFT HEART CATH AND CORONARY ANGIOGRAPHY;  Surgeon: Cage Deatrice DELENA, MD;  Location: [facility] INVASIVE CV LAB;  Service: Cardiovascular;  Laterality: N/A;   LEFT HEART CATHETERIZATION WITH CORONARY/GRAFT ANGIOGRAM N/A 02/28/2012   Procedure: LEFT HEART CATHETERIZATION WITH EL BILE;  Surgeon: [name], MD;  Location: [facility] CATH LAB;  Service: Cardiovascular;  Laterality: N/A;   TOTAL KNEE ARTHROPLASTY  2011   left     (Not in a hospital admission)   Inpatient Medications:   folic acid   1 mg Oral Daily   furosemide   20 mg Oral Daily   insulin  aspart  0-5 Units Subcutaneous QHS   insulin  aspart  0-9 Units Subcutaneous TID WC   insulin  glargine-yfgn  25 Units Subcutaneous Daily   loratadine   10 mg Oral QHS   metoprolol  succinate  25 mg Oral Daily   mexiletine  150 mg Oral Q12H   omega-3 acid ethyl esters  1 g Oral BID   Rivaroxaban   15 mg Oral Q supper   rosuvastatin   20 mg Oral QHS   traZODone   50 mg Oral QHS    Allergies: No Known Allergies  Family History  Problem Relation Age of Onset   Heart attack Father    Heart attack Brother      Physical Exam: Vitals:   06/12/24 0830 06/12/24 0900 06/12/24 1000 06/12/24 1148  BP: 130/82 (!) 172/154 (!) 106/58   Pulse: 65 84 (!) 59   Resp: (!) 26 (!) 21 17   Temp:    97.7 F (36.5 C)  TempSrc:    Oral  SpO2: 100% (!) 89% 95%   Weight:      Height:        GEN- NAD, A&O x 3, normal affect HEENT: Normocephalic, atraumatic Lungs- CTAB, Normal effort.  Heart- irregular regular rate and rhythm, No M/G/R.  GI- Soft, NT, ND.  Extremities- No clubbing, cyanosis, or edema   Radiology/Studies: CT HEAD WO CONTRAST Result Date:  06/11/2024 CLINICAL DATA:  Head trauma unresponsive EXAM: CT HEAD WITHOUT CONTRAST TECHNIQUE: Contiguous axial images were obtained from the base of the skull through the vertex without intravenous contrast. RADIATION DOSE REDUCTION: This exam was performed according to the departmental dose-optimization program which includes automated exposure control, adjustment of the mA and/or kV according to patient size and/or use of iterative reconstruction technique. COMPARISON:  CT brain 06/23/2020 FINDINGS: Brain: No acute territorial infarction, hemorrhage or intracranial mass. Moderate atrophy.  Stable ventricular size. Vascular: No hyperdense vessels.  Carotid vascular calcification Skull: Normal. Negative for fracture or focal lesion. Sinuses/Orbits: No acute finding. Other: None IMPRESSION: 1. No CT evidence for acute intracranial abnormality. 2. Atrophy. Electronically Signed   By: Luke Bun M.D.   On: 06/11/2024 18:23   CT Cervical Spine Wo Contrast Result Date: 06/11/2024 CLINICAL DATA:  Neck trauma fall, syncope EXAM: CT CERVICAL SPINE WITHOUT CONTRAST TECHNIQUE: Multidetector CT imaging of the cervical spine was performed without intravenous contrast. Multiplanar CT image reconstructions were also generated. RADIATION DOSE REDUCTION: This exam was performed according to the departmental dose-optimization program which includes automated exposure control, adjustment of the mA and/or kV according to patient size and/or use of iterative reconstruction technique. COMPARISON:  05/26/2020 FINDINGS: Alignment: Degenerative straightening of the normal cervical lordosis. Skull base and vertebrae: No acute fracture. No primary bone lesion or focal pathologic process. Soft tissues and spinal canal: No prevertebral fluid or swelling. No visible canal hematoma. 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 Upper chest: Negative. Other: None. IMPRESSION: 1. No fracture or  static subluxation of the cervical spine. 2. 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 Electronically Signed   By: Marolyn JONETTA Jaksch M.D.   On: 06/11/2024 18:08   DG Chest Portable 1 View Result Date: 06/11/2024 CLINICAL DATA:  cp EXAM: PORTABLE CHEST - 1 VIEW COMPARISON:  None available. FINDINGS: Lower lung volumes. Trace left pleural effusion. No focal airspace consolidation or pneumothorax. Moderate cardiomegaly. Left chest pacemaker/AICD with a single lead terminating in the right ventricle. Sternotomy wires and CABG markers. Tortuous aorta with aortic atherosclerosis. No acute fracture or destructive lesions. Multilevel thoracic osteophytosis. IMPRESSION: Unchanged cardiomegaly.  Trace left pleural effusion. Electronically Signed   By: Rogelia Myers M.D.   On: 06/11/2024 17:14   CUP PACEART REMOTE DEVICE CHECK Result Date: 05/20/2024 ICD Scheduled remote reviewed. Normal device function.  Presenting rhythm:  VS, irregular R-R, occasional VP and PVC.  HF diagnostics have been abnormal in this monitoring period. Next remote 91 days. - CS, CVRS   EKG: 05/13/2024 AFib at 60bpm, QRS 137; QTC 495  01/18/2024 - AFib at 71bpm, QRS 126; QTC 493  (personally reviewed)  TELEMETRY: afib w rates 50-70s, occasional PVCs (personally reviewed)  DEVICE HISTORY: [employer] Jude single chamber ICD, imp 2013 Gen change 2019  VF-1 episode - 7/16 at 4:12pm Episode starts with what appears to be bigeminal PVCs, then degenerates to VT with cycle length 250-233ms. ATP delivered, unsuccessfully. Device then delivers 800V shock, that converts patient to sinus   VT-2 episode 7/16 at 4:12pm Episode starts with PVC > VT with cycle length 280-310ms. ATP delivered x 3 unsuccessfully, then 650V shock > returns to sinus but appears to be slower VT   Assessment/Plan: #) VT, VF #) ICM #) HV therapy #) syncope #) CAD s/p CABG #) HFrEF  Patient presented to [facility] ER after syncopal  episode. By report, patient was in VT when EMS arrived, and rhythm broke with vagal maneuvers. EMS run sheet not available for review.  Prior to yesterday's episode he felt well and at his baseline without ischemic s/s Patient has long history of CAD with medical mgmt Will update TTE, if no changes do not favor ischemic work-up Patient had bradycardia overnight, was asymptomatic Restart IV amio at 30mg /hr Restart home 25mg  toprol  nightly Add mexiletine 150mg  BID  Consider adjusting VT-1 monitor zone, will discuss with MD   #) Hypothyroid TSH quite elevated  Mgmt per primary team Anticipate adjusting home synthroid  dose        For questions or updates, please contact [facility] Please consult [email or web address] for contact info under Cardiology/STEMI.  Signed, Rachella Basden, NP  06/12/2024 11:59 AM

## 2024-06-12 NOTE — Progress Notes (Signed)
 PROGRESS NOTE    Andres JUDICE Sr.  FMW:[id number] DOB: 08/16/1941 DOA: 06/11/2024 PCP: Andres Lame, MD  Outpatient Specialists: cardiology    Brief Narrative:   From admission h and p  Andres LITTIE Gobble Sr. is a 83 y.o. male with medical history significant of A-fib on Xarelto , V. tach (s/p of AICD), HLD, DM,CAD, sCHF with EF 20-30%, hypothyroidism, CKD-3b, PMR, kidney stone, SDH, who presents with unresponsiveness.   Per family report, pt was out working in the garden and then came inside and went unresponsive. Daughter witnessed pt go unresponsive. Daughter reported to EMS that she witnessed him get defibrillated twice. On EMS arrival, pt was alert and oriented x4. Per EMS, pt was in [place] on arrival. EMS did vagal maneuver and converted pt to Afib. EDP consulted Dr. Darliss of card. Pt was started on amiodarone  drip in ED.  While I saw patient in the ED, he is asymptomatic, denies chest pain, cough, SOB.  No nausea, vomiting, diarrhea or abdominal pain.  No symptoms of UTI.  No fever or chills.  His heart rate is in the 70s.  Assessment & Plan:   Principal Problem:   Ventricular tachycardia (HCC) Active Problems:   Unresponsiveness   Ischemic cardiomyopathy   Chronic systolic congestive heart failure (HCC)   Myocardial injury   Coronary artery disease   Hypothyroidism   Hyperlipidemia   Type II diabetes mellitus with renal manifestations (HCC)   Chronic kidney disease, stage 3b (HCC)   Obesity (BMI 30-39.9)   Automatic implantable cardioverter-defibrillator in situ   Benign essential hypertension   PMR (polymyalgia rheumatica) (HCC)   Sleep apnea   Atrial fibrillation, chronic (HCC)  # V-tach History of, has ICD. Found down at home, found to be in v-tach, treated with vagal maneuver in the field and amio here. HR currently controlled, a-fib rhythm - cardiology and EP to see - currently on amio infusion  # Syncope # Skin tear Skin tear proximal to right  elbow, bandaged. No apparent other injury. CT head/c-spine negative. Syncope 2/2 v-tach as above.  # Ischemic cardiomyopathy # HFrEF 2022 echo EF 25-30. Appears euvolemic - repeat echo pending - eplerenone  and entreso on hold 2/2 hyperkalemia - continue home lasix  - home metop on hold  # A-rib In a-fib, rate controlled currently - continue amio infusion - continue home xarelto   # Demand ischemia # CAD Trops have trended to 84, no chest pain, suspect 2/2 v-tach. Hx 1-vessel CABG in the 90s - defer decision to continue to trend to cardiology - cont home statin  # Hyperkalemia Likely 2/2 repletion yesterday on ckd 3b - hold eplerenone  and entresto  today - repeat BMP in the PM  # Hypothyroidism TSH markedly elevated. Reports compliance with home meds - repeat TFTs pending, will likely need adjustment  # T2DM Glucose labile. Elevated this morning. On tresiba 90 nightly at home - semglee  25 daily to start - SSI  # CKD 3a GFR low 30s, stable - trend    DVT prophylaxis: n/a (on therapeutic anticoagulation) Code Status: full Family Communication: son [place] updated telephonically 7/17  Level of care: Progressive Status is: Observation     Consultants:  Cardiology, EP  Procedures: None thus far  Antimicrobials:  none    Subjective: Reports feeling fine, no chest pain or palpitations or dyspnea  Objective: Vitals:   06/12/24 0630 06/12/24 0700 06/12/24 0730 06/12/24 0800  BP: 123/69 106/69 113/80   Pulse: 65 (!) 54 (!) 52   Resp:  14 14 16    Temp:    97.7 F (36.5 C)  TempSrc:    Oral  SpO2: 97% 100% 99%   Weight:      Height:        Intake/Output Summary (Last 24 hours) at 06/12/2024 0834 Last data filed at 06/11/2024 1939 Gross per 24 hour  Intake 29.94 ml  Output --  Net 29.94 ml   Filed Weights   06/11/24 1657  Weight: 96.6 kg    Examination:  General exam: Appears calm and comfortable  Respiratory system: Clear to auscultation.  Respiratory effort normal. Cardiovascular system: S1 & S2 heard, irreg irreg, soft systolic murmur Gastrointestinal system: Abdomen is nondistended, soft and nontender. Central nervous system: Alert and oriented. No focal neurological deficits. Extremities: Symmetric 5 x 5 power. Skin: dressing over right arm. Psychiatry: Judgement and insight appear normal. Mood & affect appropriate.     Data Reviewed: I have personally reviewed following labs and imaging studies  CBC: Recent Labs  Lab 06/11/24 1655 06/12/24 0359  WBC 9.1 8.9  NEUTROABS 4.8  --   HGB 14.1 13.9  HCT 42.0 42.2  MCV 98.1 100.0  PLT 166 160   Basic Metabolic Panel: Recent Labs  Lab 06/11/24 1655 06/12/24 0204  NA 140 137  K 3.7 5.2*  CL 107 104  CO2 20* 24  GLUCOSE 68* 236*  BUN 39* 36*  CREATININE 1.90* 2.10*  CALCIUM  9.3 8.9  MG 2.1  --    GFR: Estimated Creatinine Clearance: 31.1 mL/min (A) (by C-G formula based on SCr of 2.1 mg/dL (H)). Liver Function Tests: Recent Labs  Lab 06/11/24 1655  AST 28  ALT 17  ALKPHOS 30*  BILITOT 0.8  PROT 7.2  ALBUMIN 3.9   No results for input(s): LIPASE, AMYLASE in the last 168 hours. No results for input(s): AMMONIA in the last 168 hours. Coagulation Profile: No results for input(s): INR, PROTIME in the last 168 hours. Cardiac Enzymes: No results for input(s): CKTOTAL, CKMB, CKMBINDEX, TROPONINI in the last 168 hours. BNP (last 3 results) No results for input(s): PROBNP in the last 8760 hours. HbA1C: No results for input(s): HGBA1C in the last 72 hours. CBG: Recent Labs  Lab 06/11/24 1652 06/11/24 1934 06/11/24 2126 06/12/24 0817  GLUCAP 69* 138* 274* 153*   Lipid Profile: No results for input(s): CHOL, HDL, LDLCALC, TRIG, CHOLHDL, LDLDIRECT in the last 72 hours. Thyroid  Function Tests: Recent Labs    06/12/24 0204  TSH 41.943*   Anemia Panel: No results for input(s): VITAMINB12, FOLATE, FERRITIN,  TIBC, IRON, RETICCTPCT in the last 72 hours. Urine analysis:    Component Value Date/Time   COLORURINE Straw 12/31/2012 0905   APPEARANCEUR Clear 12/31/2012 0905   LABSPEC 1.010 12/31/2012 0905   PHURINE 5.0 12/31/2012 0905   GLUCOSEU 50 mg/dL 97/95/7985 9094   HGBUR Negative 12/31/2012 0905   BILIRUBINUR Negative 12/31/2012 0905   KETONESUR Negative 12/31/2012 0905   PROTEINUR Negative 12/31/2012 0905   NITRITE Negative 12/31/2012 0905   LEUKOCYTESUR Negative 12/31/2012 0905   Sepsis Labs: @LABRCNTIP (procalcitonin:4,lacticidven:4)  )No results found for this or any previous visit (from the past 240 hours).       Radiology Studies: CT HEAD WO CONTRAST Result Date: 06/11/2024 CLINICAL DATA:  Head trauma unresponsive EXAM: CT HEAD WITHOUT CONTRAST TECHNIQUE: Contiguous axial images were obtained from the base of the skull through the vertex without intravenous contrast. RADIATION DOSE REDUCTION: This exam was performed according to the departmental dose-optimization program which  includes automated exposure control, adjustment of the mA and/or kV according to patient size and/or use of iterative reconstruction technique. COMPARISON:  CT brain 06/23/2020 FINDINGS: Brain: No acute territorial infarction, hemorrhage or intracranial mass. Moderate atrophy. Stable ventricular size. Vascular: No hyperdense vessels.  Carotid vascular calcification Skull: Normal. Negative for fracture or focal lesion. Sinuses/Orbits: No acute finding. Other: None IMPRESSION: 1. No CT evidence for acute intracranial abnormality. 2. Atrophy. Electronically Signed   By: Luke Bun M.D.   On: 06/11/2024 18:23   CT Cervical Spine Wo Contrast Result Date: 06/11/2024 CLINICAL DATA:  Neck trauma fall, syncope EXAM: CT CERVICAL SPINE WITHOUT CONTRAST TECHNIQUE: Multidetector CT imaging of the cervical spine was performed without intravenous contrast. Multiplanar CT image reconstructions were also generated.  RADIATION DOSE REDUCTION: This exam was performed according to the departmental dose-optimization program which includes automated exposure control, adjustment of the mA and/or kV according to patient size and/or use of iterative reconstruction technique. COMPARISON:  05/26/2020 FINDINGS: Alignment: Degenerative straightening of the normal cervical lordosis. Skull base and vertebrae: No acute fracture. No primary bone lesion or focal pathologic process. Soft tissues and spinal canal: No prevertebral fluid or swelling. No visible canal hematoma. 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 Upper chest: Negative. Other: None. IMPRESSION: 1. No fracture or static subluxation of the cervical spine. 2. 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 Electronically Signed   By: Marolyn JONETTA Jaksch M.D.   On: 06/11/2024 18:08   DG Chest Portable 1 View Result Date: 06/11/2024 CLINICAL DATA:  cp EXAM: PORTABLE CHEST - 1 VIEW COMPARISON:  None available. FINDINGS: Lower lung volumes. Trace left pleural effusion. No focal airspace consolidation or pneumothorax. Moderate cardiomegaly. Left chest pacemaker/AICD with a single lead terminating in the right ventricle. Sternotomy wires and CABG markers. Tortuous aorta with aortic atherosclerosis. No acute fracture or destructive lesions. Multilevel thoracic osteophytosis. IMPRESSION: Unchanged cardiomegaly.  Trace left pleural effusion. Electronically Signed   By: Rogelia Myers M.D.   On: 06/11/2024 17:14        Scheduled Meds:  folic acid   1 mg Oral Daily   furosemide   20 mg Oral Daily   insulin  aspart  0-5 Units Subcutaneous QHS   insulin  aspart  0-9 Units Subcutaneous TID WC   loratadine   10 mg Oral QHS   omega-3 acid ethyl esters  1 g Oral BID   Rivaroxaban   15 mg Oral Q supper   rosuvastatin   20 mg Oral QHS   traZODone   50 mg Oral QHS   Continuous Infusions:  amiodarone   Stopped (06/12/24 0232)     LOS: 0 days     Devaughn KATHEE Ban, MD [facility]   If 7PM-7AM, please contact night-coverage [email or web address] Password [email or web address] 06/12/2024, 8:34 AM

## 2024-06-12 NOTE — Progress Notes (Signed)
 Device setting changes made      Derric Dealmeida, NP Electrophysiology 06/12/24 4:37 PM

## 2024-06-12 NOTE — H&P (View-Only) (Signed)
 Cardiology Consultation   Patient ID: Andres LITTIE Gobble Sr. MRN: [id number]; DOB: [birth year]  Admit date: 06/11/2024 Date of Consult: 06/12/2024  PCP:  [name], MD   [facility] Providers Cardiologist:  Deatrice Cage, MD  Electrophysiologist:  Elspeth Sage, MD   Physician requesting consult: Dr. Hilma Reason for consult: Syncope/VT/ICD shock  Patient Profile: Andres LITTIE Gobble Sr. is a 83 y.o. male with a hx of permanent atrial fibrillation, cardiomyopathy ejection fraction 25 to 30% in 2022, coronary artery disease history of CABG, ventricular tachycardia with ICD presenting after syncope, defibrillator shock, VT  History of Present Illness: Andres [name] reports that he has been in his usual state of health, does chores outside, was working in his garden picking up items, came inside, had episode of syncope with daughter appreciating the sound of him falling.  Remembers waking up in the ambulance.  Did not have warning prior to event.  Denies having chest pain concerning for angina leading up to event.  Reports compliance with his metoprolol , amiodarone   Was told that he had VT and after vagal maneuvers by EMS, converted to normal sinus rhythm  Download of his defibrillator confirming ventricular tachycardia not responding to ATP shock delivered  Currently asymptomatic Troponin minimally elevated nontrending 40, 84, 68, 59   Past Medical History:  Diagnosis Date   Actinic keratosis    Arthritis    Atrial fibrillation, persistent-long-term    Chronic systolic congestive heart failure (HCC)    a. 03/2015 Echo: EF 30-35%, diff HK, mild MR, mod dil LA, mildly dil RA, mild TR.   Coronary artery disease    a. 1994 s/p CABG x 1 (VG->OM3);  b. 10/2015 Cath: LM nl, LAD min irregs, D1 nl, D2 min irregs, LCX 30m, OM2 nl, OM3 90 (small territory), VG->OM3 100, RCA min irregs, RPDA/RPL1/RPL2/RPL3 nl, EF 25%-->Med Rx.   Dysrhythmia    Hyperlipidemia    ICD (implantable  cardiac defibrillator) in place    a. 02/2012 s/p SJM 1257-40Q Fortify Assurance single lead AICD.   Mixed Ischemic and Non-ischemic Cardiomyopathy    a. 07/2012 s/p SJM AICD (RV lead only);  b. 03/2015 Echo: EF 30-35%, diff HK;  c. EF 25% by LV gram.   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 07/2012 s/ SJM AICD.    Past Surgical History:  Procedure Laterality Date   basel cell removal     left arm   CARDIAC CATHETERIZATION  02/28/2012   Minor disease in LAD and RCA, LCX: 70% mid and occluded distally. Patent SVG to OM3   CARDIAC CATHETERIZATION N/A 11/08/2015   Procedure: Left Heart Cath and Cors/Grafts Angiography;  Surgeon: [name], MD;  Location: [facility] INVASIVE CV LAB;  Service: Cardiovascular;  Laterality: N/A;   CARDIOVERSION  03/01/2012   Procedure: CARDIOVERSION;  Surgeon: [name], MD;  Location: [facility] OR;  Service: Cardiovascular;  Laterality: N/A;   CATARACT EXTRACTION W/ INTRAOCULAR LENS  IMPLANT, BILATERAL  2011   CORONARY ARTERY BYPASS GRAFT  1994   [facility] ; CABG X1   ELECTROPHYSIOLOGY STUDY N/A 02/28/2012   Procedure: ELECTROPHYSIOLOGY STUDY;  Surgeon: [name], MD;  Location: [facility] CATH LAB;  Service: Cardiovascular;  Laterality: N/A;   ICD GENERATOR CHANGEOUT N/A 03/18/2018   Procedure: ICD GENERATOR CHANGEOUT;  Surgeon: Waddell Danelle ORN, MD;  Location[facility] INVASIVE CV LAB;  Service: Cardiovascular;  Laterality: N/A;   INSERT / REPLACE / REMOVE PACEMAKER  02/28/12   w/defibrillator   JOINT REPLACEMENT     KNEE ARTHROSCOPY  2012   right   LEFT HEART CATH AND CORONARY ANGIOGRAPHY N/A 01/10/2021   Procedure: LEFT HEART CATH AND CORONARY ANGIOGRAPHY;  Surgeon: Darron Deatrice LABOR, MD;  Location: [facility] INVASIVE CV LAB;  Service: Cardiovascular;   Laterality: N/A;   LEFT HEART CATHETERIZATION WITH CORONARY/GRAFT ANGIOGRAM N/A 02/28/2012   Procedure: LEFT HEART CATHETERIZATION WITH EL BILE;  Surgeon: Deatrice LABOR Darron, MD;  Location: [facility] CATH LAB;  Service: Cardiovascular;  Laterality: N/A;   TOTAL KNEE ARTHROPLASTY  2011   left     Home Medications:  Prior to Admission medications   Medication Sig Start Date End Date Taking? Authorizing Provider  Cinnamon  500 MG TABS Take 1,000 mg by mouth 2 (two) times daily.    Yes [provider]  Coenzyme Q10 (CO Q 10 PO) Take 300 mg by mouth daily.   Yes [provider]  eplerenone  (INSPRA ) 25 MG tablet TAKE ONE-HALF TABLET BY MOUTH  DAILY 12/07/23  Yes Darron Deatrice LABOR, MD  folic acid  (FOLVITE ) 1 MG tablet Take 1 mg by mouth daily.    Yes [provider]  furosemide  (LASIX ) 20 MG tablet Take 20 mg by mouth daily. Added by PCP.   Yes [provider]  Garlic  1000 MG CAPS Take 1 capsule by mouth daily.    Yes [provider]  Glucosamine-Chondroit-Vit C-Mn (GLUCOSAMINE CHONDR 1500 COMPLX PO) Take 1,500 mg by mouth 2 (two) times daily.    Yes [provider]  levocetirizine (XYZAL ) 5 MG tablet Take 5 mg by mouth every evening.   Yes [provider]  metoprolol  succinate (TOPROL -XL) 25 MG 24 hr tablet TAKE 1 TABLET BY MOUTH AT  BEDTIME 10/08/23  Yes Arida, Deatrice LABOR, MD  Omega-3 Fatty Acids (FISH OIL PO) Take 1 tablet by mouth 2 (two) times daily.   Yes [provider]  rosuvastatin  (CRESTOR ) 20 MG tablet Take 20 mg by mouth at bedtime. 02/22/24  Yes [provider]  sacubitril -valsartan  (ENTRESTO ) 24-26 MG Take 1 tablet by mouth 2 (two) times daily. 12/07/23  Yes Darron Deatrice LABOR, MD  traZODone  (DESYREL ) 50 MG tablet Take 1 tablet by mouth at bedtime. 12/06/22  Yes [provider]  TRESIBA FLEXTOUCH 200 UNIT/ML FlexTouch Pen SMARTSIG:90 Unit(s) SUB-Q Every Night   Yes [provider]   XARELTO  15 MG TABS tablet TAKE 1 TABLET BY MOUTH ONCE  DAILY WITH A MEAL 10/05/23  Yes Fernande Elspeth BROCKS, MD  Continuous Blood Gluc Sensor (DEXCOM G7 SENSOR) MISC USE 1 SENSOR EVERY 10 DAYS 12/05/22   [provider]    Scheduled Meds:  folic acid   1 mg Oral Daily   furosemide   20 mg Oral Daily   insulin  aspart  0-5 Units Subcutaneous QHS   insulin  aspart  0-9 Units Subcutaneous TID WC   insulin  glargine-yfgn  25 Units Subcutaneous Daily   loratadine   10 mg Oral QHS   metoprolol  succinate  25 mg Oral Daily   mexiletine  150 mg Oral Q12H   omega-3 acid ethyl esters  1 g Oral BID   Rivaroxaban   15 mg Oral Q supper   rosuvastatin   20 mg Oral QHS  traZODone   50 mg Oral QHS   Continuous Infusions:  amiodarone  30 mg/hr (06/12/24 0834)   PRN Meds: acetaminophen , dextrose , hydrALAZINE , ondansetron  (ZOFRAN ) IV, traZODone   Allergies:   No Known Allergies  Social History:   Social History   Socioeconomic History   Marital status: Married    Spouse name: Not on file   Number of children: 3   Years of education: Not on file   Highest education level: Not on file  Occupational History   Not on file  Tobacco Use   Smoking status: Former    Current packs/day: 0.00    Average packs/day: 1 pack/day for 25.0 years (25.0 ttl pk-yrs)    Types: Cigarettes    Start date: 01/08/1963    Quit date: 01/09/1988    Years since quitting: 36.4   Smokeless tobacco: Never  Vaping Use   Vaping status: Never Used  Substance and Sexual Activity   Alcohol use: No   Drug use: No   Sexual activity: Not Currently  Other Topics Concern   Not on file  Social History Narrative   Not on file   Social Drivers of Health   Financial Resource Strain: Patient Declined (03/07/2024)   Received from [facility]   Overall Financial Resource Strain (CARDIA)    Difficulty of Paying Living Expenses: Patient declined  Food Insecurity: Patient Declined (03/07/2024)   Received from [facility]   Hunger Vital Sign    Within the past 12 months, you worried that your food would run out before you got the money to buy more.: Patient declined    Within the past 12 months, the food you bought just didn't last and you didn't have money to get more.: Patient declined  Transportation Needs: Patient Declined (03/07/2024)   Received from [facility] - Transportation    In the past 12 months, has lack of transportation kept you from medical appointments or from getting medications?: Patient declined    Lack of Transportation (Non-Medical): Patient declined  Physical Activity: Not on file  Stress: Not on file  Social Connections: Not on file  Intimate Partner Violence: Not on file    Family History:    Family History  Problem Relation Age of Onset   Heart attack Father    Heart attack Brother      ROS:  Please see the history of present illness.  Review of Systems  Constitutional: Negative.   HENT: Negative.    Respiratory: Negative.    Cardiovascular: Negative.   Gastrointestinal: Negative.   Musculoskeletal: Negative.   Neurological: Negative.   Psychiatric/Behavioral: Negative.    All other systems reviewed and are negative.   Physical Exam/Data: Vitals:   06/12/24 1000 06/12/24 1148 06/12/24 1300 06/12/24 1330  BP: (!) 106/58  (!) 121/91 114/73  Pulse: (!) 59  63 69  Resp: 17     Temp:  97.7 F (36.5 C)    TempSrc:  Oral    SpO2: 95%  96% 100%  Weight:      Height:        Intake/Output Summary (Last 24 hours) at 06/12/2024 1347 Last data filed at 06/11/2024 1939 Gross per 24 hour  Intake 29.94 ml  Output --  Net 29.94 ml      06/11/2024    4:57 PM 01/18/2024    1:19 PM 12/07/2023    9:48 AM  Last 3 Weights  Weight (lbs) 213 lb 215 lb  2 oz 216 lb 6 oz  Weight (kg) 96.616 kg 97.58 kg 98.147 kg     Body mass index is 30.56 kg/m.  General:  Well nourished, well developed, in no acute distress HEENT:  normal Neck: no JVD Vascular: No carotid bruits; Distal pulses 2+ bilaterally Cardiac:  normal S1, S2; RRR; no murmur  Lungs:  clear to auscultation bilaterally, no wheezing, rhonchi or rales  Abd: soft, nontender, no hepatomegaly  Ext: no edema Musculoskeletal:  No deformities, BUE and BLE strength normal and equal Skin: warm and dry  Neuro:  CNs 2-12 intact, no focal abnormalities noted Psych:  Normal affect   EKG:  The EKG was personally reviewed and demonstrates: Atrial fibrillation ventricular rate 92 bpm nonspecific ST abnormality V5, V6,  Telemetry:  Telemetry was personally reviewed and demonstrates: Atrial fibrillation  Relevant CV Studies: Echo pending  Laboratory Data: High Sensitivity Troponin:   Recent Labs  Lab 06/11/24 1655 06/11/24 1830 06/12/24 0204 06/12/24 0511 06/12/24 0926  TROPONINIHS 17 40* 84* 68* 59*     Chemistry Recent Labs  Lab 06/11/24 1655 06/12/24 0204  NA 140 137  K 3.7 5.2*  CL 107 104  CO2 20* 24  GLUCOSE 68* 236*  BUN 39* 36*  CREATININE 1.90* 2.10*  CALCIUM  9.3 8.9  MG 2.1  --   GFRNONAA 35* 31*  ANIONGAP 13 9    Recent Labs  Lab 06/11/24 1655  PROT 7.2  ALBUMIN 3.9  AST 28  ALT 17  ALKPHOS 30*  BILITOT 0.8   Lipids No results for input(s): CHOL, TRIG, HDL, LABVLDL, LDLCALC, CHOLHDL in the last 168 hours.  Hematology Recent Labs  Lab 06/11/24 1655 06/12/24 0359  WBC 9.1 8.9  RBC 4.28 4.22  HGB 14.1 13.9  HCT 42.0 42.2  MCV 98.1 100.0  MCH 32.9 32.9  MCHC 33.6 32.9  RDW 14.7 14.8  PLT 166 160   Thyroid   Recent Labs  Lab 06/12/24 0926 06/12/24 1146  TSH 35.827*  --   FREET4  --  <0.25*    BNP Recent Labs  Lab 06/11/24 1655  BNP 425.4*    DDimer No results for input(s): DDIMER in the last 168 hours.  Radiology/Studies:  CT HEAD WO CONTRAST Result Date: 06/11/2024 CLINICAL DATA:  Head trauma unresponsive EXAM: CT HEAD WITHOUT CONTRAST TECHNIQUE: Contiguous axial images were  obtained from the base of the skull through the vertex without intravenous contrast. RADIATION DOSE REDUCTION: This exam was performed according to the departmental dose-optimization program which includes automated exposure control, adjustment of the mA and/or kV according to patient size and/or use of iterative reconstruction technique. COMPARISON:  CT brain 06/23/2020 FINDINGS: Brain: No acute territorial infarction, hemorrhage or intracranial mass. Moderate atrophy. Stable ventricular size. Vascular: No hyperdense vessels.  Carotid vascular calcification Skull: Normal. Negative for fracture or focal lesion. Sinuses/Orbits: No acute finding. Other: None IMPRESSION: 1. No CT evidence for acute intracranial abnormality. 2. Atrophy. Electronically Signed   By: Luke Bun M.D.   On: 06/11/2024 18:23   CT Cervical Spine Wo Contrast Result Date: 06/11/2024 CLINICAL DATA:  Neck trauma fall, syncope EXAM: CT CERVICAL SPINE WITHOUT CONTRAST TECHNIQUE: Multidetector CT imaging of the cervical spine was performed without intravenous contrast. Multiplanar CT image reconstructions were also generated. RADIATION DOSE REDUCTION: This exam was performed according to the departmental dose-optimization program which includes automated exposure control, adjustment of the mA and/or kV according to patient size and/or use of iterative reconstruction technique. COMPARISON:  05/26/2020 FINDINGS:  Alignment: Degenerative straightening of the normal cervical lordosis. Skull base and vertebrae: No acute fracture. No primary bone lesion or focal pathologic process. Soft tissues and spinal canal: No prevertebral fluid or swelling. No visible canal hematoma. 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 Upper chest: Negative. Other: None. IMPRESSION: 1. No fracture or static subluxation of the cervical spine. 2. 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 Electronically Signed   By: Marolyn JONETTA Jaksch M.D.   On: 06/11/2024 18:08   DG Chest Portable 1 View Result Date: 06/11/2024 CLINICAL DATA:  cp EXAM: PORTABLE CHEST - 1 VIEW COMPARISON:  None available. FINDINGS: Lower lung volumes. Trace left pleural effusion. No focal airspace consolidation or pneumothorax. Moderate cardiomegaly. Left chest pacemaker/AICD with a single lead terminating in the right ventricle. Sternotomy wires and CABG markers. Tortuous aorta with aortic atherosclerosis. No acute fracture or destructive lesions. Multilevel thoracic osteophytosis. IMPRESSION: Unchanged cardiomegaly.  Trace left pleural effusion. Electronically Signed   By: Rogelia Myers M.D.   On: 06/11/2024 17:14     Assessment and Plan: Ventricular tachycardia/ICD shock/syncope -Notes indicating prior history of VT with shock - In the setting of cardiomyopathy, ejection fraction previously noted to be 25% - Has been started on amiodarone  infusion, on beta-blocker - Echo pending - Minimally elevated troponin, less likely primary ischemic event though ischemic workup may be warranted - Continue metoprolol  succinate 25 daily - After 24-48 hours IV amiodarone  load, would likely need oral loading - EP to determine if mexiletine needed  Coronary artery disease with history of CABG Minimally elevated troponin -Will defer to EP whether they prefer to have left heart catheterization for ischemic workup done while inpatient - Currently asymptomatic with no anginal prior to or following event  Hyperlipidemia Continue Crestor   Cardiomyopathy On Entresto  24/26 twice daily, metoprolol  succinate 25 daily, Lasix  20 daily, eplerenone  12.5 daily -Will need to research whether he is a candidate for Farxiga/Jardiance  Permanent atrial fibrillation Rate controlled with metoprolol , tolerating Xarelto  15 daily    For questions or updates, please contact [facility] Please consult [email or web address] for  contact info under    Signed, Carlette Palmatier, MD  06/12/2024 1:47 PM

## 2024-06-12 NOTE — ED Notes (Signed)
 Pt has skin tare on right elbow. Wound was flushed out with saline. Petroleum dressing placed over wound then wrapped in guaze.

## 2024-06-12 NOTE — Care Management Obs Status (Signed)
 MEDICARE OBSERVATION STATUS NOTIFICATION   Patient Details  Name: Andres REIERSON Sr. MRN: [id number] Date of Birth: Feb 20, 1941   Medicare Observation Status Notification Given:  Chaney BRANDY CHRISTIANE LELON, CMA 06/12/2024, 2:10 PM

## 2024-06-12 NOTE — ED Notes (Signed)
 Called to give the daughter-in-law an update. No one answered so a voicemail requesting a callback was left.

## 2024-06-12 NOTE — Progress Notes (Signed)
       CROSS COVER NOTE  NAME: Andres LITTIE Gobble Sr. MRN: [id number] DOB : 27-Sep-1941    Concern as stated by nurse / staff   Andres LITTIE Gobble Sr. is a 83 y.o. male with medical history significant of A-fib on Xarelto , V. tach (s/p of AICD), HLD, DM,CAD, sCHF with EF 20-30%, hypothyroidism, CKD-3b, PMR, kidney stone, SDH, who presents with unresponsiveness.  Per family report, pt was out working in the garden and then came inside and went unresponsive. Daughter witnessed pt go unresponsive. Daughter reported to EMS that she witnessed him get defibrillated twice. On EMS arrival, pt was alert and oriented x4. Per EMS, pt was in Startup on arrival. EMS did vagal maneuver and converted pt to Afib. EDP consulted Dr. Darliss of card. Pt was started on amiodarone  drip in ED.  Pt finished the first 6 hours of amio at 60mg /hr. at 2330 pt was switched over to a rate of 30mg /hr. HR and BP have been slowly dropping all night to where now his HR is dropping in to low 50s and last BP at 0100 was 106/61 ([id number]). I couldn't find any parameters in the order as to what to do if HR or BP consistently get lower.   Thank you!      Pertinent findings on chart review:   Patient Assessment   Assessment and  Interventions   Assessment:  A-fib with slow ventricular response 50-60  Plan: Hold amiodarone  infusion for rate under 65 Resume at lower rate of 15 mg/h for heart rate 65-100 Resume at rate of 30 mg/h for heart rate of 100 X Discontinue metoprolol  X

## 2024-06-12 NOTE — ED Notes (Signed)
 MD reached out to in regards to hear rate and blood pressure parameters for amiodarone  drip.

## 2024-06-13 ENCOUNTER — Telehealth (HOSPITAL_COMMUNITY): Payer: Self-pay | Admitting: Pharmacy Technician

## 2024-06-13 ENCOUNTER — Other Ambulatory Visit (HOSPITAL_COMMUNITY): Payer: Self-pay

## 2024-06-13 ENCOUNTER — Encounter: Payer: Self-pay | Admitting: Internal Medicine

## 2024-06-13 ENCOUNTER — Encounter
Admission: EM | Disposition: A | Discharge status: Home / Self Care | Payer: Self-pay | Source: Home / Self Care | Attending: Obstetrics and Gynecology

## 2024-06-13 DIAGNOSIS — R7989 Other specified abnormal findings of blood chemistry: Secondary | ICD-10-CM

## 2024-06-13 DIAGNOSIS — I472 Ventricular tachycardia, unspecified: Secondary | ICD-10-CM | POA: Diagnosis not present

## 2024-06-13 DIAGNOSIS — I5023 Acute on chronic systolic (congestive) heart failure: Secondary | ICD-10-CM | POA: Diagnosis not present

## 2024-06-13 DIAGNOSIS — I4821 Permanent atrial fibrillation: Secondary | ICD-10-CM | POA: Diagnosis not present

## 2024-06-13 DIAGNOSIS — I2510 Atherosclerotic heart disease of native coronary artery without angina pectoris: Secondary | ICD-10-CM | POA: Diagnosis not present

## 2024-06-13 HISTORY — PX: LEFT HEART CATH AND CORONARY ANGIOGRAPHY: CATH118249

## 2024-06-13 LAB — BASIC METABOLIC PANEL WITH GFR
Anion gap: 9 (ref 5–15)
BUN: 35 mg/dL — ABNORMAL HIGH (ref 8–23)
CO2: 25 mmol/L (ref 22–32)
Calcium: 9.1 mg/dL (ref 8.9–10.3)
Chloride: 105 mmol/L (ref 98–111)
Creatinine, Ser: 1.62 mg/dL — ABNORMAL HIGH (ref 0.61–1.24)
GFR, Estimated: 42 mL/min — ABNORMAL LOW (ref >60)
Glucose, Bld: 130 mg/dL — ABNORMAL HIGH (ref 70–99)
Potassium: 4.3 mmol/L (ref 3.5–5.1)
Sodium: 139 mmol/L (ref 135–145)

## 2024-06-13 LAB — GLUCOSE, CAPILLARY
Glucose-Capillary: 119 mg/dL — ABNORMAL HIGH (ref 70–99)
Glucose-Capillary: 292 mg/dL — ABNORMAL HIGH (ref 70–99)
Glucose-Capillary: 189 mg/dL — ABNORMAL HIGH (ref 70–99)
Glucose-Capillary: 241 mg/dL — ABNORMAL HIGH (ref 70–99)

## 2024-06-13 SURGERY — LEFT HEART CATH AND CORONARY ANGIOGRAPHY
Anesthesia: Moderate Sedation

## 2024-06-13 MED ORDER — AMIODARONE HCL 200 MG PO TABS
400.0000 mg | ORAL_TABLET | Freq: Every day | ORAL | Status: DC
  Administered 2024-06-13 – 2024-06-14 (×2): 400 mg via ORAL
  Filled 2024-06-13 (×2): qty 2

## 2024-06-13 MED ORDER — HEPARIN (PORCINE) IN NACL 1000-0.9 UT/500ML-% IV SOLN
INTRAVENOUS | Status: DC | PRN
  Administered 2024-06-13 (×2): 1000 mL

## 2024-06-13 MED ORDER — ASPIRIN 81 MG PO CHEW: 81.0000 mg | CHEWABLE_TABLET | ORAL | Status: DC

## 2024-06-13 MED ORDER — FUROSEMIDE 10 MG/ML IJ SOLN
20.0000 mg | Freq: Once | INTRAMUSCULAR | Status: AC
  Administered 2024-06-13: 20 mg via INTRAVENOUS
  Filled 2024-06-13: qty 2

## 2024-06-13 MED ORDER — LIDOCAINE HCL 1 % IJ SOLN
INTRAMUSCULAR | Status: AC
  Filled 2024-06-13: qty 20

## 2024-06-13 MED ORDER — SODIUM CHLORIDE 0.9 % IV SOLN
250.0000 mL | INTRAVENOUS | Status: AC | PRN
Start: 2024-06-13 — End: 2024-06-14

## 2024-06-13 MED ORDER — RIVAROXABAN 15 MG PO TABS
15.0000 mg | ORAL_TABLET | Freq: Every day | ORAL | Status: DC
  Administered 2024-06-13: 15 mg via ORAL
  Filled 2024-06-13 (×2): qty 1

## 2024-06-13 MED ORDER — VERAPAMIL HCL 2.5 MG/ML IV SOLN
INTRAVENOUS | Status: AC
  Filled 2024-06-13: qty 2

## 2024-06-13 MED ORDER — VERAPAMIL HCL 2.5 MG/ML IV SOLN
INTRAVENOUS | Status: DC | PRN
  Administered 2024-06-13 (×2): 2.5 mg via INTRA_ARTERIAL

## 2024-06-13 MED ORDER — ASPIRIN 81 MG PO CHEW
81.0000 mg | CHEWABLE_TABLET | ORAL | Status: AC
  Administered 2024-06-13: 81 mg via ORAL
  Filled 2024-06-13: qty 1

## 2024-06-13 MED ORDER — HEPARIN (PORCINE) IN NACL 1000-0.9 UT/500ML-% IV SOLN
INTRAVENOUS | Status: AC
  Filled 2024-06-13: qty 1000

## 2024-06-13 MED ORDER — SODIUM CHLORIDE 0.9 % IV SOLN: INTRAVENOUS | Status: DC

## 2024-06-13 MED ORDER — LIDOCAINE HCL (PF) 1 % IJ SOLN
INTRAMUSCULAR | Status: DC | PRN
  Administered 2024-06-13: 2 mL

## 2024-06-13 MED ORDER — FENTANYL CITRATE (PF) 100 MCG/2ML IJ SOLN
INTRAMUSCULAR | Status: DC | PRN
  Administered 2024-06-13: 25 ug via INTRAVENOUS

## 2024-06-13 MED ORDER — SODIUM CHLORIDE 0.9% FLUSH: 3.0000 mL | INTRAVENOUS | Status: DC | PRN

## 2024-06-13 MED ORDER — SODIUM CHLORIDE 0.9 % IV SOLN: INTRAVENOUS | Status: AC

## 2024-06-13 MED ORDER — IOHEXOL 300 MG/ML  SOLN
INTRAMUSCULAR | Status: DC | PRN
  Administered 2024-06-13: 30 mL

## 2024-06-13 MED ORDER — HYDRALAZINE HCL 20 MG/ML IJ SOLN: 10.0000 mg | INTRAMUSCULAR | Status: AC | PRN

## 2024-06-13 MED ORDER — MIDAZOLAM HCL 2 MG/2ML IJ SOLN
INTRAMUSCULAR | Status: DC | PRN
  Administered 2024-06-13: 1 mg via INTRAVENOUS

## 2024-06-13 MED ORDER — SODIUM CHLORIDE 0.9% FLUSH
3.0000 mL | Freq: Two times a day (BID) | INTRAVENOUS | Status: DC
  Administered 2024-06-13 – 2024-06-14 (×3): 3 mL via INTRAVENOUS

## 2024-06-13 MED ORDER — FENTANYL CITRATE (PF) 100 MCG/2ML IJ SOLN
INTRAMUSCULAR | Status: AC
  Filled 2024-06-13: qty 2

## 2024-06-13 MED ORDER — MIDAZOLAM HCL 2 MG/2ML IJ SOLN
INTRAMUSCULAR | Status: AC
  Filled 2024-06-13: qty 2

## 2024-06-13 MED ORDER — HEPARIN SODIUM (PORCINE) 1000 UNIT/ML IJ SOLN
INTRAMUSCULAR | Status: AC
  Filled 2024-06-13: qty 10

## 2024-06-13 MED ORDER — HEPARIN SODIUM (PORCINE) 1000 UNIT/ML IJ SOLN
INTRAMUSCULAR | Status: DC | PRN
  Administered 2024-06-13: 5000 [IU] via INTRAVENOUS

## 2024-06-13 SURGICAL SUPPLY — 12 items
CATH INFINITI 5 FR JL3.5 (CATHETERS) IMPLANT
CATH INFINITI JR4 5F (CATHETERS) IMPLANT
DEVICE RAD TR BAND REGULAR (VASCULAR PRODUCTS) IMPLANT
DRAPE BRACHIAL (DRAPES) IMPLANT
GLIDESHEATH SLEND SS 6F .021 (SHEATH) IMPLANT
GUIDEWIRE INQWIRE 1.5J.035X260 (WIRE) IMPLANT
KIT SYRINGE INJ CVI SPIKEX1 (MISCELLANEOUS) IMPLANT
PACK CARDIAC CATH (CUSTOM PROCEDURE TRAY) ×1 IMPLANT
PAD ELECT DEFIB RADIOL ZOLL (MISCELLANEOUS) IMPLANT
SET ATX-X65L (MISCELLANEOUS) IMPLANT
STATION PROTECTION PRESSURIZED (MISCELLANEOUS) IMPLANT
WIRE HITORQ VERSACORE ST 145CM (WIRE) IMPLANT

## 2024-06-13 NOTE — Progress Notes (Signed)
 Heart Failure Nurse Navigator Progress Note  PCP: [name], MD PCP-Cardiologist: Deatrice Cage, MD Admission Diagnosis:  Ventricular tachycardia [facility]) Syncope and collapse Admitted from: Home  Presentation:   Andres LITTIE Gobble Sr. presented with concern for syncopal episode.  Patient was found down on the ground by family when they heard a thud. Patient appeared unwell and family member did a sternal rub to wake him up.  When EMS arrived he was talking but then went into V Tach and was delivered a second shock by his ICD.VT resolved with vagal maneuvers. BNP 425.4, HS-troponin 17>40>68>52. Chest x-ray-trace left pleural effusion.  CT head with no acute abnormalities.  ECHO/ LVEF: 20-25%  Clinical Course:  Past Medical History:  Diagnosis Date   Actinic keratosis    Arthritis    Atrial fibrillation, persistent-long-term    Chronic systolic congestive heart failure (HCC)    a. 03/2015 Echo: EF 30-35%, diff HK, mild MR, mod dil LA, mildly dil RA, mild TR.   Coronary artery disease    a. 1994 s/p CABG x 1 (VG->OM3);  b. 10/2015 Cath: LM nl, LAD min irregs, D1 nl, D2 min irregs, LCX 15m, OM2 nl, OM3 90 (small territory), VG->OM3 100, RCA min irregs, RPDA/RPL1/RPL2/RPL3 nl, EF 25%-->Med Rx.   Dysrhythmia    Hyperlipidemia    ICD (implantable cardiac defibrillator) in place    a. 02/2012 s/p SJM 1257-40Q Fortify Assurance single lead AICD.   Mixed Ischemic and Non-ischemic Cardiomyopathy    a. 07/2012 s/p SJM AICD (RV lead only);  b. 03/2015 Echo: EF 30-35%, diff HK;  c. EF 25% by LV gram.   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 07/2012 s/ SJM AICD.     Social History   Socioeconomic History   Marital status: Married    Spouse name: Not on file    Number of children: 3   Years of education: Not on file   Highest education level: Not on file  Occupational History   Not on file  Tobacco Use   Smoking status: Former    Current packs/day: 0.00    Average packs/day: 1 pack/day for 25.0 years (25.0 ttl pk-yrs)    Types: Cigarettes    Start date: 01/08/1963    Quit date: 01/09/1988    Years since quitting: 36.4   Smokeless tobacco: Never  Vaping Use   Vaping status: Never Used  Substance and Sexual Activity   Alcohol use: No   Drug use: No   Sexual activity: Not Currently  Other Topics Concern   Not on file  Social History Narrative   Not on file   Social Drivers of Health   Financial Resource Strain: Patient Declined (03/07/2024)   Received from [facility]   Overall Financial Resource Strain (CARDIA)    Difficulty of Paying Living Expenses: Patient declined  Food Insecurity: No Food Insecurity (06/12/2024)   Hunger Vital Sign    Worried About Running Out of Food in the Last Year: Never true    Ran Out of Food in the Last Year: Never true  Transportation Needs: Unknown (06/12/2024)   PRAPARE - Administrator, Civil Service (Medical): No  Lack of Transportation (Non-Medical): Not on file  Physical Activity: Not on file  Stress: Not on file  Social Connections: Unknown (06/12/2024)   Social Connection and Isolation Panel    Frequency of Communication with Friends and Family: Once a week    Frequency of Social Gatherings with Friends and Family: Three times a week    Attends Religious Services: 1 to 4 times per year    Active Member of Clubs or Organizations: Patient declined    Attends Banker Meetings: Patient declined    Marital Status: Married  Water engineer and Provision:  Detailed education and instructions provided on heart failure disease management including the following:  Signs and symptoms of Heart Failure When to call the physician Importance of daily  weights Low sodium diet Fluid restriction Medication management Anticipated future follow-up appointments  Patient education given on each of the above topics.  Patient acknowledges understanding via teach back method and acceptance of all instructions.  Education Materials:  Living Better With Heart Failure Booklet, HF zone tool, & Daily Weight Tracker Tool.  Patient has scale at home: Yes Patient has pill box at home: Yes    High Risk Criteria for Readmission and/or Poor Patient Outcomes: Heart failure hospital admissions (last 6 months): 0  No Show rate: 6% Difficult social situation: None Demonstrates medication adherence: Yes Primary Language: English Literacy level: Reading, Writing & Comprehension  Barriers of Care:   Transportation is a concern to him now because he can't drive for 6 months.    Considerations/Referrals:   Referral made to Heart Failure Pharmacist Stewardship: Yes Referral made to Heart Failure CSW/NCM TOC: No Referral made to Heart & Vascular TOC clinic: Yes. 06/19/24 @ 1:30 PM  Items for Follow-up on DC/TOC: Diet & Fluid Restrictions Daily Weights Continued Heart Failure Education  Charmaine Pines, RN, BSN [facility] Heart Failure Navigator Secure Chat Only

## 2024-06-13 NOTE — Plan of Care (Signed)

## 2024-06-13 NOTE — Progress Notes (Signed)
 Heart Failure Stewardship Pharmacy Note  PCP: [name], MD PCP-Cardiologist: Deatrice Cage, MD  HPI: Andres LITTIE Gobble Sr. is a 83 y.o. male with CKD, CHF, coronary disease, hypertension, A-fib on Xarelto , T2DM, hx of SDH, HLD, hypothyroidism  who presented with unresponsiveness. Per the patient's daughter, he was defibrillated twice via his device. EMS arrived and the patient was alert and oriented in VT, which resolved with vagal maneuvers. On admission, BNP was 425.4, HS-troponin was 17 >68 > 52, TSH 41.943, and T4 <0.25. Chest x-ray noted trace left pleural effusion. CT head with no acute abnormalities.   Pertinent cardiac history: Underwent CABG in 1994. TTE in 05/2011 showed LVEF of 30-35%. ICD implanted 02/2012. LVEF decreased to 25-30% on TTE in 06/2011. LHC in 10/2015 showed significant one-vessel CAD involving distal left circumflex into OM 3 and occluded SVG to OM 3. LVEF at that time 15%. LHC 12/2020 with no significant changes from 2016. TTE this admission noted LVEF of 20-25%, normal RV function, mild to moderate TR. LHC repeated this admission showing stable severe single vessel disease and chronically occluded SVG-OM3 similar to previous.  Pertinent Lab Values: Creatinine  Date Value Ref Range Status  01/17/2013 1.27 0.60 - 1.30 mg/dL Final   Creatinine, Ser  Date Value Ref Range Status  06/13/2024 1.62 (H) 0.61 - 1.24 mg/dL Final   BUN  Date Value Ref Range Status  06/13/2024 35 (H) 8 - 23 mg/dL Final  97/98/7977 24 8 - 27 mg/dL Final  97/78/7985 15 7 - 18 mg/dL Final   Potassium  Date Value Ref Range Status  06/13/2024 4.3 3.5 - 5.1 mmol/L Final  01/17/2013 4.3 3.5 - 5.1 mmol/L Final   Sodium  Date Value Ref Range Status  06/13/2024 139 135 - 145 mmol/L Final  12/28/2020 138 134 - 144 mmol/L Final  01/17/2013 137 136 - 145 mmol/L Final   B Natriuretic Peptide  Date Value Ref Range Status  06/11/2024 425.4 (H) 0.0 - 100.0 pg/mL Final    Comment:     Performed at [facility], [address] [place], [place] [address]   Magnesium   Date Value Ref Range Status  06/11/2024 2.1 1.7 - 2.4 mg/dL Final    Comment:    Performed at [facility], [address] [place], [place] [address]   Hgb A1c MFr Bld  Date Value Ref Range Status  05/26/2020 6.8 (H) 4.8 - 5.6 % Final    Comment:    (NOTE)         Prediabetes: 5.7 - 6.4         Diabetes: >6.4         Glycemic control for adults with diabetes: <7.0    Digoxin  Level  Date Value Ref Range Status  09/03/2020 0.6 (L) 1.0 - 2.0 ng/mL Final    Comment:    Performed at [facility], [address] [place], [place] [address]   TSH  Date Value Ref Range Status  06/12/2024 35.827 (H) 0.350 - 4.500 uIU/mL Final    Comment:    Performed by a 3rd Generation assay with a functional sensitivity of <=0.01 uIU/mL. Performed at [facility], [address] [place], [place] [address]   05/31/2021 6.080 (H) 0.450 - 4.500 uIU/mL Final    Vital Signs: Admission weight: Temp:  [97.5 F (36.4 C)-98.4 F (36.9 C)] 97.9 F (36.6 C) (07/18 0759) Pulse Rate:  [52-88] 68 (07/18 0759) Cardiac Rhythm: Atrial fibrillation (07/17 2344) Resp:  [  15-26] 15 (07/18 0759) BP: (100-172)/(50-154) 135/84 (07/18 0759) SpO2:  [89 %-100 %] 99 % (07/18 0759) FiO2 (%):  [21 %] 21 % (07/17 2350) Weight:  [96.1 kg (211 lb 13.8 oz)] 96.1 kg (211 lb 13.8 oz) (07/18 0500)  Intake/Output Summary (Last 24 hours) at 06/13/2024 9192 Last data filed at 06/13/2024 9447 Gross per 24 hour  Intake 480 ml  Output 900 ml  Net -420 ml    Current Heart Failure Medications:  Loop diuretic: none Beta-Blocker: metoprolol  succinate 25 mg daily ACEI/ARB/ARNI: none MRA: none SGLT2i: none  Prior to admission Heart Failure Medications:  Loop diuretic: furosemide  20 mg daily Beta-Blocker: metoprolol  succinate 25 mg daily ACEI/ARB/ARNI: Entresto  24-26 mg BID MRA: eplerenone  12.5 mg  daily SGLT2i: none  Assessment: 1. 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 NYHA class II-III symptoms.  -Symptoms: Patient reports feeling well. Denies shortness of breath, LEE, and orthopnea. Appetite good. -Volume: Appears to be euvolemic. Unable to see filling pressures from cath today. Not currently on diuretics. -Hemodynamics: BP soft today post cath. HR also low ~50s. -BB: Currently on metoprolol  succinate 25 mg daily. This has benefit to VT suppression in addition to CHF, however bradycardia is limiting dose titration. -ACEI/ARB/ARNI: BP too soft at this time to restart home Entresto . Can consider if BP improves tomorrow. -MRA: Home eplerenone  currently held. Can consider resuming if BP improves tomorrow. -SGLT2i: Copay check pending. Would consider adding this admission vs outpatient if affordable.   Plan: 1) Medication changes recommended at this time: -None  2) Patient assistance: -Pending  3) Education: - Patient has been educated on current HF medications and potential additions to HF medication regimen - Patient verbalizes understanding that over the next few months, these medication doses may change and more medications may be added to optimize HF regimen - Patient has been educated on basic disease state pathophysiology and goals of therapy  Medication Assistance / Insurance Benefits Check: Does the patient have prescription insurance?    Type of insurance plan:  Does the patient qualify for medication assistance through manufacturers or grants? No         Please do not hesitate to reach out with questions or concerns,  Jaun Bash, PharmD, CPP, BCPS, [facility] Heart Failure Pharmacist  Phone - [phone] 06/13/2024 12:17 PM

## 2024-06-13 NOTE — Progress Notes (Signed)
 pt is bradying down to 50's lowest at 36 non sustaining, pt is asymptomatic. pt on amiodarone  drip because pt came in with Vtach, unresponsiveness. Pt's HR is sustaining down to upper 50's for about 2-3 mins and frequent, specially when pt is asleep. per pt he does have OSA which he uses CPAP here, per parameter to stop amiodarone  drip if HR is sustaining under 50's, BP108/68 (79), other VSS. Notify provider on call. Will continue Amiodarone  drip and if HR sustain under 50 will also notify cardiology. No other concern at the moment. Plan of care continued.

## 2024-06-13 NOTE — Telephone Encounter (Signed)
 Pharmacy Patient Advocate Encounter  Insurance verification completed.    The patient is insured through [facility].     Ran test claim for Jardiance 10mg  and the current 30 day co-pay is $0.00.  Ran test claim for Farxiga 10mg  tablets and the current 30 day co-pay is $0.00.   This test claim was processed through [facility]- copay amounts may vary at other pharmacies due to pharmacy/plan contracts, or as the patient moves through the different stages of their insurance plan.

## 2024-06-13 NOTE — Brief Op Note (Signed)
 06/13/2024  10:01 AM  PATIENT:  Andres LITTIE Gobble Sr.  83 y.o. male  PRE-OPERATIVE DIAGNOSIS:  VT  POST-OPERATIVE DIAGNOSIS:  Same  PROCEDURE:  Procedure(s): LEFT HEART CATH AND CORONARY ANGIOGRAPHY (N/A)  SURGEON:  Surgeons and Role:    * Tara Rud, MD - Primary  FINDINGS: Stable severe single vessel CAD involving distal LCx/OM3, similar to last catheterization.  Non-obstructive disease noted in LAD and RCA. Chronically occluded SVG-OM3. Normal LVEDP.  RECOMMENDATIONS: Continue secondary prevention of CAD. Continue GDMT for chronic HFrEF, as tolerated. 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  Lonni Hanson, MD [facility]

## 2024-06-13 NOTE — Progress Notes (Signed)
 PROGRESS NOTE    Andres WALKER Sr.  FMW:[id number] DOB: [birth year] DOA: 06/11/2024 PCP: [name], MD  Outpatient Specialists: cardiology    Brief Narrative:   From admission h and p  Andres LITTIE Gobble Sr. is a 83 y.o. male with medical history significant of A-fib on Xarelto , V. tach (s/p of AICD), HLD, DM,CAD, sCHF with EF 20-30%, hypothyroidism, CKD-3b, PMR, kidney stone, SDH, who presents with unresponsiveness.   Per family report, pt was out working in the garden and then came inside and went unresponsive. Daughter witnessed pt go unresponsive. Daughter reported to EMS that she witnessed him get defibrillated twice. On EMS arrival, pt was alert and oriented x4. Per EMS, pt was in [place] on arrival. EMS did vagal maneuver and converted pt to Afib. EDP consulted Dr. Darliss of card. Pt was started on amiodarone  drip in ED.  While I saw patient in the ED, he is asymptomatic, denies chest pain, cough, SOB.  No nausea, vomiting, diarrhea or abdominal pain.  No symptoms of UTI.  No fever or chills.  His heart rate is in the 70s.  Assessment & Plan:   Principal Problem:   Ventricular tachycardia (HCC) Active Problems:   Unresponsiveness   Ischemic cardiomyopathy   Chronic systolic congestive heart failure (HCC)   Myocardial injury   Coronary artery disease   Hypothyroidism   Hyperlipidemia   Type II diabetes mellitus with renal manifestations (HCC)   Chronic kidney disease, stage 3b (HCC)   Obesity (BMI 30-39.9)   Automatic implantable cardioverter-defibrillator in situ   Syncope and collapse   Benign essential hypertension   PMR (polymyalgia rheumatica) (HCC)   Sleep apnea   Atrial fibrillation, chronic (HCC)   Defibrillator discharge   Elevated troponin  # V-tach History of, has ICD. Found down at home, found to be in v-tach, treated with vagal maneuver in the field and amio here. HR currently controlled, a-fib rhythm. ICD settings have been adjusted by EP. LHC  today showing stable severe single fessel CAD (LCx/OM3). - on amio - cardiology and EP following  # Syncope # Skin tear Skin tear proximal to right elbow, bandaged. No apparent other injury. CT head/c-spine negative. Syncope 2/2 v-tach as above.  # Ischemic cardiomyopathy # HFrEF 2022 echo EF 25-30. Appears euvolemic. EF here 20-25, mild/mod MR - eplerenone  and entreso on hold 2/2 hyperkalemia which has resolved, will defer resumption to cardiology - home lasix  held - home metop has been resumed  # A-rib In a-fib, rate controlled currently - continue amio infusion - continue home xarelto   # Demand ischemia # CAD Trops have trended to 84, no chest pain, suspect 2/2 v-tach. Hx 1-vessel CABG in the 90s - defer decision to continue to trend to cardiology - cont home statin  # Hyperkalemia Likely 2/2 repletion yesterday on ckd 3b. resolved - home eplerenone  and entresto  on hold, resume per cardiology  # Hypothyroidism TSH markedly elevated. Reports out of levothyroxine  last 2 months. Prior to that tsh was wnl.  - resumed home levothyroxine , would rx at discharge - repeat TFTs 4-6 wks  # T2DM Glucose labile. appropriate this morning.   - semglee  25 daily   - SSI  # CKD 3a GFR low 30s, stable - trend    DVT prophylaxis: n/a (on therapeutic anticoagulation) Code Status: full Family Communication: daughter eleanor updated telephonically 7/18  Level of care: Stepdown Status is: inpt     Consultants:  Cardiology, EP  Procedures: LHC 7/18  Antimicrobials:  none    Subjective: Reports feeling fine, no chest pain or palpitations or dyspnea  Objective: Vitals:   06/13/24 1030 06/13/24 1100 06/13/24 1105 06/13/24 1145  BP: 105/61  (!) 105/58 (!) 94/57  Pulse: (!) 55 (!) 53    Resp: 16  (!) 21   Temp:      TempSrc:      SpO2: 96% 98% 99%   Weight:      Height:        Intake/Output Summary (Last 24 hours) at 06/13/2024 1423 Last data filed at 06/13/2024  1300 Gross per 24 hour  Intake 480 ml  Output 1250 ml  Net -770 ml   Filed Weights   06/11/24 1657 06/13/24 0500  Weight: 96.6 kg 96.1 kg    Examination:  General exam: Appears calm and comfortable  Respiratory system: Clear to auscultation. Respiratory effort normal. Cardiovascular system: S1 & S2 heard, irreg irreg, soft systolic murmur Gastrointestinal system: Abdomen is nondistended, soft and nontender. Central nervous system: Alert and oriented. No focal neurological deficits. Extremities: Symmetric 5 x 5 power. Skin: dressing over right arm. Psychiatry: Judgement and insight appear normal. Mood & affect appropriate.     Data Reviewed: I have personally reviewed following labs and imaging studies  CBC: Recent Labs  Lab 06/11/24 1655 06/12/24 0359  WBC 9.1 8.9  NEUTROABS 4.8  --   HGB 14.1 13.9  HCT 42.0 42.2  MCV 98.1 100.0  PLT 166 160   Basic Metabolic Panel: Recent Labs  Lab 06/11/24 1655 06/12/24 0204 06/12/24 1146 06/13/24 0408  NA 140 137 137 139  K 3.7 5.2* 4.8 4.3  CL 107 104 105 105  CO2 20* 24 25 25   GLUCOSE 68* 236* 280* 130*  BUN 39* 36* 31* 35*  CREATININE 1.90* 2.10* 1.67* 1.62*  CALCIUM  9.3 8.9 8.9 9.1  MG 2.1  --   --   --    GFR: Estimated Creatinine Clearance: 40.2 mL/min (A) (by C-G formula based on SCr of 1.62 mg/dL (H)). Liver Function Tests: Recent Labs  Lab 06/11/24 1655  AST 28  ALT 17  ALKPHOS 30*  BILITOT 0.8  PROT 7.2  ALBUMIN 3.9   No results for input(s): LIPASE, AMYLASE in the last 168 hours. No results for input(s): AMMONIA in the last 168 hours. Coagulation Profile: No results for input(s): INR, PROTIME in the last 168 hours. Cardiac Enzymes: No results for input(s): CKTOTAL, CKMB, CKMBINDEX, TROPONINI in the last 168 hours. BNP (last 3 results) No results for input(s): PROBNP in the last 8760 hours. HbA1C: No results for input(s): HGBA1C in the last 72 hours. CBG: Recent Labs   Lab 06/12/24 1549 06/12/24 2113 06/12/24 2116 06/13/24 0735 06/13/24 1230  GLUCAP 224* 123* 200* 119* 292*   Lipid Profile: No results for input(s): CHOL, HDL, LDLCALC, TRIG, CHOLHDL, LDLDIRECT in the last 72 hours. Thyroid  Function Tests: Recent Labs    06/12/24 0926 06/12/24 1146  TSH 35.827*  --   FREET4  --  <0.25*   Anemia Panel: No results for input(s): VITAMINB12, FOLATE, FERRITIN, TIBC, IRON, RETICCTPCT in the last 72 hours. Urine analysis:    Component Value Date/Time   COLORURINE Straw 12/31/2012 0905   APPEARANCEUR Clear 12/31/2012 0905   LABSPEC 1.010 12/31/2012 0905   PHURINE 5.0 12/31/2012 0905   GLUCOSEU 50 mg/dL 97/95/7985 9094   HGBUR Negative 12/31/2012 0905   BILIRUBINUR Negative 12/31/2012 0905   KETONESUR Negative 12/31/2012 0905   PROTEINUR Negative 12/31/2012 0905  NITRITE Negative 12/31/2012 0905   LEUKOCYTESUR Negative 12/31/2012 0905   Sepsis Labs: @LABRCNTIP (procalcitonin:4,lacticidven:4)  )No results found for this or any previous visit (from the past 240 hours).       Radiology Studies: ECHOCARDIOGRAM COMPLETE Result Date: 06/12/2024    ECHOCARDIOGRAM REPORT   Patient Name:   SR. Andres LITTIE Gobble Sr. Date of Exam: 06/12/2024 Medical Rec #:  [id number]                 Height:       70.0 in Accession #:    [id number]                Weight:       213.0 lb Date of Birth:  1941/07/15                 BSA:          2.144 m Patient Age:    82 years                  BP:           106/58 mmHg Patient Gender: M                         HR:           58 bpm. Exam Location:  [facility] Procedure: 2D Echo, Cardiac Doppler, Color Doppler and Intracardiac            Opacification Agent (Both Spectral and Color Flow Doppler were            utilized during procedure). Indications:     Ventricular Tachycardia I47.2  History:         Patient has prior history of Echocardiogram examinations, most                  recent 02/22/2021. CHF; Abnormal  ECG and Pacemaker.  Sonographer:     Ashley McNeely-Sloane Referring Phys:  [id number] RIDDLE Diagnosing Phys: Evalene Lunger MD IMPRESSIONS  1. Left ventricular ejection fraction, by estimation, is 20 to 25%. Left ventricular ejection fraction by 2D MOD biplane is 27.2 %. The left ventricle has severely decreased function. The left ventricle demonstrates global hypokinesis. The left ventricular internal cavity size was moderately dilated. Left ventricular diastolic parameters are indeterminate.  2. Right ventricular systolic function is normal. The right ventricular size is mildly enlarged. There is normal pulmonary artery systolic pressure. The estimated right ventricular systolic pressure is 20.4 mmHg.  3. Left atrial size was moderately dilated.  4. Right atrial size was mildly dilated.  5. The mitral valve is normal in structure. No evidence of mitral valve regurgitation. No evidence of mitral stenosis.  6. Tricuspid valve regurgitation is mild to moderate.  7. The aortic valve is normal in structure. Aortic valve regurgitation is not visualized. No aortic stenosis is present.  8. The inferior vena cava is normal in size with greater than 50% respiratory variability, suggesting right atrial pressure of 3 mmHg. FINDINGS  Left Ventricle: Left ventricular ejection fraction, by estimation, is 20 to 25%. Left ventricular ejection fraction by 2D MOD biplane is 27.2 %. The left ventricle has severely decreased function. The left ventricle demonstrates global hypokinesis. Definity  contrast agent was given IV to delineate the left ventricular endocardial borders. Strain was performed and the global longitudinal strain is indeterminate. The left ventricular internal cavity size was moderately dilated. There is no left ventricular hypertrophy. Left ventricular diastolic  parameters are indeterminate. Right Ventricle: The right ventricular size is mildly enlarged. No increase in right ventricular wall thickness. Right  ventricular systolic function is normal. There is normal pulmonary artery systolic pressure. The tricuspid regurgitant velocity is 1.96  m/s, and with an assumed right atrial pressure of 5 mmHg, the estimated right ventricular systolic pressure is 20.4 mmHg. Left Atrium: Left atrial size was moderately dilated. Right Atrium: Right atrial size was mildly dilated. Pericardium: Trivial pericardial effusion is present. Mitral Valve: The mitral valve is normal in structure. No evidence of mitral valve regurgitation. No evidence of mitral valve stenosis. MV peak gradient, 2.7 mmHg. The mean mitral valve gradient is 1.0 mmHg. Tricuspid Valve: The tricuspid valve is normal in structure. Tricuspid valve regurgitation is mild to moderate. No evidence of tricuspid stenosis. Aortic Valve: The aortic valve is normal in structure. Aortic valve regurgitation is not visualized. No aortic stenosis is present. Aortic valve mean gradient measures 2.0 mmHg. Aortic valve peak gradient measures 4.6 mmHg. Aortic valve area, by VTI measures 2.15 cm. Pulmonic Valve: The pulmonic valve was normal in structure. Pulmonic valve regurgitation is not visualized. No evidence of pulmonic stenosis. Aorta: The aortic root is normal in size and structure. Venous: The inferior vena cava is normal in size with greater than 50% respiratory variability, suggesting right atrial pressure of 3 mmHg. IAS/Shunts: No atrial level shunt detected by color flow Doppler. Additional Comments: 3D was performed not requiring image post processing on an independent workstation and was indeterminate. A device lead is visualized.  LEFT VENTRICLE PLAX 2D                        Biplane EF (MOD) LVIDd:         6.20 cm         LV Biplane EF:   Left LVIDs:         6.20 cm                          ventricular LV PW:         1.20 cm                          ejection LV IVS:        0.90 cm                          fraction by LVOT diam:     2.30 cm                          2D  MOD LV SV:         49                               biplane is LV SV Index:   23                               27.2 %. LVOT Area:     4.15 cm                                Diastology  LV e' medial:    8.03 cm/s LV Volumes (MOD)               LV E/e' medial:  6.7 LV vol d, MOD    97.1 ml       LV e' lateral:   9.83 cm/s A2C:                           LV E/e' lateral: 5.5 LV vol d, MOD    91.6 ml A4C: LV vol s, MOD    60.6 ml A2C: LV vol s, MOD    81.1 ml A4C: LV SV MOD A2C:   36.5 ml LV SV MOD A4C:   91.6 ml LV SV MOD BP:    27.1 ml RIGHT VENTRICLE RV Basal diam:  5.50 cm RV Mid diam:    4.20 cm RV S prime:     10.10 cm/s TAPSE (M-mode): 1.7 cm LEFT ATRIUM            Index        RIGHT ATRIUM           Index LA diam:      4.80 cm  2.24 cm/m   RA Area:     31.10 cm LA Vol (A4C): 145.0 ml 67.63 ml/m  RA Volume:   102.00 ml 47.58 ml/m  AORTIC VALVE                    PULMONIC VALVE AV Area (Vmax):    2.18 cm     PV Vmax:        0.61 m/s AV Area (Vmean):   2.07 cm     PV Vmean:       40.600 cm/s AV Area (VTI):     2.15 cm     PV VTI:         0.145 m AV Vmax:           107.00 cm/s  PV Peak grad:   1.5 mmHg AV Vmean:          72.200 cm/s  PV Mean grad:   1.0 mmHg AV VTI:            0.230 m      RVOT Peak grad: 1 mmHg AV Peak Grad:      4.6 mmHg AV Mean Grad:      2.0 mmHg LVOT Vmax:         56.20 cm/s LVOT Vmean:        36.000 cm/s LVOT VTI:          0.119 m LVOT/AV VTI ratio: 0.52  AORTA Ao Root diam: 3.70 cm Ao Asc diam:  3.40 cm MITRAL VALVE               TRICUSPID VALVE MV Area (PHT): 2.16 cm    TR Peak grad:   15.4 mmHg MV Area VTI:   1.63 cm    TR Mean grad:   12.0 mmHg MV Peak grad:  2.7 mmHg    TR Vmax:        196.00 cm/s MV Mean grad:  1.0 mmHg    TR Vmean:       174.0 cm/s MV Vmax:       0.82 m/s MV Vmean:      41.3 cm/s   SHUNTS MV Decel Time: 352 msec    Systemic VTI:  0.12 m MV E velocity:  54.00 cm/s  Systemic Diam: 2.30 cm                            Pulmonic VTI:   0.107 m Evalene Lunger MD Electronically signed by Evalene Lunger MD Signature Date/Time: 06/12/2024/3:09:14 PM    Final    CT HEAD WO CONTRAST Result Date: 06/11/2024 CLINICAL DATA:  Head trauma unresponsive EXAM: CT HEAD WITHOUT CONTRAST TECHNIQUE: Contiguous axial images were obtained from the base of the skull through the vertex without intravenous contrast. RADIATION DOSE REDUCTION: This exam was performed according to the departmental dose-optimization program which includes automated exposure control, adjustment of the mA and/or kV according to patient size and/or use of iterative reconstruction technique. COMPARISON:  CT brain 06/23/2020 FINDINGS: Brain: No acute territorial infarction, hemorrhage or intracranial mass. Moderate atrophy. Stable ventricular size. Vascular: No hyperdense vessels.  Carotid vascular calcification Skull: Normal. Negative for fracture or focal lesion. Sinuses/Orbits: No acute finding. Other: None IMPRESSION: 1. No CT evidence for acute intracranial abnormality. 2. Atrophy. Electronically Signed   By: Luke Bun M.D.   On: 06/11/2024 18:23   CT Cervical Spine Wo Contrast Result Date: 06/11/2024 CLINICAL DATA:  Neck trauma fall, syncope EXAM: CT CERVICAL SPINE WITHOUT CONTRAST TECHNIQUE: Multidetector CT imaging of the cervical spine was performed without intravenous contrast. Multiplanar CT image reconstructions were also generated. RADIATION DOSE REDUCTION: This exam was performed according to the departmental dose-optimization program which includes automated exposure control, adjustment of the mA and/or kV according to patient size and/or use of iterative reconstruction technique. COMPARISON:  05/26/2020 FINDINGS: Alignment: Degenerative straightening of the normal cervical lordosis. Skull base and vertebrae: No acute fracture. No primary bone lesion or focal pathologic process. Soft tissues and spinal canal: No prevertebral fluid or swelling. No visible canal hematoma.  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 Upper chest: Negative. Other: None. IMPRESSION: 1. No fracture or static subluxation of the cervical spine. 2. 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 Electronically Signed   By: Marolyn JONETTA Jaksch M.D.   On: 06/11/2024 18:08   DG Chest Portable 1 View Result Date: 06/11/2024 CLINICAL DATA:  cp EXAM: PORTABLE CHEST - 1 VIEW COMPARISON:  None available. FINDINGS: Lower lung volumes. Trace left pleural effusion. No focal airspace consolidation or pneumothorax. Moderate cardiomegaly. Left chest pacemaker/AICD with a single lead terminating in the right ventricle. Sternotomy wires and CABG markers. Tortuous aorta with aortic atherosclerosis. No acute fracture or destructive lesions. Multilevel thoracic osteophytosis. IMPRESSION: Unchanged cardiomegaly.  Trace left pleural effusion. Electronically Signed   By: Rogelia Myers M.D.   On: 06/11/2024 17:14        Scheduled Meds:  amiodarone   400 mg Oral Daily   folic acid   1 mg Oral Daily   insulin  aspart  0-5 Units Subcutaneous QHS   insulin  aspart  0-9 Units Subcutaneous TID WC   insulin  glargine-yfgn  25 Units Subcutaneous Daily   levothyroxine   75 mcg Oral Q0600   loratadine   10 mg Oral QHS   metoprolol  succinate  25 mg Oral Daily   mexiletine  150 mg Oral Q12H   omega-3 acid ethyl esters  1 g Oral BID   rivaroxaban   15 mg Oral Q supper   rosuvastatin   20 mg Oral QHS   sodium chloride  flush  3 mL Intravenous Q12H   traZODone   50 mg Oral QHS   Continuous Infusions:  sodium chloride   LOS: 1 day     Devaughn KATHEE Ban, MD [facility]   If 7PM-7AM, please contact night-coverage [email or web address] Password [facility] 06/13/2024, 2:23 PM

## 2024-06-13 NOTE — Progress Notes (Signed)
  Patient Name: Andres CALDERWOOD Sr. Date of Encounter: 06/13/2024  Primary Cardiologist: Deatrice Cage, MD Electrophysiologist: Elspeth Sage, MD > Ole Holts  Interval Summary   NAEON, slept better in hospital room than in ER.  LHC this AM tolerated well. Having difficulty using L hand for ADLs.  He denies chest pain, chest pressure, or palpitaitons.    Vital Signs    Vitals:   06/13/24 1030 06/13/24 1100 06/13/24 1105 06/13/24 1145  BP: 105/61  (!) 105/58 (!) 94/57  Pulse: (!) 55 (!) 53    Resp: 16  (!) 21   Temp:      TempSrc:      SpO2: 96% 98% 99%   Weight:      Height:        Intake/Output Summary (Last 24 hours) at 06/13/2024 1238 Last data filed at 06/13/2024 0552 Gross per 24 hour  Intake 480 ml  Output 900 ml  Net -420 ml   Filed Weights   06/11/24 1657 06/13/24 0500  Weight: 96.6 kg 96.1 kg    Physical Exam    GEN- NAD, Alert and oriented  Lungs- Clear to ausculation bilaterally, normal work of breathing Cardiac- Irregularly irregular rate and rhythm, no murmurs, rubs or gallops GI- soft, NT, ND, + BS Extremities- no clubbing or cyanosis. No edema  Telemetry    AF with bradycardia, rates 50-60s (personally reviewed)  Hospital Course    Andres LITTIE Gobble Sr. is a 83 y.o. male with PMH of perm afib, ICM, VT s/p ICD, CAD s/p CABG, HFrEF, hypothyroid admitted for VT with ICD shock.  LHC earlier this morning with stable CAD, similar to previous LHC  Assessment & Plan     #) VT, VF #) ICM #) HFrEF #) CAD s/p CABG LHC with similar CAD from previous  VT quiescent on amiodarone  Transition IV amio to 400mg  amiodarone  daily Continue 150mg  mexiletine BID Continue 25mg  toprol  Device adjusted yesterday, reduced monitor zone, increased beats of ATP and more rounds of ATP  Per [place] DMV guidelines, no driving for 6 months   #) perm afib Resume xarelto  when appropriate per gen cards     EP will sign off at this time. If VT remains  quiescent, ok to discharge tomorrow  Continue 400mg  amiodarone  until follow-up Continue 150mg  mexiletine BID Outpatient EP appointment scheduled    For questions or updates, please contact [facility] Please consult [email or web address] for contact info under     Signed, Markiesha Delia, NP  06/13/2024, 12:38 PM

## 2024-06-13 NOTE — Progress Notes (Signed)
  Progress Note  Patient Name: Andres MAGES Sr. Date of Encounter: 06/13/2024 [facility] Cardiologist: Andres Cage, MD   Interval Summary   Patient evaluated this afternoon following catheterization.  He complains of some shortness of breath that began this afternoon.  He tried sitting up and feels like it is a little better.  No chest pain or palpitations.  Vital Signs Vitals:   06/13/24 1100 06/13/24 1105 06/13/24 1145 06/13/24 1525  BP:  (!) 105/58 (!) 94/57 102/74  Pulse: (!) 53   70  Resp:  (!) 21    Temp:    98.4 F (36.9 C)  TempSrc:    Oral  SpO2: 98% 99%  99%  Weight:      Height:        Intake/Output Summary (Last 24 hours) at 06/13/2024 1728 Last data filed at 06/13/2024 1433 Gross per 24 hour  Intake 720 ml  Output 1250 ml  Net -530 ml      06/13/2024    5:00 AM 06/11/2024    4:57 PM 01/18/2024    1:19 PM  Last 3 Weights  Weight (lbs) 211 lb 13.8 oz 213 lb 215 lb 2 oz  Weight (kg) 96.1 kg 96.616 kg 97.58 kg      Telemetry/ECG  Atrial fibrillation with slow ventricular response - Personally Reviewed  Physical Exam  GEN: No acute distress.   Neck: No JVD Cardiac: Irregularly irregular rhythm without murmurs.  Right radial arteriotomy site without hematoma or significant bruising. Respiratory: Faint bibasilar crackles. GI: Soft, nontender, non-distended  MS: No edema  Assessment & Plan  Recurrent ventricular tachycardia: Patient has been electrically quiescent on IV amiodarone  that was switched to oral amiodarone  today.  Appreciate EP recommendations.  Will plan to continue amiodarone  400 mg daily and mexiletine.  Device changes made yesterday to a lower detection zone for VT.  Acute on chronic HFrEF: Patient has known LVEF of 20-25%.  LVEDP at the time of this morning's catheterization was normal.  However, [name] complains of increasing shortness of breath this afternoon and has bibasilar crackles.  I will give furosemide  20 mg IV  x 1.  His volume status will need to be reassessed in the morning to determine if home dose of furosemide  20 mg daily should be resumed or if further escalation is indicated.  Permanent atrial fibrillation: Slow ventricular response noted in the absence of AV nodal blocking agents.  Continue amiodarone  and mexiletine, primarily used for VT.  Okay to resume rivaroxaban  this evening as there does not appear to be any bleeding or vascular injury from today's catheterization.  Coronary artery disease: No angina reported.  Catheterization today showed stable severe single-vessel CAD with heavily diseased distal LCx/OM3 with chronically occluded SVG-OM3.  LAD and RCA showed stable mild-moderate disease.  Continue secondary prevention with rosuvastatin .  For questions or updates, please contact  [facility] Please consult [email or web address] for contact info under [name].     Signed, Andres Hanson, MD

## 2024-06-13 NOTE — Interval H&P Note (Signed)
 History and Physical Interval Note:  06/13/2024 8:48 AM  [name]  has presented today for surgery, with the diagnosis of ventricular tachycardia.  The various methods of treatment have been discussed with the patient and family. After consideration of risks, benefits and other options for treatment, the patient has consented to  Procedure(s): LEFT HEART CATH AND CORONARY ANGIOGRAPHY (N/A) as a surgical intervention.  The patient's history has been reviewed, patient examined, no change in status, stable for surgery.  I have reviewed the patient's chart and labs.  Questions were answered to the patient's satisfaction.    Cath Lab Visit (complete for each Cath Lab visit)  Clinical Evaluation Leading to the Procedure:   ACS: Yes.    Non-ACS:  N/A  [name]

## 2024-06-14 DIAGNOSIS — R55 Syncope and collapse: Secondary | ICD-10-CM | POA: Diagnosis not present

## 2024-06-14 DIAGNOSIS — I472 Ventricular tachycardia, unspecified: Secondary | ICD-10-CM | POA: Diagnosis not present

## 2024-06-14 DIAGNOSIS — I5023 Acute on chronic systolic (congestive) heart failure: Secondary | ICD-10-CM | POA: Diagnosis not present

## 2024-06-14 LAB — BASIC METABOLIC PANEL WITH GFR
Anion gap: 10 (ref 5–15)
BUN: 39 mg/dL — ABNORMAL HIGH (ref 8–23)
CO2: 25 mmol/L (ref 22–32)
Calcium: 9.1 mg/dL (ref 8.9–10.3)
Chloride: 103 mmol/L (ref 98–111)
Creatinine, Ser: 1.91 mg/dL — ABNORMAL HIGH (ref 0.61–1.24)
GFR, Estimated: 34 mL/min — ABNORMAL LOW (ref >60)
Glucose, Bld: 141 mg/dL — ABNORMAL HIGH (ref 70–99)
Potassium: 4.2 mmol/L (ref 3.5–5.1)
Sodium: 138 mmol/L (ref 135–145)

## 2024-06-14 LAB — LIPID PANEL
Cholesterol: 143 mg/dL (ref 0–200)
HDL: 31 mg/dL — ABNORMAL LOW (ref >40)
LDL Cholesterol: 86 mg/dL (ref 0–99)
Total CHOL/HDL Ratio: 4.6 ratio
Triglycerides: 129 mg/dL (ref <150)
VLDL: 26 mg/dL (ref 0–40)

## 2024-06-14 LAB — GLUCOSE, CAPILLARY
Glucose-Capillary: 195 mg/dL — ABNORMAL HIGH (ref 70–99)
Glucose-Capillary: 205 mg/dL — ABNORMAL HIGH (ref 70–99)

## 2024-06-14 MED ORDER — LEVOTHYROXINE SODIUM 75 MCG PO TABS: 75.0000 ug | ORAL_TABLET | Freq: Every day | ORAL | 1 refills | Status: AC

## 2024-06-14 MED ORDER — MEXILETINE HCL 150 MG PO CAPS: 150.0000 mg | ORAL_CAPSULE | Freq: Two times a day (BID) | ORAL | 1 refills | Status: AC

## 2024-06-14 MED ORDER — AMIODARONE HCL 400 MG PO TABS: 400.0000 mg | ORAL_TABLET | Freq: Every day | ORAL | 1 refills | Status: DC

## 2024-06-14 NOTE — Progress Notes (Signed)
 Progress Note  Patient Name: Andres AMSDEN Sr. Date of Encounter: 06/14/2024  Primary Cardiologist: Darron Primary Electrophysiologist: Fernande  Subjective   No further VT following IV amiodarone , addition of mexiletine, and continuation of Toprol . Feels well this morning. No chest pain, dyspnea, palpitations, dizziness, presyncope, or syncope. No device alarms or discharges.   Inpatient Medications    Scheduled Meds:  amiodarone   400 mg Oral Daily   folic acid   1 mg Oral Daily   insulin  aspart  0-5 Units Subcutaneous QHS   insulin  aspart  0-9 Units Subcutaneous TID WC   insulin  glargine-yfgn  25 Units Subcutaneous Daily   levothyroxine   75 mcg Oral Q0600   loratadine   10 mg Oral QHS   metoprolol  succinate  25 mg Oral Daily   mexiletine  150 mg Oral Q12H   omega-3 acid ethyl esters  1 g Oral BID   rivaroxaban   15 mg Oral Q supper   rosuvastatin   20 mg Oral QHS   sodium chloride  flush  3 mL Intravenous Q12H   traZODone   50 mg Oral QHS   Continuous Infusions:  sodium chloride      PRN Meds: sodium chloride , acetaminophen , dextrose , hydrALAZINE , ondansetron  (ZOFRAN ) IV, sodium chloride  flush, traZODone    Vital Signs    Vitals:   06/13/24 1927 06/14/24 0027 06/14/24 0431 06/14/24 0500  BP: 96/67 99/61 114/64   Pulse: 93 (!) 54 67   Resp: 20 18 17    Temp: 97.7 F (36.5 C) (!) 97.5 F (36.4 C) 97.8 F (36.6 C)   TempSrc:      SpO2: 97% 96% 96%   Weight:    93.9 kg  Height:        Intake/Output Summary (Last 24 hours) at 06/14/2024 0900 Last data filed at 06/14/2024 0400 Gross per 24 hour  Intake 660 ml  Output 1800 ml  Net -1140 ml   Filed Weights   06/11/24 1657 06/13/24 0500 06/14/24 0500  Weight: 96.6 kg 96.1 kg 93.9 kg    Telemetry    Afib with occasional multifocal PVCs vs aberrancy, 50s to 60s bpm - Personally Reviewed  ECG    No new tracings - Personally Reviewed  Physical Exam   GEN: No acute distress.   Neck: No JVD. Cardiac: IRIR, no  murmurs, rubs, or gallops. Right radial arteriotomy site without bleeding, hematoma, swelling, warmth, or TTP. Respiratory: Clear to auscultation bilaterally.  GI: Soft, nontender, non-distended.   MS: No edema; No deformity. Neuro:  Alert and oriented x 3; Nonfocal.  Psych: Normal affect.  Labs    Chemistry Recent Labs  Lab 06/11/24 1655 06/12/24 0204 06/12/24 1146 06/13/24 0408 06/14/24 0446  NA 140   < > 137 139 138  K 3.7   < > 4.8 4.3 4.2  CL 107   < > 105 105 103  CO2 20*   < > 25 25 25   GLUCOSE 68*   < > 280* 130* 141*  BUN 39*   < > 31* 35* 39*  CREATININE 1.90*   < > 1.67* 1.62* 1.91*  CALCIUM  9.3   < > 8.9 9.1 9.1  PROT 7.2  --   --   --   --   ALBUMIN 3.9  --   --   --   --   AST 28  --   --   --   --   ALT 17  --   --   --   --   [facility]  30*  --   --   --   --   BILITOT 0.8  --   --   --   --   GFRNONAA 35*   < > 40* 42* 34*  ANIONGAP 13   < > 7 9 10    < > = values in this interval not displayed.     Hematology Recent Labs  Lab 06/11/24 1655 06/12/24 0359  WBC 9.1 8.9  RBC 4.28 4.22  HGB 14.1 13.9  HCT 42.0 42.2  MCV 98.1 100.0  MCH 32.9 32.9  MCHC 33.6 32.9  RDW 14.7 14.8  PLT 166 160    Cardiac EnzymesNo results for input(s): TROPONINI in the last 168 hours. No results for input(s): TROPIPOC in the last 168 hours.   BNP Recent Labs  Lab 06/11/24 1655  BNP 425.4*     DDimer No results for input(s): DDIMER in the last 168 hours.   Radiology      Cardiac Studies   LHC 06/13/2024: Conclusions: Stable severe single vessel coronary artery disease involving distal LCx/OM3, similar to last catheterization.  Non-obstructive disease noted in LAD and RCA. Chronically occluded SVG-OM3. Normal left ventricular filling pressure (LVEDP 10 mmHg).   Recommendations: Continue secondary prevention of coronary artery disease. Continue GDMT for chronic HFrEF, as tolerated. 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 __________  2D echo  06/12/2024: 1. Left ventricular ejection fraction, by estimation, is 20 to 25%. Left  ventricular ejection fraction by 2D MOD biplane is 27.2 %. The left  ventricle has severely decreased function. The left ventricle demonstrates  global hypokinesis. The left  ventricular internal cavity size was moderately dilated. Left ventricular  diastolic parameters are indeterminate.   2. Right ventricular systolic function is normal. The right ventricular  size is mildly enlarged. There is normal pulmonary artery systolic  pressure. The estimated right ventricular systolic pressure is 20.4 mmHg.   3. Left atrial size was moderately dilated.   4. Right atrial size was mildly dilated.   5. The mitral valve is normal in structure. No evidence of mitral valve  regurgitation. No evidence of mitral stenosis.   6. Tricuspid valve regurgitation is mild to moderate.   7. The aortic valve is normal in structure. Aortic valve regurgitation is  not visualized. No aortic stenosis is present.   8. The inferior vena cava is normal in size with greater than 50%  respiratory variability, suggesting right atrial pressure of 3 mmHg.   Patient Profile     83 y.o. 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   Assessment & Plan    1. Recurrent VT s/p ICD: -Admitted this admission with syncopal episode.  By report, patient was in VT when EMS arrived, and rhythm broke with vagal maneuvers. EMS run sheet not available for review -Echo this admission showed an EF of 20-25% (previously 25-30% in 2022) -LHC this admission showed stable severe single-vessel CAD with heavily diseased distal LCx/OM3 with chronically occluded SVG-OM3. LAD and RCA showed  stable mild-moderate disease  -Managed by EP with device adjusted, reduced monitor zone, increased beats of ATP and more rounds of ATP -No further episodes of VT following IV amiodarone  and addition of mexiletine with continuation  of Toprol  -Continue oral amiodarone  400 mg daily, mexiletine 150 gm bid, and Toprol  XL 25 mg daily -Potassium and magnesium  at goal -Per [place] DMV guidelines, no driving for 6 months -Follow up with EP, scheduled   2. CAD s/p CABG: -No angina reported -LHC 06/13/2024 showed stable severe single-vessel CAD with heavily diseased distal LCx/OM3 with chronically occluded SVG-OM3. LAD and RCA showed stable mild-moderate disease -Continue secondary prevention with rosuvastatin   -On Xarelto  in place of ASA given Afib -Post cath instructions  3. Permanent Afib with slow ventricular response: -Asymptomatic  -EP has stated they are ok with his bradycardic rates -Remains on Toprol  XL -Xarelto  15 mg, CrCl 38.5 -Follow up with EP  4. HFrEF: -Felt to be mixed ICM and NICM -Euvolemic -Toprol  XL (will need to hold this morning due to soft BP) -Soft BP precludes escalation of GDMT, resume as able moving forward -Previously did not tolerate [place] due to dizziness  5. Hypotension: -Asymptomatic, reports BP has been running around his baseline -Hold Toprol  XL for systolic BP < 100 mmHg      For questions or updates, please contact [facility] Please consult [email or web address] for contact info under Cardiology/STEMI.    Signed, Bernardino Bring, PA-C [facility] Pager: [phone] 06/14/2024, 9:00 AM

## 2024-06-14 NOTE — Care Management Important Message (Signed)
 Important Message  Patient Details  Name: Andres GUERRINI Sr. MRN: [id number] Date of Birth: 1941-02-12   Important Message Given:  Yes - Medicare IM     Andres [name] 06/14/2024, 2:17 PM

## 2024-06-14 NOTE — Plan of Care (Signed)

## 2024-06-14 NOTE — Discharge Instructions (Signed)
 Hold metoprolol  for blood pressures less than 90 systolic

## 2024-06-14 NOTE — Discharge Summary (Signed)
 Andres LITTIE Gobble Sr. FMW:[id number] DOB: Apr 24, 1941 DOA: 06/11/2024  PCP: [name], MD  Admit date: 06/11/2024 Discharge date: 06/14/2024  Time spent: 35 minutes  Recommendations for Outpatient Follow-up:  Chf clinic f/u 7/24 as scheduled  Check thyroid  function in 4-6 weeks    Discharge Diagnoses:  Principal Problem:   Ventricular tachycardia (HCC) Active Problems:   Unresponsiveness   Ischemic cardiomyopathy   Acute on chronic HFrEF (heart failure with reduced ejection fraction) (HCC)   Myocardial injury   Coronary artery disease   Hypothyroidism   Hyperlipidemia   Type II diabetes mellitus with renal manifestations (HCC)   Chronic kidney disease, stage 3b (HCC)   Obesity (BMI 30-39.9)   Automatic implantable cardioverter-defibrillator in situ   Permanent atrial fibrillation (HCC)   Syncope and collapse   Benign essential hypertension   PMR (polymyalgia rheumatica) (HCC)   Sleep apnea   Atrial fibrillation, chronic (HCC)   Defibrillator discharge   Elevated troponin   Discharge Condition: stable  Diet recommendation: heart healthy fluid restrict  Filed Weights   06/11/24 1657 06/13/24 0500 06/14/24 0500  Weight: 96.6 kg 96.1 kg 93.9 kg    History of present illness:  From admission h and p Andres L Wileman Sr. is a 83 y.o. male with medical history significant of A-fib on Xarelto , V. tach (s/p of AICD), HLD, DM,CAD, sCHF with EF 20-30%, hypothyroidism, CKD-3b, PMR, kidney stone, SDH, who presents with unresponsiveness.   Per family report, pt was out working in the garden and then came inside and went unresponsive. Daughter witnessed pt go unresponsive. Daughter reported to EMS that she witnessed him get defibrillated twice. On EMS arrival, pt was alert and oriented x4. Per EMS, pt was in [place] on arrival. EMS did vagal maneuver and converted pt to Afib. EDP consulted Dr. Darliss of card. Pt was started on amiodarone  drip in ED.  While I saw patient in  the ED, he is asymptomatic, denies chest pain, cough, SOB.  No nausea, vomiting, diarrhea or abdominal pain.  No symptoms of UTI.  No fever or chills.  His heart rate is in the 70s.  Hospital Course:   Patient presents with syncopal event at home secondary to recurrent v-tach. EP evaluated, adjusted ICD parameters. Also started amiodarone  and mexiletine. No recurrent episodes here. Stable but soft pressures. TTE shows progression os hfref, ef 20-25 previously 25-30. Home entresto  and epleronone held 2/2 hypotension and hyperkalemia. Metoprolol  continued but with hypotension hold parameters. LHC obtained which showed stable severe 1-vessel CAD (lcx/om3). Patient feels well and feels ready to discharge. Other chronic medical problems stable though TSH quite elevated, patient reveals he's been out of his levothyroxine  for about 2 months. We have re-started it, will need check of TFTs in 4-6 wks. Cleared by cardiology for discharge, has f/u in chf clinic next week.  Procedures: LHC   Consultations: Cardiology, EP  Discharge Exam: Vitals:   06/14/24 0910 06/14/24 1159  BP: (!) 86/49 102/65  Pulse: (!) 59 68  Resp:    Temp: 97.8 F (36.6 C)   SpO2: 98% 96%    General: NAD Cardiovascular: distant heart sounds, soft murmur Respiratory: faint bibasilar rales otherwise clear Ext: warm, no edema  Discharge Instructions   Discharge Instructions     Diet - low sodium heart healthy   Complete by: As directed    Increase activity slowly   Complete by: As directed    No wound care   Complete by: As directed  Allergies as of 06/14/2024   No Known Allergies      Medication List     PAUSE taking these medications    Entresto  24-26 MG Wait to take this until your doctor or other care provider tells you to start again. Generic drug: sacubitril -valsartan  Take 1 tablet by mouth 2 (two) times daily.   eplerenone  25 MG tablet Wait to take this until your doctor or other care  provider tells you to start again. Commonly known as: INSPRA  TAKE ONE-HALF TABLET BY MOUTH  DAILY       TAKE these medications    amiodarone  400 MG tablet Commonly known as: PACERONE  Take 1 tablet (400 mg total) by mouth daily. Start taking on: June 15, 2024   Cinnamon  500 MG Tabs Take 1,000 mg by mouth 2 (two) times daily.   CO Q 10 PO Take 300 mg by mouth daily.   Dexcom G7 Sensor Misc USE 1 SENSOR EVERY 10 DAYS   FISH OIL PO Take 1 tablet by mouth 2 (two) times daily.   folic acid  1 MG tablet Commonly known as: FOLVITE  Take 1 mg by mouth daily.   furosemide  20 MG tablet Commonly known as: LASIX  Take 20 mg by mouth daily. Added by PCP.   Garlic  1000 MG Caps Take 1 capsule by mouth daily.   GLUCOSAMINE CHONDR 1500 COMPLX PO Take 1,500 mg by mouth 2 (two) times daily.   levocetirizine 5 MG tablet Commonly known as: XYZAL  Take 5 mg by mouth every evening.   levothyroxine  75 MCG tablet Commonly known as: SYNTHROID  Take 1 tablet (75 mcg total) by mouth daily at 6 (six) AM. Start taking on: June 15, 2024   metoprolol  succinate 25 MG 24 hr tablet Commonly known as: TOPROL -XL TAKE 1 TABLET BY MOUTH AT  BEDTIME   mexiletine 150 MG capsule Commonly known as: MEXITIL  Take 1 capsule (150 mg total) by mouth every 12 (twelve) hours.   rosuvastatin  20 MG tablet Commonly known as: CRESTOR  Take 20 mg by mouth at bedtime.   traZODone  50 MG tablet Commonly known as: DESYREL  Take 1 tablet by mouth at bedtime.   Tresiba FlexTouch 200 UNIT/ML FlexTouch Pen Generic drug: insulin  degludec SMARTSIG:90 Unit(s) SUB-Q Every Night   Xarelto  15 MG Tabs tablet Generic drug: Rivaroxaban  TAKE 1 TABLET BY MOUTH ONCE  DAILY WITH A MEAL       No Known Allergies    The results of significant diagnostics from this hospitalization (including imaging, microbiology, ancillary and laboratory) are listed below for reference.    Significant Diagnostic Studies: CARDIAC  CATHETERIZATION Result Date: 06/13/2024 Conclusions: Stable severe single vessel coronary artery disease involving distal LCx/OM3, similar to last catheterization.  Non-obstructive disease noted in LAD and RCA. Chronically occluded SVG-OM3. Normal left ventricular filling pressure (LVEDP 10 mmHg).  Recommendations: Continue secondary prevention of coronary artery disease. Continue GDMT for chronic HFrEF, as tolerated. 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 Lonni Hanson, MD [facility]  ECHOCARDIOGRAM COMPLETE Result Date: 06/12/2024    ECHOCARDIOGRAM REPORT   Patient Name:   SR. Andres LITTIE Gobble Sr. Date of Exam: 06/12/2024 Medical Rec #:  [id number]                 Height:       70.0 in Accession #:    [id number]                Weight:       213.0 lb Date of Birth:  1941/01/14                 BSA:          2.144 m Patient Age:    82 years                  BP:           106/58 mmHg Patient Gender: M                         HR:           58 bpm. Exam Location:  [facility] Procedure: 2D Echo, Cardiac Doppler, Color Doppler and Intracardiac            Opacification Agent (Both Spectral and Color Flow Doppler were            utilized during procedure). Indications:     Ventricular Tachycardia I47.2  History:         Patient has prior history of Echocardiogram examinations, most                  recent 02/22/2021. CHF; Abnormal ECG and Pacemaker.  Sonographer:     Ashley McNeely-Sloane Referring Phys:  [id number] RIDDLE Diagnosing Phys: Evalene Lunger MD IMPRESSIONS  1. Left ventricular ejection fraction, by estimation, is 20 to 25%. Left ventricular ejection fraction by 2D MOD biplane is 27.2 %. The left ventricle has severely decreased function. The left ventricle demonstrates global hypokinesis. The left ventricular internal cavity size was moderately dilated. Left ventricular diastolic parameters are indeterminate.  2. Right ventricular systolic function is normal. The right ventricular size is mildly  enlarged. There is normal pulmonary artery systolic pressure. The estimated right ventricular systolic pressure is 20.4 mmHg.  3. Left atrial size was moderately dilated.  4. Right atrial size was mildly dilated.  5. The mitral valve is normal in structure. No evidence of mitral valve regurgitation. No evidence of mitral stenosis.  6. Tricuspid valve regurgitation is mild to moderate.  7. The aortic valve is normal in structure. Aortic valve regurgitation is not visualized. No aortic stenosis is present.  8. The inferior vena cava is normal in size with greater than 50% respiratory variability, suggesting right atrial pressure of 3 mmHg. FINDINGS  Left Ventricle: Left ventricular ejection fraction, by estimation, is 20 to 25%. Left ventricular ejection fraction by 2D MOD biplane is 27.2 %. The left ventricle has severely decreased function. The left ventricle demonstrates global hypokinesis. Definity  contrast agent was given IV to delineate the left ventricular endocardial borders. Strain was performed and the global longitudinal strain is indeterminate. The left ventricular internal cavity size was moderately dilated. There is no left ventricular hypertrophy. Left ventricular diastolic parameters are indeterminate. Right Ventricle: The right ventricular size is mildly enlarged. No increase in right ventricular wall thickness. Right ventricular systolic function is normal. There is normal pulmonary artery systolic pressure. The tricuspid regurgitant velocity is 1.96  m/s, and with an assumed right atrial pressure of 5 mmHg, the estimated right ventricular systolic pressure is 20.4 mmHg. Left Atrium: Left atrial size was moderately dilated. Right Atrium: Right atrial size was mildly dilated. Pericardium: Trivial pericardial effusion is present. Mitral Valve: The mitral valve is normal in structure. No evidence of mitral valve regurgitation. No evidence of mitral valve stenosis. MV peak gradient, 2.7 mmHg. The mean  mitral valve gradient is 1.0 mmHg. Tricuspid Valve: The tricuspid valve is normal in structure. Tricuspid valve  regurgitation is mild to moderate. No evidence of tricuspid stenosis. Aortic Valve: The aortic valve is normal in structure. Aortic valve regurgitation is not visualized. No aortic stenosis is present. Aortic valve mean gradient measures 2.0 mmHg. Aortic valve peak gradient measures 4.6 mmHg. Aortic valve area, by VTI measures 2.15 cm. Pulmonic Valve: The pulmonic valve was normal in structure. Pulmonic valve regurgitation is not visualized. No evidence of pulmonic stenosis. Aorta: The aortic root is normal in size and structure. Venous: The inferior vena cava is normal in size with greater than 50% respiratory variability, suggesting right atrial pressure of 3 mmHg. IAS/Shunts: No atrial level shunt detected by color flow Doppler. Additional Comments: 3D was performed not requiring image post processing on an independent workstation and was indeterminate. A device lead is visualized.  LEFT VENTRICLE PLAX 2D                        Biplane EF (MOD) LVIDd:         6.20 cm         LV Biplane EF:   Left LVIDs:         6.20 cm                          ventricular LV PW:         1.20 cm                          ejection LV IVS:        0.90 cm                          fraction by LVOT diam:     2.30 cm                          2D MOD LV SV:         49                               biplane is LV SV Index:   23                               27.2 %. LVOT Area:     4.15 cm                                Diastology                                LV e' medial:    8.03 cm/s LV Volumes (MOD)               LV E/e' medial:  6.7 LV vol d, MOD    97.1 ml       LV e' lateral:   9.83 cm/s A2C:                           LV E/e' lateral: 5.5 LV vol d, MOD    91.6 ml A4C: LV vol s, MOD    60.6 ml A2C: LV vol s, MOD  81.1 ml A4C: LV SV MOD A2C:   36.5 ml LV SV MOD A4C:   91.6 ml LV SV MOD BP:    27.1 ml RIGHT VENTRICLE RV  Basal diam:  5.50 cm RV Mid diam:    4.20 cm RV S prime:     10.10 cm/s TAPSE (M-mode): 1.7 cm LEFT ATRIUM            Index        RIGHT ATRIUM           Index LA diam:      4.80 cm  2.24 cm/m   RA Area:     31.10 cm LA Vol (A4C): 145.0 ml 67.63 ml/m  RA Volume:   102.00 ml 47.58 ml/m  AORTIC VALVE                    PULMONIC VALVE AV Area (Vmax):    2.18 cm     PV Vmax:        0.61 m/s AV Area (Vmean):   2.07 cm     PV Vmean:       40.600 cm/s AV Area (VTI):     2.15 cm     PV VTI:         0.145 m AV Vmax:           107.00 cm/s  PV Peak grad:   1.5 mmHg AV Vmean:          72.200 cm/s  PV Mean grad:   1.0 mmHg AV VTI:            0.230 m      RVOT Peak grad: 1 mmHg AV Peak Grad:      4.6 mmHg AV Mean Grad:      2.0 mmHg LVOT Vmax:         56.20 cm/s LVOT Vmean:        36.000 cm/s LVOT VTI:          0.119 m LVOT/AV VTI ratio: 0.52  AORTA Ao Root diam: 3.70 cm Ao Asc diam:  3.40 cm MITRAL VALVE               TRICUSPID VALVE MV Area (PHT): 2.16 cm    TR Peak grad:   15.4 mmHg MV Area VTI:   1.63 cm    TR Mean grad:   12.0 mmHg MV Peak grad:  2.7 mmHg    TR Vmax:        196.00 cm/s MV Mean grad:  1.0 mmHg    TR Vmean:       174.0 cm/s MV Vmax:       0.82 m/s MV Vmean:      41.3 cm/s   SHUNTS MV Decel Time: 352 msec    Systemic VTI:  0.12 m MV E velocity: 54.00 cm/s  Systemic Diam: 2.30 cm                            Pulmonic VTI:  0.107 m Evalene Lunger MD Electronically signed by Evalene Lunger MD Signature Date/Time: 06/12/2024/3:09:14 PM    Final    CT HEAD WO CONTRAST Result Date: 06/11/2024 CLINICAL DATA:  Head trauma unresponsive EXAM: CT HEAD WITHOUT CONTRAST TECHNIQUE: Contiguous axial images were obtained from the base of the skull through the vertex without intravenous contrast. RADIATION DOSE REDUCTION: This exam was performed according to the departmental dose-optimization program which  includes automated exposure control, adjustment of the mA and/or kV according to patient size and/or use of  iterative reconstruction technique. COMPARISON:  CT brain 06/23/2020 FINDINGS: Brain: No acute territorial infarction, hemorrhage or intracranial mass. Moderate atrophy. Stable ventricular size. Vascular: No hyperdense vessels.  Carotid vascular calcification Skull: Normal. Negative for fracture or focal lesion. Sinuses/Orbits: No acute finding. Other: None IMPRESSION: 1. No CT evidence for acute intracranial abnormality. 2. Atrophy. Electronically Signed   By: Luke Bun M.D.   On: 06/11/2024 18:23   CT Cervical Spine Wo Contrast Result Date: 06/11/2024 CLINICAL DATA:  Neck trauma fall, syncope EXAM: CT CERVICAL SPINE WITHOUT CONTRAST TECHNIQUE: Multidetector CT imaging of the cervical spine was performed without intravenous contrast. Multiplanar CT image reconstructions were also generated. RADIATION DOSE REDUCTION: This exam was performed according to the departmental dose-optimization program which includes automated exposure control, adjustment of the mA and/or kV according to patient size and/or use of iterative reconstruction technique. COMPARISON:  05/26/2020 FINDINGS: Alignment: Degenerative straightening of the normal cervical lordosis. Skull base and vertebrae: No acute fracture. No primary bone lesion or focal pathologic process. Soft tissues and spinal canal: No prevertebral fluid or swelling. No visible canal hematoma. 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 Upper chest: Negative. Other: None. IMPRESSION: 1. No fracture or static subluxation of the cervical spine. 2. 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 Electronically Signed   By: Marolyn JONETTA Jaksch M.D.   On: 06/11/2024 18:08   DG Chest Portable 1 View Result Date: 06/11/2024 CLINICAL DATA:  cp EXAM: PORTABLE CHEST - 1 VIEW COMPARISON:  None available. FINDINGS: Lower lung volumes. Trace left pleural effusion. No focal airspace consolidation or  pneumothorax. Moderate cardiomegaly. Left chest pacemaker/AICD with a single lead terminating in the right ventricle. Sternotomy wires and CABG markers. Tortuous aorta with aortic atherosclerosis. No acute fracture or destructive lesions. Multilevel thoracic osteophytosis. IMPRESSION: Unchanged cardiomegaly.  Trace left pleural effusion. Electronically Signed   By: Rogelia Myers M.D.   On: 06/11/2024 17:14   CUP PACEART REMOTE DEVICE CHECK Result Date: 05/20/2024 ICD Scheduled remote reviewed. Normal device function.  Presenting rhythm:  VS, irregular R-R, occasional VP and PVC.  HF diagnostics have been abnormal in this monitoring period. Next remote 91 days. - CS, CVRS   Microbiology: No results found for this or any previous visit (from the past 240 hours).   Labs: Basic Metabolic Panel: Recent Labs  Lab 06/11/24 1655 06/12/24 0204 06/12/24 1146 06/13/24 0408 06/14/24 0446  NA 140 137 137 139 138  K 3.7 5.2* 4.8 4.3 4.2  CL 107 104 105 105 103  CO2 20* 24 25 25 25   GLUCOSE 68* 236* 280* 130* 141*  BUN 39* 36* 31* 35* 39*  CREATININE 1.90* 2.10* 1.67* 1.62* 1.91*  CALCIUM  9.3 8.9 8.9 9.1 9.1  MG 2.1  --   --   --   --    Liver Function Tests: Recent Labs  Lab 06/11/24 1655  AST 28  ALT 17  ALKPHOS 30*  BILITOT 0.8  PROT 7.2  ALBUMIN 3.9   No results for input(s): LIPASE, AMYLASE in the last 168 hours. No results for input(s): AMMONIA in the last 168 hours. CBC: Recent Labs  Lab 06/11/24 1655 06/12/24 0359  WBC 9.1 8.9  NEUTROABS 4.8  --   HGB 14.1 13.9  HCT 42.0 42.2  MCV 98.1 100.0  PLT 166 160   Cardiac Enzymes: No results for input(s): CKTOTAL,  CKMB, CKMBINDEX, TROPONINI in the last 168 hours. BNP: BNP (last 3 results) Recent Labs    11/01/23 1459 11/21/23 0750 06/11/24 1655  BNP 393.7* 525.7* 425.4*    ProBNP (last 3 results) No results for input(s): PROBNP in the last 8760 hours.  CBG: Recent Labs  Lab 06/13/24 1230  06/13/24 1659 06/13/24 2051 06/14/24 0911 06/14/24 1200  GLUCAP 292* 189* 241* 195* 205*       Signed:  Devaughn KATHEE Ban MD.  [facility] 06/14/2024, 12:31 PM

## 2024-06-16 ENCOUNTER — Encounter: Payer: Self-pay | Admitting: Internal Medicine

## 2024-06-17 LAB — LIPOPROTEIN A (LPA): Lipoprotein (a): 97.8 nmol/L — ABNORMAL HIGH (ref <75.0)

## 2024-06-19 ENCOUNTER — Encounter: Admitting: Family

## 2024-06-19 ENCOUNTER — Ambulatory Visit: Attending: Family | Admitting: Family

## 2024-06-19 VITALS — BP 132/76 | HR 63 | Temp 97.5°F | Ht 70.0 in | Wt 217.0 lb

## 2024-06-19 DIAGNOSIS — I255 Ischemic cardiomyopathy: Secondary | ICD-10-CM

## 2024-06-19 DIAGNOSIS — M353 Polymyalgia rheumatica: Secondary | ICD-10-CM | POA: Insufficient documentation

## 2024-06-19 DIAGNOSIS — N189 Chronic kidney disease, unspecified: Secondary | ICD-10-CM | POA: Insufficient documentation

## 2024-06-19 DIAGNOSIS — I472 Ventricular tachycardia, unspecified: Secondary | ICD-10-CM | POA: Insufficient documentation

## 2024-06-19 DIAGNOSIS — R609 Edema, unspecified: Secondary | ICD-10-CM | POA: Diagnosis not present

## 2024-06-19 DIAGNOSIS — E039 Hypothyroidism, unspecified: Secondary | ICD-10-CM | POA: Insufficient documentation

## 2024-06-19 DIAGNOSIS — E1122 Type 2 diabetes mellitus with diabetic chronic kidney disease: Secondary | ICD-10-CM | POA: Diagnosis not present

## 2024-06-19 DIAGNOSIS — Z79899 Other long term (current) drug therapy: Secondary | ICD-10-CM | POA: Diagnosis not present

## 2024-06-19 DIAGNOSIS — I25708 Atherosclerosis of coronary artery bypass graft(s), unspecified, with other forms of angina pectoris: Secondary | ICD-10-CM

## 2024-06-19 DIAGNOSIS — Z95810 Presence of automatic (implantable) cardiac defibrillator: Secondary | ICD-10-CM | POA: Insufficient documentation

## 2024-06-19 DIAGNOSIS — E785 Hyperlipidemia, unspecified: Secondary | ICD-10-CM | POA: Insufficient documentation

## 2024-06-19 DIAGNOSIS — I2510 Atherosclerotic heart disease of native coronary artery without angina pectoris: Secondary | ICD-10-CM | POA: Diagnosis not present

## 2024-06-19 DIAGNOSIS — Z87891 Personal history of nicotine dependence: Secondary | ICD-10-CM | POA: Diagnosis not present

## 2024-06-19 DIAGNOSIS — R42 Dizziness and giddiness: Secondary | ICD-10-CM | POA: Diagnosis present

## 2024-06-19 DIAGNOSIS — Z951 Presence of aortocoronary bypass graft: Secondary | ICD-10-CM | POA: Insufficient documentation

## 2024-06-19 DIAGNOSIS — I482 Chronic atrial fibrillation, unspecified: Secondary | ICD-10-CM | POA: Insufficient documentation

## 2024-06-19 DIAGNOSIS — I10 Essential (primary) hypertension: Secondary | ICD-10-CM | POA: Diagnosis not present

## 2024-06-19 DIAGNOSIS — I5022 Chronic systolic (congestive) heart failure: Secondary | ICD-10-CM | POA: Insufficient documentation

## 2024-06-19 DIAGNOSIS — I130 Hypertensive heart and chronic kidney disease with heart failure and stage 1 through stage 4 chronic kidney disease, or unspecified chronic kidney disease: Secondary | ICD-10-CM | POA: Diagnosis not present

## 2024-06-19 DIAGNOSIS — G4733 Obstructive sleep apnea (adult) (pediatric): Secondary | ICD-10-CM | POA: Diagnosis not present

## 2024-06-19 DIAGNOSIS — E782 Mixed hyperlipidemia: Secondary | ICD-10-CM

## 2024-06-19 NOTE — Progress Notes (Unsigned)
 Advanced Heart Failure Clinic Note   Referring Physician: admission PCP: Andres Lame, MD Cardiologist: Deatrice Cage, MD   Chief Complaint: dizziness   HPI:  Mr Andres [name] is a 83 y/o male with a history of CKD, CHF, coronary disease, VT (AICD), hypertension, A-fib on Xarelto , T2DM, hx of SDH, HLD, polymyalgia rheumatica, OSA and hypothyroidism. Underwent 1 vessel CABG in 1994 after failed angioplasty on left circumflex complicated by dissection. ICD generator replacement in April 2019.   He was hospitalized in June 2021 with syncope and was found to have ventricular tachycardia treated with ATP. CT scan showed subdural hematoma which necessitated holding Xarelto  at that time. Recurrent ventricular tachycardia in early 2022   Admitted 06/11/24 with unresponsiveness. Per the patient's daughter, he was defibrillated twice via his device. EMS arrived and the patient was alert and oriented in VT, which resolved with vagal maneuvers. On admission, BNP was 425.4, HS-troponin was 17 >68 > 52, TSH 41.943, and T4 <0.25. Chest x-ray noted trace left pleural effusion. CT head with no acute abnormalities. Started on amiodarone  drip as well as mexiletine. TTE 06/12/24:  LVEF of 20-25%, normal RV function, mild to moderate TR. LHC 06/13/24: showed stable severe single vessel disease and chronically occluded SVG-OM3 similar to previous. TSH elevated, levothyroxine  restarted as he had been out of it for ~ 2 months.   He presents today for his initial HF visit with a chief complaint of dizziness. Sleeping ok. Denies any shortness of breath, fatigue, chest pain, palpitations, abdominal distention, pedal edema or cough.   Friend brought patient to his appointment today as he understands that he can't drive for 6 months after his recent VT event. Home BP running 105-107/ 60's  Previous cardiac history: Underwent CABG in 1994.  TTE in 05/2011 showed LVEF of 30-35%.  ICD implanted 02/2012.  LVEF decreased to  25-30% on TTE in 06/2011.  LHC in 10/2015 showed significant one-vessel CAD involving distal left circumflex into OM 3 and occluded SVG to OM 3. LVEF at that time 15%.  LHC 12/2020 with no significant changes from 2016.   Review of Systems: [y] = yes, [ ]  = no   General: Weight gain [ ] ; Weight loss [ ] ; Anorexia [ ] ; Fatigue [ ] ; Fever [ ] ; Chills [ ] ; Weakness [ ]   Cardiac: Chest pain/pressure [ ] ; Resting SOB [ ] ; Exertional SOB [ ] ; Orthopnea [ ] ; Pedal Edema [ ] ; Palpitations [ ] ; Syncope [ ] ; Presyncope [ ] ; Paroxysmal nocturnal dyspnea[ ]   Pulmonary: Cough [ ] ; Wheezing[ ] ; Hemoptysis[ ] ; Sputum [ ] ; Snoring [ ]   GI: Vomiting[ ] ; Dysphagia[ ] ; Melena[ ] ; Hematochezia [ ] ; Heartburn[ ] ; Abdominal pain [ ] ; Constipation [ ] ; Diarrhea [ ] ; BRBPR [ ]   GU: Hematuria[ ] ; Dysuria [ ] ; Nocturia[ ]   Vascular: Pain in legs with walking [ ] ; Pain in feet with lying flat [ ] ; Non-healing sores [ ] ; Stroke [ ] ; TIA [ ] ; Slurred speech [ ] ;  Neuro: Dizziness[y ]; Vertigo[ ] ; Seizures[ ] ; Paresthesias[ ] ;Blurred vision [ ] ; Diplopia [ ] ; Vision changes [ ]   Ortho/Skin: Arthritis [ ] ; Joint pain [ ] ; Muscle pain [ ] ; Joint swelling [ ] ; Back Pain [ ] ; Rash [ ]   Psych: Depression[ ] ; Anxiety[ ]   Heme: Bleeding problems [ ] ; Clotting disorders [ ] ; Anemia [ ]   Endocrine: Diabetes [ ] ; Thyroid  dysfunction[ ]    Past Medical History:  Diagnosis Date   Actinic keratosis    Arthritis  Atrial fibrillation, persistent-long-term    Chronic systolic congestive heart failure (HCC)    a. 03/2015 Echo: EF 30-35%, diff HK, mild MR, mod dil LA, mildly dil RA, mild TR.   Coronary artery disease    a. 1994 s/p CABG x 1 (VG->OM3);  b. 10/2015 Cath: LM nl, LAD min irregs, D1 nl, D2 min irregs, LCX 2m, OM2 nl, OM3 90 (small territory), VG->OM3 100, RCA min irregs, RPDA/RPL1/RPL2/RPL3 nl, EF 25%-->Med Rx.   Dysrhythmia    Hyperlipidemia    ICD (implantable cardiac defibrillator) in place    a. 02/2012 s/p SJM  [id number] Fort[employer] Assurance single lead AICD.   Mixed Ischemic and Non-ischemic Cardiomyopathy    a. 07/2012 s/p SJM AICD (RV lead only);  b. 03/2015 Echo: EF 30-35%, diff HK;  c. EF 25% by LV gram.   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 07/2012 s/ SJM AICD.    Current Outpatient Medications  Medication Sig Dispense Refill   amiodarone  (PACERONE ) 400 MG tablet Take 1 tablet (400 mg total) by mouth daily. 90 tablet 1   Cinnamon  500 MG TABS Take 1,000 mg by mouth 2 (two) times daily.      Coenzyme Q10 (CO Q 10 PO) Take 300 mg by mouth daily.     Continuous Blood Gluc Sensor (DEXCOM G7 SENSOR) MISC USE 1 SENSOR EVERY 10 DAYS     [Paused] eplerenone  (INSPRA ) 25 MG tablet TAKE ONE-HALF TABLET BY MOUTH  DAILY 45 tablet 3   folic acid  (FOLVITE ) 1 MG tablet Take 1 mg by mouth daily.      furosemide  (LASIX ) 20 MG tablet Take 20 mg by mouth daily. Added by PCP.     Garlic  1000 MG CAPS Take 1 capsule by mouth daily.      Glucosamine-Chondroit-Vit C-Mn (GLUCOSAMINE CHONDR 1500 COMPLX PO) Take 1,500 mg by mouth 2 (two) times daily.      levocetirizine (XYZAL ) 5 MG tablet Take 5 mg by mouth every evening.     levothyroxine  (SYNTHROID ) 75 MCG tablet Take 1 tablet (75 mcg total) by mouth daily at 6 (six) AM. 90 tablet 1   metoprolol  succinate (TOPROL -XL) 25 MG 24 hr tablet TAKE 1 TABLET BY MOUTH AT  BEDTIME 100 tablet 2   mexiletine (MEXITIL ) 150 MG capsule Take 1 capsule (150 mg total) by mouth every 12 (twelve) hours. 180 capsule 1   Omega-3 Fatty Acids (FISH OIL PO) Take 1 tablet by mouth 2 (two) times daily.     rosuvastatin  (CRESTOR ) 20 MG tablet Take 20 mg by mouth at bedtime.     [Paused] sacubitril -valsartan  (ENTRESTO ) 24-26 MG Take 1 tablet by mouth 2 (two) times  daily. 180 tablet 3   traZODone  (DESYREL ) 50 MG tablet Take 1 tablet by mouth at bedtime.     TRESIBA FLEXTOUCH 200 UNIT/ML FlexTouch Pen SMARTSIG:90 Unit(s) SUB-Q Every Night     XARELTO  15 MG TABS tablet TAKE 1 TABLET BY MOUTH ONCE  DAILY WITH A MEAL 100 tablet 2   No current facility-administered medications for this visit.    No Known Allergies    Social History   Socioeconomic History   Marital status: Married    Spouse name:  Not on file   Number of children: 3   Years of education: Not on file   Highest education level: Not on file  Occupational History   Not on file  Tobacco Use   Smoking status: Former    Current packs/day: 0.00    Average packs/day: 1 pack/day for 25.0 years (25.0 ttl pk-yrs)    Types: Cigarettes    Start date: 01/08/1963    Quit date: 01/09/1988    Years since quitting: 36.4   Smokeless tobacco: Never  Vaping Use   Vaping status: Never Used  Substance and Sexual Activity   Alcohol use: No   Drug use: No   Sexual activity: Not Currently  Other Topics Concern   Not on file  Social History Narrative   Not on file   Social Drivers of Health   Financial Resource Strain: Patient Declined (03/07/2024)   Received from [facility]   Overall Financial Resource Strain (CARDIA)    Difficulty of Paying Living Expenses: Patient declined  Food Insecurity: No Food Insecurity (06/13/2024)   Hunger Vital Sign    Worried About Running Out of Food in the Last Year: Never true    Ran Out of Food in the Last Year: Never true  Transportation Needs: No Transportation Needs (06/13/2024)   PRAPARE - Administrator, Civil Service (Medical): No    Lack of Transportation (Non-Medical): No  Physical Activity: Not on file  Stress: Not on file  Social Connections: Unknown (06/12/2024)   Social Connection and Isolation Panel    Frequency of Communication with Friends and Family: Once a week    Frequency of Social Gatherings with Friends and  Family: Three times a week    Attends Religious Services: 1 to 4 times per year    Active Member of Clubs or Organizations: Patient declined    Attends Banker Meetings: Patient declined    Marital Status: Married  Catering manager Violence: Not At Risk (06/12/2024)   Humiliation, Afraid, Rape, and Kick questionnaire    Fear of Current or Ex-Partner: No    Emotionally Abused: No    Physically Abused: No    Sexually Abused: No      Family History  Problem Relation Age of Onset   Heart attack Father    Heart attack Brother    Vitals:   06/19/24 1322  BP: 132/76  Pulse: 63  Temp: (!) 97.5 F (36.4 C)  TempSrc: Oral  SpO2: 97%  Weight: 217 lb (98.4 kg)  Height: 5' 10 (1.778 m)   Wt Readings from Last 3 Encounters:  06/19/24 217 lb (98.4 kg)  06/14/24 207 lb 0.2 oz (93.9 kg)  01/18/24 215 lb 2 oz (97.6 kg)   Lab Results  Component Value Date   CREATININE 1.91 (H) 06/14/2024   CREATININE 1.62 (H) 06/13/2024   CREATININE 1.67 (H) 06/12/2024    PHYSICAL EXAM:  General: Well appearing.  Cor: No JVD. Irregular rhythm, regular rate, 2/6 systolic murmur Lungs: clear Abdomen: soft, nontender, nondistended. Extremities: 1+ pitting edema bilateral lower legs Neuro:. Affect pleasant   ECG: not done   ASSESSMENT & PLAN:  1: ICM with reduced ejection fraction- - CABG 1994. Recent cath showed stable 1 vessel disease - NYHA class I - euvolemic - weighing daily - TTE 06/12/24:  LVEF of 20-25%, normal RV function, mild to moderate TR.  - continue furosemide  20mg  daily - continue metoprolol  succinate 25mg  daily - experienced dizziness with previous  SGLT2 use - eplerenone  currently on hold due to BP; consider trying losartan with transition back to entresto  if able - entresto  currently on hold due to BP - home SBP running 105-107 - start wearing compression socks - BNP 06/11/24 was 425.4  2: HTN- - BP 132/76. Home SBP running 105-107 - sees PCP  [facility]) - BMET 06/14/24 reviewed: sodium 138, potassium 4.2, creatinine 1.91 & GFR 34 - has upcoming labs next week  3: CAD-  - previous 1 vessel CABG 1994 - LHC 06/13/24: showed stable severe single vessel disease and chronically occluded SVG-OM3 similar to previous.   4: VT- - AICD. 2 recent shocks leading to recent admission - continue amiodarone  400mg  daily - continue mexiletine 150mg  Q 12 hours - saw EP ([facility]) 02/25 - TSH 06/12/24 was 35.827 after being out of thyroid  med for ~ 2 months. This was resumed during recent admission 07/25 - will need regular eye exams due to amiodarone   5: Hyperlipidemia- - LDL 06/14/24 was  - continue rosuvastatin  20mg  daily  6: Chronic atrial fibrillation- - rate controlled - EKG 06/12/24 was atrial fibrillation   Return in 2-3 weeks to see HF MD, sooner if needed.    Ellouise DELENA Class, FNP 06/19/24

## 2024-06-19 NOTE — Patient Instructions (Signed)
 It was a pleasure meeting you today!

## 2024-06-20 ENCOUNTER — Encounter: Payer: Self-pay | Admitting: Family

## 2024-06-24 ENCOUNTER — Other Ambulatory Visit: Payer: Self-pay

## 2024-06-24 ENCOUNTER — Emergency Department
Admission: EM | Admit: 2024-06-24 | Discharge: 2024-06-24 | Disposition: A | Discharge status: Home / Self Care | Attending: Emergency Medicine | Admitting: Emergency Medicine

## 2024-06-24 ENCOUNTER — Emergency Department

## 2024-06-24 DIAGNOSIS — I509 Heart failure, unspecified: Secondary | ICD-10-CM | POA: Insufficient documentation

## 2024-06-24 DIAGNOSIS — R0600 Dyspnea, unspecified: Secondary | ICD-10-CM

## 2024-06-24 DIAGNOSIS — R0602 Shortness of breath: Secondary | ICD-10-CM | POA: Diagnosis present

## 2024-06-24 DIAGNOSIS — R0609 Other forms of dyspnea: Secondary | ICD-10-CM | POA: Insufficient documentation

## 2024-06-24 DIAGNOSIS — Z87891 Personal history of nicotine dependence: Secondary | ICD-10-CM | POA: Insufficient documentation

## 2024-06-24 LAB — BASIC METABOLIC PANEL WITH GFR
Anion gap: 11 (ref 5–15)
BUN: 36 mg/dL — ABNORMAL HIGH (ref 8–23)
CO2: 26 mmol/L (ref 22–32)
Calcium: 9.5 mg/dL (ref 8.9–10.3)
Chloride: 99 mmol/L (ref 98–111)
Creatinine, Ser: 2.28 mg/dL — ABNORMAL HIGH (ref 0.61–1.24)
GFR, Estimated: 28 mL/min — ABNORMAL LOW (ref >60)
Glucose, Bld: 131 mg/dL — ABNORMAL HIGH (ref 70–99)
Potassium: 4.1 mmol/L (ref 3.5–5.1)
Sodium: 136 mmol/L (ref 135–145)

## 2024-06-24 LAB — TROPONIN I (HIGH SENSITIVITY): Troponin I (High Sensitivity): 19 ng/L — ABNORMAL HIGH (ref <18)

## 2024-06-24 LAB — CBC
HCT: 41 % (ref 39.0–52.0)
Hemoglobin: 13.5 g/dL (ref 13.0–17.0)
MCH: 33.5 pg (ref 26.0–34.0)
MCHC: 32.9 g/dL (ref 30.0–36.0)
MCV: 101.7 fL — ABNORMAL HIGH (ref 80.0–100.0)
Platelets: 173 K/uL (ref 150–400)
RBC: 4.03 MIL/uL — ABNORMAL LOW (ref 4.22–5.81)
RDW: 14.5 % (ref 11.5–15.5)
WBC: 9.1 K/uL (ref 4.0–10.5)
nRBC: 0 % (ref 0.0–0.2)

## 2024-06-24 LAB — BRAIN NATRIURETIC PEPTIDE: B Natriuretic Peptide: 298.5 pg/mL — ABNORMAL HIGH (ref 0.0–100.0)

## 2024-06-24 NOTE — ED Provider Notes (Signed)
 [facility] Provider Note   Event Date/Time   First MD Initiated Contact with Patient 06/24/24 2001     (approximate) History  Shortness of Breath  HPI Andres MEDLEN Sr. is a 83 y.o. male with past medical history of A-fib, OSA, and CHF who presents complaining of shortness of breath when he attempts to go to sleep.  Patient states that right before he is about to fall asleep, he feels extreme shortness of breath.  Patient also has intermittently woken up in the middle of the night gasping for breath.  Patient does not feel the shortness of breath when he is awake and laying flat.  Patient states that he has a CPAP machine however he occasionally falls asleep on the couch and does not use it.  These are the times when he is feeling more short of breath.  Patient denies any associated chest pain, diaphoresis, or increasing lower extremity edema. ROS: Patient currently denies any vision changes, tinnitus, difficulty speaking, facial droop, sore throat, chest pain, shortness of breath, abdominal pain, nausea/vomiting/diarrhea, dysuria, or weakness/numbness/paresthesias in any extremity   Physical Exam  Triage Vital Signs: ED Triage Vitals  Encounter Vitals Group     BP 06/24/24 1946 (!) 148/75     Girls Systolic BP Percentile --      Girls Diastolic BP Percentile --      Boys Systolic BP Percentile --      Boys Diastolic BP Percentile --      Pulse Rate 06/24/24 1946 65     Resp 06/24/24 1946 20     Temp 06/24/24 1946 98.6 F (37 C)     Temp Source 06/24/24 1946 Oral     SpO2 06/24/24 1946 100 %     Weight 06/24/24 1944 217 lb (98.4 kg)     Height 06/24/24 1944 5' 10 (1.778 m)     Head Circumference --      Peak Flow --      Pain Score 06/24/24 1945 0     Pain Loc --      Pain Education --      Exclude from Growth Chart --    Most recent vital signs: Vitals:   06/24/24 1946 06/24/24 1946  BP: (!) 148/75   Pulse: 65   Resp:  20  Temp:  98.6 F (37  C)  SpO2: 100%    General: Awake, oriented x4. CV:  Good peripheral perfusion. Resp:  Normal effort.  No hypoxia or increased respiratory effort after laying flat Abd:  No distention. Other:  Elderly obese Caucasian male resting comfortably in no acute distress.   ED Results / Procedures / Treatments  Labs (all labs ordered are listed, but only abnormal results are displayed) Labs Reviewed  BASIC METABOLIC PANEL WITH GFR - Abnormal; Notable for the following components:      Result Value   Glucose, Bld 131 (*)    BUN 36 (*)    Creatinine, Ser 2.28 (*)    GFR, Estimated 28 (*)    All other components within normal limits  CBC - Abnormal; Notable for the following components:   RBC 4.03 (*)    MCV 101.7 (*)    All other components within normal limits  TROPONIN I (HIGH SENSITIVITY) - Abnormal; Notable for the following components:   Troponin I (High Sensitivity) 19 (*)    All other components within normal limits  BRAIN NATRIURETIC PEPTIDE   EKG ED ECG REPORT I,  Welford Christmas K Mellina Benison, the attending physician, personally viewed and interpreted this ECG. Date: 06/24/2024 EKG Time: 1945 Rate: 65 Rhythm: Atrial fibrillation QRS Axis: normal Intervals: normal ST/T Wave abnormalities: normal Narrative Interpretation: Atrial fibrillation.  No evidence of acute ischemia RADIOLOGY ED MD interpretation: Single view portable chest x-ray shows cardiac enlargement with mild central vascular congestion without any consolidation or edema - All radiology independently interpreted and agree with radiology assessment Official radiology report(s): DG Chest Port 1 View Result Date: 06/24/2024 CLINICAL DATA:  Shortness of breath. History of atrial fibrillation, diabetes, heart disease, former smoker. EXAM: PORTABLE CHEST 1 VIEW COMPARISON:  06/11/2024 FINDINGS: Postoperative changes in the mediastinum. Cardiac pacemaker. Cardiac enlargement. Mild central pulmonary vascular congestion. No pleural  effusion or pneumothorax. No focal consolidation or airspace disease. Mediastinal contours appear intact. Calcification of the aorta. IMPRESSION: Cardiac enlargement with mild central vascular congestion. No consolidation or edema. Electronically Signed   By: Elsie Gravely M.D.   On: 06/24/2024 20:14   PROCEDURES: Critical Care performed: No Procedures MEDICATIONS ORDERED IN ED: Medications - No data to display IMPRESSION / MDM / ASSESSMENT AND PLAN / ED COURSE  I reviewed the triage vital signs and the nursing notes.                             The patient is on the cardiac monitor to evaluate for evidence of arrhythmia and/or significant heart rate changes. Patient's presentation is most consistent with acute presentation with potential threat to life or bodily function. The patient is suffering from shortness of breath, but the immediate cause is not apparent.  Potential causes considered include, but are not limited to, asthma or COPD, congestive heart failure, pulmonary embolism, pneumothorax, coronary syndrome, pneumonia, and pleural effusion.  Despite the evaluation including history, exam, and testing, the cause of the shortness of breath remains unclear.  It is possible that patient is experiencing hypnagogic apnea as well as full episodes of sleep apnea.  Patient was encouraged to use his CPAP at all times as well as follow-up with his primary care physician to possibly increasing his PEEP  Patient will be discharged with strict return precautions and advice to follow up with primary MD within 24 hours for further evaluation.   FINAL CLINICAL IMPRESSION(S) / ED DIAGNOSES   Final diagnoses:  PND (paroxysmal nocturnal dyspnea)   Rx / DC Orders   ED Discharge Orders     None      Note:  This document was prepared using Dragon voice recognition software and may include unintentional dictation errors.   Jossie Artist POUR, MD 06/24/24 [phone]

## 2024-06-24 NOTE — Discharge Instructions (Signed)
 Please adjust the PEEP on your CPAP machine. If it is on 5, please inrcease to 8 and if already on 8, please increase to 10

## 2024-06-24 NOTE — ED Triage Notes (Signed)
 Pt reports sob while laying flat, pt has defib and was admitted to the hospital aprox 2 weeks ago because it was not working correctly. Pt denies chest pain.

## 2024-07-04 ENCOUNTER — Other Ambulatory Visit: Payer: Self-pay | Admitting: Internal Medicine

## 2024-07-07 NOTE — Telephone Encounter (Signed)
 Prescription refill request for Xarelto  received.  Indication:afib Last office visit:7/25 Weight:98.4  kg Age:83 Scr:2.28  7/25 CrCl:34.17  ml/min  Prescription refilled

## 2024-07-09 ENCOUNTER — Encounter: Admitting: Cardiology

## 2024-07-22 NOTE — Progress Notes (Unsigned)
 Electrophysiology Clinic Note    Date:  07/23/2024  Patient ID:  Andres BUTCH Sr., DOB [birth year], MRN [id number] PCP:  [name], MD  Cardiologist:  Deatrice Cage, MD   Electrophysiologist:  [name], MD  Electrophysiology APP:  Miyeko Mahlum, NP     Discussed the use of AI scribe software for clinical note transcription with the patient, who gave verbal consent to proceed.   Patient Profile    Chief Complaint: VT, hospital follow-up  History of Present Illness: Andres BEAVERS Sr. is a 83 y.o. male with PMH notable for perm afib, ICM, VT s/p ICD, CAD s/p CABG, HFrEF, hypothyroid ; seen today for [name], MD for post hospital follow up.    He presented to [facility] after syncopal event 7/16 corresponding to VT on device with appropriate HV therapy x 2.  He was started on IV amiodarone  and mexiletine. Updated LHC with stable CAD. VT remained quiescent on amio. He was profoundly hypothyroid during hospitalization, was not taking synthroid .  He was seen by HF NP the week following discharge where entresto  and eplerenone  were held d/t low BP.  On follow-up today, he is overall doing very well. He has not had any further syncopal episodes, denies chest pain, chest pressure or palpitations. He does have intermittent LH with position changes that resolves quickly. He mostly notices it when at church after he has been sitting awhile.  Continues to take amiodarone , tolerating well. He checks BP at home and readings are 100-110 systolic.  He continues to play golf regularly.  He denies edema    Arrhythmia/Device History St. Jude single chamber ICD, imp 2013 Gen change 2019  + appropriate therapy     ROS:  Please see the history of present illness. All other systems are reviewed and otherwise negative.    Physical Exam    VS:  BP 96/60 (BP Location: Left Arm, Patient Position: Sitting, Cuff Size: Normal)   Pulse (!) 58   Ht 5' 10 (1.778  m)   Wt 218 lb (98.9 kg)   SpO2 97%   BMI 31.28 kg/m  BMI: Body mass index is 31.28 kg/m.  Orthostatic VS for the past 24 hrs (Last 3 readings):  BP- Lying Pulse- Lying BP- Sitting Pulse- Sitting BP- Standing at 0 minutes Pulse- Standing at 0 minutes BP- Standing at 3 minutes Pulse- Standing at 3 minutes  07/23/24 1012 126/71 60 126/66 64 132/71 74 143/67 84     Wt Readings from Last 3 Encounters:  07/23/24 218 lb (98.9 kg)  06/24/24 217 lb (98.4 kg)  06/19/24 217 lb (98.4 kg)     GEN- The patient is well appearing, alert and oriented x 3 today.   Lungs- Clear to ausculation bilaterally, normal work of breathing.  Heart- Regular rate and rhythm, no murmurs, rubs or gallops Extremities- No peripheral edema, warm, dry Skin-   device pocket well-healed, no tethering   Device interrogation done today and reviewed by myself:  Battery 3.9 years Lead threshold, impedence, sensing stable  VP 12% No episodes No changes made today   Studies Reviewed   Previous EP, cardiology notes.    EKG is ordered. Personal review of EKG from today shows:    EKG Interpretation Date/Time:  Wednesday July 23 2024 10:07:50 EDT Ventricular Rate:  58 PR Interval:    QRS Duration:  124 QT Interval:  472 QTC Calculation: 463 R Axis:   103  Text Interpretation: Atrial  fibrillation with slow ventricular response Rightward axis Non-specific intra-ventricular conduction delay Confirmed by Aidenn Skellenger [phone]) on 07/23/2024 10:17:22 AM    LHC, 06/13/2024 Conclusions: Stable severe single vessel coronary artery disease involving distal LCx/OM3, similar to last catheterization.  Non-obstructive disease noted in LAD and RCA. Chronically occluded SVG-OM3. Normal left ventricular filling pressure (LVEDP 10 mmHg).   Recommendations: Continue secondary prevention of coronary artery disease. Continue GDMT for chronic HFrEF, as tolerated. 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  TTE, 06/12/2024   1. Left ventricular ejection fraction, by estimation, is 20 to 25%. Left ventricular ejection fraction by 2D MOD biplane is 27.2 %. The left ventricle has severely decreased function. The left ventricle demonstrates global hypokinesis. The left ventricular internal cavity size was moderately dilated. Left ventricular diastolic parameters are indeterminate.   2. Right ventricular systolic function is normal. The right ventricular size is mildly enlarged. There is normal pulmonary artery systolic pressure. The estimated right ventricular systolic pressure is 20.4 mmHg.   3. Left atrial size was moderately dilated.   4. Right atrial size was mildly dilated.   5. The mitral valve is normal in structure. No evidence of mitral valve regurgitation. No evidence of mitral stenosis.   6. Tricuspid valve regurgitation is mild to moderate.   7. The aortic valve is normal in structure. Aortic valve regurgitation is not visualized. No aortic stenosis is present.   8. The inferior vena cava is normal in size with greater than 50% respiratory variability, suggesting right atrial pressure of 3 mmHg.   TTE, 02/22/2021  1. Left ventricular ejection fraction, by estimation, is 25 to 30%. The left ventricle has severely decreased function. The left ventricle demonstrates global hypokinesis. The left ventricular internal cavity size was mildly dilated. Left ventricular diastolic parameters are indeterminate.   2. Right ventricular systolic function is normal. The right ventricular size is normal. There is mildly elevated pulmonary artery systolic pressure.   3. Left atrial size was moderately dilated.   4. Right atrial size was moderately dilated.   5. The mitral valve is normal in structure. Mild mitral valve regurgitation. No evidence of mitral stenosis.   6. Tricuspid valve regurgitation is mild to moderate.   7. The aortic valve is normal in structure. Aortic valve regurgitation is not visualized. Mild to moderate  aortic valve sclerosis/calcification is present, without any evidence of aortic stenosis.   8. The inferior vena cava is normal in size with greater than 50% respiratory variability, suggesting right atrial pressure of 3 mmHg.    Assessment and Plan     #) VT s/p ICD #) amiodarone  monitoring VT quiescent on amiodarone  and mexiletine No episodes via ICD Reduce amiodarone  to 200mg  daily Continue 150mg  BID Update LFT, thyroid  labs today No driving for 6 months  #) HFrEF Appears warm and euvolemic on exam CorVue stable Plays golf multiple times a week  #) perm afib #) Hypercoag d/t afib CHA2DS2-VASc Score = at least 6 [CHF History: 1, HTN History: 1, Diabetes History: 1, Stroke History: 0, Vascular Disease History: 1, Age Score: 2, Gender Score: 0].  Therefore, the patient's annual risk of stroke is 9.7 %.    Stroke ppx - 15mg  xarelto  daily, appropriately dosed for CrCL < 67ml/min No bleeding concerns        Current medicines are reviewed at length with the patient today.   The patient does not have concerns regarding his medicines.  The following changes were made today:   REDUCE amidoarone to  200mg  daily  Labs/ tests ordered today include:  Orders Placed This Encounter  Procedures   Hepatic function panel   TSH   T4, free   EKG 12-Lead     Disposition: Follow up with Dr. Cindie or EP APP in 6 months, prefer MD to establish care after Dr. Celine retirement   Signed, Chantal Needle, NP  07/23/24  11:57 AM  Electrophysiology [facility]

## 2024-07-23 ENCOUNTER — Encounter: Payer: Self-pay | Admitting: Cardiology

## 2024-07-23 ENCOUNTER — Ambulatory Visit: Attending: Cardiology | Admitting: Cardiology

## 2024-07-23 VITALS — BP 96/60 | HR 58 | Ht 70.0 in | Wt 218.0 lb

## 2024-07-23 DIAGNOSIS — Z79899 Other long term (current) drug therapy: Secondary | ICD-10-CM

## 2024-07-23 DIAGNOSIS — I4821 Permanent atrial fibrillation: Secondary | ICD-10-CM | POA: Diagnosis not present

## 2024-07-23 DIAGNOSIS — I472 Ventricular tachycardia, unspecified: Secondary | ICD-10-CM

## 2024-07-23 DIAGNOSIS — Z5181 Encounter for therapeutic drug level monitoring: Secondary | ICD-10-CM

## 2024-07-23 DIAGNOSIS — Z95810 Presence of automatic (implantable) cardiac defibrillator: Secondary | ICD-10-CM | POA: Diagnosis not present

## 2024-07-23 DIAGNOSIS — I5022 Chronic systolic (congestive) heart failure: Secondary | ICD-10-CM

## 2024-07-23 LAB — CUP PACEART INCLINIC DEVICE CHECK
Battery Remaining Longevity: 49 mo
Brady Statistic RV Percent Paced: 12 %
Date Time Interrogation Session: 20250827120222
HighPow Impedance: 72 Ohm
Implantable Lead Connection Status: 753985
Implantable Lead Implant Date: 20130403
Implantable Lead Location: 753860
Implantable Pulse Generator Implant Date: 20190422
Lead Channel Impedance Value: 362.5 Ohm
Lead Channel Pacing Threshold Amplitude: 1 V
Lead Channel Pacing Threshold Amplitude: 1 V
Lead Channel Pacing Threshold Pulse Width: 0.5 ms
Lead Channel Pacing Threshold Pulse Width: 0.5 ms
Lead Channel Sensing Intrinsic Amplitude: 11.7 mV
Lead Channel Setting Pacing Amplitude: 2.5 V
Lead Channel Setting Pacing Pulse Width: 0.5 ms
Lead Channel Setting Sensing Sensitivity: 0.5 mV
Pulse Gen Serial Number: 9811176
Zone Setting Status: 755011

## 2024-07-23 NOTE — Patient Instructions (Signed)
 Medication Instructions:   Your physician recommends that you continue on your current medications as directed. Please refer to the Current Medication list given to you today.    *If you need a refill on your cardiac medications before your next appointment, please call your pharmacy*  Lab Work: Your provider would like for you to have following labs drawn today LFT, TSH and T4.   If you have labs (blood work) drawn today and your tests are completely normal, you will receive your results only by: MyChart Message (if you have MyChart) OR A paper copy in the mail If you have any lab test that is abnormal or we need to change your treatment, we will call you to review the results.  Testing/Procedures:  No test ordered today   Follow-Up: At [facility], you and your health needs are our priority.  As part of our continuing mission to provide you with exceptional heart care, our providers are all part of one team.  This team includes your primary Cardiologist (physician) and Advanced Practice Providers or APPs (Physician Assistants and Nurse Practitioners) who all work together to provide you with the care you need, when you need it.  Your next appointment:   6 month(s)  Provider:   Ole Holts, MD or Suzann Riddle, NP    We recommend signing up for the patient portal called MyChart.  Sign up information is provided on this After Visit Summary.  MyChart is used to connect with patients for Virtual Visits (Telemedicine).  Patients are able to view lab/test results, encounter notes, upcoming appointments, etc.  Non-urgent messages can be sent to your provider as well.   To learn more about what you can do with MyChart, go to [email or web address].   Other Instructions Please follow up with General Cardiologist, Dr. Darron, in 2-3 months

## 2024-07-24 ENCOUNTER — Ambulatory Visit: Payer: Self-pay | Admitting: Cardiology

## 2024-07-24 LAB — HEPATIC FUNCTION PANEL
ALT: 13 IU/L (ref 0–44)
AST: 24 IU/L (ref 0–40)
Albumin: 4.3 g/dL (ref 3.7–4.7)
Alkaline Phosphatase: 50 IU/L (ref 44–121)
Bilirubin Total: 0.7 mg/dL (ref 0.0–1.2)
Bilirubin, Direct: 0.3 mg/dL (ref 0.00–0.40)
Total Protein: 7.1 g/dL (ref 6.0–8.5)

## 2024-07-24 LAB — TSH: TSH: 4.55 u[IU]/mL — ABNORMAL HIGH (ref 0.450–4.500)

## 2024-07-24 LAB — T4, FREE: Free T4: 1.67 ng/dL (ref 0.82–1.77)

## 2024-08-19 ENCOUNTER — Ambulatory Visit (INDEPENDENT_AMBULATORY_CARE_PROVIDER_SITE_OTHER): Payer: Medicare Other

## 2024-08-19 DIAGNOSIS — I472 Ventricular tachycardia, unspecified: Secondary | ICD-10-CM

## 2024-08-19 LAB — CUP PACEART REMOTE DEVICE CHECK
Battery Remaining Longevity: 46 mo
Battery Remaining Percentage: 45 %
Battery Voltage: 2.93 V
Brady Statistic RV Percent Paced: 11 %
Date Time Interrogation Session: 20250923025547
HighPow Impedance: 77 Ohm
HighPow Impedance: 77 Ohm
Implantable Lead Connection Status: 753985
Implantable Lead Implant Date: 20130403
Implantable Lead Location: 753860
Implantable Pulse Generator Implant Date: 20190422
Lead Channel Impedance Value: 360 Ohm
Lead Channel Pacing Threshold Amplitude: 1 V
Lead Channel Pacing Threshold Pulse Width: 0.5 ms
Lead Channel Sensing Intrinsic Amplitude: 11.7 mV
Lead Channel Setting Pacing Amplitude: 2.5 V
Lead Channel Setting Pacing Pulse Width: 0.5 ms
Lead Channel Setting Sensing Sensitivity: 0.5 mV
Pulse Gen Serial Number: 9811176
Zone Setting Status: 755011

## 2024-08-20 ENCOUNTER — Ambulatory Visit: Payer: Self-pay | Admitting: Cardiology

## 2024-08-20 NOTE — Progress Notes (Signed)
Remote ICD Transmission.

## 2024-08-21 NOTE — Progress Notes (Signed)
Remote ICD Transmission.

## 2024-10-02 ENCOUNTER — Ambulatory Visit: Attending: Cardiovascular Disease | Admitting: Cardiovascular Disease

## 2024-10-02 ENCOUNTER — Encounter: Payer: Self-pay | Admitting: Cardiovascular Disease

## 2024-10-02 VITALS — BP 120/70 | HR 60 | Ht 70.0 in | Wt 211.1 lb

## 2024-10-02 DIAGNOSIS — I5022 Chronic systolic (congestive) heart failure: Secondary | ICD-10-CM | POA: Diagnosis not present

## 2024-10-02 DIAGNOSIS — E785 Hyperlipidemia, unspecified: Secondary | ICD-10-CM | POA: Diagnosis not present

## 2024-10-02 DIAGNOSIS — I482 Chronic atrial fibrillation, unspecified: Secondary | ICD-10-CM

## 2024-10-02 DIAGNOSIS — I25708 Atherosclerosis of coronary artery bypass graft(s), unspecified, with other forms of angina pectoris: Secondary | ICD-10-CM

## 2024-10-02 DIAGNOSIS — I472 Ventricular tachycardia, unspecified: Secondary | ICD-10-CM

## 2024-10-02 NOTE — Progress Notes (Signed)
 Cardiology Office Note   Date:  10/02/2024   ID:  Andres LITTIE Gobble Sr., DOB [birth year], MRN [id number]  PCP:  [name], MD  Cardiologist:   Deatrice Cage, MD   Chief Complaint  Patient presents with   Follow-up    6 month f/u c/o vertigo. Meds reviewed verbally with pt.       History of Present Illness: Andres BROOKENS Sr. is a 83 y.o. male who presents for a follow up regarding chronic systolic heart failure, chronic atrial fibrillation and coronary artery disease.   He had previous one-vessel CABG in October 06, 1993 after failed angioplasty on the left circumflex complicated by dissection.   Cardiac catheterization in December 2016 showed occluded SVG to OM 3. The mid left circumflex had diffuse 90% disease into a small OM3 which was also diffusely diseased and relatively small in size. EF was 15% with mildly elevated LVEDP. I recommended medical therapy. He had previous ventricular tachycardia and syncope that was successfully treated with ICD shock with subsequent adjustment of his device to deliver ATP.   He had ICD generator replacement in April 2019.  He was hospitalized in June 2021 with syncope and was found to have ventricular tachycardia treated with ATP.  CT scan showed subdural hematoma which necessitated holding Xarelto  at that time.  He had recurrent ventricular tachycardia in early 10/06/21 thus underwent cardiac catheterization February 2022 which showed significant underlying one-vessel coronary artery disease affecting the mid to distal left circumflex into OM 3 with chronically occluded SVG to OM 3 and stable moderate mid LAD disease.  Overall, no significant change from most recent cardiac catheterization in Oct 07, 2015.  LVEDP was normal.  He was treated with amiodarone .  Jardiance was associated with dizziness.  Unfortunately, his wife died in 10/06/2022 due to breast cancer.  His daughter lives with him.  He was hospitalized in July with syncope that correlated with VT.  He  was started on IV amiodarone  and mexiletine.  Cardiac catheterization showed no significant change in his anatomy with no obstructive disease.  He was very hypothyroid and was not taking Synthroid  which was resumed.  He had issues with hypotension that required holding Entresto  and eplerenone .  He is back on these medications and has been doing reasonably well from a cardiac standpoint with no chest pain or shortness of breath.  He is dealing with vertigo now.   Past Medical History:  Diagnosis Date   Actinic keratosis    Arthritis    Atrial fibrillation, persistent-long-term    Chronic systolic congestive heart failure (HCC)    a. 03/2015 Echo: EF 30-35%, diff HK, mild MR, mod dil LA, mildly dil RA, mild TR.   Coronary artery disease    a. 1994 s/p CABG x 1 (VG->OM3);  b. 10/2015 Cath: LM nl, LAD min irregs, D1 nl, D2 min irregs, LCX 11m, OM2 nl, OM3 90 (small territory), VG->OM3 100, RCA min irregs, RPDA/RPL1/RPL2/RPL3 nl, EF 25%-->Med Rx.   Dysrhythmia    Hyperlipidemia    ICD (implantable cardiac defibrillator) in place    a. 02/2012 s/p SJM [id number] Fort[employer] Assurance single lead AICD.   Mixed Ischemic and Non-ischemic Cardiomyopathy    a. 07/2012 s/p SJM AICD (RV lead only);  b. 03/2015 Echo: EF 30-35%, diff HK;  c. EF 25% by LV gram.   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 07/2012 s/ SJM AICD.    Past Surgical History:  Procedure Laterality Date   basel cell removal     left arm   CARDIAC CATHETERIZATION  02/28/2012   Minor disease in LAD and RCA, LCX: 70% mid and occluded distally. Patent SVG to OM3   CARDIAC CATHETERIZATION N/A 11/08/2015   Procedure: Left Heart Cath and Cors/Grafts Angiography;  Surgeon: [name], MD;  Location: [facility] INVASIVE CV  LAB;  Service: Cardiovascular;  Laterality: N/A;   CARDIOVERSION  03/01/2012   Procedure: CARDIOVERSION;  Surgeon: [name], MD;  Location: [facility] OR;  Service: Cardiovascular;  Laterality: N/A;   CATARACT EXTRACTION W/ INTRAOCULAR LENS  IMPLANT, BILATERAL  2011   CORONARY ARTERY BYPASS GRAFT  1994   [facility] ; CABG X1   ELECTROPHYSIOLOGY STUDY N/A 02/28/2012   Procedure: ELECTROPHYSIOLOGY STUDY;  Surgeon: [name], MD;  Location: [facility] CATH LAB;  Service: Cardiovascular;  Laterality: N/A;   ICD GENERATOR CHANGEOUT N/A 03/18/2018   Procedure: ICD GENERATOR CHANGEOUT;  Surgeon: Waddell Danelle ORN, MD;  Location: [facility] INVASIVE CV LAB;  Service: Cardiovascular;  Laterality: N/A;   INSERT / REPLACE / REMOVE PACEMAKER  02/28/12   w/defibrillator   JOINT REPLACEMENT     KNEE ARTHROSCOPY  2012   right   LEFT HEART CATH AND CORONARY ANGIOGRAPHY N/A 01/10/2021   Procedure: LEFT HEART CATH AND CORONARY ANGIOGRAPHY;  Surgeon: Cage Deatrice DELENA, MD;  Location: [facility] INVASIVE CV LAB;  Service: Cardiovascular;  Laterality: N/A;   LEFT HEART CATH AND CORONARY ANGIOGRAPHY N/A 06/13/2024   Procedure: LEFT HEART CATH AND CORONARY ANGIOGRAPHY;  Surgeon: Mady Bruckner, MD;  Location: [facility] INVASIVE CV LAB;  Service: Cardiovascular;  Laterality: N/A;   LEFT HEART CATHETERIZATION WITH CORONARY/GRAFT ANGIOGRAM N/A 02/28/2012   Procedure: LEFT HEART CATHETERIZATION WITH EL BILE;  Surgeon: [name], MD;  Location: [facility] CATH LAB;  Service: Cardiovascular;  Laterality: N/A;   TOTAL KNEE ARTHROPLASTY  2011   left     Current Outpatient Medications  Medication Sig Dispense Refill   amiodarone  (PACERONE ) 400 MG tablet Take 1 tablet (400 mg total) by mouth daily. 90 tablet 1   Cinnamon  500 MG TABS Take 1,000 mg by mouth 2 (two) times daily.      Coenzyme Q10 (CO Q 10 PO) Take 300 mg by mouth daily.     Continuous Blood Gluc Sensor (DEXCOM G7 SENSOR) MISC USE 1 SENSOR EVERY 10 DAYS     eplerenone  (INSPRA )  25 MG tablet TAKE ONE-HALF TABLET BY MOUTH  DAILY 45 tablet 3   folic acid  (FOLVITE ) 1 MG tablet Take 1 mg by mouth daily.      furosemide  (LASIX ) 20 MG tablet Take 20 mg by mouth daily. Added by PCP.     Garlic  1000 MG CAPS Take 1 capsule by mouth daily.      Glucosamine-Chondroit-Vit C-Mn (GLUCOSAMINE CHONDR 1500 COMPLX PO) Take 1,500 mg by mouth 2 (two) times daily.      insulin  lispro (HUMALOG) 100 UNIT/ML KwikPen INJECT 40 UNITS SUBCUTANEOUSLY BEFORE BREAKFAST, 30 UNITS BEFORE LUNCH, & 20 UNITS BEFORE SUPPER     levocetirizine (XYZAL ) 5 MG tablet Take 5 mg by mouth every evening.     levothyroxine  (SYNTHROID ) 75 MCG tablet Take 1 tablet (75 mcg total) by mouth daily at 6 (six) AM.  90 tablet 1   metoprolol  succinate (TOPROL -XL) 25 MG 24 hr tablet TAKE 1 TABLET BY MOUTH AT  BEDTIME 100 tablet 2   mexiletine (MEXITIL ) 150 MG capsule Take 1 capsule (150 mg total) by mouth every 12 (twelve) hours. 180 capsule 1   Omega-3 Fatty Acids (FISH OIL PO) Take 1 tablet by mouth 2 (two) times daily.     rosuvastatin  (CRESTOR ) 20 MG tablet Take 20 mg by mouth at bedtime.     sacubitril -valsartan  (ENTRESTO ) 24-26 MG Take 1 tablet by mouth 2 (two) times daily. 180 tablet 3   traZODone  (DESYREL ) 50 MG tablet Take 1 tablet by mouth at bedtime.     TRESIBA FLEXTOUCH 200 UNIT/ML FlexTouch Pen SMARTSIG:90 Unit(s) SUB-Q Every Night (Patient taking differently: 90 Units in the morning.)     XARELTO  15 MG TABS tablet TAKE 1 TABLET BY MOUTH ONCE  DAILY WITH A MEAL 100 tablet 2   No current facility-administered medications for this visit.    Allergies:   Patient has no known allergies.    Social History:  The patient  reports that he quit smoking about 36 years ago. His smoking use included cigarettes. He started smoking about 61 years ago. He has a 25 pack-year smoking history. He has never used smokeless tobacco. He reports that he does not drink alcohol and does not use drugs.   Family History:  The patient's  family history includes Heart attack in his brother and father.    ROS:  Please see the history of present illness.   Otherwise, review of systems are positive for none.   All other systems are reviewed and negative.    PHYSICAL EXAM: VS:  BP 120/70 (BP Location: Left Arm, Patient Position: Sitting, Cuff Size: Normal)   Pulse 60   Ht 5' 10 (1.778 m)   Wt 211 lb 2 oz (95.8 kg)   SpO2 98%   BMI 30.29 kg/m  , BMI Body mass index is 30.29 kg/m. GEN: Well nourished, well developed, in no acute distress  HEENT: normal  Neck: no JVD, carotid bruits, or masses Cardiac: Irregularly irregular; no murmurs, rubs, or gallops,no edema  Respiratory:  clear to auscultation bilaterally, normal work of breathing GI: soft, nontender, nondistended, + BS MS: no deformity or atrophy  Skin: warm and dry, no rash Neuro:  Strength and sensation are intact Psych: euthymic mood, full affect   EKG:  EKG is ordered today. The ekg ordered today demonstrates: Atrial fibrillation Rightward axis Non-specific intra-ventricular conduction delay Nonspecific ST abnormality   Recent Labs: 06/11/2024: Magnesium  2.1 06/24/2024: B Natriuretic Peptide 298.5; BUN 36; Creatinine, Ser 2.28; Hemoglobin 13.5; Platelets 173; Potassium 4.1; Sodium 136 07/23/2024: ALT 13; TSH 4.550    Lipid Panel    Component Value Date/Time   CHOL 143 06/14/2024 0446   TRIG 129 06/14/2024 0446   HDL 31 (L) 06/14/2024 0446   CHOLHDL 4.6 06/14/2024 0446   VLDL 26 06/14/2024 0446   LDLCALC 86 06/14/2024 0446      Wt Readings from Last 3 Encounters:  10/02/24 211 lb 2 oz (95.8 kg)  07/23/24 218 lb (98.9 kg)  06/24/24 217 lb (98.4 kg)      ASSESSMENT AND PLAN:  1.  Chronic systolic heart failure: Most recent ejection fraction was 25-30 %.  He appears to be euvolemic on Lasix  20 mg once daily.  Continue Toprol , Entresto  and eplerenone . He did not tolerate Jardiance in the past due to dizziness.  2. Coronary artery disease:  Mainly affecting the left circumflex supplying a small territory.  Continue medical therapy.  Recent cardiac catheterization in July showed stable coronary anatomy.  3. Chronic atrial fibrillation: Continue rate control with small dose Toprol .  Continue anticoagulation with Xarelto  15 mg once daily.  We should consider switching him to low-dose Eliquis given gradual decline of renal function.  4. Diabetes mellitus:  Managed by endocrinology.  5. Hyperlipidemia: He is not able to tolerate higher doses of statins but is tolerating rosuvastatin  10 mg daily.  Most recent lipid profile showed an LDL of 86.  6.  Recurrent ventricular tachycardia: The patient has an ICD in place.  He is now on amiodarone  and mexiletine.   Disposition:   FU with me in 6 months  Signed,  Deatrice Cage, MD  10/02/2024 9:58 AM    [facility]

## 2024-10-02 NOTE — Patient Instructions (Signed)

## 2024-10-05 ENCOUNTER — Ambulatory Visit: Payer: Self-pay | Admitting: Cardiology

## 2024-10-27 ENCOUNTER — Other Ambulatory Visit (HOSPITAL_COMMUNITY): Payer: Self-pay

## 2024-10-30 ENCOUNTER — Other Ambulatory Visit (HOSPITAL_COMMUNITY): Payer: Self-pay

## 2024-10-31 ENCOUNTER — Other Ambulatory Visit (HOSPITAL_COMMUNITY): Payer: Self-pay

## 2024-10-31 MED ORDER — INSULIN LISPRO (0.5 UNIT DIAL) 100 UNIT/ML (KWIKPEN JR): PEN_INJECTOR | SUBCUTANEOUS | 3 refills | Status: AC

## 2024-10-31 MED ORDER — LEVOTHYROXINE SODIUM 88 MCG PO TABS: 88.0000 ug | ORAL_TABLET | Freq: Every morning | ORAL | 1 refills | Status: AC

## 2024-10-31 MED ORDER — INSULIN DEGLUDEC 200 UNIT/ML ~~LOC~~ SOPN: 90.0000 [IU] | PEN_INJECTOR | Freq: Every day | SUBCUTANEOUS | 4 refills | Status: AC

## 2024-10-31 MED ORDER — ROSUVASTATIN CALCIUM 20 MG PO TABS: 20.0000 mg | ORAL_TABLET | Freq: Every day | ORAL | 3 refills | Status: AC

## 2024-10-31 MED ORDER — AZELASTINE HCL 0.1 % NA SOLN
1.0000 | Freq: Two times a day (BID) | NASAL | 11 refills | Status: AC
  Filled 2024-10-31: qty 30, 100d supply, fill #0

## 2024-10-31 MED FILL — Rivaroxaban Tab 15 MG: ORAL | 100 days supply | Qty: 100 | Fill #0 | Status: AC

## 2024-11-01 ENCOUNTER — Other Ambulatory Visit (HOSPITAL_COMMUNITY): Payer: Self-pay

## 2024-11-04 ENCOUNTER — Other Ambulatory Visit: Payer: Self-pay

## 2024-11-12 ENCOUNTER — Emergency Department
Admission: EM | Admit: 2024-11-12 | Discharge: 2024-11-12 | Disposition: A | Discharge status: Home / Self Care | Payer: Self-pay | Attending: Emergency Medicine | Admitting: Emergency Medicine

## 2024-11-12 ENCOUNTER — Other Ambulatory Visit: Payer: Self-pay

## 2024-11-12 ENCOUNTER — Emergency Department: Payer: Self-pay

## 2024-11-12 DIAGNOSIS — I2510 Atherosclerotic heart disease of native coronary artery without angina pectoris: Secondary | ICD-10-CM | POA: Insufficient documentation

## 2024-11-12 DIAGNOSIS — I4891 Unspecified atrial fibrillation: Secondary | ICD-10-CM | POA: Insufficient documentation

## 2024-11-12 DIAGNOSIS — I5023 Acute on chronic systolic (congestive) heart failure: Secondary | ICD-10-CM | POA: Insufficient documentation

## 2024-11-12 DIAGNOSIS — Z7901 Long term (current) use of anticoagulants: Secondary | ICD-10-CM | POA: Insufficient documentation

## 2024-11-12 LAB — T4, FREE: Free T4: 1.27 ng/dL (ref 0.80–2.00)

## 2024-11-12 LAB — CBC
HCT: 42 % (ref 39.0–52.0)
Hemoglobin: 13.7 g/dL (ref 13.0–17.0)
MCH: 31.6 pg (ref 26.0–34.0)
MCHC: 32.6 g/dL (ref 30.0–36.0)
MCV: 96.8 fL (ref 80.0–100.0)
Platelets: 199 K/uL (ref 150–400)
RBC: 4.34 MIL/uL (ref 4.22–5.81)
RDW: 16.7 % — ABNORMAL HIGH (ref 11.5–15.5)
WBC: 8.1 K/uL (ref 4.0–10.5)
nRBC: 0 % (ref 0.0–0.2)

## 2024-11-12 LAB — TSH: TSH: 10.5 u[IU]/mL — ABNORMAL HIGH (ref 0.350–4.500)

## 2024-11-12 LAB — BASIC METABOLIC PANEL WITH GFR
Anion gap: 11 (ref 5–15)
BUN: 32 mg/dL — ABNORMAL HIGH (ref 8–23)
CO2: 26 mmol/L (ref 22–32)
Calcium: 9.3 mg/dL (ref 8.9–10.3)
Chloride: 104 mmol/L (ref 98–111)
Creatinine, Ser: 2.03 mg/dL — ABNORMAL HIGH (ref 0.61–1.24)
GFR, Estimated: 32 mL/min — ABNORMAL LOW (ref >60)
Glucose, Bld: 178 mg/dL — ABNORMAL HIGH (ref 70–99)
Potassium: 4.5 mmol/L (ref 3.5–5.1)
Sodium: 140 mmol/L (ref 135–145)

## 2024-11-12 LAB — PRO BRAIN NATRIURETIC PEPTIDE: Pro Brain Natriuretic Peptide: 4972 pg/mL — ABNORMAL HIGH (ref <300.0)

## 2024-11-12 LAB — TROPONIN T, HIGH SENSITIVITY
Troponin T High Sensitivity: 45 ng/L — ABNORMAL HIGH (ref 0–19)
Troponin T High Sensitivity: 53 ng/L — ABNORMAL HIGH (ref 0–19)

## 2024-11-12 MED ORDER — FUROSEMIDE 10 MG/ML IJ SOLN
40.0000 mg | Freq: Once | INTRAMUSCULAR | Status: AC
  Administered 2024-11-12: 01:00:00 40 mg via INTRAVENOUS
  Filled 2024-11-12: qty 4

## 2024-11-12 MED ORDER — ASPIRIN 81 MG PO CHEW
324.0000 mg | CHEWABLE_TABLET | Freq: Once | ORAL | Status: AC
  Administered 2024-11-12: 01:00:00 324 mg via ORAL
  Filled 2024-11-12: qty 4

## 2024-11-12 NOTE — ED Provider Notes (Signed)
 [facility] Provider Note    Event Date/Time   First MD Initiated Contact with Patient 11/12/24 0020     (approximate)   History   Shortness of Breath   HPI  Andres CORVIN Sr. is a 83 y.o. male   Past medical history of CAD, CHF, ICD, A-fib on Xarelto , here with shortness of breath, worse with exertion, orthopnea.  Has been taking his diuretic as prescribed.  Has been fully compliant with his anticoagulation as well.  Has a vague discomfort in the central chest as well.  No respiratory infectious symptoms.  No GI or GU complaints.  No syncope or shocks from his ICD.  Independent Historian contributed to assessment above:  Daughter at bedside corroborates information above  External Medical Documents Reviewed: Recent cardiology outpatient notes      Physical Exam   Triage Vital Signs: ED Triage Vitals  Encounter Vitals Group     BP 11/12/24 0017 (!) 132/90     Girls Systolic BP Percentile --      Girls Diastolic BP Percentile --      Boys Systolic BP Percentile --      Boys Diastolic BP Percentile --      Pulse Rate 11/12/24 0017 (!) 56     Resp 11/12/24 0017 20     Temp 11/12/24 0017 97.7 F (36.5 C)     Temp src --      SpO2 11/12/24 0017 98 %     Weight 11/12/24 0015 212 lb (96.2 kg)     Height 11/12/24 0015 5' 10 (1.778 m)     Head Circumference --      Peak Flow --      Pain Score 11/12/24 0015 0     Pain Loc --      Pain Education --      Exclude from Growth Chart --     Most recent vital signs: Vitals:   11/12/24 0230 11/12/24 0330  BP: 116/80 121/81  Pulse: (!) 56 (!) 44  Resp:  19  Temp:  97.7 F (36.5 C)  SpO2:  97%    General: Awake, no distress.  CV:  Good peripheral perfusion.  Resp:  Normal effort.  Abd:  No distention.  Other:  No respiratory distress, no hypoxemia on room air.  Fine crackles to lung bases bilaterally without focality.  Very mild edema to ankles bilaterally.   ED Results / Procedures  / Treatments   Labs (all labs ordered are listed, but only abnormal results are displayed) Labs Reviewed  BASIC METABOLIC PANEL WITH GFR - Abnormal; Notable for the following components:      Result Value   Glucose, Bld 178 (*)    BUN 32 (*)    Creatinine, Ser 2.03 (*)    GFR, Estimated 32 (*)    All other components within normal limits  CBC - Abnormal; Notable for the following components:   RDW 16.7 (*)    All other components within normal limits  PRO BRAIN NATRIURETIC PEPTIDE - Abnormal; Notable for the following components:   Pro Brain Natriuretic Peptide 4,972.0 (*)    All other components within normal limits  TSH - Abnormal; Notable for the following components:   TSH 10.500 (*)    All other components within normal limits  TROPONIN T, HIGH SENSITIVITY - Abnormal; Notable for the following components:   Troponin T High Sensitivity 53 (*)    All other components within normal limits  TROPONIN T, HIGH SENSITIVITY - Abnormal; Notable for the following components:   Troponin T High Sensitivity 45 (*)    All other components within normal limits  T4, FREE     I ordered and reviewed the above labs they are notable for initial troponin 53, elevated proBNP at 4900.  EKG  ED ECG REPORT I, Ginnie Shams, the attending physician, personally viewed and interpreted this ECG.   Date: 11/12/2024  EKG Time: 0030  Rate: 54  Rhythm: AF  Axis: nl  Intervals:nl  ST&T Change: no stemi    RADIOLOGY I independently reviewed and interpreted chest x-ray and I see diffuse opacities consistent with pulmonary edema I also reviewed radiologist's formal read.   PROCEDURES:  Critical Care performed: No  Procedures   MEDICATIONS ORDERED IN ED: Medications  aspirin  chewable tablet 324 mg (324 mg Oral Given 11/12/24 0125)  furosemide  (LASIX ) injection 40 mg (40 mg Intravenous Given 11/12/24 0118)     IMPRESSION / MDM / ASSESSMENT AND PLAN / ED COURSE  I reviewed the triage  vital signs and the nursing notes.                                Patient's presentation is most consistent with acute presentation with potential threat to life or bodily function.  Differential diagnosis includes, but is not limited to, heart failure exacerbation, respiratory infection, ACS, considered but less likely PE, dissection   The patient is on the cardiac monitor to evaluate for evidence of arrhythmia and/or significant heart rate changes.  MDM:    Orthopnea and exertional dyspnea likely represent CHF exacerbation.  He looks slightly fluid up, with crackles on lung exam, slight lower extremity edema, and radiographic evidence of pulmonary edema as well as elevated proBNP suggest this is a CHF exacerbation.  Given IV dose of Lasix .  No respiratory distress no hypoxemia, defer BiPAP.  Will reassess after initial diuresis.  Not consistent with PE and since he has been fully compliant with anticoagulation this is highly unlikely.  Not consistent with PE as well.  No respiratory infectious symptoms suggest infection.  Cardiac risk factors, with vague chest discomfort, check for ACS.  Give aspirin .  Fortunately nonischemic EKG.  Initial troponin 50 with no prior for comparison.  Will repeat to check for stability or increasing, but will determine disposition as well as his reassessment after diuresis.  Troponin is stable.  Chest pain-free.  Feels much better after significant diuresis with 40 mg of IV Lasix .  I considered hospitalization for admission or observation however given unremarkable evaluation as above, stable troponins, feels much better after diuresis, I think outpatient follow-up is appropriate this time.  He is comfortable with this plan.  Will call cardiologist morning.  Return precautions given.        FINAL CLINICAL IMPRESSION(S) / ED DIAGNOSES   Final diagnoses:  Acute on chronic systolic congestive heart failure (HCC)     Rx / DC Orders   ED Discharge  Orders     None        Note:  This document was prepared using Dragon voice recognition software and may include unintentional dictation errors.    Shams Ginnie, MD 11/12/24 [phone]

## 2024-11-12 NOTE — ED Triage Notes (Signed)
 Pt reports sob that started tonight, pt repots worse when laying down. Pt has hx CHF, denies cough congestion or fever. Denies chest pain, reports hx afib. Has pacemaker

## 2024-11-12 NOTE — Discharge Instructions (Addendum)
 Please continue taking your water pills as directed by your cardiologist but please give them a call in the morning regarding the extra fluid in your lungs and adjustment of these medications moving forward as needed.  Thank you for choosing us  for your health care today!  Please see your primary doctor this week for a follow up appointment.   If you have any new, worsening, or unexpected symptoms call your doctor right away or come back to the emergency department for reevaluation.  It was my pleasure to care for you today.   Ginnie EDISON Cyrena, MD

## 2024-11-16 ENCOUNTER — Other Ambulatory Visit: Payer: Self-pay | Admitting: Cardiovascular Disease

## 2024-11-18 ENCOUNTER — Ambulatory Visit: Payer: Medicare Other

## 2024-11-18 DIAGNOSIS — I255 Ischemic cardiomyopathy: Secondary | ICD-10-CM | POA: Diagnosis not present

## 2024-11-18 NOTE — Discharge Summary (Signed)
 "                      [facility]                          Heart Failure Discharge Summary   Admit Date: 11/14/2024  Discharge Date: 11/18/2024  Admitting Physician: Andres Fruit, MD  Discharge Physician: No att. providers found  Primary Care Provider: Diedra Andres Ruts, MD  Primary Cardiologist: [facility]    Discharge Destination: Home  Discharge Services: none  Code Status: Full Code   Admission Diagnoses:  Acute dyspnea [R06.00]  Discharge Diagnoses:  Principal Problem:   Volume overload Active Problems:   Essential hypertension, benign   Hyperlipemia   Chronic systolic heart failure (CMS/HHS-HCC)   Paroxysmal ventricular tachycardia (CMS/HHS-HCC)   Ischemic cardiomyopathy   Atrial fibrillation (CMS/HHS-HCC)   Chronic kidney disease   SOB (shortness of breath) Resolved Problems:   * No resolved hospital problems. *     Anticipatory Guidance: Presented with volume overload, now diuresed to euvolemia.  - Increased lasix  to 40 mg daily  - Start Farxiga 10 mg  - BMP to be completed at follow up 12/26  - EDW: 90.3 kg    Cardiac Rehab: ordered  Patient Discharge Instructions:   Ambulatory Referral to Cardiac Rehab  Referral Priority: Routine Referral Type: Consultation  Number of Visits Requested: 1   If you smoke (or have smoked within the last year), we strongly recommend that you do not smoke.   Weigh yourself daily and record   Low cholesterol, low fat   Notify cardiology provider of chest pain   Notify provider of swelling in arms, legs, or stomach   Notify provider temperature greater than 101.0 F (38.3 C) degrees   Notify provider of weight gain greater than 2 lbs in 1 day or 5 lbs in 1 week   Report questions or concerns to the [facility] at [phone]   Notify primary care physician of other symptoms   For a life-threatening emergency, call 911   Follow-up with Primary Care Provider   Follow-up with  Cardiology     [facility] Provider Follow-up: Future Appointments  Date Time Provider Department Center  02/05/2025  9:30 AM [facility]  02/10/2025  9:30 AM Andres [name], Andres Setter, MD [facility]  04/10/2025 10:00 AM Andres [name], Andres Ruts, MD [facility]    Non-[facility] Provider Follow-up:    Report Issues: By using [facility], or by calling the [facility] at [phone].  For urgent issues or after business hours (after 5pm on weekdays and anytime on weekends), call the [facility] Operator [phone] and ask to page the on-call cardiologist.     Allergies/Intolerances:  No Known Allergies   Medications:     Discharge Medications     New Medications      Details  FARXIGA 10 mg tablet Generic drug: dapagliflozin propanediol  10 mg, Oral, Daily Quantity: 90 tablet Refills: 0       Modified Medications      Details  FUROsemide  40 MG tablet Commonly known as: LASIX  What changed:  medication strength how much to take  40 mg, Oral, Daily Quantity: 30 tablet Refills: 0   TRESIBA  FLEXTOUCH U-200 pen injector (concentration 200 units/mL) Generic drug: insulin  DEGLUDEC What changed: See the new instructions.  INJECT 90 UNITS SUBCUTANEOUSLY AT BEDTIME Quantity:  24 mL Refills: 4       Medications To Continue      Details  AMIOdarone  200 MG tablet Commonly known as: PACERONE   200 mg, 2 times Daily Refills: 0   apple cider vinegar 300 mg Tab  Daily Refills: 0   azelastine  137 mcg nasal spray Commonly known as: ASTELIN   1 spray, Both Nares, 2 times Daily Quantity: 30 mL Refills: 11   cinnamon  bark 500 mg capsule  1,000 mg, Daily Refills: 0   COQ10 (LIPOSOMAL UBIQUINOL) ORAL  1 tablet, Daily Refills: 0   eplerenone  25 MG tablet Commonly known as: INSPRA  Start taking on: November 19, 2024  25 mg, Oral, Daily Quantity: 30 tablet Refills: 0   FISH OIL ORAL  Daily Refills: 0    fluticasone propionate 50 mcg/actuation nasal spray Commonly known as: FLONASE  2 sprays, Daily Refills: 0   garlic  1,000 mg Cap  Daily Refills: 0   glucosamine HCl 1,500 mg Tab  1,500 mg, 2 times Daily Refills: 0   insulin  LISPRO pen injector (concentration 100 units/mL) Commonly known as: HumaLOG  KWIKPEN  Inject 40 units before breakfast, 30 units before lunch, 20 units before supper. Quantity: 90 mL Refills: 3   levocetirizine 5 MG tablet Commonly known as: XYZAL   5 mg, Every evening Refills: 0   levothyroxine  88 MCG tablet Commonly known as: SYNTHROID   88 mcg, Oral, Daily before breakfast (0630) Quantity: 100 tablet Refills: 1   metoprolol  SUCCinate 25 MG XL tablet Commonly known as: TOPROL -XL  25 mg, Oral, Nightly Quantity: 30 tablet Refills: 0   mexiletine 150 MG capsule Commonly known as: MEXITIL   150 mg, 2 times Daily Refills: 0   rosuvastatin  20 MG tablet Commonly known as: CRESTOR   20 mg, Oral, Daily Quantity: 90 tablet Refills: 3   sacubitriL -valsartan  24-26 mg tablet Commonly known as: ENTRESTO   1 tablet, Oral, Every 12 hours Quantity: 180 tablet Refills: 0   XARELTO  15 mg tablet Generic drug: rivaroxaban   TAKE 1 TABLET BY MOUTH ONCE  DAILY WITH A MEAL Refills: 0       Unreviewed Medications      Details  * DEXCOM G7 SENSOR Devi  USE 1 EACH EVERY 10 DAYS Quantity: 9 each Refills: 3   * FREESTYLE LIBRE 3 PLUS SENSOR Generic drug: FREESTYLE LIBRE 3 PLUS SENSOR  1 each, Misc.(Non-Drug; Combo Route), Every 15 days Quantity: 6 each Refills: 3      * * There are duplicate medications prescribed to the patient           ARNI/ACE/ARB: Prescribed SGLT2i: Prescribed Beta Blocker: Prescribed MRA: Prescribed Hydralazine /Nitrates: Not indicated   Brief History of Present Illness: Per the H&P dated on 11/14/2024:   Andres [name] is a 83 y.o. male with PMHx of A fib (Xarelto  and Amio), VT (s/p AICD), CAD, HFrEF (EF 20-30%  06/12/24), CKD stage 3b, HLD, HTN, hypothyroidism, OSA on CPAP, OA, PMR, and T2DM who presented to the ED with SOB.   [name] reports that for the last 2-3 months he has felt like his home dose of lasix  20mg  PO has not been enough to control his volume status. States that he slowly started noticing LE swelling. Around 2-3 wks ago he developed orthopnea but only 1 pillow. He went to OSH ED and was treated in the ED with lasix . While the night he stayed in the ED he felt better and was discharged, there was an issue getting higher lasix  dose w/ PCP and  he was told that he needed to see a cardiologist. In the time he was waiting to see them, he re-developed his orthopnea. He has an appointment next Friday with the cardiologist   Denies fevers, chils, sick contacts, HA, changes in vision or hearing, dizziness, lightheadedness, LOC, falls, CP, abd pain, N/V/D/C, urinary retention, or burning or pain with urination. He has not noticed more SOB with bending forward, extremity weakness, or SOB at rest. He said that he could make a lap around the 7300 unit before he would feel SOB.   He reports that he is pretty consistent with his medications. He has a pill box and written out schedule to avoid missing doses, including GDMT, lasix , and xarelto . Of note, he recently started amiodarone  within the last month.    Last BM was day of admission. No current smoking or alcohol use. Uses a cane at night at home.    On presentation to the ED, vitals were T 36.4 C (97.5 F), HR 52, BP 129/80, RR 18, and satting 97 % on room air. Labs notable for Na 141, K 5.0, HCO3 26, BUN 37*, sCr 2.4*. CBC notable for WBC 6.8, Hgb 14.6, plts 244. Pro BNP 3,802 with no prior. Trops negative. EKG unchanged from prior. CXR shows mild edema in acute setting with no focal consolidation, pleural effusion, or pneumothorax. He received IV 40mg  Lasix  in the ED and per patient fill up half a urinal with urine after that dose. He was then admitted  to medicine for further evaluation. _____________________  Hospital Course by Problem:  # Acute on Chronic HFrEF (20-30% 06/12/24) # NICM  # HTN Presented with subacute dyspnea. TTE (11/15/24) EF 20%, G3DD, moderate TR. Stable from baseline EF of 20%. Treated with IV diuresis, and once euvolemic, transitioned to PO furosemide  40 mg at discharge, increased from home 20 mg. GDMT at discharge includes new Farxiga, and continued home Entresto , spironolactone , and metoprolol . Weight at discharge is 55.38 kg.    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 Continue home rosuvastatin  20 mg   # VT s/p [employer] ICD #Afib Several hospitalizations due to VT, most recently July with syncope that correlated with VT, at that time he was started on mexiletine and noted to be hypothyroid for which he was resumed on synthroid .  Continue home amiodarone  and mexiletine.Continue xarelto  15mg  daily and home metoprolol .    #Type 2 DM Last A1c 8.4% (11/11/24). Ozempic was prescribed but too expensive and not covered by his insurance. Regimen at home: lispro 40, 30, and 20 with meals in addition to Tresiba  90u. Started on SGLT2i this admission.    #AKI #CKD stage 3b Baseline Cr has been climbing. In 2023 the new baseline was between 1.6-1.9. Cr on admission 2.4 and discharge Cr was 2.5. AKI most likely secondary to cardiorenal in etiology. Patient to undergo BMP at follow up.    #Hypothyroidism Continue home synthroid     Morbid Obesity (BMI 40+): His body mass index is 55.38 kg/m. This diagnosis affected the hospital stay due to increased nursing care, therapy services, and different equipment needed for care.  Social Drivers of Health with Concerns   Tobacco Use: Medium Risk (11/12/2024)   Received from [facility]   Patient History    Smoking Tobacco Use: Former    Smokeless Tobacco  Use: Never  Social Connections: Unknown (06/12/2024)   Received from [facility]  Social Connection and Isolation Panel    In a typical week, how many times do you talk on the phone with family, friends, or neighbors?: Once a week    How often do you get together with friends or relatives?: Three times a week    How often do you attend church or religious services?: 1 to 4 times per year    Do you belong to any clubs or organizations such as church groups, unions, fraternal or athletic groups, or school groups?: Patient declined    How often do you attend meetings of the clubs or organizations you belong to?: Patient declined    Are you married, widowed, divorced, separated, never married, or living with a partner?: Married   Comorbid Conditions:         Imaging and Procedures Performed:   CXR 11/17/24:  FINDINGS/IMPRESSION:   Left subclavian AICD lead unchanged projecting over the right ventricle.   Lungs and pleura are unchanged with mild linear bibasilar atelectasis/scar but otherwise clear.   No pleural effusion or pneumothorax.   Enlarged cardiac silhouette is unchanged status post median sternotomy. _____________________  Discharge Exam:  Admission Weight: 94.1 kg (207 lb 8 oz)  Discharge Weight: Weight: 90.3 kg (199 lb 1.6 oz) BMI: Body mass index is 55.38 kg/m. BP 104/63 (BP Location: Left upper arm, Patient Position: Sitting)   Pulse 58   Temp 36.7 C (98.1 F) (Oral)   Resp 18   Ht 127.7 cm (4' 2.28)   Wt 90.3 kg (199 lb 1.6 oz)   SpO2 99%   BMI 55.38 kg/m   General: alert, cooperative, obese, and in NAD Respiratory: regular rate, symmetric, unlabored, clear to auscultation bilaterally, and no accessory muscle use Cardiac: regular rate, regular rhythm, S1, S2 present, no murmur, no rub, and no gallop Abdomen: normal bowel sounds, soft, nontender, and nondistended Extremities: extremities warm and well perfused, no clubbing or cyanosis, no edema, and  distal pulses intact Lines: none  Pertinent Lab Testing:  BMP: Recent Labs  Lab 11/18/24 0509  NA 140  K 4.2  CL 104  CO2 25  BUN 43*  CREATININE 2.5*  GLUCOSE 190*  CALCIUM  9.0  MG 2.1   CBC: Recent Labs  Lab 11/18/24 0509  WBC 7.5  HGB 14.6  HCT 45.1  PLT 196   INR: No results for input(s): INR in the last 168 hours.  TFTs: Recent Labs  Lab 11/14/24 1249  TSH 5.44  T4FREE 1.04         Other Pertinent Labs: None  _____________________  Time spent on discharge process:  >30 minutes  ORELIA FLATTEN, PA   ------------------------------------------------------------------------------- Attestation signed by Arlice Perish, MD at 11/18/2024  3:55 PM Advanced Heart Failure Attending Attestation Statement  Attestation Statement:   I personally saw the patient and performed a substantive portion of the medical decision making, in conjunction with the Advanced Practice Provider for the condition/treatment.  PERISH ARLICE, MD   Assessment/Plan:  Mr. Haywood is a 83 y.o. with HFrEF (EF 20-25%), CABG (SVG-->OMG after failed LCx angioplasty in 1994, subsequently occluded), AF (Xarelto  and Amio), multiple episodes of VT s/p ICD, CKD stage 3b, OSA on CPAP, PMR, and T2DM who presented to the ED with SOB. Subacute worsening, taking 20 mg lasix  at home. Improved significantly and feels well.   HFrEF (largely NICM), SOB:  Lasix  40 mg QD. Recent LHC July with no obstructive disease. TTE with EF 20%, mod TR, mild RVD. Last office weight 211, now  199 on discharge. Home entresto ,metop, eplerenone , and dapa (empa stopped due to dizziness before and will surveil outpatient for tolerance). CXR ok, RVP neg. No known COPD/asthma (can get PFTs outpatient, former smoker). Consider cardiomems outpatient. 2.   AF, VT: amio, mex, xarelto . Device interrogation without VT. Watch HR on BB, has ICD backup pacing.   3.   AoCKD:  Cr baseline ~2, a little higher with SGLT2  initiation and getting dry. Will need repeat BMP in 1 week, has op nephrologist as well. 4.   1V CABG, angiogplasty Lcx c/b dissection:  on rosuvastatin , NOAC  With new dapa initiation,will need to f/up on sugars given he is on insulin  (please check in on this at next appt 12/26). His sugars were if anything high while here in the hospital.   Signed:       Linwood Fruit, MD, MS, MA Advanced Heart Failure and Transplant Attending       Supplemental findings:    AMIOdarone   200 mg Oral BID   azelastine   1 spray Both Nares BID   coenzyme Q10  30 mg Oral Daily with breakfast   dapagliflozin propanediol  10 mg Oral Daily   eplerenone   25 mg Oral Daily   fluticasone propionate  2 spray Both Nares Daily   FUROsemide   40 mg Oral Daily   insulin  LISPRO (AdmeLOG , HumaLOG ) injection  20 Units Subcutaneous Daily with dinner   insulin  LISPRO (AdmeLOG , HumaLOG ) injection  30 Units Subcutaneous Daily with lunch   insulin  LISPRO (AdmeLOG , HumaLOG ) injection  40 Units Subcutaneous Daily with breakfast   levothyroxine   88 mcg Oral Daily before breakfast (0630)   melatonin  9 mg Oral QHS   metoprolol  SUCCinate  25 mg Oral QHS   polyethylene glycol  17 g Oral Daily   rivaroxaban   15 mg Oral Daily with breakfast   rosuvastatin   20 mg Oral Daily   sacubitriL -valsartan   1 tablet Oral Q12H SCH   sennosides-docusate  2 tablet Oral BID     Hemodynamics are notable for: Temp:  [36.4 C (97.5 F)-36.7 C (98.1 F)] 36.7 C (98.1 F) Heart Rate:  [54-62] 58 Resp:  [16-18] 18 BP: (94-121)/(63-68) 104/63 Temp (24hrs), Avg:36.6 C (97.8 F), Min:36.4 C (97.5 F), Max:36.7 C (98.1 F)  SpO2: 99 % Weight: 90.3 kg (199 lb 1.6 oz)   Labs: CBC Recent Labs    11/16/24 0557 11/17/24 0406 11/18/24 0509  WBC 7.5 7.5 7.5  HGB 15.1 15.0 14.6  HCT 47.2 45.6 45.1  PLT 210 215 196  MCV 97 96 96  MCH 31.1 31.5 31.0  MCHC 32.0 32.9 32.4  RBC 4.86 4.76 4.71  RDWCV 16.5* 16.5*  16.5*  NRBC 0.00 0.00 0.00  NRBCPCT 0.0 0.0 0.0  MPV 11.9* 11.9* 11.9*   BMP   Recent Labs    11/16/24 0557 11/16/24 1716 11/17/24 0407 11/18/24 0509  NA 140 137 137 140  K 4.3 4.7 4.2 4.2  CL 101 102 102 104  CO2 24 21 25 25   BUN 29* 36* 36* 43*  CREATININE 2.0* 2.1* 2.3* 2.5*  GLUCOSE 191* 181* 178* 190*  CALCIUM  9.4 9.3 9.1 9.0  ANIONGAP 15* 14* 10 11  BUNCRE 15 17 15 17   GFR 32 30 27 25   MG 2.2  --  2.0 2.1    LFLT Recent Labs    02/27/23 0916 10/07/24 1027 11/14/24 1249  TOTALPROTEIN 6.9 6.6 7.3  ALB 4.1 4.1 3.5  TBILI 0.7 0.7 1.6*  ALKPHOS  58 65 53  AST 21 32 36  ALT 16 32 26   NT-proBNP Recent Labs    11/14/24 1249  PROBNP 3,802*    TSH Recent Labs    02/27/23 0916 05/04/23 0922 11/14/24 1249  TSH 6.907* 4.929 5.44  T4FREE  --   --  1.04   Ferritin,iron, TIBC, iron saturation No results for input(s): IRON, TIBC, PCTSAT in the last 73719 hours.  Invalid input(s): IFERRITIN  A1C Recent Labs    03/27/24 0906 07/01/24 0901 11/11/24 0836  HGBA1C 7.9* 7.0* 8.4*  AVGBLDGLUC 180 154 194   LIPID PANEL Recent Labs    11/14/24 1249  CHOLTOTAL 145  LDLCALC 86  HDL 31  TRIG 139     ------------------------------------------------------------------------------- *Some images could not be shown."

## 2024-11-19 LAB — CUP PACEART REMOTE DEVICE CHECK
Battery Remaining Longevity: 44 mo
Battery Remaining Percentage: 44 %
Battery Voltage: 2.93 V
Brady Statistic RV Percent Paced: 8.9 %
Date Time Interrogation Session: 20251223203231
HighPow Impedance: 86 Ohm
HighPow Impedance: 86 Ohm
Implantable Lead Connection Status: 753985
Implantable Lead Implant Date: 20130403
Implantable Lead Location: 753860
Implantable Pulse Generator Implant Date: 20190422
Lead Channel Impedance Value: 390 Ohm
Lead Channel Pacing Threshold Amplitude: 1.25 V
Lead Channel Pacing Threshold Pulse Width: 0.5 ms
Lead Channel Sensing Intrinsic Amplitude: 11.7 mV
Lead Channel Setting Pacing Amplitude: 2.5 V
Lead Channel Setting Pacing Pulse Width: 0.5 ms
Lead Channel Setting Sensing Sensitivity: 0.5 mV
Pulse Gen Serial Number: 9811176
Zone Setting Status: 755011

## 2024-11-21 ENCOUNTER — Ambulatory Visit: Payer: Self-pay | Admitting: Medical

## 2024-11-21 ENCOUNTER — Encounter: Payer: Self-pay | Admitting: Medical

## 2024-11-21 VITALS — BP 112/64 | HR 60 | Ht 68.0 in | Wt 205.6 lb

## 2024-11-21 DIAGNOSIS — I5022 Chronic systolic (congestive) heart failure: Secondary | ICD-10-CM

## 2024-11-21 DIAGNOSIS — I2510 Atherosclerotic heart disease of native coronary artery without angina pectoris: Secondary | ICD-10-CM | POA: Diagnosis not present

## 2024-11-21 DIAGNOSIS — Z95810 Presence of automatic (implantable) cardiac defibrillator: Secondary | ICD-10-CM

## 2024-11-21 DIAGNOSIS — E782 Mixed hyperlipidemia: Secondary | ICD-10-CM | POA: Diagnosis not present

## 2024-11-21 DIAGNOSIS — I4821 Permanent atrial fibrillation: Secondary | ICD-10-CM | POA: Diagnosis not present

## 2024-11-21 DIAGNOSIS — I472 Ventricular tachycardia, unspecified: Secondary | ICD-10-CM | POA: Diagnosis not present

## 2024-11-21 MED ORDER — POTASSIUM CHLORIDE CRYS ER 20 MEQ PO TBCR: 20.0000 meq | EXTENDED_RELEASE_TABLET | Freq: Every day | ORAL | 3 refills | Status: AC

## 2024-11-21 MED ORDER — AMIODARONE HCL 200 MG PO TABS: 200.0000 mg | ORAL_TABLET | Freq: Every day | ORAL | 3 refills | Status: AC

## 2024-11-21 NOTE — Progress Notes (Signed)
 Remote ICD Transmission

## 2024-11-21 NOTE — Patient Instructions (Signed)
 Medication Instructions:  Your physician recommends the following medication changes.  START TAKING: Potassium 20 meq by mouth daily   DECREASE: Amiodarone  to 200 mg by mouth daily   *If you need a refill on your cardiac medications before your next appointment, please call your pharmacy*  Lab Work: No labs ordered today    Testing/Procedures: No test ordered today   Follow-Up: At [facility], you and your health needs are our priority.  As part of our continuing mission to provide you with exceptional heart care, our providers are all part of one team.  This team includes your primary Cardiologist (physician) and Advanced Practice Providers or APPs (Physician Assistants and Nurse Practitioners) who all work together to provide you with the care you need, when you need it.  Your next appointment:   3 month(s)  Provider:   Deatrice Cage, MD or Cadence Franchester, PA-C

## 2024-11-21 NOTE — Progress Notes (Signed)
 " Cardiology Office Note   Date:  11/21/2024  ID:  Andres LITTIE Gobble Sr., DOB [birth year], MRN [id number] PCP: [name], MD  Andres [name] Cardiologist:  Deatrice Cage, MD Electrophysiologist:  [name], MD  Electrophysiology APP:  Riddle, Suzann, NP   History of Present Illness Andres LITTIE Gobble Sr. is a 83 y.o. male with a h/o chronic systolic heart failure, chronic Afib, VT s/p ICD, and CAD s/p remote CABG who presents for follow-up.   Patient had previously one-vessel CABG in 1994 after failed angioplasty of the left circumflex complicated by dissection.  Cardiac cath in December 2016 showed occluded SVG to OM 3.  The mid left circumflex had diffuse 90% disease and to a small OM 3 which was also diffusely diseased and relatively small in size.  EF was 15% with mildly elevated LVEDP.  I recommended medical therapy.  He had previous ventricular tachycardia and syncope that was successfully treated with ICD shock with subsequent adjustment of his device to deliver ATP.  He had an ICD generator replacement in April 2019.  He was hospitalized in June 2021 with syncope and was found to have ventricular tachycardia treated with ATP.  CT scan showed subdural hematoma which necessitated holding Xarelto  at that time.  He had recurrent ventricular tachycardia in early 2022 and underwent cardiac cath February 2022 which showed significant underlying one-vessel CAD affecting the mid to distal left circumflex into OM 3 with chronically occluded SVG to OM 3 and stable moderate mid LAD disease.  Overall, no significant change from the most recent cath in 2016.  LVEDP was normal.  He was treated with amiodarone .  He was hospitalized in July with syncope that correlated with VT.  He was started on IV amiodarone  and mexiletine.  Cardiac cath showed no significant changes in anatomy with no obstructive disease.  He was resumed on Synthroid .  He had issues with hypotension that  required holding Entresto  and eplerenone , but these medications were subsequently restarted.  Patient was admitted late December 2025 at [facility] for acute heart failure.  Echo showed EF 20%, grade 3 diastolic dysfunction, moderate TR.  Patient was treated with IV diuresis he discharged on Lasix  40 mg daily.  He was started on Farxiga 10 mg daily.  Today, the patient is overall Ok since being at home. He feels breathing isnot much different than when he went into the hospital. He denies lower leg edema. He has orthopnea as well.   Studies Reviewed      Echo 10/2024 CONCLUSION -------------------------------------------------------------------------------  SEVERE LEFT VENTRICULAR SYSTOLIC DYSFUNCTION WITH NO LVH, LV SIZE IS MILDLY ENLARGED  ESTIMATED EF: 20%  ELEVATED LA PRESSURES WITH DIASTOLIC DYSFUNCTION (GRADE 3)  MILD RIGHT VENTRICULAR SYSTOLIC DYSFUNCTION  VALVULAR REGURGITATION: No AR, MILD MR, No PR, MODERATE TR           LHC 05/2024 Conclusions: Stable severe single vessel coronary artery disease involving distal LCx/OM3, similar to last catheterization.  Non-obstructive disease noted in LAD and RCA. Chronically occluded SVG-OM3. Normal left ventricular filling pressure (LVEDP 10 mmHg).   Recommendations: Continue secondary prevention of coronary artery disease. Continue GDMT for chronic HFrEF, as tolerated. 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   Lonni Hanson, MD [facility]     Recommendations  Antiplatelet/Anticoag Recommend to resume Rivaroxaban , at currently prescribed dose and frequency on 06/13/2024. Concurrent antiplatelet therapy not recommended.  Discharge Date Anticipated discharge date to be determined.   Echo 05/2024 1. Left ventricular ejection fraction,  by estimation, is 20 to 25%. Left  ventricular ejection fraction by 2D MOD biplane is 27.2 %. The left  ventricle has severely decreased function. The left ventricle demonstrates  global hypokinesis.  The left  ventricular internal cavity size was moderately dilated. Left ventricular  diastolic parameters are indeterminate.   2. Right ventricular systolic function is normal. The right ventricular  size is mildly enlarged. There is normal pulmonary artery systolic  pressure. The estimated right ventricular systolic pressure is 20.4 mmHg.   3. Left atrial size was moderately dilated.   4. Right atrial size was mildly dilated.   5. The mitral valve is normal in structure. No evidence of mitral valve  regurgitation. No evidence of mitral stenosis.   6. Tricuspid valve regurgitation is mild to moderate.   7. The aortic valve is normal in structure. Aortic valve regurgitation is  not visualized. No aortic stenosis is present.   8. The inferior vena cava is normal in size with greater than 50%  respiratory variability, suggesting right atrial pressure of 3 mmHg.         Physical Exam VS:  BP 112/64   Pulse 60   Ht 5' 8 (1.727 m)   Wt 205 lb 9.6 oz (93.3 kg)   SpO2 91%   BMI 31.26 kg/m        Wt Readings from Last 3 Encounters:  11/21/24 205 lb 9.6 oz (93.3 kg)  11/12/24 214 lb 11.7 oz (97.4 kg)  10/02/24 211 lb 2 oz (95.8 kg)    GEN: Well nourished, well developed in no acute distress NECK: No JVD; No carotid bruits CARDIAC: RRR, no murmurs, rubs, gallops RESPIRATORY:  Clear to auscultation without rales, wheezing or rhonchi  ABDOMEN: Soft, non-tender, non-distended EXTREMITIES:  No edema; No deformity   ASSESSMENT AND PLAN  Chronic systolic heart failure Recent admission for acute heart failure. Echo showed LVEF 20%. He was sent home on lasix  40mg  daily. He appears euvolemic on exam, but reports DOE and orthopnea. We will continue lasix  40mg  daily. I will add klor-con  20meq. Continue Toprol , Entresto , Farxiga and eplerenone   CAD The patient denies chest pain. Cardiac cath earlier this year showed stable coronary anatomy. Continue statin and BB therapy. No ASA given  DOAC.  Permanent Afib Continue Toprol  25mg  daily for rate control. Continue Xarelto  15mg  daily.  DM2 Most recent A1C 8.4. management per IM.   HLD LDL 86. Continue Crestor  20mg  daily.   Recurrent VT S/p ICD Requested decrease of amiodarone  due to possible ear issues. I will decrease amiodarone  to 200mg  daily. Continue Mexiletine 150mg BID.        Dispo: follow-up in 3 months  Signed, Delanda Bulluck VEAR Fishman, PA-C   "

## 2024-11-22 ENCOUNTER — Ambulatory Visit: Payer: Self-pay | Admitting: Cardiology

## 2024-11-26 ENCOUNTER — Other Ambulatory Visit: Payer: Self-pay | Admitting: *Deleted

## 2024-11-28 ENCOUNTER — Other Ambulatory Visit (HOSPITAL_COMMUNITY): Payer: Self-pay

## 2024-11-28 MED ORDER — FREESTYLE LIBRE 3 PLUS SENSOR MISC: 3 refills | Status: AC

## 2024-11-28 MED ORDER — DEXCOM G7 SENSOR MISC: 3 refills | Status: AC

## 2024-12-02 ENCOUNTER — Other Ambulatory Visit (HOSPITAL_COMMUNITY): Payer: Self-pay

## 2024-12-06 ENCOUNTER — Other Ambulatory Visit: Payer: Self-pay | Admitting: Obstetrics and Gynecology

## 2024-12-12 ENCOUNTER — Other Ambulatory Visit: Payer: Self-pay

## 2024-12-12 ENCOUNTER — Other Ambulatory Visit (HOSPITAL_COMMUNITY): Payer: Self-pay

## 2024-12-12 MED ORDER — SACUBITRIL-VALSARTAN 24-26 MG PO TABS
1.0000 | ORAL_TABLET | Freq: Two times a day (BID) | ORAL | 2 refills | Status: AC
  Filled 2024-12-12: qty 60, 30d supply, fill #0
  Filled 2025-01-02: qty 60, 30d supply, fill #1
  Filled ????-??-??: fill #1

## 2025-01-02 ENCOUNTER — Other Ambulatory Visit (HOSPITAL_COMMUNITY): Payer: Self-pay

## 2025-02-17 ENCOUNTER — Ambulatory Visit: Admitting: Dermatology

## 2025-02-19 ENCOUNTER — Ambulatory Visit: Admitting: Cardiovascular Disease
